# Patient Record
Sex: Female | Born: 1945 | Race: White | Hispanic: No | Smoking: Former smoker
Health system: Southern US, Community
[De-identification: ages and names within clinical notes are randomized; demographics above are authoritative.]

## PROBLEM LIST (undated history)

## (undated) DIAGNOSIS — I2510 Atherosclerotic heart disease of native coronary artery without angina pectoris: Secondary | ICD-10-CM

## (undated) DIAGNOSIS — I9789 Other postprocedural complications and disorders of the circulatory system, not elsewhere classified: Secondary | ICD-10-CM

## (undated) DIAGNOSIS — J449 Chronic obstructive pulmonary disease, unspecified: Secondary | ICD-10-CM

## (undated) DIAGNOSIS — E785 Hyperlipidemia, unspecified: Secondary | ICD-10-CM

## (undated) DIAGNOSIS — I4891 Unspecified atrial fibrillation: Secondary | ICD-10-CM

## (undated) DIAGNOSIS — M069 Rheumatoid arthritis, unspecified: Secondary | ICD-10-CM

## (undated) DIAGNOSIS — C4490 Unspecified malignant neoplasm of skin, unspecified: Secondary | ICD-10-CM

## (undated) DIAGNOSIS — K219 Gastro-esophageal reflux disease without esophagitis: Secondary | ICD-10-CM

## (undated) DIAGNOSIS — C3491 Malignant neoplasm of unspecified part of right bronchus or lung: Secondary | ICD-10-CM

## (undated) DIAGNOSIS — H269 Unspecified cataract: Secondary | ICD-10-CM

## (undated) DIAGNOSIS — C50919 Malignant neoplasm of unspecified site of unspecified female breast: Secondary | ICD-10-CM

## (undated) HISTORY — DX: Unspecified malignant neoplasm of skin, unspecified: C44.90

## (undated) HISTORY — PX: TONSILLECTOMY: SHX5217

## (undated) HISTORY — PX: CHOLECYSTECTOMY: SHX55

## (undated) HISTORY — DX: Hyperlipidemia, unspecified: E78.5

## (undated) HISTORY — DX: Unspecified cataract: H26.9

## (undated) HISTORY — DX: Malignant neoplasm of unspecified part of right bronchus or lung: C34.91

## (undated) HISTORY — DX: Chronic obstructive pulmonary disease, unspecified: J44.9

## (undated) HISTORY — DX: Malignant neoplasm of unspecified site of unspecified female breast: C50.919

## (undated) HISTORY — DX: Gastro-esophageal reflux disease without esophagitis: K21.9

## (undated) HISTORY — DX: Rheumatoid arthritis, unspecified: M06.9

## (undated) HISTORY — PX: CATARACT EXTRACTION W/ INTRAOCULAR LENS  IMPLANT, BILATERAL: SHX1307

## (undated) HISTORY — DX: Other postprocedural complications and disorders of the circulatory system, not elsewhere classified: I97.89

## (undated) HISTORY — DX: Unspecified atrial fibrillation: I48.91

## (undated) HISTORY — PX: TRANSTHORACIC ECHOCARDIOGRAM: SHX275

---

## 1997-02-28 DIAGNOSIS — C50919 Malignant neoplasm of unspecified site of unspecified female breast: Secondary | ICD-10-CM

## 1997-02-28 HISTORY — DX: Malignant neoplasm of unspecified site of unspecified female breast: C50.919

## 1997-02-28 HISTORY — PX: BREAST LUMPECTOMY: SHX2

## 1997-06-24 ENCOUNTER — Inpatient Hospital Stay (HOSPITAL_COMMUNITY): Admission: EM | Admit: 1997-06-24 | Discharge: 1997-06-30 | Payer: Self-pay | Admitting: Emergency Medicine

## 1997-07-02 ENCOUNTER — Ambulatory Visit (HOSPITAL_COMMUNITY): Admission: RE | Admit: 1997-07-02 | Discharge: 1997-07-02 | Payer: Self-pay | Admitting: General Surgery

## 1997-07-04 ENCOUNTER — Inpatient Hospital Stay (HOSPITAL_COMMUNITY): Admission: AD | Admit: 1997-07-04 | Discharge: 1997-07-05 | Payer: Self-pay

## 1997-07-10 ENCOUNTER — Ambulatory Visit (HOSPITAL_COMMUNITY): Admission: RE | Admit: 1997-07-10 | Discharge: 1997-07-10 | Payer: Self-pay | Admitting: General Surgery

## 1997-07-14 ENCOUNTER — Other Ambulatory Visit: Admission: RE | Admit: 1997-07-14 | Discharge: 1997-07-14 | Payer: Self-pay | Admitting: General Surgery

## 1997-08-13 ENCOUNTER — Ambulatory Visit (HOSPITAL_COMMUNITY): Admission: RE | Admit: 1997-08-13 | Discharge: 1997-08-13 | Payer: Self-pay | Admitting: Gastroenterology

## 1997-10-06 ENCOUNTER — Other Ambulatory Visit: Admission: RE | Admit: 1997-10-06 | Discharge: 1997-10-06 | Payer: Self-pay | Admitting: Radiology

## 1997-10-14 ENCOUNTER — Ambulatory Visit (HOSPITAL_COMMUNITY): Admission: RE | Admit: 1997-10-14 | Discharge: 1997-10-15 | Payer: Self-pay | Admitting: General Surgery

## 1997-10-14 ENCOUNTER — Encounter: Payer: Self-pay | Admitting: General Surgery

## 1997-10-20 ENCOUNTER — Encounter: Admission: RE | Admit: 1997-10-20 | Discharge: 1998-01-18 | Payer: Self-pay | Admitting: Radiation Oncology

## 1999-02-02 ENCOUNTER — Ambulatory Visit (HOSPITAL_COMMUNITY): Admission: RE | Admit: 1999-02-02 | Discharge: 1999-02-02 | Payer: Self-pay | Admitting: Gastroenterology

## 1999-02-17 ENCOUNTER — Other Ambulatory Visit: Admission: RE | Admit: 1999-02-17 | Discharge: 1999-02-17 | Payer: Self-pay | Admitting: Gynecology

## 1999-02-18 ENCOUNTER — Other Ambulatory Visit: Admission: RE | Admit: 1999-02-18 | Discharge: 1999-02-18 | Payer: Self-pay | Admitting: Gynecology

## 1999-05-04 ENCOUNTER — Other Ambulatory Visit: Admission: RE | Admit: 1999-05-04 | Discharge: 1999-05-04 | Payer: Self-pay | Admitting: Gynecology

## 2000-03-28 ENCOUNTER — Other Ambulatory Visit: Admission: RE | Admit: 2000-03-28 | Discharge: 2000-03-28 | Payer: Self-pay | Admitting: Gynecology

## 2000-04-10 ENCOUNTER — Encounter: Admission: RE | Admit: 2000-04-10 | Discharge: 2000-04-10 | Payer: Self-pay | Admitting: Oncology

## 2000-04-10 ENCOUNTER — Encounter: Payer: Self-pay | Admitting: Oncology

## 2000-04-13 ENCOUNTER — Encounter: Admission: RE | Admit: 2000-04-13 | Discharge: 2000-04-13 | Payer: Self-pay | Admitting: Oncology

## 2000-04-13 ENCOUNTER — Encounter: Payer: Self-pay | Admitting: Oncology

## 2000-04-17 ENCOUNTER — Ambulatory Visit (HOSPITAL_COMMUNITY): Admission: RE | Admit: 2000-04-17 | Discharge: 2000-04-17 | Payer: Self-pay | Admitting: Oncology

## 2000-04-17 ENCOUNTER — Encounter: Payer: Self-pay | Admitting: Oncology

## 2000-04-26 ENCOUNTER — Encounter: Admission: RE | Admit: 2000-04-26 | Discharge: 2000-06-09 | Payer: Self-pay | Admitting: Neurosurgery

## 2001-05-25 ENCOUNTER — Emergency Department (HOSPITAL_COMMUNITY): Admission: EM | Admit: 2001-05-25 | Discharge: 2001-05-25 | Payer: Self-pay | Admitting: Emergency Medicine

## 2001-06-20 ENCOUNTER — Other Ambulatory Visit: Admission: RE | Admit: 2001-06-20 | Discharge: 2001-06-20 | Payer: Self-pay | Admitting: Gynecology

## 2001-06-25 ENCOUNTER — Other Ambulatory Visit: Admission: RE | Admit: 2001-06-25 | Discharge: 2001-06-25 | Payer: Self-pay | Admitting: Radiology

## 2002-08-24 ENCOUNTER — Emergency Department (HOSPITAL_COMMUNITY): Admission: AD | Admit: 2002-08-24 | Discharge: 2002-08-24 | Payer: Self-pay | Admitting: Emergency Medicine

## 2002-08-24 ENCOUNTER — Encounter: Payer: Self-pay | Admitting: Emergency Medicine

## 2004-02-19 ENCOUNTER — Emergency Department (HOSPITAL_COMMUNITY): Admission: EM | Admit: 2004-02-19 | Discharge: 2004-02-19 | Payer: Self-pay | Admitting: Emergency Medicine

## 2005-02-08 ENCOUNTER — Other Ambulatory Visit: Admission: RE | Admit: 2005-02-08 | Discharge: 2005-02-08 | Payer: Self-pay | Admitting: Gynecology

## 2006-02-05 ENCOUNTER — Emergency Department: Payer: Self-pay | Admitting: Emergency Medicine

## 2006-03-17 ENCOUNTER — Ambulatory Visit (HOSPITAL_COMMUNITY): Admission: AD | Admit: 2006-03-17 | Discharge: 2006-03-17 | Payer: Self-pay | Admitting: Gynecology

## 2006-03-17 ENCOUNTER — Encounter (INDEPENDENT_AMBULATORY_CARE_PROVIDER_SITE_OTHER): Payer: Self-pay | Admitting: Specialist

## 2006-04-27 ENCOUNTER — Other Ambulatory Visit: Admission: RE | Admit: 2006-04-27 | Discharge: 2006-04-27 | Payer: Self-pay | Admitting: Gynecology

## 2008-02-08 ENCOUNTER — Ambulatory Visit: Payer: Self-pay | Admitting: Family Medicine

## 2008-02-08 DIAGNOSIS — C50919 Malignant neoplasm of unspecified site of unspecified female breast: Secondary | ICD-10-CM | POA: Insufficient documentation

## 2008-02-08 DIAGNOSIS — M069 Rheumatoid arthritis, unspecified: Secondary | ICD-10-CM | POA: Insufficient documentation

## 2008-02-08 DIAGNOSIS — R5381 Other malaise: Secondary | ICD-10-CM | POA: Insufficient documentation

## 2008-02-08 DIAGNOSIS — E785 Hyperlipidemia, unspecified: Secondary | ICD-10-CM | POA: Insufficient documentation

## 2008-02-08 DIAGNOSIS — F17200 Nicotine dependence, unspecified, uncomplicated: Secondary | ICD-10-CM | POA: Insufficient documentation

## 2008-02-08 DIAGNOSIS — R5383 Other fatigue: Secondary | ICD-10-CM

## 2008-02-08 DIAGNOSIS — R0789 Other chest pain: Secondary | ICD-10-CM | POA: Insufficient documentation

## 2008-02-08 LAB — HM SIGMOIDOSCOPY

## 2008-02-13 DIAGNOSIS — E559 Vitamin D deficiency, unspecified: Secondary | ICD-10-CM | POA: Insufficient documentation

## 2008-03-07 ENCOUNTER — Encounter: Payer: Self-pay | Admitting: Family Medicine

## 2008-03-07 ENCOUNTER — Ambulatory Visit: Payer: Self-pay | Admitting: Family Medicine

## 2008-03-07 ENCOUNTER — Other Ambulatory Visit: Admission: RE | Admit: 2008-03-07 | Discharge: 2008-03-07 | Payer: Self-pay | Admitting: Family Medicine

## 2008-03-07 LAB — HM PAP SMEAR

## 2008-03-10 LAB — CONVERTED CEMR LAB: Vit D, 1,25-Dihydroxy: 28 — ABNORMAL LOW (ref 30–89)

## 2008-03-11 ENCOUNTER — Encounter (INDEPENDENT_AMBULATORY_CARE_PROVIDER_SITE_OTHER): Payer: Self-pay | Admitting: *Deleted

## 2008-05-14 ENCOUNTER — Encounter (INDEPENDENT_AMBULATORY_CARE_PROVIDER_SITE_OTHER): Payer: Self-pay | Admitting: Internal Medicine

## 2008-05-14 ENCOUNTER — Ambulatory Visit: Payer: Self-pay | Admitting: Family Medicine

## 2008-05-14 DIAGNOSIS — R062 Wheezing: Secondary | ICD-10-CM | POA: Insufficient documentation

## 2008-05-16 ENCOUNTER — Ambulatory Visit: Payer: Self-pay | Admitting: Family Medicine

## 2008-05-22 ENCOUNTER — Ambulatory Visit: Payer: Self-pay | Admitting: Family Medicine

## 2008-12-17 ENCOUNTER — Encounter: Payer: Self-pay | Admitting: Family Medicine

## 2008-12-17 ENCOUNTER — Ambulatory Visit: Payer: Self-pay | Admitting: Family Medicine

## 2008-12-17 DIAGNOSIS — R631 Polydipsia: Secondary | ICD-10-CM | POA: Insufficient documentation

## 2008-12-19 LAB — CONVERTED CEMR LAB
BUN: 11 mg/dL (ref 6–23)
CO2: 30 meq/L (ref 19–32)
Calcium: 8.7 mg/dL (ref 8.4–10.5)
Chloride: 105 meq/L (ref 96–112)
Creatinine, Ser: 0.6 mg/dL (ref 0.4–1.2)

## 2010-03-02 ENCOUNTER — Ambulatory Visit
Admission: RE | Admit: 2010-03-02 | Discharge: 2010-03-02 | Payer: Self-pay | Source: Home / Self Care | Attending: Family Medicine | Admitting: Family Medicine

## 2010-03-02 DIAGNOSIS — J019 Acute sinusitis, unspecified: Secondary | ICD-10-CM | POA: Insufficient documentation

## 2010-03-28 LAB — CONVERTED CEMR LAB
ALT: 19 units/L (ref 0–35)
AST: 23 units/L (ref 0–37)
Albumin: 3.9 g/dL (ref 3.5–5.2)
Alkaline Phosphatase: 59 units/L (ref 39–117)
BUN: 12 mg/dL (ref 6–23)
Basophils Absolute: 0.1 10*3/uL (ref 0.0–0.1)
Basophils Relative: 1 % (ref 0.0–3.0)
Bilirubin, Direct: 0.1 mg/dL (ref 0.0–0.3)
CO2: 29 meq/L (ref 19–32)
Calcium: 9.2 mg/dL (ref 8.4–10.5)
Chloride: 101 meq/L (ref 96–112)
Cholesterol: 211 mg/dL (ref 0–200)
Creatinine, Ser: 0.7 mg/dL (ref 0.4–1.2)
Direct LDL: 119.4 mg/dL
Eosinophils Absolute: 0.2 10*3/uL (ref 0.0–0.7)
Eosinophils Relative: 2.1 % (ref 0.0–5.0)
Folate: 10.3 ng/mL
GFR calc Af Amer: 109 mL/min
GFR calc non Af Amer: 90 mL/min
Glucose, Bld: 95 mg/dL (ref 70–99)
HCT: 42.5 % (ref 36.0–46.0)
HDL: 61.5 mg/dL (ref >39.0)
Hemoglobin: 15.1 g/dL — ABNORMAL HIGH (ref 12.0–15.0)
Lymphocytes Relative: 29 % (ref 12.0–46.0)
MCHC: 35.5 g/dL (ref 30.0–36.0)
MCV: 96.5 fL (ref 78.0–100.0)
Monocytes Absolute: 0.7 10*3/uL (ref 0.1–1.0)
Monocytes Relative: 7.7 % (ref 3.0–12.0)
Neutro Abs: 5.1 10*3/uL (ref 1.4–7.7)
Neutrophils Relative %: 60.2 % (ref 43.0–77.0)
Platelets: 284 10*3/uL (ref 150–400)
Potassium: 4.7 meq/L (ref 3.5–5.1)
RBC: 4.4 M/uL (ref 3.87–5.11)
RDW: 12.2 % (ref 11.5–14.6)
Sodium: 137 meq/L (ref 135–145)
TSH: 1.62 microintl units/mL (ref 0.35–5.50)
Total Bilirubin: 0.9 mg/dL (ref 0.3–1.2)
Total CHOL/HDL Ratio: 3.4
Total Protein: 6.6 g/dL (ref 6.0–8.3)
Triglycerides: 129 mg/dL (ref 0–149)
VLDL: 26 mg/dL (ref 0–40)
Vit D, 1,25-Dihydroxy: 15 — ABNORMAL LOW (ref 30–89)
Vitamin B-12: 498 pg/mL (ref 211–911)
WBC: 8.6 10*3/uL (ref 4.5–10.5)

## 2010-04-01 NOTE — Assessment & Plan Note (Signed)
Summary: CHEST CONGESTION.COUGH/RBH   Vital Signs:  Patient profile:   65 year old female Height:      66 inches Weight:      196.50 pounds BMI:     31.83 Temp:     97.7 degrees F oral Pulse rate:   80 / minute Pulse rhythm:   regular BP sitting:   130 / 68  (left arm) Cuff size:   large  Vitals Entered By: Delilah Shan CMA Emmalena Canny Dull) (March 02, 2010 3:04 PM) CC: Chest congestion, Back Pain   History of Present Illness: ST and knots noted in neck.  Cough.  Ear pain and ringing.  Everything going on for about 4 weeks.  "I did every OTC med known to man, but it isn't getting better."  Nighttime is worse for the patient.  Rhinorrhea.  Off advair.  Was not wheezing before recent illness.  Now with some mild shortness of breath that came on with the illness.  Occ sputum.  No fevers.  Prev used SABA, but hasn't had this recently.     Allergies: 1)  ! Penicillin 2)  ! Chantix  Past History:  Past Medical History: Last updated: 02/08/2008 Rheumatoid arthritis, well controlled on no medication. Hyperlipidemia  Social History: Last updated: 03/02/2010 Occupation:retail management Divorced 2 children: healthy Current Smoker 1/2 pack a day for 30 years Alcohol use-yes, wine 1 glass severa times a week Drug use-no Regular exercise-yes, walks 2 times a week Diet: fruits and veggies, avoids fast food  Social History: Occupation:retail management Divorced 2 children: healthy Current Smoker 1/2 pack a day for 30 years Alcohol use-yes, wine 1 glass severa times a week Drug use-no Regular exercise-yes, walks 2 times a week Diet: fruits and veggies, avoids fast food  Review of Systems       See HPI.  Otherwise negative.    Physical Exam  General:  GEN: nad, alert and oriented HEENT: mucous membranes moist, TM w/o erythema, nasal epithelium injected, OP with cobblestoning, frontal and max sinus tender to palpation bilaterally NECK: supple with shotty LA CV: rrr. PULM: ctab,  no inc wob, no wheeze ABD: soft, +bs EXT: no edema    Impression & Recommendations:  Problem # 1:  COPD (ICD-496) Start back on advair and SABA as needed.  D/w patient re: long term control vs. short term relief and she understood.  Can follow up with PMD.  d/w patient about tobacco.  The following medications were removed from the medication list:    Advair Diskus 100-50 Mcg/dose Misc (Fluticasone-salmeterol) .Marland Kitchen... 1 puff 2 times daily Her updated medication list for this problem includes:    Advair Diskus 250-50 Mcg/dose Aepb (Fluticasone-salmeterol) .Marland Kitchen... 1 puff two times a day and rinse after use    Ventolin Hfa 108 (90 Base) Mcg/act Aers (Albuterol sulfate) .Marland Kitchen... 2 puffs every 4 hours as needed for cough/wheeze  Problem # 2:  SINUSITIS - ACUTE-NOS (ICD-461.9) Start zmax and follow up as needed.  Supporitve tx o/w. Nontoxic.  She agrees . Her updated medication list for this problem includes:    Zithromax 250 Mg Tabs (Azithromycin) .Marland Kitchen... 2 by mouth today and then 1 by mouth once daily for 4 days.  Orders: Prescription Created Electronically [phone])  Complete Medication List: 1)  Multivitamins Tabs (Multiple vitamin) .... Take 1 tablet by mouth once a day 2)  Fish Oil Oil (Fish oil) .... Once daily 3)  Advair Diskus 250-50 Mcg/dose Aepb (Fluticasone-salmeterol) .Marland Kitchen.. 1 puff two times a day and  rinse after use 4)  Ventolin Hfa 108 (90 Base) Mcg/act Aers (Albuterol sulfate) .... 2 puffs every 4 hours as needed for cough/wheeze 5)  Zithromax 250 Mg Tabs (Azithromycin) .... 2 by mouth today and then 1 by mouth once daily for 4 days.  Patient Instructions: 1)  Get plenty of rest, drink lots of clear liquids, and use Tylenol or Ibuprofen for fever and comfort. Start the antibiotics today and use the inhalers as directed.  I would think about getting a mammogram.  You can talk with Dr. Ermalene Searing about this.   2)  Let us know if you aren't getting better.  Take care.   Prescriptions: ZITHROMAX 250 MG TABS (AZITHROMYCIN) 2 by mouth today and then 1 by mouth once daily for 4 days.  #6 x 0   Entered and Authorized by:   Crawford Givens MD   Signed by:   Crawford Givens MD on 03/02/2010   Method used:   Electronically to        [id number] (retail)       [address]       [place], [place]  [address]       Ph: [phone] or [phone]       Fax: [phone]   RxID:   [id number] VENTOLIN HFA 108 (90 BASE) MCG/ACT AERS (ALBUTEROL SULFATE) 2 puffs every 4 hours as needed for cough/wheeze  #1 x 5   Entered and Authorized by:   Crawford Givens MD   Signed by:   Crawford Givens MD on 03/02/2010   Method used:   [facility] to        [facility] (retail)       [address]       [place], [place]  [address]       Ph: [phone] or [phone]       Fax: [phone]   RxID:   [id number] ADVAIR DISKUS 250-50 MCG/DOSE AEPB (FLUTICASONE-SALMETEROL) 1 puff two times a day and rinse after use  #1 x 5   Entered and Authorized by:   Crawford Givens MD   Signed by:   Crawford Givens MD on 03/02/2010   Method used:   [facility]        [facility] (retail)       [address]       [place], [place]  [address]       Ph: [phone] or [phone]       Fax: [phone]   RxID:   [id number]    Orders Added: 1)  Prescription Created Electronically [G8553] 2)  Est. Patient Level III [99371]    Current Allergies (reviewed today): ! PENICILLIN ! CHANTIX

## 2010-07-16 NOTE — H&P (Signed)
Hannah Green, HELBLING NO.:  [id number]   MEDICAL RECORD NO.:  [id number]          PATIENT TYPE:  AMB   LOCATION:  [facility]                          [facility]ILITY:  [facility]   PHYSICIAN:  Juan H. Lily Peer, M.D.DATE OF BIRTH:  1945/10/15   DATE OF ADMISSION:  03/17/2006  DATE OF DISCHARGE:                              HISTORY & PHYSICAL   CHIEF COMPLAINT:  Post menopausal vaginal bleeding.   HISTORY:  The patient is a 65 year old, gravida 2, para 2, who presented  to the office today complaining of vaginal bleeding which started  Monday.  The patient had not been sexually active and had intercourse on  Monday and started to bleed and has been passing large blood clots since  that time.  On examination in the office, it appears she has a posterior  vaginal fornix laceration extending from the 3 o'clock to the 9 o'clock  position and it was evident that it was bleeding.  Bimanual examination:  Uterus upper limits of normal.  No palpable masses or tenderness.  The  patient does have a history of right breast lumpectomy, stage I invasive  ductal carcinoma, where she had radiation and had been on tamoxifen back  in 1999.  She is currently on no medication, except calcium with vitamin  D, and she has not eaten since early this morning.   PHYSICAL EXAMINATION:  VITAL SIGNS:  The patient weighs 186 pounds,  blood pressure 120/78.  HEENT:  Unremarkable.  NECK:  Supple.  Trachea midline.  No carotid bruits.  No thyromegaly.  LUNGS:  Clear to auscultation without rhonchi or wheezes.  HEART:  Regular rate and rhythm.  No murmurs or gallops.  BREAST:  Not done.  ABDOMEN:  Soft, nontender with rebound or guarding.  PELVIC:  Bartholin, urethral, and Skene within normal limits.  Vagina  and cervix, posterior vaginal wall laceration extending from the 3 to  the 9 o'clock position.  Bimanual examination unremarkable.   ALLERGIES:  THE PATIENT IS ALLERGIC TO PENICILLIN.   MEDICATIONS:   Calcium and vitamin D.   The patient is a smoker, one pack per week.  She does have a history of  arthritis and has had a history of right breast lumpectomy, stage I  invasive ductal carcinoma (radiation/tamoxifen) in 1999 and she also had  a cholecystectomy in 1999, as well.   ASSESSMENT:  A 65 year old, gravida 2, para 2, post traumatic  intercourse early part of this week who has continued bleeding.  Patient  with apparent posterior vaginal wall/fornix laceration from 3 to 9  o'clock position.   She will be taken to the operating room where under intravenous sedation  and local the laceration will be repaired.  Risks, benefits, and pros  and cons were discussed with the patient.  All questions were answered  and will follow accordingly.      Juan H. Lily Peer, M.D.  Electronically Signed     JHF/MEDQ  D:  03/17/2006  T:  03/17/2006  Job:  [id number]   cc:   [facility]  Fax: [phone]

## 2010-07-16 NOTE — Procedures (Signed)
[place]  Patient:    Hannah Green                MRN: [id number] Proc. Date: 02/02/99 Adm. Date:  [id number] Attending:  Charna Elizabeth CC:         Gaetano Hawthorne. Lily Peer, M.D.                           Procedure Report  DATE OF BIRTH:  1945/10/07  REFERRING PHYSICIAN:  Gaetano Hawthorne. Lily Peer, M.D.  PROCEDURE PERFORMED:  Colonoscopy.  ENDOSCOPIST:  Anselmo Rod, M.D.  INSTRUMENT USED:  Olympus video colonoscope.  INDICATIONS:  Rectal bleeding and personal history for breast cancer in a 65 year old white female.  Rule out polyps, AVMs, masses, hemorrhoids, etc.  PREPROCEDURE PREPARATION:  Informed consent was procured from the patient.  The  patient was fasted for 8 hours prior to the procedure and prepped with a bottle of magnesium citrate and a gallon of NuLytely the night prior to the procedure.  PREPROCEDURE PHYSICAL:  Patient has stable vital signs.  NECK:  Supple.  CHEST:  Clear to auscultation. S1, S2 regular.  ABDOMEN:  Soft with normal abdominal bowel sounds. No hepatosplenomegaly, no masses palpable.  DESCRIPTION OF PROCEDURE:  The patient was placed in left lateral decubitus position and sedated with 60 mg of Demerol and 7 mg of Versed intravenously. Once the patient was adequately sedated and maintained on low-flow oxygen and continuous cardiac monitoring, the Olympus video colonoscope was advanced from the rectum o the cecum with slight difficulty secondary to some residual stool in the colon.  Left-sided diverticula were seen.  The transverse colon, right colon, cecum and  terminal ileum appeared healthy and normal with normal vascular structure. There were moderate-sized internal hemorrhoids seen on retroflexion in the rectum. The patient tolerated the procedure well without complication.  IMPRESSION: 1. Moderate-sized nonbleeding internal hemorrhoids. 2. Left-sided diverticular disease. 3.  Normal-appearing transverse colon, right colon, cecum and terminal ileum.  RECOMMENDATIONS: 1. A high-fiber diet has been recommended for the patient. 2. Stool softener should be used as needed. 3. Repeat colorectal cancer screening is recommended in the next five years unless    the patient develops any problems in the interim. DD:  02/02/99 TD:  02/03/99 Job: [id number]

## 2010-07-16 NOTE — Op Note (Signed)
NAMEKLAIR, Hannah Green NO.:  [id number]   MEDICAL RECORD NO.:  [id number]          PATIENT TYPE:  AMB   LOCATION:  [facility]                          [facility]ILITY:  [facility]   PHYSICIAN:  Juan H. Lily Peer, M.D.DATE OF BIRTH:  [birth year]   DATE OF PROCEDURE:  03/17/2006  DATE OF DISCHARGE:                               OPERATIVE REPORT   SURGEON:  Juan H. Lily Peer, MD.   INDICATION FOR OPERATION:  A 65 year old, gravida 2, para 2, with  postmenopausal bleeding.  On examination in the office, it did appear  that the patient had a posterior vaginal fornix laceration from the 3 to  the 9 o'clock position adjacent to the cervix posttraumatic after  intercourse.   PREOPERATIVE DIAGNOSES:  1. Postmenopausal bleeding.  2. Cervicovaginal laceration.   POSTOPERATIVE DIAGNOSES:  1. Postmenopausal bleeding.  2. Cervicovaginal laceration.   ANESTHESIA:  MAC and intravenous sedation and local 1% Lidocaine.   PROCEDURE PERFORMED:  1. Repair of cervicovaginal laceration.  2. Endometrial biopsy.   DESCRIPTION OF OPERATION:  After the patient was adequately counseled,  she was taken to the operating room where she was given intravenous  sedation, and she was placed in the high-lithotomy position.  The  vaginal area was cleansed with Betadine solution and then irrigated with  normal saline solution.  The cervicovaginal laceration extending from  the 3 to the 9 o'clock position was evident.  After the area was  completely debrided with irrigation solution, it appeared that the  cervicovaginal laceration was deep, but there was no penetration into  the peritoneal cavity and on rectal examination appeared to be intact.  After this, the cervicovaginal laceration was repaired with a running,  locking stitch of #0 Vicryl suture.  The area was once again copiously  irrigated with normal saline solution, and Estrace cream was applied to  the area.  Of note, she had had an in-and-out  catheterization of her  bladder before the procedure for approximately 50 ml, and the patient  received 800 ml of IV fluids and received a gram of Cefoxitin IV.  The  patient was transferred to the recovery room with stable vital signs.  She did receive 10 ml of local Lidocaine.      Juan H. Lily Peer, M.D.  Electronically Signed    JHF/MEDQ  D:  03/17/2006  T:  03/17/2006  Job:  [id number]

## 2010-11-15 ENCOUNTER — Ambulatory Visit (INDEPENDENT_AMBULATORY_CARE_PROVIDER_SITE_OTHER): Payer: MEDICARE | Admitting: Internal Medicine

## 2010-11-15 ENCOUNTER — Encounter: Payer: Self-pay | Admitting: Internal Medicine

## 2010-11-15 DIAGNOSIS — J019 Acute sinusitis, unspecified: Secondary | ICD-10-CM

## 2010-11-15 DIAGNOSIS — J449 Chronic obstructive pulmonary disease, unspecified: Secondary | ICD-10-CM

## 2010-11-15 MED ORDER — ALBUTEROL SULFATE HFA 108 (90 BASE) MCG/ACT IN AERS: 2.0000 | INHALATION_SPRAY | Freq: Four times a day (QID) | RESPIRATORY_TRACT | Status: DC | PRN

## 2010-11-15 MED ORDER — CEFUROXIME AXETIL 250 MG PO TABS: 250.0000 mg | ORAL_TABLET | Freq: Two times a day (BID) | ORAL | Status: AC

## 2010-11-15 NOTE — Progress Notes (Signed)
  Subjective:    Patient ID: Hannah Green, female    DOB: May 25, 1945, 65 y.o.   MRN: [id number]  HPI Has been sick for 3 weeks Started with rhinorrhea and cough--thought it was a cold or allergies at first Now worsening breathing No fever Some sweats mostly at night  Cough is productive of green chunky mucus Some right ear pain--both are popping Has sinus congestion  OTC mucinex, ibuprofen, sudafed not really helping  No current outpatient prescriptions on file prior to visit.    Allergies  Allergen Reactions  . Penicillins     REACTION: As a child  . Varenicline Tartrate     REACTION: intolerant    Past Medical History  Diagnosis Date  . Hyperlipidemia   . Rheumatoid arthritis     Past Surgical History  Procedure Date  . Cholecystectomy   . Tonsillectomy   . Breast lumpectomy 1999    left, needed radiation    Family History  Problem Relation Age of Onset  . Cancer Mother     breast  . Cancer Father     lung cancer  . Cancer Sister     throat cancer  . Diabetes Paternal Uncle   . Diabetes Maternal Grandmother     History   Social History  . Marital Status: Divorced    Spouse Name: N/A    Number of Children: 2  . Years of Education: N/A   Occupational History  . MANAGER     retail   Social History Main Topics  . Smoking status: Former Smoker    Quit date: 03/31/2010  . Smokeless tobacco: Never Used  . Alcohol Use: Yes  . Drug Use: No  . Sexually Active: Not on file   Other Topics Concern  . Not on file   Social History Narrative  . No narrative on file   Review of Systems No rash No vomiting or diarrhea (but stools are looser lately) Appetite is okay     Objective:   Physical Exam  Constitutional: She appears well-developed and well-nourished. No distress.  HENT:       Mild maxillary tenderness but no frontal Moderate nasal inflammation Slight pharyngeal injection 1 red macule on tongue  Neck: Normal range of motion. Neck  supple.  Pulmonary/Chest: Effort normal and breath sounds normal. No respiratory distress. She has no rales.       Very slight exp wheeze but not really tight  Lymphadenopathy:    She has no cervical adenopathy.          Assessment & Plan:

## 2010-11-15 NOTE — Assessment & Plan Note (Signed)
Very slight wheezing but doesn't seem to have a true exacerbation Will just continue the advair Will Rx for albuterol rescue

## 2010-11-15 NOTE — Assessment & Plan Note (Signed)
Has been sick for 3 weeks Doesn't seem to have lower resp infection Will treat with cefuroxime (only had vague rash with pcn as a child)

## 2010-11-22 LAB — HM COLONOSCOPY: HM Colonoscopy: NORMAL

## 2011-01-06 ENCOUNTER — Encounter: Payer: Self-pay | Admitting: Family Medicine

## 2011-03-23 ENCOUNTER — Encounter: Payer: MEDICARE | Admitting: Internal Medicine

## 2011-06-02 ENCOUNTER — Ambulatory Visit (INDEPENDENT_AMBULATORY_CARE_PROVIDER_SITE_OTHER): Payer: MEDICARE | Admitting: Internal Medicine

## 2011-06-02 ENCOUNTER — Encounter: Payer: Self-pay | Admitting: Internal Medicine

## 2011-06-02 ENCOUNTER — Other Ambulatory Visit (HOSPITAL_COMMUNITY)
Admission: RE | Admit: 2011-06-02 | Discharge: 2011-06-02 | Disposition: A | Discharge status: Home / Self Care | Payer: MEDICARE | Source: Ambulatory Visit | Attending: Internal Medicine | Admitting: Internal Medicine

## 2011-06-02 VITALS — BP 118/68 | HR 81 | Temp 98.2°F | Ht 65.0 in | Wt 189.0 lb

## 2011-06-02 DIAGNOSIS — E785 Hyperlipidemia, unspecified: Secondary | ICD-10-CM

## 2011-06-02 DIAGNOSIS — Z0000 Encounter for general adult medical examination without abnormal findings: Secondary | ICD-10-CM

## 2011-06-02 DIAGNOSIS — K219 Gastro-esophageal reflux disease without esophagitis: Secondary | ICD-10-CM

## 2011-06-02 DIAGNOSIS — J4489 Other specified chronic obstructive pulmonary disease: Secondary | ICD-10-CM

## 2011-06-02 DIAGNOSIS — H9190 Unspecified hearing loss, unspecified ear: Secondary | ICD-10-CM

## 2011-06-02 DIAGNOSIS — J449 Chronic obstructive pulmonary disease, unspecified: Secondary | ICD-10-CM

## 2011-06-02 DIAGNOSIS — Z0100 Encounter for examination of eyes and vision without abnormal findings: Secondary | ICD-10-CM

## 2011-06-02 DIAGNOSIS — R0789 Other chest pain: Secondary | ICD-10-CM

## 2011-06-02 DIAGNOSIS — Z124 Encounter for screening for malignant neoplasm of cervix: Secondary | ICD-10-CM | POA: Insufficient documentation

## 2011-06-02 DIAGNOSIS — Z23 Encounter for immunization: Secondary | ICD-10-CM

## 2011-06-02 MED ORDER — FLUTICASONE-SALMETEROL 250-50 MCG/DOSE IN AEPB: 1.0000 | INHALATION_SPRAY | Freq: Two times a day (BID) | RESPIRATORY_TRACT | Status: DC

## 2011-06-02 NOTE — Patient Instructions (Signed)
Please execute a [facility] health care power of attorney form

## 2011-06-02 NOTE — Assessment & Plan Note (Signed)
Pap done---last one if normal Mammo due later this year Discussed fitness

## 2011-06-02 NOTE — Assessment & Plan Note (Signed)
Doesn't really sound cardiac Some palpitations  EKG not worrisome Will just check labs

## 2011-06-02 NOTE — Assessment & Plan Note (Signed)
Regular symptoms Recommended regular use of ranitidine

## 2011-06-02 NOTE — Assessment & Plan Note (Addendum)
Does okay with current meds Discussed cigarette cessation again

## 2011-06-02 NOTE — Progress Notes (Signed)
Subjective:    Patient ID: Hannah Green, female    DOB: Dec 23, 1945, 66 y.o.   MRN: [id number]  HPI Here for physical Last mammo 6/12 Last Pap 2009  Constant ringing in ears. Popping and crackling in right ear.  Decreased hearing in right---stable  Struggles with weight No set dietary plan---but avoids fried foods and junk foods Exercises at [employer] 3 times per week. Now has silver sneakers  Back to smoking Discussed patches and lozenges  Current Outpatient Prescriptions on File Prior to Visit  Medication Sig Dispense Refill  . albuterol (PROVENTIL HFA;VENTOLIN HFA) 108 (90 BASE) MCG/ACT inhaler Inhale 2 puffs into the lungs every 6 (six) hours as needed for wheezing.  1 Inhaler  1  . Fluticasone-Salmeterol (ADVAIR) 250-50 MCG/DOSE AEPB Inhale 1 puff into the lungs every 12 (twelve) hours.          Allergies  Allergen Reactions  . Varenicline Tartrate     REACTION: intolerant  . Penicillins     REACTION: As a child Vague rash only    Past Medical History  Diagnosis Date  . Hyperlipidemia   . Rheumatoid arthritis     Past Surgical History  Procedure Date  . Cholecystectomy   . Tonsillectomy   . Breast lumpectomy 1999    left, needed radiation    Family History  Problem Relation Age of Onset  . Cancer Mother     breast  . Cancer Father     lung cancer  . Cancer Sister     throat cancer  . Diabetes Paternal Uncle   . Diabetes Maternal Grandmother     History   Social History  . Marital Status: Divorced    Spouse Name: N/A    Number of Children: 2  . Years of Education: N/A   Occupational History  . Retired Forensic psychologist   Social History Main Topics  . Smoking status: Current Everyday Smoker    Last Attempt to Quit: 03/31/2010  . Smokeless tobacco: Never Used   Comment: has stopped but has started again in "crisis"  . Alcohol Use: Yes  . Drug Use: No  . Sexually Active: Not on file   Other Topics Concern  . Not on file    Social History Narrative   Lives aloneNo living [place] friend Meriel Flavors to make health care decisions.Would accept resuscitation but no prolonged ventilationWould not want tube feeds if cognitively unaware   Review of Systems  Constitutional: Negative for fatigue and unexpected weight change.       Wears seat belt  HENT: Positive for hearing loss, congestion, rhinorrhea and tinnitus. Negative for dental problem.        Mild seasonal allergy symptoms---no meds Regular with dentist  Eyes: Negative for visual disturbance.       No diplopia or unilateral vision loss Regular exams   Respiratory: Positive for wheezing. Negative for cough, chest tightness and shortness of breath.        Uses albuterol intermittently---more in allergy season  Cardiovascular: Positive for chest pain and palpitations. Negative for leg swelling.       Occ sharp pain in chest --when sitting No exertional symptoms Notes heart going fast sometimes---better with deep breaths  Gastrointestinal: Positive for constipation. Negative for nausea, vomiting, abdominal pain and blood in stool.       Fairly constant heartburn---uses tums regularly  Genitourinary: Negative for dysuria, urgency, difficulty urinating and dyspareunia.  Musculoskeletal: Positive for arthralgias. Negative for  back pain and joint swelling.       Mostly right CMC joint--no meds  Skin: Negative for rash.       Recent exam and carcinoma removal on face by Dr Gwen Pounds  Neurological: Negative for dizziness, syncope, weakness, light-headedness, numbness and headaches.  Hematological: Negative for adenopathy. Does not bruise/bleed easily.  Psychiatric/Behavioral: Negative for sleep disturbance and dysphoric mood. The patient is not nervous/anxious.        Objective:   Physical Exam  Constitutional: She is oriented to person, place, and time. She appears well-developed and well-nourished. No distress.  HENT:  Head: Normocephalic and  atraumatic.  Right Ear: External ear normal.  Left Ear: External ear normal.  Mouth/Throat: Oropharynx is clear and moist. No oropharyngeal exudate.  Eyes: Conjunctivae and EOM are normal. Pupils are equal, round, and reactive to light.  Neck: Normal range of motion. Neck supple. No thyromegaly present.  Cardiovascular: Normal rate, regular rhythm, normal heart sounds and intact distal pulses.  Exam reveals no gallop.   No murmur heard. Pulmonary/Chest: Effort normal and breath sounds normal. No respiratory distress. She has no wheezes. She has no rales.  Abdominal: Soft. There is no tenderness. There is no rebound and no guarding.  Genitourinary:       Mild cystic changes in breasts--more on left  Normal introitus Cervix embedded in vaginal wall--appears nornmal Pap done Bimanual benign  Musculoskeletal: She exhibits no edema and no tenderness.  Lymphadenopathy:    She has no cervical adenopathy.    She has no axillary adenopathy.  Neurological: She is alert and oriented to person, place, and time.  Skin: No rash noted. No erythema.  Psychiatric: She has a normal mood and affect. Her behavior is normal. Judgment and thought content normal.          Assessment & Plan:

## 2011-06-02 NOTE — Progress Notes (Signed)
Addended by: Sueanne Margarita on: 06/02/2011 04:05 PM   Modules accepted: Orders

## 2011-06-03 LAB — BASIC METABOLIC PANEL
CO2: 28 mEq/L (ref 19–32)
Calcium: 9.5 mg/dL (ref 8.4–10.5)
Chloride: 103 mEq/L (ref 96–112)
Potassium: 5 mEq/L (ref 3.5–5.1)
Sodium: 140 mEq/L (ref 135–145)

## 2011-06-03 LAB — CBC WITH DIFFERENTIAL/PLATELET
Basophils Relative: 0.3 % (ref 0.0–3.0)
Eosinophils Absolute: 0.1 10*3/uL (ref 0.0–0.7)
HCT: 42.7 % (ref 36.0–46.0)
Hemoglobin: 14.2 g/dL (ref 12.0–15.0)
Lymphocytes Relative: 33.6 % (ref 12.0–46.0)
Lymphs Abs: 3 10*3/uL (ref 0.7–4.0)
MCHC: 33.4 g/dL (ref 30.0–36.0)
Neutro Abs: 5.2 10*3/uL (ref 1.4–7.7)
RBC: 4.43 Mil/uL (ref 3.87–5.11)

## 2011-06-03 LAB — HEPATIC FUNCTION PANEL
ALT: 17 U/L (ref 0–35)
Alkaline Phosphatase: 57 U/L (ref 39–117)
Bilirubin, Direct: 0.1 mg/dL (ref 0.0–0.3)
Total Protein: 7.1 g/dL (ref 6.0–8.3)

## 2011-06-03 LAB — LIPID PANEL
Total CHOL/HDL Ratio: 4
Triglycerides: 105 mg/dL (ref 0.0–149.0)

## 2011-06-06 ENCOUNTER — Encounter: Payer: Self-pay | Admitting: *Deleted

## 2011-06-07 ENCOUNTER — Encounter: Payer: Self-pay | Admitting: *Deleted

## 2011-06-13 ENCOUNTER — Encounter: Payer: Self-pay | Admitting: *Deleted

## 2011-10-10 ENCOUNTER — Telehealth: Payer: Self-pay | Admitting: Family Medicine

## 2011-10-10 NOTE — Telephone Encounter (Signed)
Caller: Brindy/Patient; Patient Name: Hannah Green; PCP: Tillman Abide; Best Callback Phone Number: [phone] Is going on cruise to [place] 10-1 and is wanting to get script for motion sickness patch. Also did yard work 8-6 and got into poison ivy. Is on both arms "a little above elbow". Afebrile. Has used anti itch ointment but has not helped. Is not improving. Is red around area. Afebrile. Per Poison Ivy protocol, appointment scheduled for 8-13 at 0815 with Dr. Ermalene Searing.

## 2011-10-11 ENCOUNTER — Encounter: Payer: Self-pay | Admitting: Family Medicine

## 2011-10-11 ENCOUNTER — Ambulatory Visit (INDEPENDENT_AMBULATORY_CARE_PROVIDER_SITE_OTHER): Payer: MEDICARE | Admitting: Family Medicine

## 2011-10-11 VITALS — BP 120/72 | HR 96 | Temp 98.7°F | Ht 65.0 in | Wt 180.8 lb

## 2011-10-11 DIAGNOSIS — L255 Unspecified contact dermatitis due to plants, except food: Secondary | ICD-10-CM

## 2011-10-11 MED ORDER — METHYLPREDNISOLONE ACETATE 40 MG/ML IJ SUSP
80.0000 mg | Freq: Once | INTRAMUSCULAR | Status: AC
  Administered 2011-10-11: 80 mg via INTRAMUSCULAR

## 2011-10-11 NOTE — Patient Instructions (Addendum)
Can use topical OTC till rash resolved. Call if improvement not as great as expected.for oral course of prednisone.  Go to ER if difficulty swallowing or breathing.

## 2011-10-11 NOTE — Assessment & Plan Note (Signed)
Treat with steroid injection. Can use topical OTC till rash resolved. Call if improvement not as great as expected.for oral course of prednisone.

## 2011-10-11 NOTE — Progress Notes (Signed)
  Subjective:    Patient ID: Hannah Green, female    DOB: Jun 07, 1945, 66 y.o.   MRN: [id number]  Rash This is a new problem. The current episode started in the past 7 days. The problem has been gradually worsening since onset. The affected locations include the left arm, right arm and torso. The rash is characterized by blistering and itchiness. She was exposed to plant contact (occured after yard work). Pertinent negatives include no congestion, cough, facial edema, fatigue or shortness of breath. (Has ulcer in inside of lip, bottom lip feels swollen.) Past treatments include anti-itch cream. The treatment provided mild relief. There is no history of allergies, asthma, eczema or varicella.      Review of Systems  Constitutional: Negative for fatigue.  HENT: Negative for congestion.   Respiratory: Negative for cough and shortness of breath.   Skin: Positive for rash.       Objective:   Physical Exam  Constitutional: She appears well-developed and well-nourished.  Neck: Normal range of motion. Neck supple.  Cardiovascular: Normal rate and regular rhythm.   Pulmonary/Chest: Effort normal and breath sounds normal.  Abdominal: Soft. Bowel sounds are normal.  Skin:       Ulcer on bottom lip, consistent with apthous ulcer.   Erythermatous rash on B forearms and torso... Linear blistering, excoriation pattern.          Assessment & Plan:

## 2011-10-13 ENCOUNTER — Encounter: Payer: Self-pay | Admitting: Family Medicine

## 2011-10-13 ENCOUNTER — Ambulatory Visit (INDEPENDENT_AMBULATORY_CARE_PROVIDER_SITE_OTHER): Payer: MEDICARE | Admitting: Family Medicine

## 2011-10-13 ENCOUNTER — Ambulatory Visit: Payer: MEDICARE | Admitting: Family Medicine

## 2011-10-13 VITALS — BP 144/70 | HR 92 | Temp 98.4°F | Wt 180.0 lb

## 2011-10-13 DIAGNOSIS — L255 Unspecified contact dermatitis due to plants, except food: Secondary | ICD-10-CM

## 2011-10-13 MED ORDER — PREDNISONE 10 MG PO KIT: PACK | ORAL | Status: DC

## 2011-10-13 MED ORDER — SCOPOLAMINE 1 MG/3DAYS TD PT72: 1.0000 | MEDICATED_PATCH | TRANSDERMAL | Status: DC

## 2011-10-13 NOTE — Progress Notes (Signed)
Prev seen for plant dermatitis and given IM steroid.  Still itching in spite of injection and topical OTC tx.  Likely plan exposure after work in the yard.  R >L arm rash, itching and red.  Also with trunk lesions. No fevers, vomiting.  No tongue swelling, no dysphagia, not sob.    Needs antiemetic for cruise coming up.   Meds, vitals, and allergies reviewed.   ROS: See HPI.  Otherwise, noncontributory.  nad No lip edema rrr ctab Diffuse blanching red rash with irregular border and irregular (occ linear) distribution of vesicles on R>L arm and trunk

## 2011-10-13 NOTE — Patient Instructions (Addendum)
Take the steroid pack with food as directed.  This should gradually improve.  Use the patches on the cruise.  They are potentially sedating.

## 2011-10-13 NOTE — Assessment & Plan Note (Signed)
Doesn't appear infected.  Start oral prednisone with steroid cautions given.  F/u prn.  Should resolve.  Also rx sent for scop patch for the cruise.  Routine cautions given.

## 2012-04-05 ENCOUNTER — Encounter: Payer: Self-pay | Admitting: Gynecology

## 2012-04-26 ENCOUNTER — Encounter: Payer: Self-pay | Admitting: Gynecology

## 2012-06-01 ENCOUNTER — Encounter: Payer: MEDICARE | Admitting: Internal Medicine

## 2012-06-01 ENCOUNTER — Ambulatory Visit (INDEPENDENT_AMBULATORY_CARE_PROVIDER_SITE_OTHER): Payer: Medicare Other | Admitting: Family Medicine

## 2012-06-01 ENCOUNTER — Encounter: Payer: Self-pay | Admitting: Family Medicine

## 2012-06-01 VITALS — BP 120/82 | HR 93 | Temp 98.5°F | Ht 65.0 in | Wt 198.0 lb

## 2012-06-01 DIAGNOSIS — E785 Hyperlipidemia, unspecified: Secondary | ICD-10-CM

## 2012-06-01 DIAGNOSIS — C44319 Basal cell carcinoma of skin of other parts of face: Secondary | ICD-10-CM

## 2012-06-01 DIAGNOSIS — E559 Vitamin D deficiency, unspecified: Secondary | ICD-10-CM

## 2012-06-01 DIAGNOSIS — J4489 Other specified chronic obstructive pulmonary disease: Secondary | ICD-10-CM

## 2012-06-01 DIAGNOSIS — Z0000 Encounter for general adult medical examination without abnormal findings: Secondary | ICD-10-CM

## 2012-06-01 DIAGNOSIS — R5381 Other malaise: Secondary | ICD-10-CM

## 2012-06-01 DIAGNOSIS — Z780 Asymptomatic menopausal state: Secondary | ICD-10-CM

## 2012-06-01 DIAGNOSIS — J449 Chronic obstructive pulmonary disease, unspecified: Secondary | ICD-10-CM

## 2012-06-01 DIAGNOSIS — L659 Nonscarring hair loss, unspecified: Secondary | ICD-10-CM

## 2012-06-01 DIAGNOSIS — J029 Acute pharyngitis, unspecified: Secondary | ICD-10-CM

## 2012-06-01 DIAGNOSIS — C44311 Basal cell carcinoma of skin of nose: Secondary | ICD-10-CM | POA: Insufficient documentation

## 2012-06-01 LAB — CBC WITH DIFFERENTIAL/PLATELET
Basophils Absolute: 0 10*3/uL (ref 0.0–0.1)
Basophils Relative: 0 % (ref 0–1)
Eosinophils Relative: 1 % (ref 0–5)
HCT: 37.7 % (ref 36.0–46.0)
Hemoglobin: 12.5 g/dL (ref 12.0–15.0)
MCH: 30.6 pg (ref 26.0–34.0)
MCHC: 33.2 g/dL (ref 30.0–36.0)
MCV: 92.2 fL (ref 78.0–100.0)
Monocytes Absolute: 0.7 10*3/uL (ref 0.1–1.0)
Monocytes Relative: 6 % (ref 3–12)
Neutro Abs: 9 10*3/uL — ABNORMAL HIGH (ref 1.7–7.7)
RDW: 13.2 % (ref 11.5–15.5)

## 2012-06-01 LAB — LIPID PANEL
Cholesterol: 187 mg/dL (ref 0–200)
HDL: 65 mg/dL (ref >39)
Triglycerides: 101 mg/dL (ref <150)

## 2012-06-01 LAB — COMPREHENSIVE METABOLIC PANEL
AST: 18 U/L (ref 0–37)
Alkaline Phosphatase: 64 U/L (ref 39–117)
Glucose, Bld: 88 mg/dL (ref 70–99)
Potassium: 3.8 mEq/L (ref 3.5–5.3)
Sodium: 139 mEq/L (ref 135–145)
Total Bilirubin: 0.5 mg/dL (ref 0.3–1.2)
Total Protein: 6.3 g/dL (ref 6.0–8.3)

## 2012-06-01 NOTE — Progress Notes (Signed)
Subjective:    Patient ID: Hannah Green, female    DOB: December 25, 1945, 67 y.o.   MRN: [id number]  HPI  I have personally reviewed the Medicare Annual Wellness questionnaire and have noted 1. The patient's medical and social history 2. Their use of alcohol, tobacco or illicit drugs 3. Their current medications and supplements 4. The patient's functional ability including ADL's, fall risks, home safety risks and hearing or visual             impairment. 5. Diet and physical activities 6. Evidence for depression or mood disorders The patients weight, height, BMI and visual acuity have been recorded in the chart I have made referrals, counseling and provided education to the patient based review of the above and I have provided the pt with a written personalized care plan for preventive services.  She has noted weight gain of 20 lbs in last month and hair loss.. She requests thyroid evaluation. Cold intolerance, dry skin, Face blotchy in morning, swelling mild in ankles B. Decreased appetitie in last 3-4 weeks. Constipation or diarrhea. Wt Readings from Last 3 Encounters:  06/01/12 198 lb (89.812 kg)  10/13/11 180 lb (81.647 kg)  10/11/11 180 lb 12 oz (81.988 kg)    In last 2 days, sore throat, body ache, B ear pain, runny nose.  NO SOB. Not using anything for her symptoms.   Elevated Cholesterol: Due for re-eval. Lab Results  Component Value Date   CHOL 221* 06/03/2011   HDL 57.70 06/03/2011   LDLDIRECT 136.2 06/03/2011   TRIG 105.0 06/03/2011   CHOLHDL 4 06/03/2011  Using medications without problems: On no med. Diet compliance: Fruits and veggies Exercise: Treadmill 15-30 min a day. Other complaints:  COPD:  Has not had spirometry . Stable breathing per pt. Has quit in last year!  Review of Systems  Constitutional: Negative for fever and fatigue.  HENT: Negative for ear pain.   Eyes: Negative for pain.  Respiratory: Negative for chest tightness and shortness of breath.    Cardiovascular: Negative for chest pain, palpitations and leg swelling.  Gastrointestinal: Negative for abdominal pain.  Genitourinary: Negative for dysuria.       Objective:   Physical Exam  Constitutional: Vital signs are normal. She appears well-developed and well-nourished. She is cooperative.  Non-toxic appearance. She does not appear ill. No distress.  HENT:  Head: Normocephalic.  Right Ear: Hearing, tympanic membrane, external ear and ear canal normal.  Left Ear: Hearing, tympanic membrane, external ear and ear canal normal.  Nose: Nose normal.  Eyes: Conjunctivae, EOM and lids are normal. Pupils are equal, round, and reactive to light. No foreign bodies found.  Neck: Trachea normal and normal range of motion. Neck supple. Carotid bruit is not present. No mass and no thyromegaly present.  Cardiovascular: Normal rate, regular rhythm, S1 normal, S2 normal, normal heart sounds and intact distal pulses.  Exam reveals no gallop.   No murmur heard. Pulmonary/Chest: Effort normal and breath sounds normal. No respiratory distress. She has no wheezes. She has no rhonchi. She has no rales.  Abdominal: Soft. Normal appearance and bowel sounds are normal. She exhibits no distension, no fluid wave, no abdominal bruit and no mass. There is no hepatosplenomegaly. There is no tenderness. There is no rebound, no guarding and no CVA tenderness. No hernia.  Genitourinary: There is breast tenderness. No breast swelling, discharge or bleeding. Pelvic exam was performed with patient prone.  Mild ttp on right breast.  Lymphadenopathy:  She has no cervical adenopathy.    She has no axillary adenopathy.  Neurological: She is alert. She has normal strength. No cranial nerve deficit or sensory deficit.  Skin: Skin is warm, dry and intact. No rash noted.  Psychiatric: Her speech is normal and behavior is normal. Judgment normal. Her mood appears not anxious. Cognition and memory are normal. She does not  exhibit a depressed mood.          Assessment & Plan:  The patient's preventative maintenance and recommended screening tests for an annual wellness exam were reviewed in full today. Brought up to date unless services declined.  Counselled on the importance of diet, exercise, and its role in overall health and mortality. The patient's FH and SH was reviewed, including their home life, tobacco status, and drug and alcohol status.   Vaccines: uptodate with Td and PNA.. Due for shingles.. Refused not covered by insurance. Mammo: 03/2012 3D.Marland Kitchen abnormal right but on diagnostic compression nl, repeat in 1 year. Hx of breast CA. Last pap age 48, no further indicated, DVE , low risk, no family history of ovarian or uterine cancer.. She does have some low abdominal symptoms, no vag bleeding. She requests to not do DVE this year. DEXA: Last many years ago was normal. Due for re-eval. Colon: Last 02/2010 nml, Dr. Loreta Ave. Repeat in 10 years.  Quit smoking in last year!

## 2012-06-01 NOTE — Assessment & Plan Note (Signed)
Due for re-eval. 

## 2012-06-01 NOTE — Assessment & Plan Note (Signed)
Improved with smoking cessation. 

## 2012-06-01 NOTE — Assessment & Plan Note (Signed)
Eval TSH. 

## 2012-06-01 NOTE — Assessment & Plan Note (Signed)
And other symptoms... eval with labs.

## 2012-06-01 NOTE — Addendum Note (Signed)
Addended byKerby Nora E on: 06/01/2012 03:02 PM   Modules accepted: Orders

## 2012-06-01 NOTE — Patient Instructions (Addendum)
Stop at front desk on your way out to schedule bone density. Stop at lab on way out. We will call you with the lab results. Tylenol for sore throat.. Try claritin for possible allergy component of symptoms.

## 2012-06-01 NOTE — Assessment & Plan Note (Signed)
Likely viral vs allergies. Treat symptomatically and trial of antihistamine.

## 2012-06-01 NOTE — Addendum Note (Signed)
Addended by: Consuello Masse on: 06/01/2012 03:01 PM   Modules accepted: Orders

## 2012-06-02 LAB — VITAMIN D 25 HYDROXY (VIT D DEFICIENCY, FRACTURES): Vit D, 25-Hydroxy: 27 ng/mL — ABNORMAL LOW (ref 30–89)

## 2012-06-07 ENCOUNTER — Other Ambulatory Visit: Payer: Self-pay | Admitting: *Deleted

## 2012-06-07 ENCOUNTER — Telehealth: Payer: Self-pay | Admitting: Family Medicine

## 2012-06-07 MED ORDER — VITAMIN D (ERGOCALCIFEROL) 1.25 MG (50000 UNIT) PO CAPS: 50000.0000 [IU] | ORAL_CAPSULE | ORAL | Status: DC

## 2012-06-07 MED ORDER — AZITHROMYCIN 250 MG PO TABS: ORAL_TABLET | ORAL | Status: DC

## 2012-06-07 NOTE — Telephone Encounter (Signed)
Message copied by Excell Seltzer on Thu Jun 07, 2012  4:42 PM ------      Message from: Hannah Green      Created: Thu Jun 07, 2012  3:17 PM       Patient advised and she is still having drainage and coughing  And ear pain. ------

## 2012-06-07 NOTE — Telephone Encounter (Signed)
Will treat as bacterial bronchitis. Let pt know i called in antibiotics to complete

## 2012-07-18 ENCOUNTER — Encounter: Payer: Self-pay | Admitting: Gynecology

## 2012-08-28 ENCOUNTER — Other Ambulatory Visit: Payer: Self-pay | Admitting: Family Medicine

## 2012-09-04 DIAGNOSIS — D239 Other benign neoplasm of skin, unspecified: Secondary | ICD-10-CM

## 2012-09-04 HISTORY — DX: Other benign neoplasm of skin, unspecified: D23.9

## 2012-10-18 ENCOUNTER — Ambulatory Visit: Payer: Self-pay | Admitting: Otolaryngology

## 2012-12-07 ENCOUNTER — Other Ambulatory Visit: Payer: Self-pay | Admitting: Family Medicine

## 2012-12-07 NOTE — Telephone Encounter (Signed)
Last visit 06/01/2012.  Ok to refill?

## 2013-04-22 ENCOUNTER — Other Ambulatory Visit: Payer: Self-pay | Admitting: Family Medicine

## 2013-04-22 NOTE — Telephone Encounter (Signed)
Last office visit 06/01/2012.  Last Vit D low at 27 on 06/01/2012.  Ok to refill?

## 2013-05-30 ENCOUNTER — Ambulatory Visit (INDEPENDENT_AMBULATORY_CARE_PROVIDER_SITE_OTHER): Payer: Medicare HMO | Admitting: Family Medicine

## 2013-05-30 ENCOUNTER — Encounter: Payer: Self-pay | Admitting: Family Medicine

## 2013-05-30 VITALS — BP 122/70 | HR 88 | Temp 98.3°F | Ht 65.0 in | Wt 216.8 lb

## 2013-05-30 DIAGNOSIS — H6692 Otitis media, unspecified, left ear: Secondary | ICD-10-CM

## 2013-05-30 DIAGNOSIS — H6690 Otitis media, unspecified, unspecified ear: Secondary | ICD-10-CM

## 2013-05-30 DIAGNOSIS — J441 Chronic obstructive pulmonary disease with (acute) exacerbation: Secondary | ICD-10-CM

## 2013-05-30 MED ORDER — CEFDINIR 300 MG PO CAPS: 600.0000 mg | ORAL_CAPSULE | Freq: Every day | ORAL | Status: DC

## 2013-05-30 MED ORDER — ALBUTEROL SULFATE HFA 108 (90 BASE) MCG/ACT IN AERS: 2.0000 | INHALATION_SPRAY | Freq: Four times a day (QID) | RESPIRATORY_TRACT | Status: DC | PRN

## 2013-05-30 MED ORDER — FLUTICASONE-SALMETEROL 250-50 MCG/DOSE IN AEPB: 1.0000 | INHALATION_SPRAY | Freq: Two times a day (BID) | RESPIRATORY_TRACT | Status: DC

## 2013-05-30 NOTE — Progress Notes (Signed)
Date:  05/30/2013   Name:  Hannah Green   DOB:  October 04, 1945   MRN:  [id number] Gender: female Age: 68 y.o.  Primary Physician:  Eliezer Lofts, MD   Chief Complaint: Otalgia, Neck Pain, Shortness of Breath and Sinus Pressure   Subjective:   History of Present Illness:  Hannah Green is a 68 y.o. very pleasant female patient who presents with the following:  Pleasant patient with rheumatoid arthritis, status post adenocarcinoma of the breast and COPD who presents with left ear pain it has been worsening over the last few days. Yesterday she started to get some drainage from the left ear. She is having quite a bit of pain on the posterior aspect of the year as well as just inferior to it.  She has been having some nasal congestion, as well as some sore throat. She also does have COPD and in the last 3 or 4 days she has been having more shortness of breath than normal, and she needs refills on both her Advair and her Proair.  Cannot swallow, and L > R ear is hurting a lot. Clear stuff ran out of the left one and it throbs.   Past Medical History, Surgical History, Social History, Family History, Problem List, Medications, and Allergies have been reviewed and updated if relevant.  Review of Systems: ROS: GEN: Acute illness details above GI: Tolerating PO intake GU: maintaining adequate hydration and urination Pulm: above Interactive and getting along well at home.  Otherwise, ROS is as per the HPI.   Objective:   Physical Examination: BP 122/70  Pulse 88  Temp(Src) 98.3 F (36.8 C) (Oral)  Ht 5\' 5"  (1.651 m)  Wt 216 lb 12 oz (98.317 kg)  BMI 36.07 kg/m2  SpO2 98%   GEN: A and O x 3. WDWN. NAD.    ENT: Nose clear, ext NML.  No LAD.  No JVD.  Right tympanic membrane is normal with some serous fluid. Left tympanic membrane is bulging and obscured, so that no landmarks are visualized. There is also some wetness in the canal, but no defect is visualized. Oropharynx clear.    PULM: Normal WOB, no distress. No crackles, wheezes, rhonchi. CV: RRR, no M/G/R, No rubs, No JVD.   EXT: warm and well-perfused, No c/c/e. PSYCH: Pleasant and conversant.    Laboratory and Imaging Data:  Assessment & Plan:   Left otitis media  COPD exacerbation  TM rupture, treat with Omnicef. Refill Advair and albuterol.  If not improving and if hearing not fully cleared up after antibiotics, then additional evaluation is warranted.  Patient's Medications  New Prescriptions   CEFDINIR (OMNICEF) 300 MG CAPSULE    Take 2 capsules (600 mg total) by mouth daily.  Previous Medications   BIOTIN 1 MG CAPS    Take by mouth.   VITAMIN B-12 (CYANOCOBALAMIN) 1000 MCG TABLET    Take 1,000 mcg by mouth daily.   VITAMIN D, ERGOCALCIFEROL, (DRISDOL) 50000 UNITS CAPS CAPSULE    TAKE 1 CAPSULE (50,000 UNITS TOTAL) BY MOUTH EVERY 7 (SEVEN) DAYS.  Modified Medications   Modified Medication Previous Medication   ALBUTEROL (PROVENTIL HFA) 108 (90 BASE) MCG/ACT INHALER albuterol (PROVENTIL HFA) 108 (90 BASE) MCG/ACT inhaler      Inhale 2 puffs into the lungs every 6 (six) hours as needed for wheezing or shortness of breath.    Inhale 2 puffs into the lungs every 6 (six) hours as needed for wheezing or shortness of breath.  FLUTICASONE-SALMETEROL (ADVAIR) 250-50 MCG/DOSE AEPB Fluticasone-Salmeterol (ADVAIR) 250-50 MCG/DOSE AEPB      Inhale 1 puff into the lungs every 12 (twelve) hours.    Inhale 1 puff into the lungs every 12 (twelve) hours.  Discontinued Medications   AZITHROMYCIN (ZITHROMAX) 250 MG TABLET    2 tab po x 1 day then 1 tab po daily   CHOLECALCIFEROL (VITAMIN D) 1000 UNITS TABLET    Take 1,000 Units by mouth daily.   FISH OIL-OMEGA-3 FATTY ACIDS 1000 MG CAPSULE    Take 2 g by mouth daily.   There are no Patient Instructions on file for this visit.  Signed,  Maud Deed. Abbe Bula, MD, [place] [facility] [place]  [address] Phone: [phone] Fax: [phone]

## 2013-05-30 NOTE — Progress Notes (Signed)
Pre visit review using our clinic review tool, if applicable. No additional management support is needed unless otherwise documented below in the visit note. 

## 2013-06-19 ENCOUNTER — Ambulatory Visit (INDEPENDENT_AMBULATORY_CARE_PROVIDER_SITE_OTHER): Payer: Medicare HMO

## 2013-06-19 ENCOUNTER — Encounter: Payer: Self-pay | Admitting: Podiatry

## 2013-06-19 ENCOUNTER — Ambulatory Visit (INDEPENDENT_AMBULATORY_CARE_PROVIDER_SITE_OTHER): Payer: Medicare HMO | Admitting: Podiatry

## 2013-06-19 VITALS — Resp 16 | Ht 67.0 in | Wt 205.0 lb

## 2013-06-19 DIAGNOSIS — M79609 Pain in unspecified limb: Secondary | ICD-10-CM

## 2013-06-19 DIAGNOSIS — M79673 Pain in unspecified foot: Secondary | ICD-10-CM

## 2013-06-19 DIAGNOSIS — S93409A Sprain of unspecified ligament of unspecified ankle, initial encounter: Secondary | ICD-10-CM

## 2013-06-19 NOTE — Progress Notes (Signed)
   Subjective:    Patient ID: Hannah Green, female    DOB: [birth year], 68 y.o.   MRN: [id number]  HPI Comments: i fell yesterday afternoon and hurt my left foot and ankle. It hurts to walk. i have elevated it, used an icy pk and used heat last night.   Foot Pain      Review of Systems  Constitutional: Negative.   HENT: Negative.   Eyes: Negative.   Respiratory: Positive for shortness of breath.   Allergic/Immunologic: Negative.   All other systems reviewed and are negative.      Objective:   Physical Exam: I have reviewed her past medical history medications allergies surgeries and social history. Pulses are palpable. Neurologic sensorium is intact. Deep tendon reflexes are intact. Orthopedic evaluation demonstrates pain on palpation to the lateral ankle around the ATFL. She also has pain on the dorsal aspect of the left foot with an excoriation. Radiographic evaluation does not demonstrate any type of osseous abnormalities at this point that she does have erythema and ecchymosis over the fifth metatarsal and cuboid area.        Assessment & Plan:  Assessment: Sprain ankle left. Neuralgias neurapraxia secondary to trauma dorsal aspect left foot with excoriation.  Plan: At this point we discussed rest ice compression and elevation. I also put her in a cam boot.

## 2013-07-03 ENCOUNTER — Ambulatory Visit (INDEPENDENT_AMBULATORY_CARE_PROVIDER_SITE_OTHER): Payer: Medicare HMO | Admitting: Podiatry

## 2013-07-03 ENCOUNTER — Ambulatory Visit (INDEPENDENT_AMBULATORY_CARE_PROVIDER_SITE_OTHER): Payer: Medicare HMO

## 2013-07-03 VITALS — BP 109/59 | HR 79 | Resp 16

## 2013-07-03 DIAGNOSIS — M79673 Pain in unspecified foot: Secondary | ICD-10-CM

## 2013-07-03 DIAGNOSIS — S93409A Sprain of unspecified ligament of unspecified ankle, initial encounter: Secondary | ICD-10-CM

## 2013-07-03 DIAGNOSIS — M79609 Pain in unspecified limb: Secondary | ICD-10-CM

## 2013-07-03 MED ORDER — MELOXICAM 15 MG PO TABS: 15.0000 mg | ORAL_TABLET | Freq: Every day | ORAL | Status: DC

## 2013-07-03 NOTE — Progress Notes (Signed)
She presents today one month status post ankle sprain left. She states it is getting better but is not well. He may even be more painful over the years she points to the dorsal lateral aspect of the left foot.  Objective: She has swelling about the left foot. The pain on palpation of the sinus tarsi the dorsal lateral aspect of the foot and the lesser metatarsals. Pulses remain palpable left foot and no calf pain.  Assessment: Ankle sprain with edema left foot.  Plan: Put her in a compression anklet and a tri-lock brace and I will followup with her in one month to 6 weeks.

## 2013-08-14 ENCOUNTER — Ambulatory Visit (INDEPENDENT_AMBULATORY_CARE_PROVIDER_SITE_OTHER): Payer: Medicare HMO

## 2013-08-14 ENCOUNTER — Ambulatory Visit (INDEPENDENT_AMBULATORY_CARE_PROVIDER_SITE_OTHER): Payer: Medicare HMO | Admitting: Podiatry

## 2013-08-14 VITALS — BP 130/67 | HR 79 | Resp 16

## 2013-08-14 DIAGNOSIS — S93409A Sprain of unspecified ligament of unspecified ankle, initial encounter: Secondary | ICD-10-CM

## 2013-08-14 NOTE — Progress Notes (Signed)
She presents today for followup of her sprain ankle left she states that the dorsal aspect of her left foot is becoming more sensitive and more swollen. She states I do so healing right.  Objective: Pulses are stable she is alert and oriented x3. She has mild to moderate edema with pain on palpation anterior talofibular ligament as well as mild edema overlying the dorsal lateral aspect of the left foot with pain on palpation to the lesser metatarsals. I see no ecchymosis. Radiographic evaluation does not demonstrate any changes and no fractures are noted.  Assessment: Sprain ankle left.  Plan: I suggested that she try to use her tri-lock brace with her compression anklet keep the foot elevated as much as she can and continue to wear tennis shoes and not flip flops and sandals. I expressed to her that more than likely this will resolve in the next few months however it does not an MRI will be indicated.

## 2013-11-11 ENCOUNTER — Ambulatory Visit: Payer: Medicare HMO | Admitting: Podiatry

## 2013-12-06 ENCOUNTER — Encounter: Payer: Self-pay | Admitting: Family Medicine

## 2013-12-06 ENCOUNTER — Ambulatory Visit (INDEPENDENT_AMBULATORY_CARE_PROVIDER_SITE_OTHER): Payer: Medicare HMO | Admitting: Family Medicine

## 2013-12-06 VITALS — BP 120/60 | HR 69 | Temp 98.4°F | Ht 65.0 in | Wt 219.8 lb

## 2013-12-06 DIAGNOSIS — N6310 Unspecified lump in the right breast, unspecified quadrant: Secondary | ICD-10-CM | POA: Insufficient documentation

## 2013-12-06 DIAGNOSIS — N63 Unspecified lump in breast: Secondary | ICD-10-CM

## 2013-12-06 DIAGNOSIS — D2339 Other benign neoplasm of skin of other parts of face: Secondary | ICD-10-CM

## 2013-12-06 DIAGNOSIS — D1720 Benign lipomatous neoplasm of skin and subcutaneous tissue of unspecified limb: Secondary | ICD-10-CM

## 2013-12-06 NOTE — Progress Notes (Signed)
Pre visit review using our clinic review tool, if applicable. No additional management support is needed unless otherwise documented below in the visit note. 

## 2013-12-06 NOTE — Addendum Note (Signed)
Addended by: Eliezer Lofts E on: 12/06/2013 03:14 PM   Modules accepted: Orders

## 2013-12-06 NOTE — Assessment & Plan Note (Signed)
No clear connection to knee joint.

## 2013-12-06 NOTE — Assessment & Plan Note (Signed)
INcreasing in size, pt with history of breast cancer on right . Send for repeat diagnostic mammo

## 2013-12-06 NOTE — Patient Instructions (Signed)
Stop at front desk for referral.

## 2013-12-06 NOTE — Assessment & Plan Note (Signed)
Vs bone spur. Nontender, no infeciton

## 2013-12-06 NOTE — Progress Notes (Signed)
   Subjective:    Patient ID: Hannah Green, female    DOB: 14-Oct-1945, 68 y.o.   MRN: [id number]  HPI  68 year old female with history of breast cancer ( Dx 1999, right breast treated with lumpectomy with lymph node dissectionand radiation) present with several issues.  1.Breast mass: Last mammogram  On file 03/2012, pt states she had routine yearly 05/2013 saw lump on right at that time on mammogram , planned to follow unless changing.  She has noted  Lump in right breast 6 months to 1 year ago.  She feels that mass is now bigger in the last month. No skin rash, no nipple discharge. Very tender to palpation.  2.knot on forehead: noted 6 months ago. Mildly tender  3. Soft swelling on knee: Has noted swelling increasing in right knee, soft, non tender, She does have come chronic knee pain.     Review of Systems     Objective:   Physical Exam  Constitutional: Vital signs are normal. She appears well-developed and well-nourished. She is cooperative.  Non-toxic appearance. She does not appear ill. No distress.  HENT:  Head: Normocephalic.  Right Ear: Hearing, tympanic membrane, external ear and ear canal normal. Tympanic membrane is not erythematous, not retracted and not bulging.  Left Ear: Hearing, tympanic membrane, external ear and ear canal normal. Tympanic membrane is not erythematous, not retracted and not bulging.  Nose: No mucosal edema or rhinorrhea. Right sinus exhibits no maxillary sinus tenderness and no frontal sinus tenderness. Left sinus exhibits no maxillary sinus tenderness and no frontal sinus tenderness.  Mouth/Throat: Uvula is midline, oropharynx is clear and moist and mucous membranes are normal.  Eyes: Conjunctivae, EOM and lids are normal. Pupils are equal, round, and reactive to light. Lids are everted and swept, no foreign bodies found.  Neck: Trachea normal and normal range of motion. Neck supple. Carotid bruit is not present. No mass and no thyromegaly present.   Cardiovascular: Normal rate, regular rhythm, S1 normal, S2 normal, normal heart sounds, intact distal pulses and normal pulses.  Exam reveals no gallop and no friction rub.   No murmur heard. Pulmonary/Chest: Effort normal and breath sounds normal. Not tachypneic. No respiratory distress. She has no decreased breath sounds. She has no wheezes. She has no rhonchi. She has no rales.  Abdominal: Soft. Normal appearance and bowel sounds are normal. There is no tenderness.  Genitourinary: There is breast tenderness. No breast swelling, discharge or bleeding.  4 cm mass at 1 oclock in right breast.  no axillary lymphadenopathy, but tender in right axillae  Musculoskeletal:       Right knee: She exhibits normal range of motion and no effusion. No tenderness found. No medial joint line and no lateral joint line tenderness noted.  Neurological: She is alert.  Skin: Skin is warm, dry and intact. No rash noted.  Soft tissue swelling on left lateral knee   firm nodule, ? [place] on scalp  Psychiatric: Her speech is normal and behavior is normal. Judgment and thought content normal. Her mood appears not anxious. Cognition and memory are normal. She does not exhibit a depressed mood.          Assessment & Plan:

## 2013-12-10 ENCOUNTER — Encounter: Payer: Self-pay | Admitting: Family Medicine

## 2014-02-07 ENCOUNTER — Encounter: Payer: Self-pay | Admitting: Family Medicine

## 2014-02-07 ENCOUNTER — Ambulatory Visit (INDEPENDENT_AMBULATORY_CARE_PROVIDER_SITE_OTHER): Payer: Medicare HMO | Admitting: Family Medicine

## 2014-02-07 VITALS — BP 102/68 | HR 100 | Temp 98.2°F | Ht 65.0 in | Wt 213.0 lb

## 2014-02-07 DIAGNOSIS — J441 Chronic obstructive pulmonary disease with (acute) exacerbation: Secondary | ICD-10-CM

## 2014-02-07 MED ORDER — PREDNISONE 20 MG PO TABS: ORAL_TABLET | ORAL | Status: DC

## 2014-02-07 MED ORDER — AZITHROMYCIN 250 MG PO TABS: ORAL_TABLET | ORAL | Status: DC

## 2014-02-07 NOTE — Patient Instructions (Signed)
Complete antibiotics and prednisone taper.  Use albuterol as needed for coughing fits and wheezing/SOB. Restart advair twice daily. Go to ER  Severe shortness of breath, call her if not improving as expected.

## 2014-02-07 NOTE — Progress Notes (Signed)
Pre visit review using our clinic review tool, if applicable. No additional management support is needed unless otherwise documented below in the visit note. 

## 2014-02-07 NOTE — Progress Notes (Signed)
   Subjective:    Patient ID: Hannah Green, female    DOB: 05-Feb-1946, 68 y.o.   MRN: [id number]  Cough This is a new problem. The current episode started 1 to 4 weeks ago ( 2 weeks). The problem has been gradually worsening. The problem occurs constantly. The cough is productive of purulent sputum. Associated symptoms include ear pain, rhinorrhea, a sore throat and shortness of breath. Pertinent negatives include no headaches. Associated symptoms comments: Chest congestion. The symptoms are aggravated by lying down (coughing through the night). Treatments tried: She uses albuterol as needed, needing every 1-2 hours, she has not been taking advair she was on in the past since she was doing better since quitting smoking a few years ago. The treatment provided mild (temporary) relief. Her past medical history is significant for COPD. There is no history of asthma, environmental allergies or pneumonia.  Shortness of Breath Associated symptoms include ear pain, rhinorrhea and a sore throat. Pertinent negatives include no headaches. Her past medical history is significant for COPD. There is no history of asthma or pneumonia.  Otalgia  There is pain in both ears. The current episode started in the past 7 days (4-5 days). There has been no fever. Associated symptoms include coughing, rhinorrhea and a sore throat. Pertinent negatives include no ear discharge, headaches or hearing loss. Treatments tried: emergen-C.      Review of Systems  HENT: Positive for ear pain, rhinorrhea and sore throat. Negative for ear discharge and hearing loss.   Respiratory: Positive for cough and shortness of breath.   Allergic/Immunologic: Negative for environmental allergies.  Neurological: Negative for headaches.       Objective:   Physical Exam  Constitutional: Vital signs are normal. She appears well-developed and well-nourished. She is cooperative.  Non-toxic appearance. She does not appear ill. No distress.    HENT:  Head: Normocephalic.  Right Ear: Hearing, tympanic membrane, external ear and ear canal normal. Tympanic membrane is not erythematous, not retracted and not bulging.  Left Ear: Hearing, tympanic membrane, external ear and ear canal normal. Tympanic membrane is not erythematous, not retracted and not bulging.  Nose: Mucosal edema and rhinorrhea present. Right sinus exhibits no maxillary sinus tenderness and no frontal sinus tenderness. Left sinus exhibits no maxillary sinus tenderness and no frontal sinus tenderness.  Mouth/Throat: Uvula is midline, oropharynx is clear and moist and mucous membranes are normal.  Eyes: Conjunctivae, EOM and lids are normal. Pupils are equal, round, and reactive to light. Lids are everted and swept, no foreign bodies found.  Neck: Trachea normal and normal range of motion. Neck supple. Carotid bruit is not present. No thyroid mass and no thyromegaly present.  Cardiovascular: Normal rate, regular rhythm, S1 normal, S2 normal, normal heart sounds, intact distal pulses and normal pulses.  Exam reveals no gallop and no friction rub.   No murmur heard. Pulmonary/Chest: No tachypnea. No respiratory distress. She has no decreased breath sounds. She has wheezes. She has no rhonchi. She has no rales.  Diffuse wheeze, constant cough  Neurological: She is alert.  Skin: Skin is warm, dry and intact. No rash noted.  Psychiatric: Her speech is normal and behavior is normal. Judgment normal. Her mood appears not anxious. Cognition and memory are normal. She does not exhibit a depressed mood.          Assessment & Plan:

## 2014-04-16 ENCOUNTER — Other Ambulatory Visit: Payer: Self-pay | Admitting: Family Medicine

## 2014-11-11 ENCOUNTER — Ambulatory Visit (INDEPENDENT_AMBULATORY_CARE_PROVIDER_SITE_OTHER): Payer: Medicare HMO | Admitting: Family Medicine

## 2014-11-11 ENCOUNTER — Encounter: Payer: Self-pay | Admitting: Family Medicine

## 2014-11-11 VITALS — BP 120/70 | HR 96 | Temp 98.4°F | Ht 65.0 in | Wt 208.8 lb

## 2014-11-11 DIAGNOSIS — K5900 Constipation, unspecified: Secondary | ICD-10-CM

## 2014-11-11 DIAGNOSIS — R0789 Other chest pain: Secondary | ICD-10-CM | POA: Insufficient documentation

## 2014-11-11 DIAGNOSIS — K219 Gastro-esophageal reflux disease without esophagitis: Secondary | ICD-10-CM

## 2014-11-11 DIAGNOSIS — R5383 Other fatigue: Secondary | ICD-10-CM | POA: Diagnosis not present

## 2014-11-11 DIAGNOSIS — E785 Hyperlipidemia, unspecified: Secondary | ICD-10-CM

## 2014-11-11 DIAGNOSIS — K5909 Other constipation: Secondary | ICD-10-CM | POA: Insufficient documentation

## 2014-11-11 DIAGNOSIS — M5442 Lumbago with sciatica, left side: Secondary | ICD-10-CM | POA: Diagnosis not present

## 2014-11-11 LAB — LIPID PANEL
CHOL/HDL RATIO: 3
Cholesterol: 187 mg/dL (ref 0–200)
HDL: 70.9 mg/dL (ref >39.00)
LDL Cholesterol: 99 mg/dL (ref 0–99)
NonHDL: 116.49
TRIGLYCERIDES: 87 mg/dL (ref 0.0–149.0)
VLDL: 17.4 mg/dL (ref 0.0–40.0)

## 2014-11-11 LAB — CBC WITH DIFFERENTIAL/PLATELET
BASOS ABS: 0 10*3/uL (ref 0.0–0.1)
Basophils Relative: 0.6 % (ref 0.0–3.0)
Eosinophils Absolute: 0.2 10*3/uL (ref 0.0–0.7)
Eosinophils Relative: 2 % (ref 0.0–5.0)
HEMATOCRIT: 42 % (ref 36.0–46.0)
Hemoglobin: 14.2 g/dL (ref 12.0–15.0)
Lymphocytes Relative: 32 % (ref 12.0–46.0)
Lymphs Abs: 2.8 10*3/uL (ref 0.7–4.0)
MCHC: 33.8 g/dL (ref 30.0–36.0)
MCV: 91.9 fl (ref 78.0–100.0)
MONO ABS: 0.8 10*3/uL (ref 0.1–1.0)
Monocytes Relative: 9.5 % (ref 3.0–12.0)
NEUTROS PCT: 55.9 % (ref 43.0–77.0)
Neutro Abs: 4.9 10*3/uL (ref 1.4–7.7)
PLATELETS: 350 10*3/uL (ref 150.0–400.0)
RBC: 4.57 Mil/uL (ref 3.87–5.11)
RDW: 13.2 % (ref 11.5–15.5)
WBC: 8.8 10*3/uL (ref 4.0–10.5)

## 2014-11-11 LAB — COMPREHENSIVE METABOLIC PANEL
ALK PHOS: 81 U/L (ref 39–117)
ALT: 18 U/L (ref 0–35)
AST: 15 U/L (ref 0–37)
Albumin: 3.7 g/dL (ref 3.5–5.2)
BILIRUBIN TOTAL: 0.6 mg/dL (ref 0.2–1.2)
BUN: 16 mg/dL (ref 6–23)
CALCIUM: 9 mg/dL (ref 8.4–10.5)
CO2: 30 meq/L (ref 19–32)
Chloride: 102 mEq/L (ref 96–112)
Creatinine, Ser: 0.61 mg/dL (ref 0.40–1.20)
GFR: 103.28 mL/min (ref >60.00)
Glucose, Bld: 110 mg/dL — ABNORMAL HIGH (ref 70–99)
POTASSIUM: 4.4 meq/L (ref 3.5–5.1)
Sodium: 138 mEq/L (ref 135–145)
Total Protein: 7.1 g/dL (ref 6.0–8.3)

## 2014-11-11 LAB — T4, FREE: Free T4: 0.84 ng/dL (ref 0.60–1.60)

## 2014-11-11 LAB — T3, FREE: T3 FREE: 3.1 pg/mL (ref 2.3–4.2)

## 2014-11-11 LAB — TSH: TSH: 3.94 u[IU]/mL (ref 0.35–4.50)

## 2014-11-11 MED ORDER — MELOXICAM 7.5 MG PO TABS: 7.5000 mg | ORAL_TABLET | Freq: Every day | ORAL | Status: DC

## 2014-11-11 NOTE — Addendum Note (Signed)
Addended by: Carter Kitten on: 11/11/2014 08:14 AM   Modules accepted: Orders

## 2014-11-11 NOTE — Assessment & Plan Note (Signed)
EKG:  Likely due to GERD.

## 2014-11-11 NOTE — Assessment & Plan Note (Signed)
Eval with labs.

## 2014-11-11 NOTE — Assessment & Plan Note (Signed)
Increase fiber, water continue exercise as able.

## 2014-11-11 NOTE — Patient Instructions (Addendum)
Drink water, continue exercise increase fiber in diet. Start miralax daily for constipation. Start prilosec 2 tabs x 20 mg daily for reflux. Start meloxicam as needed for low back pain. Start heat, low back massage and low back strethes No lifting > 10 lbs. Stop at lab on way out. Follow up in 2 weeks 30 min OV.

## 2014-11-11 NOTE — Assessment & Plan Note (Signed)
Due for re-eval. 

## 2014-11-11 NOTE — Assessment & Plan Note (Signed)
Treat with meloxciam prn, low dose.. To avoid more irritation of stomach.   Massage, heat, stretching info given.

## 2014-11-11 NOTE — Progress Notes (Signed)
Subjective:    Patient ID: Hannah Green, female    DOB: [birth year], 69 y.o.   MRN: [id number]  HPI  69 year old female with history of breast cancer , RA presents with new onset  Multiple symptoms in last few weeks. Gradually worsening.  She has pain in  left lower back, buttock, radiates mainly down left leg. Occ pain shoots across to right and down right leg.NO SOB.  tried ibuprofen for pain.. helped some.. Usually does not take anything. HAs grandson 30 lbs that she has been lifintg. She has been very fatigued, occ dizzy. Hair falling out.  She has chronic constipation, ongoing for long time.  Worse in last few weeks. Firm swelling in lower abdomen. Sometimes when she sits to go to the bathroom she feels that her insides are falling out. She feels vaginal and rectal pressure. Straining to get BM out. Small pellets, has BM every other day. Has not been using anything for the constipation.  Occ blood in stool from straining.  She has been having sharp pains in left chest in last few months.  Occuring at rest and with exertion.  Last a few seconds.   She has constant heartburn. Using OTC antacid.Marland Kitchen Helps some. Occ nauseated. No emesis.   She exercises 3 times a week, drinks water, eats fiber a lot.  No fever.  2012 Dr. Collene Mares, polyps, tics and hemorrhoids.    Review of Systems  Constitutional: Positive for fatigue. Negative for fever.  HENT: Negative for ear pain.   Eyes: Negative for pain.  Respiratory: Negative for shortness of breath.   Cardiovascular: Negative for palpitations and leg swelling.  Gastrointestinal: Negative for diarrhea.  Genitourinary: Negative for dysuria.       Objective:   Physical Exam  Constitutional: Vital signs are normal. She appears well-developed and well-nourished. She is cooperative.  Non-toxic appearance. She does not appear ill. No distress.  HENT:  Head: Normocephalic.  Right Ear: Hearing, tympanic membrane, external ear and ear  canal normal. Tympanic membrane is not erythematous, not retracted and not bulging.  Left Ear: Hearing, tympanic membrane, external ear and ear canal normal. Tympanic membrane is not erythematous, not retracted and not bulging.  Nose: No mucosal edema or rhinorrhea. Right sinus exhibits no maxillary sinus tenderness and no frontal sinus tenderness. Left sinus exhibits no maxillary sinus tenderness and no frontal sinus tenderness.  Mouth/Throat: Uvula is midline, oropharynx is clear and moist and mucous membranes are normal.  Eyes: Conjunctivae, EOM and lids are normal. Pupils are equal, round, and reactive to light. Lids are everted and swept, no foreign bodies found.  Neck: Trachea normal and normal range of motion. Neck supple. Carotid bruit is not present. No thyroid mass and no thyromegaly present.  Cardiovascular: Normal rate, regular rhythm, S1 normal, S2 normal, normal heart sounds, intact distal pulses and normal pulses.  Exam reveals no gallop and no friction rub.   No murmur heard. Pulmonary/Chest: Effort normal and breath sounds normal. No tachypnea. No respiratory distress. She has no decreased breath sounds. She has no wheezes. She has no rhonchi. She has no rales.  Abdominal: Soft. Normal appearance and bowel sounds are normal. There is no hepatosplenomegaly. There is generalized tenderness. There is no rigidity, no rebound, no guarding and no CVA tenderness.  Musculoskeletal:       Lumbar back: She exhibits tenderness. She exhibits no bony tenderness.  Left lowe back pain and left buttock pain, neg Faber's, positive SLR.   Neurological:  She is alert. She has normal strength. No sensory deficit. She displays a negative Romberg sign. Coordination and gait normal.  Skin: Skin is warm, dry and intact. No rash noted.  Psychiatric: Her speech is normal and behavior is normal. Judgment and thought content normal. Her mood appears not anxious. Cognition and memory are normal. She does not  exhibit a depressed mood.          Assessment & Plan:

## 2014-11-11 NOTE — Progress Notes (Signed)
Pre visit review using our clinic review tool, if applicable. No additional management support is needed unless otherwise documented below in the visit note. 

## 2014-11-21 ENCOUNTER — Ambulatory Visit: Payer: Medicare HMO | Admitting: Family Medicine

## 2014-12-12 ENCOUNTER — Telehealth: Payer: Self-pay | Admitting: Family Medicine

## 2014-12-12 NOTE — Telephone Encounter (Signed)
Received letter from [facility] stating patient was to have a 6 month follow up appointment but never scheduled.  Hannah Green advised of letter and she states she will call [facility] to schedule her follow up appointment.  She also states she never heard back from her lab results.  Lab results given.  Copy of Labs and Stopping prediabetes in its tracks handout mailed to Hannah Green.

## 2014-12-12 NOTE — Telephone Encounter (Signed)
Patient returned Donna's call.

## 2014-12-18 DIAGNOSIS — H10413 Chronic giant papillary conjunctivitis, bilateral: Secondary | ICD-10-CM | POA: Diagnosis not present

## 2014-12-18 DIAGNOSIS — H04123 Dry eye syndrome of bilateral lacrimal glands: Secondary | ICD-10-CM | POA: Diagnosis not present

## 2014-12-18 DIAGNOSIS — H16223 Keratoconjunctivitis sicca, not specified as Sjogren's, bilateral: Secondary | ICD-10-CM | POA: Diagnosis not present

## 2014-12-19 DIAGNOSIS — Z23 Encounter for immunization: Secondary | ICD-10-CM | POA: Diagnosis not present

## 2015-01-28 DIAGNOSIS — H16223 Keratoconjunctivitis sicca, not specified as Sjogren's, bilateral: Secondary | ICD-10-CM | POA: Diagnosis not present

## 2015-01-28 DIAGNOSIS — H2513 Age-related nuclear cataract, bilateral: Secondary | ICD-10-CM | POA: Diagnosis not present

## 2015-01-28 DIAGNOSIS — H04123 Dry eye syndrome of bilateral lacrimal glands: Secondary | ICD-10-CM | POA: Diagnosis not present

## 2015-02-09 DIAGNOSIS — Z853 Personal history of malignant neoplasm of breast: Secondary | ICD-10-CM | POA: Diagnosis not present

## 2015-02-16 ENCOUNTER — Encounter: Payer: Self-pay | Admitting: Family Medicine

## 2015-03-01 DIAGNOSIS — H269 Unspecified cataract: Secondary | ICD-10-CM

## 2015-03-01 HISTORY — DX: Unspecified cataract: H26.9

## 2015-03-18 DIAGNOSIS — L821 Other seborrheic keratosis: Secondary | ICD-10-CM | POA: Diagnosis not present

## 2015-03-18 DIAGNOSIS — L719 Rosacea, unspecified: Secondary | ICD-10-CM | POA: Diagnosis not present

## 2015-03-18 DIAGNOSIS — Z85828 Personal history of other malignant neoplasm of skin: Secondary | ICD-10-CM | POA: Diagnosis not present

## 2015-03-18 DIAGNOSIS — D485 Neoplasm of uncertain behavior of skin: Secondary | ICD-10-CM | POA: Diagnosis not present

## 2015-03-18 DIAGNOSIS — D225 Melanocytic nevi of trunk: Secondary | ICD-10-CM | POA: Diagnosis not present

## 2015-03-18 DIAGNOSIS — Z23 Encounter for immunization: Secondary | ICD-10-CM | POA: Diagnosis not present

## 2015-03-18 DIAGNOSIS — D1801 Hemangioma of skin and subcutaneous tissue: Secondary | ICD-10-CM | POA: Diagnosis not present

## 2015-03-23 DIAGNOSIS — H2511 Age-related nuclear cataract, right eye: Secondary | ICD-10-CM | POA: Diagnosis not present

## 2015-03-27 ENCOUNTER — Encounter: Payer: Self-pay | Admitting: Dermatology

## 2015-05-19 DIAGNOSIS — H2511 Age-related nuclear cataract, right eye: Secondary | ICD-10-CM | POA: Diagnosis not present

## 2015-05-20 DIAGNOSIS — H25012 Cortical age-related cataract, left eye: Secondary | ICD-10-CM | POA: Diagnosis not present

## 2015-05-20 DIAGNOSIS — H2512 Age-related nuclear cataract, left eye: Secondary | ICD-10-CM | POA: Diagnosis not present

## 2015-05-20 DIAGNOSIS — H25042 Posterior subcapsular polar age-related cataract, left eye: Secondary | ICD-10-CM | POA: Diagnosis not present

## 2015-05-26 DIAGNOSIS — Z961 Presence of intraocular lens: Secondary | ICD-10-CM | POA: Diagnosis not present

## 2015-05-26 DIAGNOSIS — H2511 Age-related nuclear cataract, right eye: Secondary | ICD-10-CM | POA: Diagnosis not present

## 2015-06-02 DIAGNOSIS — H2512 Age-related nuclear cataract, left eye: Secondary | ICD-10-CM | POA: Diagnosis not present

## 2015-06-02 DIAGNOSIS — Z961 Presence of intraocular lens: Secondary | ICD-10-CM | POA: Diagnosis not present

## 2015-06-27 DIAGNOSIS — R05 Cough: Secondary | ICD-10-CM | POA: Diagnosis not present

## 2015-06-27 DIAGNOSIS — H6122 Impacted cerumen, left ear: Secondary | ICD-10-CM | POA: Diagnosis not present

## 2015-06-27 DIAGNOSIS — J019 Acute sinusitis, unspecified: Secondary | ICD-10-CM | POA: Diagnosis not present

## 2015-06-27 DIAGNOSIS — J3089 Other allergic rhinitis: Secondary | ICD-10-CM | POA: Diagnosis not present

## 2015-08-25 ENCOUNTER — Telehealth: Payer: Self-pay

## 2015-08-25 NOTE — Telephone Encounter (Signed)
Patient is on the list for [employer] 2017 and may be a good candidate for an AWV in 2017. Please let me know if/when appt is scheduled.   While reviewing the chart - history lists that the left breast had a lumpectomy, radiation.  Notes from [facility] state that there are postoperative lesions in the right breast.

## 2015-08-25 NOTE — Telephone Encounter (Signed)
Agree. Schedule pt for AMW.

## 2015-08-26 NOTE — Telephone Encounter (Signed)
Pt scheduled for AWV on 8/15 and CPE on 10/5, [place]

## 2015-08-27 NOTE — Telephone Encounter (Signed)
Thank you for scheduling

## 2015-09-17 DIAGNOSIS — R030 Elevated blood-pressure reading, without diagnosis of hypertension: Secondary | ICD-10-CM | POA: Diagnosis not present

## 2015-09-17 DIAGNOSIS — J029 Acute pharyngitis, unspecified: Secondary | ICD-10-CM | POA: Diagnosis not present

## 2015-10-13 ENCOUNTER — Ambulatory Visit: Payer: Medicare HMO

## 2015-11-03 ENCOUNTER — Ambulatory Visit: Payer: Medicare HMO

## 2015-11-24 ENCOUNTER — Ambulatory Visit (INDEPENDENT_AMBULATORY_CARE_PROVIDER_SITE_OTHER): Payer: Medicare HMO

## 2015-11-24 ENCOUNTER — Telehealth: Payer: Self-pay | Admitting: Family Medicine

## 2015-11-24 ENCOUNTER — Other Ambulatory Visit: Payer: Medicare HMO

## 2015-11-24 VITALS — BP 130/80 | HR 77 | Temp 97.8°F | Ht 65.5 in | Wt 208.8 lb

## 2015-11-24 DIAGNOSIS — Z853 Personal history of malignant neoplasm of breast: Secondary | ICD-10-CM | POA: Diagnosis not present

## 2015-11-24 DIAGNOSIS — E559 Vitamin D deficiency, unspecified: Secondary | ICD-10-CM

## 2015-11-24 DIAGNOSIS — Z0000 Encounter for general adult medical examination without abnormal findings: Secondary | ICD-10-CM

## 2015-11-24 DIAGNOSIS — Z1159 Encounter for screening for other viral diseases: Secondary | ICD-10-CM | POA: Diagnosis not present

## 2015-11-24 DIAGNOSIS — E785 Hyperlipidemia, unspecified: Secondary | ICD-10-CM

## 2015-11-24 DIAGNOSIS — R7309 Other abnormal glucose: Secondary | ICD-10-CM

## 2015-11-24 LAB — COMPREHENSIVE METABOLIC PANEL
ALK PHOS: 65 U/L (ref 39–117)
ALT: 16 U/L (ref 0–35)
AST: 18 U/L (ref 0–37)
Albumin: 3.7 g/dL (ref 3.5–5.2)
BILIRUBIN TOTAL: 0.5 mg/dL (ref 0.2–1.2)
BUN: 17 mg/dL (ref 6–23)
CO2: 32 mEq/L (ref 19–32)
CREATININE: 0.64 mg/dL (ref 0.40–1.20)
Calcium: 8.8 mg/dL (ref 8.4–10.5)
Chloride: 103 mEq/L (ref 96–112)
GFR: 97.42 mL/min (ref >60.00)
GLUCOSE: 108 mg/dL — AB (ref 70–99)
Potassium: 4.9 mEq/L (ref 3.5–5.1)
Sodium: 138 mEq/L (ref 135–145)
TOTAL PROTEIN: 7.1 g/dL (ref 6.0–8.3)

## 2015-11-24 LAB — CBC WITH DIFFERENTIAL/PLATELET
BASOS ABS: 0 10*3/uL (ref 0.0–0.1)
Basophils Relative: 0.5 % (ref 0.0–3.0)
EOS ABS: 0.2 10*3/uL (ref 0.0–0.7)
Eosinophils Relative: 2.4 % (ref 0.0–5.0)
HCT: 43 % (ref 36.0–46.0)
Hemoglobin: 14.6 g/dL (ref 12.0–15.0)
LYMPHS ABS: 2.7 10*3/uL (ref 0.7–4.0)
Lymphocytes Relative: 30.3 % (ref 12.0–46.0)
MCHC: 34 g/dL (ref 30.0–36.0)
MCV: 92.1 fl (ref 78.0–100.0)
MONOS PCT: 6.9 % (ref 3.0–12.0)
Monocytes Absolute: 0.6 10*3/uL (ref 0.1–1.0)
NEUTROS ABS: 5.3 10*3/uL (ref 1.4–7.7)
NEUTROS PCT: 59.9 % (ref 43.0–77.0)
PLATELETS: 327 10*3/uL (ref 150.0–400.0)
RBC: 4.66 Mil/uL (ref 3.87–5.11)
RDW: 13.2 % (ref 11.5–15.5)
WBC: 8.9 10*3/uL (ref 4.0–10.5)

## 2015-11-24 LAB — T3, FREE: T3 FREE: 2.7 pg/mL (ref 2.3–4.2)

## 2015-11-24 LAB — LIPID PANEL
Cholesterol: 216 mg/dL — ABNORMAL HIGH (ref 0–200)
HDL: 67.8 mg/dL (ref >39.00)
LDL Cholesterol: 121 mg/dL — ABNORMAL HIGH (ref 0–99)
NONHDL: 148.24
Total CHOL/HDL Ratio: 3
Triglycerides: 134 mg/dL (ref 0.0–149.0)
VLDL: 26.8 mg/dL (ref 0.0–40.0)

## 2015-11-24 LAB — HEMOGLOBIN A1C: HEMOGLOBIN A1C: 5.7 % (ref 4.6–6.5)

## 2015-11-24 LAB — VITAMIN D 25 HYDROXY (VIT D DEFICIENCY, FRACTURES): VITD: 15.44 ng/mL — AB (ref 30.00–100.00)

## 2015-11-24 LAB — TSH: TSH: 2.86 u[IU]/mL (ref 0.35–4.50)

## 2015-11-24 LAB — T4, FREE: FREE T4: 0.63 ng/dL (ref 0.60–1.60)

## 2015-11-24 NOTE — Progress Notes (Signed)
PCP notes:   Health maintenance:  Flu vaccine - pt desires to get vaccine at [facility] PCV13 - pt desires to get vaccine at [facility] Shingles - postponed/insurance Hep C screening - completed  Abnormal screenings:   Hearing - failed  Patient concerns:   None  Nurse concerns:  Addressed bone density, colonoscopy, and mammogram with pt. Pt encouraged to discuss these preventative health screenings in more detail at [facility] appt.   Next PCP appt:   12/03/2015 @ 1000

## 2015-11-24 NOTE — Progress Notes (Signed)
Subjective:   Hannah Green is a 70 y.o. female who presents for Medicare Annual (Subsequent) preventive examination.  Review of Systems:  N/A Cardiac Risk Factors include: obesity (BMI >30kg/m2);dyslipidemia;advanced age (>54mn, >>56women)     Objective:     Vitals: BP 130/80 (BP Location: Left Arm, Patient Position: Sitting, Cuff Size: Normal)   Pulse 77   Temp 97.8 F (36.6 C) (Oral)   Ht 5' 5.5" (1.664 m) Comment: no shoes  Wt 208 lb 12 oz (94.7 kg)   SpO2 97%   BMI 34.21 kg/m   Body mass index is 34.21 kg/m.   Tobacco History  Smoking Status  . Former Smoker  . Quit date: 03/31/2010  Smokeless Tobacco  . Never Used    Comment: has stopped but has started again in "crisis"     Counseling given: No   Past Medical History:  Diagnosis Date  . GERD (gastroesophageal reflux disease)   . Hyperlipidemia   . Rheumatoid arthritis(714.0)    Past Surgical History:  Procedure Laterality Date  . BREAST LUMPECTOMY  1999   left, needed radiation  . CATARACT EXTRACTION W/ INTRAOCULAR LENS  IMPLANT, BILATERAL Bilateral 05/19/15, 06/02/15  . CHOLECYSTECTOMY    . TONSILLECTOMY     Family History  Problem Relation Age of Onset  . Cancer Mother     breast  . Cancer Father     lung cancer  . Cancer Sister     throat cancer  . Diabetes Paternal Uncle   . Diabetes Maternal Grandmother    History  Sexual Activity  . Sexual activity: No    Outpatient Encounter Prescriptions as of 11/24/2015  Medication Sig  . albuterol (PROVENTIL HFA) 108 (90 BASE) MCG/ACT inhaler Inhale 2 puffs into the lungs every 6 (six) hours as needed for wheezing or shortness of breath. (Patient not taking: Reported on 11/24/2015)  . Fluticasone-Salmeterol (ADVAIR) 250-50 MCG/DOSE AEPB Inhale 1 puff into the lungs every 12 (twelve) hours. (Patient not taking: Reported on 11/24/2015)  . [DISCONTINUED] meloxicam (MOBIC) 7.5 MG tablet Take 1-2 tablets (7.5-15 mg total) by mouth daily. (Patient not  taking: Reported on 11/24/2015)   No facility-administered encounter medications on file as of 11/24/2015.     Activities of Daily Living In your present state of health, do you have any difficulty performing the following activities: 11/24/2015  Hearing? Y  Vision? N  Difficulty concentrating or making decisions? N  Walking or climbing stairs? N  Dressing or bathing? N  Doing errands, shopping? N  Preparing Food and eating ? N  Using the Toilet? N  In the past six months, have you accidently leaked urine? N  Do you have problems with loss of bowel control? N  Managing your Medications? N  Managing your Finances? N  Housekeeping or managing your Housekeeping? 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  Exercise Activities and Dietary recommendations Current Exercise Habits:  Home exercise routine, Type of exercise: strength training/weights;treadmill, Time (Minutes): 60, Frequency (Times/Week): 2, Weekly Exercise (Minutes/Week): 120, Intensity: Mild, Exercise limited by: None identified  Goals    . Increase physical activity          Starting 11/24/2015, I will continue to exercise for at least 60 min twice weekly.       Fall Risk Fall Risk  11/24/2015  Falls in the past year? No   Depression Screen PHQ 2/9 Scores 11/24/2015  PHQ - 2 Score 0     Cognitive Testing MMSE - Mini Mental State Exam 11/24/2015  Orientation to time 5  Orientation to Place 5  Registration 3  Attention/ Calculation 0  Recall 3  Language-  name 2 objects 0  Language- repeat 1  Language- follow 3 step command 3  Language- read & follow direction 0  Write a sentence 0  Copy design 0  Total score 20   PLEASE NOTE: A Mini-Cog screen was completed. Maximum score is 20. A value of 0 denotes this part of Folstein MMSE was not completed or the patient failed this part of the Mini-Cog screening.   Mini-Cog Screening Orientation to Time - Max 5 pts Orientation to Place - Max 5 pts Registration - Max 3 pts Recall - Max 3 pts Language Repeat - Max 1 pts Language Follow 3 Step Command - Max 3 pts  Immunization History  Administered Date(s) Administered  . Pneumococcal Polysaccharide-23 06/02/2011  . Td 06/02/2011   Screening Tests Health Maintenance  Topic Date Due  . INFLUENZA VACCINE  12/03/2015 (Originally 09/29/2015)  . PNA vac Low Risk Adult (2 of 2 - PCV13) 12/03/2015 (Originally 06/01/2012)  . ZOSTAVAX  11/23/2016 (Originally 07/30/2005)  . MAMMOGRAM  02/08/2017  . COLONOSCOPY  11/21/2020  . TETANUS/TDAP  06/01/2021  . DEXA SCAN  Completed  . Hepatitis C Screening  Completed      Plan:     I have personally reviewed and addressed the Medicare Annual Wellness questionnaire and have noted the following in the patient's chart:  A. Medical and social history B. Use of alcohol, tobacco or illicit drugs  C. Current medications and supplements D. Functional ability and status E.  Nutritional status F.  Physical activity G. Advance directives H. List of other physicians I.  Hospitalizations, surgeries, and ER visits in previous 12 months J.  [place] to include hearing, vision, cognitive, depression L. Referrals and appointments - none  In addition, I have reviewed and discussed with patient certain preventive protocols, quality metrics, and best practice recommendations. A written personalized care plan for preventive services as well as general preventive health recommendations were provided to  patient.  See attached scanned questionnaire for additional information.   Signed,   Lindell Noe, MHA, BS, LPN Health Advisor

## 2015-11-24 NOTE — Progress Notes (Signed)
Pre visit review using our clinic review tool, if applicable. No additional management support is needed unless otherwise documented below in the visit note. 

## 2015-11-24 NOTE — Progress Notes (Signed)
I reviewed health advisor's note, was available for consultation, and agree with documentation and plan.  Kendalynn Wideman, MD  [facility]  

## 2015-11-24 NOTE — Telephone Encounter (Signed)
-----   Message from Ellamae Sia sent at 11/19/2015  3:11 PM EDT ----- Regarding: Lab orders for Tuesday, 9.26.17 Patient is scheduled for CPX labs, please order future labs, Thanks , Karna Christmas

## 2015-11-24 NOTE — Patient Instructions (Signed)
Hannah Green , Thank you for taking time to come for your Medicare Wellness Visit. I appreciate your ongoing commitment to your health goals. Please review the following plan we discussed and let me know if I can assist you in the future.   These are the goals we discussed: Goals    . Increase physical activity          Starting 11/24/2015, I will continue to exercise for at least 60 min twice weekly.        This is a list of the screening recommended for you and due dates:  Health Maintenance  Topic Date Due  . Flu Shot  12/03/2015*  . Pneumonia vaccines (2 of 2 - PCV13) 12/03/2015*  . Shingles Vaccine  11/23/2016*  . Mammogram  02/08/2017  . Colon Cancer Screening  11/21/2020  . Tetanus Vaccine  06/01/2021  . DEXA scan (bone density measurement)  Completed  .  Hepatitis C: One time screening is recommended by Center for Disease Control  (CDC) for  adults born from [birth year] through 1965.   Completed  *Topic was postponed. The date shown is not the original due date.   Preventive Care for Adults  A healthy lifestyle and preventive care can promote health and wellness. Preventive health guidelines for adults include the following key practices.  . A routine yearly physical is a good way to check with your health care provider about your health and preventive screening. It is a chance to share any concerns and updates on your health and to receive a thorough exam.  . Visit your dentist for a routine exam and preventive care every 6 months. Brush your teeth twice a day and floss once a day. Good oral hygiene prevents tooth decay and gum disease.  . The frequency of eye exams is based on your age, health, family medical history, use  of contact lenses, and other factors. Follow your health care provider's ecommendations for frequency of eye exams.  . Eat a healthy diet. Foods like vegetables, fruits, whole grains, low-fat dairy products, and lean protein foods contain the nutrients you  need without too many calories. Decrease your intake of foods high in solid fats, added sugars, and salt. Eat the right amount of calories for you. Get information about a proper diet from your health care provider, if necessary.  . Regular physical exercise is one of the most important things you can do for your health. Most adults should get at least 150 minutes of moderate-intensity exercise (any activity that increases your heart rate and causes you to sweat) each week. In addition, most adults need muscle-strengthening exercises on 2 or more days a week.  Silver Sneakers may be a benefit available to you. To determine eligibility, you may visit the website: www.silversneakers.com or contact program at 416-448-1475 Mon-Fri between 8AM-8PM.   . Maintain a healthy weight. The body mass index (BMI) is a screening tool to identify possible weight problems. It provides an estimate of body fat based on height and weight. Your health care provider can find your BMI and can help you achieve or maintain a healthy weight.   For adults 20 years and older: ? A BMI below 18.5 is considered underweight. ? A BMI of 18.5 to 24.9 is normal. ? A BMI of 25 to 29.9 is considered overweight. ? A BMI of 30 and above is considered obese.   . Maintain normal blood lipids and cholesterol levels by exercising and minimizing your intake of saturated  fat. Eat a balanced diet with plenty of fruit and vegetables. Blood tests for lipids and cholesterol should begin at age 43 and be repeated every 5 years. If your lipid or cholesterol levels are high, you are over 50, or you are at high risk for heart disease, you may need your cholesterol levels checked more frequently. Ongoing high lipid and cholesterol levels should be treated with medicines if diet and exercise are not working.  . If you smoke, find out from your health care provider how to quit. If you do not use tobacco, please do not start.  . If you choose to drink  alcohol, please do not consume more than 2 drinks per day. One drink is considered to be 12 ounces (355 mL) of beer, 5 ounces (148 mL) of wine, or 1.5 ounces (44 mL) of liquor.  . If you are 29-49 years old, ask your health care provider if you should take aspirin to prevent strokes.  . Use sunscreen. Apply sunscreen liberally and repeatedly throughout the day. You should seek shade when your shadow is shorter than you. Protect yourself by wearing long sleeves, pants, a wide-brimmed hat, and sunglasses year round, whenever you are outdoors.  . Once a month, do a whole body skin exam, using a mirror to look at the skin on your back. Tell your health care provider of new moles, moles that have irregular borders, moles that are larger than a pencil eraser, or moles that have changed in shape or color.

## 2015-11-25 LAB — HEPATITIS C ANTIBODY: HCV Ab: NEGATIVE

## 2015-12-03 ENCOUNTER — Encounter: Payer: Self-pay | Admitting: *Deleted

## 2015-12-03 ENCOUNTER — Encounter: Payer: Self-pay | Admitting: Family Medicine

## 2015-12-03 ENCOUNTER — Ambulatory Visit (INDEPENDENT_AMBULATORY_CARE_PROVIDER_SITE_OTHER): Payer: Medicare HMO | Admitting: Family Medicine

## 2015-12-03 VITALS — BP 120/70 | HR 68 | Temp 98.5°F | Ht 65.5 in | Wt 210.5 lb

## 2015-12-03 DIAGNOSIS — R5383 Other fatigue: Secondary | ICD-10-CM

## 2015-12-03 DIAGNOSIS — E559 Vitamin D deficiency, unspecified: Secondary | ICD-10-CM

## 2015-12-03 DIAGNOSIS — R059 Cough, unspecified: Secondary | ICD-10-CM

## 2015-12-03 DIAGNOSIS — E2839 Other primary ovarian failure: Secondary | ICD-10-CM | POA: Diagnosis not present

## 2015-12-03 DIAGNOSIS — E7800 Pure hypercholesterolemia, unspecified: Secondary | ICD-10-CM

## 2015-12-03 DIAGNOSIS — R05 Cough: Secondary | ICD-10-CM

## 2015-12-03 LAB — VITAMIN B12: VITAMIN B 12: 421 pg/mL (ref 211–911)

## 2015-12-03 MED ORDER — AZITHROMYCIN 250 MG PO TABS: ORAL_TABLET | ORAL | 0 refills | Status: DC

## 2015-12-03 MED ORDER — VITAMIN D (ERGOCALCIFEROL) 1.25 MG (50000 UNIT) PO CAPS: 50000.0000 [IU] | ORAL_CAPSULE | ORAL | 0 refills | Status: DC

## 2015-12-03 NOTE — Assessment & Plan Note (Addendum)
Due for spirometry.. Unable to assess today given machine broken. Pt will return for eval.

## 2015-12-03 NOTE — Assessment & Plan Note (Signed)
CBC and thyroid testing nml.  Vit D low: replete  Eval B12.  May be due to poor sleep.  Increase exercise and healthy sleep hygiene.  Return for further eval if not improving in next few months.

## 2015-12-03 NOTE — Patient Instructions (Addendum)
Work on low carb, low chol diet, increase exercise and work on weight loss.  Start weekly vit D prescription x 12 weeks. When completed start OTC vit D 400 IU twice daily.  Stop at lab on way out for B12.  Schedule mammogram on your own in 01/2016.  Stop at front to set up bone density.  Call GI for colonoscopy repeat.  Complete antibitoics.

## 2015-12-03 NOTE — Assessment & Plan Note (Signed)
Work on healthy eating and weight loss.

## 2015-12-03 NOTE — Assessment & Plan Note (Signed)
Replete.. May be cause / contributing to fatigue

## 2015-12-03 NOTE — Assessment & Plan Note (Signed)
Given sputum change in  Pt with COPD... Will treat with Z-pack. No current indication for prednisone.

## 2015-12-03 NOTE — Progress Notes (Signed)
Earlier  she saw Hannah Musa, LPN for medicare wellness. Note  reviewed in detail when completed 11/24/15.  Flu vaccine - pt desires to get vaccine at [facility] PCV13 - pt desires to get vaccine at [facility] Shingles - postponed/insurance Hep C screening - completed Abnormal screenings:  Hearing - failed Patient concerns:  None Nurse concerns: Addressed bone density, colonoscopy, and mammogram with pt. Pt encouraged to discuss these preventative health screenings in more detail at [facility] appt.    Today:  She feels pretty good overall. Has the below concerns.  Elevated Cholesterol:  At goal < 130 on no med. Lab Results  Component Value Date   CHOL 216 (H) 11/24/2015   HDL 67.80 11/24/2015   LDLCALC 121 (H) 11/24/2015   LDLDIRECT 136.2 06/03/2011   TRIG 134.0 11/24/2015   CHOLHDL 3 11/24/2015  Using medications without problems: Muscle aches:  Diet compliance: moderate Exercise: 2 times a week Other complaints:  Prediabetes: CBG 108   COPD: now nonsmoking in last 3 years. Last spirometry: never  In last few weeks she has been coughing more, productive green.  No fever. Feels SOB and occ wheeze.    She is tired all the time and is not sleeping well in night I last few months.  She does have some peripheral swelling. She feels colder than others. Very dry skin. No known bleeding. Sister with hypothyroid.   Social History /Family History/Past Medical History reviewed and updated if needed.  Review of Systems  Constitutional: Negative for fever and fatigue.  HENT: Negative for ear pain.   Eyes: Negative for pain.  Respiratory: Negative for chest tightness and shortness of breath.   Cardiovascular: Negative for chest pain, palpitations and leg swelling.  Gastrointestinal: Negative for abdominal pain.  Genitourinary: Negative for dysuria.       Objective:   Physical Exam  Constitutional: Vital signs are normal. She appears well-developed and well-nourished. She is  cooperative.  Non-toxic appearance. She does not appear ill. No distress.  HENT:  Head: Normocephalic.  Right Ear: Hearing, tympanic membrane, external ear and ear canal normal.  Left Ear: Hearing, tympanic membrane, external ear and ear canal normal.  Nose: Nose normal.  Eyes: Conjunctivae, EOM and lids are normal. Pupils are equal, round, and reactive to light. No foreign bodies found.  Neck: Trachea normal and normal range of motion. Neck supple. Carotid bruit is not present. No mass and no thyromegaly present.  Cardiovascular: Normal rate, regular rhythm, S1 normal, S2 normal, normal heart sounds and intact distal pulses.  Exam reveals no gallop.   No murmur heard. Pulmonary/Chest: Effort normal and breath sounds normal. No respiratory distress. She has no wheezes. She has no rhonchi. She has no rales.  Abdominal: Soft. Normal appearance and bowel sounds are normal. She exhibits no distension, no fluid wave, no abdominal bruit and no mass. There is no hepatosplenomegaly. There is no tenderness. There is no rebound, no guarding and no CVA tenderness. No hernia.  Lymphadenopathy:    She has no cervical adenopathy.    She has no axillary adenopathy.  Neurological: She is alert. She has normal strength. No cranial nerve deficit or sensory deficit.  Skin: Skin is warm, dry and intact. No rash noted.  Psychiatric: Her speech is normal and behavior is normal. Judgment normal. Her mood appears not anxious. Cognition and memory are normal. She does not exhibit a depressed mood.          Assessment & Plan:  The patient's preventative maintenance  and recommended screening tests for an annual wellness exam were reviewed in full today. Brought up to date unless services declined.  Counselled on the importance of diet, exercise, and its role in overall health and mortality. The patient's FH and SH was reviewed, including their home life, tobacco status, and drug and alcohol status.    Vaccines:  PCV13 and flu to be given today. Due for shingles.. Refused not covered by insurance. Mammo:  nml 2016, due now Hx of breast CA. Last pap age 76, no further indicated,  No DVE , low risk, no family history of ovarian or uterine cancer. DEXA: Last many years ago was normal. Due for re-eval. Colon: Last 02/2010 nml, Dr. Collene Mares. Repeat in 5 years, due now.. Hep C: neg

## 2015-12-03 NOTE — Progress Notes (Signed)
Pre visit review using our clinic review tool, if applicable. No additional management support is needed unless otherwise documented below in the visit note. 

## 2015-12-22 ENCOUNTER — Encounter: Payer: Self-pay | Admitting: Family Medicine

## 2015-12-22 ENCOUNTER — Ambulatory Visit (INDEPENDENT_AMBULATORY_CARE_PROVIDER_SITE_OTHER): Payer: Medicare HMO | Admitting: Family Medicine

## 2015-12-22 VITALS — BP 128/70 | HR 66 | Temp 97.7°F | Ht 65.5 in | Wt 211.0 lb

## 2015-12-22 DIAGNOSIS — Z23 Encounter for immunization: Secondary | ICD-10-CM | POA: Diagnosis not present

## 2015-12-22 DIAGNOSIS — R5383 Other fatigue: Secondary | ICD-10-CM

## 2015-12-22 DIAGNOSIS — L719 Rosacea, unspecified: Secondary | ICD-10-CM | POA: Diagnosis not present

## 2015-12-22 DIAGNOSIS — R05 Cough: Secondary | ICD-10-CM

## 2015-12-22 DIAGNOSIS — R0602 Shortness of breath: Secondary | ICD-10-CM

## 2015-12-22 DIAGNOSIS — R059 Cough, unspecified: Secondary | ICD-10-CM

## 2015-12-22 MED ORDER — METRONIDAZOLE 1 % EX GEL: Freq: Every day | CUTANEOUS | 0 refills | Status: DC

## 2015-12-22 MED ORDER — VITAMIN D3 1.25 MG (50000 UT) PO CAPS: ORAL_CAPSULE | ORAL | 0 refills | Status: DC

## 2015-12-22 NOTE — Assessment & Plan Note (Signed)
Spirometry shows no COPD.  Nml cbc, TSH.  Will eval with ECHO of heart.  No chest pain or sign of ongoing ischemia.

## 2015-12-22 NOTE — Assessment & Plan Note (Signed)
Treat with topical metro gel.. Info given.

## 2015-12-22 NOTE — Progress Notes (Signed)
   Subjective:    Patient ID: Hannah Green, female    DOB: [birth year], 70 y.o.   MRN: [id number]  HPI  70 year old female presents for follow up ? COPD.  Recent bronchitis treated with  Z pack.  Today she reports that she feels much better.  Still with some cough, mild shortness of breath with exertion over the weekend.   No wheeze.  No fever.  Quit 3 years ago. Smoked 50 years. She does feel SOB at baseline when she is not sick even.  Has albuterol inhaler to use as needed.. But forgets to have around when walking.    Still feeling tired overall, B12 w in nml range.   Due for vaccines today.   Review of Systems  Constitutional: Negative for fatigue and fever.  HENT: Negative for ear pain.   Eyes: Negative for pain.  Respiratory: Negative for chest tightness and shortness of breath.   Cardiovascular: Negative for chest pain, palpitations and leg swelling.  Gastrointestinal: Negative for abdominal pain.  Genitourinary: Negative for dysuria.       Objective:   Physical Exam  Constitutional: Vital signs are normal. She appears well-developed and well-nourished. She is cooperative.  Non-toxic appearance. She does not appear ill. No distress.  HENT:  Head: Normocephalic.  Right Ear: Hearing, tympanic membrane, external ear and ear canal normal. Tympanic membrane is not erythematous, not retracted and not bulging.  Left Ear: Hearing, tympanic membrane, external ear and ear canal normal. Tympanic membrane is not erythematous, not retracted and not bulging.  Nose: No mucosal edema or rhinorrhea. Right sinus exhibits no maxillary sinus tenderness and no frontal sinus tenderness. Left sinus exhibits no maxillary sinus tenderness and no frontal sinus tenderness.  Mouth/Throat: Uvula is midline, oropharynx is clear and moist and mucous membranes are normal.  Eyes: Conjunctivae, EOM and lids are normal. Pupils are equal, round, and reactive to light. Lids are everted and swept, no  foreign bodies found.  Neck: Trachea normal and normal range of motion. Neck supple. Carotid bruit is not present. No thyroid mass and no thyromegaly present.  Cardiovascular: Normal rate, regular rhythm, S1 normal, S2 normal, normal heart sounds, intact distal pulses and normal pulses.  Exam reveals no gallop and no friction rub.   No murmur heard. Pulmonary/Chest: Effort normal and breath sounds normal. No tachypnea. No respiratory distress. She has no decreased breath sounds. She has no wheezes. She has no rhonchi. She has no rales.  Abdominal: Soft. Normal appearance and bowel sounds are normal. There is no tenderness.  Neurological: She is alert.  Skin: Skin is warm, dry and intact. No rash noted.  Vascularity across nose and erythematous nodules on chin and sides of nose.    Psychiatric: Her speech is normal and behavior is normal. Judgment and thought content normal. Her mood appears not anxious. Cognition and memory are normal. She does not exhibit a depressed mood.          Assessment & Plan:

## 2015-12-22 NOTE — Addendum Note (Signed)
Addended by: Emelia Salisbury C on: 12/22/2015 10:34 AM   Modules accepted: Orders

## 2015-12-22 NOTE — Progress Notes (Signed)
Pre visit review using our clinic review tool, if applicable. No additional management support is needed unless otherwise documented below in the visit note. 

## 2015-12-22 NOTE — Assessment & Plan Note (Signed)
Neg lab eval. Sleeping well, no depression.  Will eval with echo given DOE as well.

## 2015-12-22 NOTE — Assessment & Plan Note (Signed)
Acute bronchitis resolved after antibiotics.

## 2015-12-22 NOTE — Patient Instructions (Addendum)
If interested in skin evaluation: try Dr. Allyson Sabal. Try topical antibiotic gel for skin changes. Work on  regular exercise  To improve conditioning.   Stop at front desk to get ECHO to evaluated heart function.  Rosacea Rosacea is a long-term (chronic) condition that affects the skin of the face, including the cheeks, nose, brow, and chin. This condition can also affect the eyes. Rosacea causes blood vessels near the surface of the skin to enlarge, which results in redness. CAUSES The cause of this condition is not known. Certain triggers can make rosacea worse, including:  Hot baths.  Exercise.  Sunlight.  Very hot or cold temperatures.  Hot or spicy foods and drinks.  Drinking alcohol.  Stress.  Taking blood pressure medicine.  Long-term use of topical steroids on the face. RISK FACTORS This condition is more likely to develop in:  People who are older than 70 years of age.  Women.  People who have light-colored skin (light complexion).  People who have a family history of rosacea. SYMPTOMS  Symptoms of this condition include:  Redness of the face.  Red bumps or pimples on the face.  A red, enlarged nose.  Blushing easily.  Red lines on the skin.  Irritated or burning feeling in the eyes.  Swollen eyelids. DIAGNOSIS This condition is diagnosed with a medical history and physical exam. TREATMENT There is no cure for this condition, but treatment can help to control your symptoms. Your health care provider may recommend that you see a skin specialist (dermatologist). Treatment may include:  Antibiotic medicines that are applied to the skin or taken as a pill.  Laser treatment to improve the appearance of the skin.  Surgery. This is rare. Your health care provider will also recommend the best way to take care of your skin. Even after your skin improves, you will likely need to continue treatment to prevent your rosacea from coming back. HOME CARE  INSTRUCTIONS Skin Care Take care of your skin as told by your health care provider. You may be told to do these things:  Wash your skin gently two or more times each day.  Use mild soap.  Use a sunscreen or sunblock with SPF 30 or greater.  Use gentle cosmetics that are meant for sensitive skin.  Shave with an electric shaver instead of a blade. Lifestyle  Try to keep track of what foods trigger this condition. Avoid any triggers. These may include:  Spicy foods.  Seafood.  Cheese.  Hot liquids.  Nuts.  Chocolate.  Iodized salt.  Do not drink alcohol.  Avoid extremely cold or hot temperatures.  Try to reduce your stress. If you need help, talk with your health care provider.  When you exercise, do these things to stay cool:  Limit your sun exposure.  Use a fan.  Do shorter and more frequent intervals of exercise. General Instructions   Keep all follow-up visits as told by your health care provider. This is important.  Take over-the-counter and prescription medicines only as told by your health care provider.  If your eyelids are affected, apply warm compresses to them. Do this as told by your health care provider.  If you were prescribed an antibiotic medicine, apply or take it as told by your health care provider. Do not stop using the antibiotic even if your condition improves. SEEK MEDICAL CARE IF:  Your symptoms get worse.  Your symptoms do not improve after two months of treatment.  You have new symptoms.  You have any changes in vision or you have problems with your eyes, such as redness or itching.  You feel depressed.  You lose your appetite.  You have trouble concentrating.   This information is not intended to replace advice given to you by your health care provider. Make sure you discuss any questions you have with your health care provider.   Document Released: 03/24/2004 Document Revised: 11/05/2014 Document Reviewed:  04/23/2014 Elsevier Interactive Patient Education [employer].

## 2016-01-04 DIAGNOSIS — M8589 Other specified disorders of bone density and structure, multiple sites: Secondary | ICD-10-CM | POA: Diagnosis not present

## 2016-01-12 ENCOUNTER — Encounter: Payer: Self-pay | Admitting: Family Medicine

## 2016-01-12 ENCOUNTER — Ambulatory Visit (HOSPITAL_COMMUNITY): Payer: Medicare HMO | Attending: Cardiovascular Disease

## 2016-01-12 ENCOUNTER — Other Ambulatory Visit: Payer: Self-pay

## 2016-01-12 DIAGNOSIS — I517 Cardiomegaly: Secondary | ICD-10-CM | POA: Diagnosis not present

## 2016-01-12 DIAGNOSIS — M85859 Other specified disorders of bone density and structure, unspecified thigh: Secondary | ICD-10-CM | POA: Insufficient documentation

## 2016-01-12 DIAGNOSIS — R0602 Shortness of breath: Secondary | ICD-10-CM | POA: Diagnosis not present

## 2016-01-12 DIAGNOSIS — Z87891 Personal history of nicotine dependence: Secondary | ICD-10-CM | POA: Diagnosis not present

## 2016-01-12 DIAGNOSIS — R0600 Dyspnea, unspecified: Secondary | ICD-10-CM | POA: Diagnosis present

## 2016-01-14 ENCOUNTER — Telehealth: Payer: Self-pay | Admitting: Family Medicine

## 2016-01-14 DIAGNOSIS — R0600 Dyspnea, unspecified: Secondary | ICD-10-CM

## 2016-01-14 DIAGNOSIS — I5032 Chronic diastolic (congestive) heart failure: Secondary | ICD-10-CM

## 2016-01-14 DIAGNOSIS — R0609 Other forms of dyspnea: Principal | ICD-10-CM

## 2016-01-14 NOTE — Telephone Encounter (Signed)
Pt agreeable to cardiology referral.

## 2016-01-29 HISTORY — PX: NM MYOVIEW LTD: HXRAD82

## 2016-02-01 ENCOUNTER — Ambulatory Visit (INDEPENDENT_AMBULATORY_CARE_PROVIDER_SITE_OTHER): Payer: Medicare HMO | Admitting: Cardiology

## 2016-02-01 ENCOUNTER — Encounter: Payer: Self-pay | Admitting: Cardiology

## 2016-02-01 VITALS — BP 146/70 | HR 72 | Ht 67.0 in | Wt 216.6 lb

## 2016-02-01 DIAGNOSIS — R0609 Other forms of dyspnea: Secondary | ICD-10-CM | POA: Diagnosis not present

## 2016-02-01 DIAGNOSIS — E784 Other hyperlipidemia: Secondary | ICD-10-CM

## 2016-02-01 DIAGNOSIS — I519 Heart disease, unspecified: Secondary | ICD-10-CM

## 2016-02-01 DIAGNOSIS — E7849 Other hyperlipidemia: Secondary | ICD-10-CM

## 2016-02-01 DIAGNOSIS — I5189 Other ill-defined heart diseases: Secondary | ICD-10-CM | POA: Insufficient documentation

## 2016-02-01 DIAGNOSIS — R079 Chest pain, unspecified: Secondary | ICD-10-CM | POA: Diagnosis not present

## 2016-02-01 DIAGNOSIS — R0600 Dyspnea, unspecified: Secondary | ICD-10-CM

## 2016-02-01 NOTE — Assessment & Plan Note (Signed)
Usually preceded by dyspnea, she does have some of this occasional chest discomfort which makes feel a bit more leery of this potentially being anginal in nature. Somewhat on the fence, but I think would prefer to proceed with noninvasive evaluation with treadmill Myoview. She is not on any cardiac medications. With the concern that this may be anginal, we may need to be a little more aggressive with lipid control as well as consider potential antianginal medication such as beta blocker.  Plan: Treadmill Myoview

## 2016-02-01 NOTE — Assessment & Plan Note (Signed)
She is artery had an evaluation for COPD. CBC was also normal. Echocardiogram did show grade 2 diastolic dysfunction which could explain exertional dyspnea, however without significant LVH noted, we need to exclude an ischemic etiology for diastolic dysfunction. Could be micro-versus macrovascular disease.  Plan: Treadmill Myoview with low-hold for proceeding to cardiac catheterization if abnormal results noted

## 2016-02-01 NOTE — Progress Notes (Signed)
PCP: Eliezer Lofts, MD  Clinic Note: Chief Complaint  Patient presents with  . New Patient (Initial Visit)    referred by primary for abnormal echo  . Chest Pain    lifting, gym activities  . Shortness of Breath    lifting gym activities     HPI: Hannah Green is a 70 y.o. female with a PMH below who presents today for Cardiology evaluation for SOB/CP.Marland Kitchen  Hannah Green was Referred by her PCP for chronic dyspnea and fatigue that has been getting worse over last few months. She ordered an echocardiogram which showed grade 2 diastolic dysfunction.  Recent Hospitalizations: None  Studies Reviewed:   Echo 01/12/2016: Mild LVH, EF 60-65%, GR 2 DD  Interval History: Hannah Green presents today with complaints of continuously worsening dyspnea with exertion. She's been trying to go to the gym and do her routine exercises and she is not able to do it. She really doesn't have any resting dyspnea but assuming she starts exert herself she notes dyspnea and occasional chest discomfort if she goes even further. At least couple times she is noted that her heart rate will go up exceedingly fast with exertion mask usually when she feels the discomfort in her chest. She says her blood pressures usually much lower than it is today, indicating that for the last 2 visits with her PCP has been in the 120s over 55s. She does not really have any other risk factors besides being a former smoker. In addition to feeling short of breath she really just gets fatigued very easily when she is doing her workouts. She very much once to continue to do her workouts so she can get back in shape and lose weight, but is very scared to do so now.   Other than her exertional dyspnea and chest discomfort with rapid heart rates during exertion she denies any resting symptoms of PND orthopnea or edema, dyspnea. No other rapid irregular heartbeats or palpitations, lightheadedness, dizziness , syncope/near syncope, or TIA /  amaurosis fugax.  No claudication.  ROS: A comprehensive was performed. Review of Systems  Constitutional: Negative for chills, fever and malaise/fatigue.  HENT: Negative.   Respiratory: Positive for shortness of breath. Negative for cough and wheezing.        Per history of present illness  Cardiovascular: Positive for chest pain and leg swelling (Minimal).       Per history of present illness  Gastrointestinal: Negative.  Negative for blood in stool and melena.  Genitourinary: Negative.  Negative for hematuria.  Musculoskeletal: Negative.   Skin: Negative.   Neurological: Negative.   Endo/Heme/Allergies: Negative.   Psychiatric/Behavioral: Negative.   All other systems reviewed and are negative.   Past Medical History:  Diagnosis Date  . GERD (gastroesophageal reflux disease)   . Hyperlipidemia   . Rheumatoid arthritis(714.0)     Past Surgical History:  Procedure Laterality Date  . BREAST LUMPECTOMY  1999   left, needed radiation  . CATARACT EXTRACTION W/ INTRAOCULAR LENS  IMPLANT, BILATERAL Bilateral 05/19/15, 06/02/15  . CHOLECYSTECTOMY    . TONSILLECTOMY    . TRANSTHORACIC ECHOCARDIOGRAM  01/12/2016   Mild LVH. EF 60-65%. GR 2-DD   Current Meds  Medication Sig  . albuterol (PROVENTIL HFA) 108 (90 BASE) MCG/ACT inhaler Inhale 2 puffs into the lungs every 6 (six) hours as needed for wheezing or shortness of breath.  . Cholecalciferol (VITAMIN D3) 50000 units CAPS 1 cap weekly x 12 weeks  . Fluticasone-Salmeterol (ADVAIR)  250-50 MCG/DOSE AEPB Inhale 1 puff into the lungs every 12 (twelve) hours.  . metroNIDAZOLE (METROGEL) 1 % gel Apply topically daily.    Allergies  Allergen Reactions  . Varenicline Tartrate     REACTION: intolerant  . Penicillins     REACTION: As a child Vague rash only    Social History   Social History  . Marital status: Divorced    Spouse name: N/A  . Number of children: 2  . Years of education: N/A   Occupational History  .  Retired Writer   Social History Main Topics  . Smoking status: Former Smoker    Quit date: 03/31/2010  . Smokeless tobacco: Never Used     Comment: has stopped but has started again in "crisis"  . Alcohol use Yes  . Drug use: No  . Sexual activity: No   Other Topics Concern  . None   Social History Narrative   Lives alone   No living will   Requests friend Porfirio Mylar to make health care decisions.   Would accept resuscitation but no prolonged ventilation   Would not want tube feeds if cognitively unaware   Family History  Problem Relation Age of Onset  . Cancer Mother     breast  . Cancer Father     lung cancer  . Cancer Sister     throat cancer  . Diabetes Paternal Uncle   . Diabetes Maternal Grandmother     Wt Readings from Last 3 Encounters:  02/01/16 98.2 kg (216 lb 9.6 oz)  12/22/15 95.7 kg (211 lb)  12/03/15 95.5 kg (210 lb 8 oz)    PHYSICAL EXAM BP (!) 146/70 (BP Location: Left Arm, Cuff Size: Normal)   Pulse 72   Ht '5\' 7"'$  (1.702 m)   Wt 98.2 kg (216 lb 9.6 oz)   LMP  (LMP Unknown)   BMI 33.92 kg/m  General appearance: alert, cooperative, appears stated age, no distress and Mildly obese; well-nourished and well-groomed. Pleasant mood and affect. HEENT: [place]/AT, EOMI, MMM, anicteric sclera Neck: no adenopathy, no carotid bruit and no JVD Lungs: clear to auscultation bilaterally, normal percussion bilaterally and non-labored Heart: regular rate and rhythm, S1 and S2 normal, no murmur, click, rub or gallop; nondisplaced PMI Abdomen: soft, non-tender; bowel sounds normal; no masses,  no organomegaly; No HJR Extremities: extremities normal, atraumatic, no cyanosis, or edema Pulses: 2+ and symmetric;  Skin: mobility and turgor normal, no evidence of bleeding or bruising and no lesions noted  Neurologic: Mental status: Alert, oriented, thought content appropriate Cranial nerves: normal (II-XII grossly intact)    Adult ECG Report  Rate:  72 ;  Rhythm: normal sinus rhythm and Septal infarct - age undetermined.  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 She has no PND or orthopnea symptoms. His only exertional. Diastolic dysfunction could be ischemic but that would not be seen on resting echocardiogram  is much as with exertion. Diastolic dysfunction could be ischemic in nature, therefore we will evaluate for ischemia with a Myoview stress test. If that is normal suggesting no macrovascular disease, would also consider aggressive treatment of potential microvascular disease with lipid management, more aggressive blood pressure control etc.  Would probably benefit from beta blocker, statin and aspirin.      Relevant Orders   EKG 12-Lead   Myocardial Perfusion Imaging   Hyperlipidemia (Chronic)    Plan for now is to do less modification, however with concerning symptoms for possible angina, we may need to be a little more aggressive depending what the stress test results show. For now we'll simply continue dietary modification and exercise at the recommendation. Unfortunately, she has not been able to do the exercise because of exertional dyspnea and chest pain. We need e to exclude ischemia.      DOE (dyspnea on exertion) - Primary    She is artery had an evaluation for COPD. CBC was also normal. Echocardiogram did show grade 2 diastolic dysfunction which could explain exertional  dyspnea, however without significant LVH noted, we need to exclude an ischemic etiology for diastolic dysfunction. Could be micro-versus macrovascular disease.  Plan: Treadmill Myoview with low-hold for proceeding to cardiac catheterization if abnormal results noted      Relevant Orders   EKG 12-Lead   Myocardial Perfusion Imaging   Chest pain with moderate risk for cardiac etiology    Usually preceded by dyspnea, she does have some of this occasional chest discomfort which makes feel a bit more leery of this potentially being anginal in nature. Somewhat on the fence, but I think would prefer to proceed with noninvasive evaluation with treadmill Myoview. She is not on any cardiac medications. With the concern that this may be anginal, we may need to be a little more aggressive with lipid control as well as consider potential antianginal medication such as beta blocker.  Plan: Treadmill Myoview      Relevant Orders   EKG 12-Lead   Myocardial Perfusion Imaging      Current medicines are reviewed at length with the patient today. (+/- concerns) none The following changes have been made: none  Patient Instructions  Schedule at [facility] has requested that you have en exercise stress myoview. For further information please visit [email or web address]. Please follow instruction sheet, as given.   NO OTHER CHANGES   Your physician recommends that you schedule a follow-up appointment in [place].   Studies Ordered:   Orders Placed This Encounter  Procedures  . Myocardial Perfusion Imaging  . EKG 12-Lead      Glenetta Hew, M.D., M.S. Interventional Cardiologist   Pager # [phone] Phone # [phone] [address] [place], [place] [address]

## 2016-02-01 NOTE — Assessment & Plan Note (Signed)
Interestingly should have diastolic heart failure without significant LVH. She has no PND or orthopnea symptoms. His only exertional. Diastolic dysfunction could be ischemic but that would not be seen on resting echocardiogram is much as with exertion. Diastolic dysfunction could be ischemic in nature, therefore we will evaluate for ischemia with a Myoview stress test. If that is normal suggesting no macrovascular disease, would also consider aggressive treatment of potential microvascular disease with lipid management, more aggressive blood pressure control etc.  Would probably benefit from beta blocker, statin and aspirin.

## 2016-02-01 NOTE — Patient Instructions (Addendum)
Schedule at [facility] has requested that you have en exercise stress myoview. For further information please visit [email or web address]. Please follow instruction sheet, as given.   NO OTHER CHANGES   Your physician recommends that you schedule a follow-up appointment in [place].

## 2016-02-01 NOTE — Assessment & Plan Note (Signed)
Plan for now is to do less modification, however with concerning symptoms for possible angina, we may need to be a little more aggressive depending what the stress test results show. For now we'll simply continue dietary modification and exercise at the recommendation. Unfortunately, she has not been able to do the exercise because of exertional dyspnea and chest pain. We need e to exclude ischemia.

## 2016-02-02 ENCOUNTER — Telehealth (HOSPITAL_COMMUNITY): Payer: Self-pay

## 2016-02-02 NOTE — Telephone Encounter (Signed)
Encounter complete. 

## 2016-02-04 ENCOUNTER — Ambulatory Visit (HOSPITAL_COMMUNITY)
Admission: RE | Admit: 2016-02-04 | Discharge: 2016-02-04 | Disposition: A | Discharge status: Home / Self Care | Payer: Medicare HMO | Source: Ambulatory Visit | Attending: Cardiology | Admitting: Cardiology

## 2016-02-04 DIAGNOSIS — I5189 Other ill-defined heart diseases: Secondary | ICD-10-CM

## 2016-02-04 DIAGNOSIS — E669 Obesity, unspecified: Secondary | ICD-10-CM | POA: Insufficient documentation

## 2016-02-04 DIAGNOSIS — R5383 Other fatigue: Secondary | ICD-10-CM | POA: Diagnosis not present

## 2016-02-04 DIAGNOSIS — Z87891 Personal history of nicotine dependence: Secondary | ICD-10-CM | POA: Diagnosis not present

## 2016-02-04 DIAGNOSIS — Z6833 Body mass index (BMI) 33.0-33.9, adult: Secondary | ICD-10-CM | POA: Insufficient documentation

## 2016-02-04 DIAGNOSIS — R9439 Abnormal result of other cardiovascular function study: Secondary | ICD-10-CM | POA: Insufficient documentation

## 2016-02-04 DIAGNOSIS — R079 Chest pain, unspecified: Secondary | ICD-10-CM

## 2016-02-04 DIAGNOSIS — R0600 Dyspnea, unspecified: Secondary | ICD-10-CM

## 2016-02-04 DIAGNOSIS — I519 Heart disease, unspecified: Secondary | ICD-10-CM

## 2016-02-04 DIAGNOSIS — R0609 Other forms of dyspnea: Secondary | ICD-10-CM | POA: Diagnosis not present

## 2016-02-04 LAB — MYOCARDIAL PERFUSION IMAGING
CHL CUP NUCLEAR SRS: 0
CHL CUP NUCLEAR SSS: 3
CHL CUP RESTING HR STRESS: 67 {beats}/min
CHL CUP STRESS STAGE 1 GRADE: 0 %
CHL CUP STRESS STAGE 1 HR: 73 {beats}/min
CHL CUP STRESS STAGE 2 GRADE: 0 %
CHL CUP STRESS STAGE 2 SPEED: 1 mph
CHL CUP STRESS STAGE 3 GRADE: 0 %
CHL CUP STRESS STAGE 4 DBP: 70 mmHg
CHL CUP STRESS STAGE 4 GRADE: 10 %
CHL CUP STRESS STAGE 5 SBP: 118 mmHg
CHL CUP STRESS STAGE 5 SPEED: 2.5 mph
CHL CUP STRESS STAGE 6 GRADE: 12 %
CHL CUP STRESS STAGE 6 SPEED: 1.9 mph
CHL CUP STRESS STAGE 7 DBP: 72 mmHg
CHL CUP STRESS STAGE 7 GRADE: 0 %
CHL CUP STRESS STAGE 7 HR: 117 {beats}/min
CHL CUP STRESS STAGE 7 SBP: 163 mmHg
CHL CUP STRESS STAGE 8 DBP: 72 mmHg
CHL CUP STRESS STAGE 8 SBP: 163 mmHg
CHL CUP STRESS STAGE 9 HR: 84 {beats}/min
CHL RATE OF PERCEIVED EXERTION: 17
CSEPED: 5 min
CSEPEDS: 19 s
CSEPEW: 6.5 METS
CSEPHR: 91 %
CSEPPHR: 136 {beats}/min
CSEPPMHR: 90 %
LV dias vol: 88 mL (ref 46–106)
LV sys vol: 32 mL
MPHR: 150 {beats}/min
SDS: 3
Stage 1 DBP: 82 mmHg
Stage 1 SBP: 162 mmHg
Stage 1 Speed: 0 mph
Stage 2 HR: 73 {beats}/min
Stage 3 HR: 73 {beats}/min
Stage 3 Speed: 1 mph
Stage 4 HR: 120 {beats}/min
Stage 4 SBP: 211 mmHg
Stage 4 Speed: 1.7 mph
Stage 5 DBP: 75 mmHg
Stage 5 Grade: 12 %
Stage 5 HR: 137 {beats}/min
Stage 6 HR: 136 {beats}/min
Stage 7 Speed: 0 mph
Stage 8 Grade: 0 %
Stage 8 HR: 90 {beats}/min
Stage 8 Speed: 0 mph
Stage 9 DBP: 69 mmHg
Stage 9 Grade: 0 %
Stage 9 SBP: 160 mmHg
Stage 9 Speed: 0 mph
TID: 1.16

## 2016-02-04 MED ORDER — TECHNETIUM TC 99M TETROFOSMIN IV KIT
31.2000 | PACK | Freq: Once | INTRAVENOUS | Status: AC | PRN
  Administered 2016-02-04: 31.2 via INTRAVENOUS
  Filled 2016-02-04: qty 32

## 2016-02-04 MED ORDER — TECHNETIUM TC 99M TETROFOSMIN IV KIT
10.2000 | PACK | Freq: Once | INTRAVENOUS | Status: AC | PRN
  Administered 2016-02-04: 10.2 via INTRAVENOUS
  Filled 2016-02-04: qty 11

## 2016-02-19 DIAGNOSIS — H10012 Acute follicular conjunctivitis, left eye: Secondary | ICD-10-CM | POA: Diagnosis not present

## 2016-03-22 ENCOUNTER — Ambulatory Visit (INDEPENDENT_AMBULATORY_CARE_PROVIDER_SITE_OTHER): Payer: Medicare HMO | Admitting: Cardiology

## 2016-03-22 ENCOUNTER — Encounter: Payer: Self-pay | Admitting: Cardiology

## 2016-03-22 DIAGNOSIS — R0600 Dyspnea, unspecified: Secondary | ICD-10-CM

## 2016-03-22 DIAGNOSIS — R0789 Other chest pain: Secondary | ICD-10-CM | POA: Diagnosis not present

## 2016-03-22 DIAGNOSIS — R0609 Other forms of dyspnea: Secondary | ICD-10-CM

## 2016-03-22 DIAGNOSIS — I519 Heart disease, unspecified: Secondary | ICD-10-CM | POA: Diagnosis not present

## 2016-03-22 DIAGNOSIS — I5189 Other ill-defined heart diseases: Secondary | ICD-10-CM

## 2016-03-22 MED ORDER — CO Q-10 100 MG PO CAPS: 300.0000 mg | ORAL_CAPSULE | Freq: Every day | ORAL | 3 refills | Status: DC

## 2016-03-22 NOTE — Assessment & Plan Note (Signed)
She was still trying to lifestyle modification. In light of her borderline Myoview would like to try some assistance to lifestyle modification in the form coenzyme Q 10. Would need to closely monitor and potentially consider medical therapy.Marland Kitchen

## 2016-03-22 NOTE — Patient Instructions (Signed)
MEDICATION  ADDITION   CoQ10  START WITH 100 MG ONE CAPSULE FOR 2 WEEKS THEN INCREASE BY 100  MG EVERY 2 WEEKS UNTIL YOU REACH 300 MG DAILY.    NO OTHER CHANGES   SYMPTOMS CONTINUE WILL POSSIBLE SCHEDULE CARDIAC CATHETERIZATION  Your physician recommends that you schedule a follow-up appointment in April 2018 [place].     If you need a refill on your cardiac medications before your next appointment, please call your pharmacy.

## 2016-03-22 NOTE — Assessment & Plan Note (Signed)
Mostly normal echocardiogram with some diastolic dysfunction and no ischemia or infarction noted on stress test. But she did have is initial hypertensive response to exercise followed by hypotension which could be consistent with coronary disease - either macro or microvascular.  At this point I think risk factor modification is the best option. She would like to avoid invasive evaluation unless symptoms do not get better with her exercise regimen. She would like to wait for now reassess in several months to see how she's doing, if her progression is not getting any better and still having the exertional fatigue and dyspnea, we would consider invasive evaluation with cardiac catheterization versus CTA.  For cardiac risk factor modification, her blood pressure is relatively well-controlled, she is obese and is working on trying to lose weight, her lipids are borderline controlled and she would prefer not going on medications at this point. I recommended that she start off with coenzyme Q 10. Beginning and reassess her lipids after several months to see where they stand. Would likely try to target an LDL closer to 100.   I don't think we need to start an antihypertensive agent at this time, and therefore I'm reluctant to consider cardiac catheterization unless he were to start something. --Would likely not use beta blocker however because of her fatigue and exertional dyspnea.

## 2016-03-22 NOTE — Assessment & Plan Note (Signed)
Her chest discomfort is relatively atypical in that it is sharp, and not necessarily associated with exertion. No ischemia noted on stress test, therefore less inclined to think this is macrovascular related. However if her symptoms of exertional dyspnea and chest discomfort continue despite her efforts to lose weight and exercise, we may need to consider a more detailed anatomic evaluation with either coronary angiography from invasive or noninvasive approach.

## 2016-03-22 NOTE — Progress Notes (Signed)
PCP: Eliezer Lofts, MD   Clinic Note: Chief Complaint  Patient presents with  . Follow-up  . Chest Pain    pt states wvery now and then   . Shortness of Breath    no SOB      HPI: Hannah Green is a 71 y.o. female with a PMH below who presents today for Cardiology evaluation for SOB/CP.Marland Kitchen  Hannah Green was Referred by her PCP for chronic dyspnea and fatigue that has been getting worse over last few months. She ordered an echocardiogram which showed grade 2 diastolic dysfunction. Last saw her on 02/01/2016 for worsening exertional dyspnea and occasional chest discomfort-- Myoview ordered  Recent Hospitalizations: None  Studies Reviewed:   Myoview 02/08/2016: LOW RISK. Hypertensive response to exercise followed by significant drop (stage I pressure 211/70 drop to 118/75. Stage II). Apical artifact but otherwise no ischemia or infarction. EF 64%.  Interval History: Hannah Green presents today Actually feeling a little bit better than she was last saw her. She has started and excise program 5 days a week which she is now considering to be consistent with a current "job ". She does note some exertional dyspnea, but thinks it is getting better. She allergies being quite out of shape. She still has intermittent episodes of her sharp left-sided chest discomfort, but is not really associated with any exertion. Mostly she still mostly exertional dyspnea, but it has improved.  She no longer really notes any rapid irregular heartbeats or palpitation. She does note more exercise fatigue and dyspnea. Usually if she stops to rest and regroup she can go back to exercising.  She denies any resting symptoms of PND orthopnea or edema, dyspnea. No other rapid irregular heartbeats or palpitations, lightheadedness, dizziness , syncope/near syncope, or TIA / amaurosis fugax.  No claudication.  ROS: A comprehensive was performed. Review of Systems  Constitutional: Negative for chills, fever and  malaise/fatigue.  HENT: Negative.   Respiratory: Positive for shortness of breath. Negative for cough and wheezing.        Per history of present illness  Cardiovascular: Positive for chest pain and leg swelling (Minimal).       Per history of present illness  Gastrointestinal: Negative.  Negative for blood in stool and melena.  Genitourinary: Negative.  Negative for hematuria.  Musculoskeletal: Negative.   Skin: Negative.   Neurological: Negative.   Endo/Heme/Allergies: Negative.   Psychiatric/Behavioral: Negative.   All other systems reviewed and are negative.   Past Medical History:  Diagnosis Date  . GERD (gastroesophageal reflux disease)   . Hyperlipidemia   . Rheumatoid arthritis(714.0)     Past Surgical History:  Procedure Laterality Date  . BREAST LUMPECTOMY  1999   left, needed radiation  . CATARACT EXTRACTION W/ INTRAOCULAR LENS  IMPLANT, BILATERAL Bilateral 05/19/15, 06/02/15  . CHOLECYSTECTOMY    . TONSILLECTOMY    . TRANSTHORACIC ECHOCARDIOGRAM  01/12/2016   Mild LVH. EF 60-65%. GR 2-DD   Current Meds  Medication Sig  . albuterol (PROVENTIL HFA) 108 (90 BASE) MCG/ACT inhaler Inhale 2 puffs into the lungs every 6 (six) hours as needed for wheezing or shortness of breath.  . Fluticasone-Salmeterol (ADVAIR) 250-50 MCG/DOSE AEPB Inhale 1 puff into the lungs every 12 (twelve) hours.  . metroNIDAZOLE (METROGEL) 1 % gel Apply topically daily.    Allergies  Allergen Reactions  . Varenicline Tartrate     REACTION: intolerant  . Penicillins     REACTION: As a child Vague rash only  Social History   Social History  . Marital status: Divorced    Spouse name: N/A  . Number of children: 2  . Years of education: N/A   Occupational History  . Retired Writer   Social History Main Topics  . Smoking status: Former Smoker    Quit date: 03/31/2010  . Smokeless tobacco: Never Used     Comment: has stopped but has started again in "crisis"  .  Alcohol use Yes  . Drug use: No  . Sexual activity: No   Other Topics Concern  . None   Social History Narrative   Lives alone   No living will   Requests friend Hannah Green to make health care decisions.   Would accept resuscitation but no prolonged ventilation   Would not want tube feeds if cognitively unaware   Family History  Problem Relation Age of Onset  . Cancer Mother     breast  . Cancer Father     lung cancer  . Cancer Sister     throat cancer  . Diabetes Paternal Uncle   . Diabetes Maternal Grandmother     Wt Readings from Last 3 Encounters:  03/22/16 97.7 kg (215 lb 6.4 oz)  02/04/16 98 kg (216 lb)  02/01/16 98.2 kg (216 lb 9.6 oz)    PHYSICAL EXAM BP 130/70   Pulse 71   Ht '5\' 7"'$  (1.702 m)   Wt 97.7 kg (215 lb 6.4 oz)   LMP  (LMP Unknown)   SpO2 97%   BMI 33.74 kg/m  General appearance: alert, cooperative, appears stated age, no distress and Mildly obese; well-nourished and well-groomed. Pleasant mood and affect. HEENT: [place]/AT, EOMI, MMM, anicteric sclera Neck: no adenopathy, no carotid bruit and no JVD Lungs: clear to auscultation bilaterally, normal percussion bilaterally and non-labored Heart: regular rate and rhythm, S1 and S2 normal, no murmur, click, rub or gallop; nondisplaced PMI Abdomen: soft, non-tender; bowel sounds normal; no masses,  no organomegaly; No HJR Extremities: extremities normal, atraumatic, no cyanosis, or edema Pulses: 2+ and symmetric;  Skin: mobility and turgor normal, no evidence of bleeding or bruising and no lesions noted  Neurologic: Mental status: Alert, oriented, thought content appropriate Cranial nerves: normal (II-XII grossly intact)    Adult ECG Report n/a  Other studies Reviewed: Additional studies/ records that were reviewed today include:  Recent Labs:   Lab Results  Component Value Date   CHOL 216 (H) 11/24/2015   HDL 67.80 11/24/2015   LDLCALC 121 (H) 11/24/2015   LDLDIRECT 136.2 06/03/2011   TRIG  134.0 11/24/2015   CHOLHDL 3 11/24/2015    ASSESSMENT / PLAN: Problem List Items Addressed This Visit    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 I would not consider this to be diastolic heart failure. The could be associated with exertional dyspnea especially if her blood pressure increases like it did during stress test. At this point with no obvious ischemia noted on stress test, I would be more inclined to think that she may have some microvascular disease component.: Recommendation would be lipid management and blood pressure control. Consider true medical therapy if symptoms persist in follow-up.      DOE (dyspnea on exertion)    Mostly normal echocardiogram with some diastolic dysfunction and no ischemia or infarction noted on stress test. But she did have is initial hypertensive response to exercise followed by hypotension which could be consistent  with coronary disease - either macro or microvascular.  At this point I think risk factor modification is the best option. She would like to avoid invasive evaluation unless symptoms do not get better with her exercise regimen. She would like to wait for now reassess in several months to see how she's doing, if her progression is not getting any better and still having the exertional fatigue and dyspnea, we would consider invasive evaluation with cardiac catheterization versus CTA.  For cardiac risk factor modification, her blood pressure is relatively well-controlled, she is obese and is working on trying to lose weight, her lipids are borderline controlled and she would prefer not going on medications at this point. I recommended that she start off with coenzyme Q 10. Beginning and reassess her lipids after several months to see where they stand. Would likely try to target an LDL closer to 100.   I don't think we need to start an antihypertensive agent at this time, and therefore  I'm reluctant to consider cardiac catheterization unless he were to start something. --Would likely not use beta blocker however because of her fatigue and exertional dyspnea.         CHEST PAIN, ATYPICAL    Her chest discomfort is relatively atypical in that it is sharp, and not necessarily associated with exertion. No ischemia noted on stress test, therefore less inclined to think this is macrovascular related. However if her symptoms of exertional dyspnea and chest discomfort continue despite her efforts to lose weight and exercise, we may need to consider a more detailed anatomic evaluation with either coronary angiography from invasive or noninvasive approach.         Current medicines are reviewed at length with the patient today. (+/- concerns) none The following changes have been made: none  Patient Instructions  MEDICATION  ADDITION   CoQ10  START WITH 100 MG ONE CAPSULE FOR 2 WEEKS THEN INCREASE BY 100  MG EVERY 2 WEEKS UNTIL YOU REACH 300 MG DAILY.    NO OTHER CHANGES   SYMPTOMS CONTINUE WILL POSSIBLE SCHEDULE CARDIAC CATHETERIZATION  Your physician recommends that you schedule a follow-up appointment in April 2018 [place].     If you need a refill on your cardiac medications before your next appointment, please call your pharmacy.   Studies Ordered:   No orders of the defined types were placed in this encounter.     Glenetta Hew, M.D., M.S. Interventional Cardiologist   Pager # [phone] Phone # [phone] [address] [place], [place] [address]

## 2016-03-22 NOTE — Assessment & Plan Note (Signed)
Diastolic dysfunction noted on echocardiogram. I would not consider this to be diastolic heart failure. The could be associated with exertional dyspnea especially if her blood pressure increases like it did during stress test. At this point with no obvious ischemia noted on stress test, I would be more inclined to think that she may have some microvascular disease component.: Recommendation would be lipid management and blood pressure control. Consider true medical therapy if symptoms persist in follow-up.

## 2016-04-04 DIAGNOSIS — E669 Obesity, unspecified: Secondary | ICD-10-CM | POA: Diagnosis not present

## 2016-04-04 DIAGNOSIS — Z6834 Body mass index (BMI) 34.0-34.9, adult: Secondary | ICD-10-CM | POA: Diagnosis not present

## 2016-04-04 DIAGNOSIS — Z87891 Personal history of nicotine dependence: Secondary | ICD-10-CM | POA: Diagnosis not present

## 2016-04-04 DIAGNOSIS — Z0000 Encounter for general adult medical examination without abnormal findings: Secondary | ICD-10-CM | POA: Diagnosis not present

## 2016-04-04 DIAGNOSIS — Z7951 Long term (current) use of inhaled steroids: Secondary | ICD-10-CM | POA: Diagnosis not present

## 2016-04-04 DIAGNOSIS — Z79899 Other long term (current) drug therapy: Secondary | ICD-10-CM | POA: Diagnosis not present

## 2016-04-04 DIAGNOSIS — J4520 Mild intermittent asthma, uncomplicated: Secondary | ICD-10-CM | POA: Diagnosis not present

## 2016-04-04 DIAGNOSIS — J449 Chronic obstructive pulmonary disease, unspecified: Secondary | ICD-10-CM | POA: Diagnosis not present

## 2016-06-10 ENCOUNTER — Ambulatory Visit: Payer: Medicare HMO | Admitting: Cardiology

## 2016-06-17 ENCOUNTER — Encounter: Payer: Self-pay | Admitting: Cardiology

## 2016-06-17 ENCOUNTER — Ambulatory Visit (INDEPENDENT_AMBULATORY_CARE_PROVIDER_SITE_OTHER): Payer: Medicare HMO | Admitting: Cardiology

## 2016-06-17 VITALS — BP 130/70 | HR 69 | Ht 67.0 in | Wt 219.0 lb

## 2016-06-17 DIAGNOSIS — R0609 Other forms of dyspnea: Secondary | ICD-10-CM

## 2016-06-17 DIAGNOSIS — E785 Hyperlipidemia, unspecified: Secondary | ICD-10-CM | POA: Diagnosis not present

## 2016-06-17 DIAGNOSIS — I519 Heart disease, unspecified: Secondary | ICD-10-CM | POA: Diagnosis not present

## 2016-06-17 DIAGNOSIS — R0600 Dyspnea, unspecified: Secondary | ICD-10-CM

## 2016-06-17 DIAGNOSIS — I5189 Other ill-defined heart diseases: Secondary | ICD-10-CM

## 2016-06-17 NOTE — Patient Instructions (Addendum)
Your physician recommends that you continue on your current medications as directed. Please refer to the Current Medication list given to you today.   Your physician wants you to follow-up in: 6 months with Dr. Melody Haver will receive a reminder letter in the mail two months in advance. If you don't receive a letter, please call our office to schedule the follow-up appointment.
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
## 2016-06-19 ENCOUNTER — Encounter: Payer: Self-pay | Admitting: Cardiology

## 2016-06-19 NOTE — Assessment & Plan Note (Signed)
Would continue to monitor with diet and exercise. If labs are not rechecked prior to our 6 month follow-up, we can check at that time. May need to consider statin or other therapy

## 2016-06-19 NOTE — Assessment & Plan Note (Signed)
No heart failure symptoms besides some exertional dyspnea. I think this could be related to diastolic dysfunction. She is not currently on antihypertensive regimen. If her blood pressures were to be elevated in follow-up visits, we could consider adding afterload reduction with an ARB or carvedilol.

## 2016-06-19 NOTE — Assessment & Plan Note (Signed)
Probably noncardiac in nature unless there is some diastolic dysfunction involvement. I think is mostly deconditioning. She's had a negative Myoview excluding macrovascular ischemia and a relatively normal echo.  Continue recommend weight loss with diet and exercise. She did do better and has felt better now having started CoQ10 which I plan to titrate up to 300 mg a day.  Continue diet and exercise. Reassess in 6 months. His symptoms are still persisting, we may need to then consider either coronary CTA or invasive evaluation catheterization. I hope that she will improve.

## 2016-08-01 DIAGNOSIS — Z9841 Cataract extraction status, right eye: Secondary | ICD-10-CM | POA: Diagnosis not present

## 2016-08-01 DIAGNOSIS — Z9842 Cataract extraction status, left eye: Secondary | ICD-10-CM | POA: Diagnosis not present

## 2016-08-11 DIAGNOSIS — H903 Sensorineural hearing loss, bilateral: Secondary | ICD-10-CM | POA: Diagnosis not present

## 2016-08-18 ENCOUNTER — Telehealth: Payer: Self-pay

## 2016-08-18 NOTE — Telephone Encounter (Signed)
Patient is on the list for [employer] 2018 and may be a good candidate for an AWV. Please let me know if/when appt is scheduled.   Due around September

## 2016-08-19 NOTE — Telephone Encounter (Signed)
Scheduled 11/24/16

## 2016-11-24 ENCOUNTER — Ambulatory Visit (INDEPENDENT_AMBULATORY_CARE_PROVIDER_SITE_OTHER): Payer: Medicare HMO

## 2016-11-24 ENCOUNTER — Other Ambulatory Visit: Payer: Self-pay

## 2016-11-24 ENCOUNTER — Telehealth: Payer: Self-pay | Admitting: Family Medicine

## 2016-11-24 VITALS — BP 122/60 | HR 75 | Temp 98.4°F | Ht 65.75 in | Wt 222.5 lb

## 2016-11-24 DIAGNOSIS — Z23 Encounter for immunization: Secondary | ICD-10-CM | POA: Diagnosis not present

## 2016-11-24 DIAGNOSIS — E559 Vitamin D deficiency, unspecified: Secondary | ICD-10-CM | POA: Diagnosis not present

## 2016-11-24 DIAGNOSIS — Z0000 Encounter for general adult medical examination without abnormal findings: Secondary | ICD-10-CM

## 2016-11-24 DIAGNOSIS — E785 Hyperlipidemia, unspecified: Secondary | ICD-10-CM

## 2016-11-24 DIAGNOSIS — M85859 Other specified disorders of bone density and structure, unspecified thigh: Secondary | ICD-10-CM

## 2016-11-24 LAB — LIPID PANEL
CHOLESTEROL: 213 mg/dL — AB (ref 0–200)
HDL: 63.4 mg/dL (ref >39.00)
LDL Cholesterol: 125 mg/dL — ABNORMAL HIGH (ref 0–99)
NonHDL: 149.51
Total CHOL/HDL Ratio: 3
Triglycerides: 123 mg/dL (ref 0.0–149.0)
VLDL: 24.6 mg/dL (ref 0.0–40.0)

## 2016-11-24 LAB — HEMOGLOBIN A1C: HEMOGLOBIN A1C: 5.7 % (ref 4.6–6.5)

## 2016-11-24 LAB — COMPREHENSIVE METABOLIC PANEL
ALK PHOS: 59 U/L (ref 39–117)
ALT: 15 U/L (ref 0–35)
AST: 16 U/L (ref 0–37)
Albumin: 3.9 g/dL (ref 3.5–5.2)
BILIRUBIN TOTAL: 0.4 mg/dL (ref 0.2–1.2)
BUN: 15 mg/dL (ref 6–23)
CO2: 33 meq/L — AB (ref 19–32)
CREATININE: 0.71 mg/dL (ref 0.40–1.20)
Calcium: 9.5 mg/dL (ref 8.4–10.5)
Chloride: 104 mEq/L (ref 96–112)
GFR: 86.17 mL/min (ref >60.00)
GLUCOSE: 100 mg/dL — AB (ref 70–99)
Potassium: 4.8 mEq/L (ref 3.5–5.1)
SODIUM: 141 meq/L (ref 135–145)
TOTAL PROTEIN: 6.7 g/dL (ref 6.0–8.3)

## 2016-11-24 LAB — VITAMIN D 25 HYDROXY (VIT D DEFICIENCY, FRACTURES): VITD: 27.14 ng/mL — ABNORMAL LOW (ref 30.00–100.00)

## 2016-11-24 MED ORDER — ALBUTEROL SULFATE HFA 108 (90 BASE) MCG/ACT IN AERS: 2.0000 | INHALATION_SPRAY | Freq: Four times a day (QID) | RESPIRATORY_TRACT | 3 refills | Status: DC | PRN

## 2016-11-24 MED ORDER — FLUTICASONE-SALMETEROL 250-50 MCG/DOSE IN AEPB: 1.0000 | INHALATION_SPRAY | Freq: Two times a day (BID) | RESPIRATORY_TRACT | 11 refills | Status: DC

## 2016-11-24 NOTE — Telephone Encounter (Signed)
Ms. Hannah Green notified as instructed by telephone.  She wants to think about her options. Advised she can wait and discuss this  further at her upcoming office visit with Dr. Diona Browner or if she wants something done sooner, she can call me back, once she has had time to think about it.

## 2016-11-24 NOTE — Telephone Encounter (Signed)
No higher dose  for topical facial metronidazole gel... But we can change to 0.75% and she can use that BID or try oral antibiotic or referral to derm.

## 2016-11-24 NOTE — Progress Notes (Signed)
Subjective:   Hannah Green is a 71 y.o. female who presents for Medicare Annual (Subsequent) preventive examination.  Review of Systems:  N/A Cardiac Risk Factors include: advanced age (>74men, >8 women);obesity (BMI >30kg/m2);dyslipidemia     Objective:     Vitals: BP 122/60 (BP Location: Left Arm, Patient Position: Sitting, Cuff Size: Large)   Pulse 75   Temp 98.4 F (36.9 C) (Oral)   Ht 5' 5.75" (1.67 m) Comment: no shoes  Wt 222 lb 8 oz (100.9 kg)   LMP  (LMP Unknown)   SpO2 95%   BMI 36.19 kg/m   Body mass index is 36.19 kg/m.   Tobacco History  Smoking Status  . Former Smoker  . Quit date: 03/31/2010  Smokeless Tobacco  . Never Used    Comment: has stopped but has started again in "crisis"     Counseling given: No   Past Medical History:  Diagnosis Date  . GERD (gastroesophageal reflux disease)   . Hyperlipidemia   . Rheumatoid arthritis(714.0)    Past Surgical History:  Procedure Laterality Date  . BREAST LUMPECTOMY  1999   left, needed radiation  . CATARACT EXTRACTION W/ INTRAOCULAR LENS  IMPLANT, BILATERAL Bilateral 05/19/15, 06/02/15  . CHOLECYSTECTOMY    . NM MYOVIEW LTD  01/2016   LOW RISK. Hypertensive response to exercise followed by significant drop (stage I pressure 211/70 drop to 118/75. Stage II). Apical artifact but otherwise no ischemia or infarction. EF 64%.  . TONSILLECTOMY    . TRANSTHORACIC ECHOCARDIOGRAM  01/12/2016   Mild LVH. EF 60-65%. GR 2-DD   Family History  Problem Relation Age of Onset  . Cancer Mother        breast  . Cancer Father        lung cancer  . Cancer Sister        throat cancer  . Diabetes Paternal Uncle   . Diabetes Maternal Grandmother    History  Sexual Activity  . Sexual activity: No    Outpatient Encounter Prescriptions as of 11/24/2016  Medication Sig  . albuterol (PROVENTIL HFA) 108 (90 BASE) MCG/ACT inhaler Inhale 2 puffs into the lungs every 6 (six) hours as needed for wheezing or shortness  of breath.  . Coenzyme Q10 (CO Q-10) 100 MG CAPS Take 300 mg by mouth daily.  . Fluticasone-Salmeterol (ADVAIR) 250-50 MCG/DOSE AEPB Inhale 1 puff into the lungs every 12 (twelve) hours.  . metroNIDAZOLE (METROGEL) 1 % gel Apply topically daily.   No facility-administered encounter medications on file as of 11/24/2016.     Activities of Daily Living In your present state of health, do you have any difficulty performing the following activities: 11/24/2016  Hearing? Y  Vision? N  Difficulty concentrating or making decisions? N  Walking or climbing stairs? Y  Dressing or bathing? N  Doing errands, shopping? N  Preparing Food and eating ? N  Using the Toilet? N  In the past six months, have you accidently leaked urine? N  Do you have problems with loss of bowel control? N  Managing your Medications? N  Managing your Finances? N  Housekeeping or managing your Housekeeping? N  Some recent data might be hidden    Patient Care Team: Jinny Sanders, MD as PCP - General Thelma Comp, OD as Consulting Physician (Optometry) Vevelyn Royals, MD as Consulting Physician (Ophthalmology) Carloyn Manner, MD as Referring Physician (Otolaryngology)    Assessment:    Hearing Screening Comments: Formal hearing exam in July  2018 @ [facility]/Dr. Pryor Ochoa. Patient has hearing loss that would benefit from hearing aids. Patient is unable to afford them.  Vision Screening Comments: Last vision exam in March 2018 with Dr. Maryruth Hancock B.  Exercise Activities and Dietary recommendations Current Exercise Habits: The patient does not participate in regular exercise at present, Exercise limited by: None identified  Goals    . Increase physical activity          In Winter 2018, I will resume exercising for at least 60 min twice weekly.       Fall Risk Fall Risk  11/24/2016 11/24/2015  Falls in the past year? No No   Depression Screen PHQ 2/9 Scores 11/24/2016 11/24/2015  PHQ - 2 Score 1 0  PHQ- 9  Score 6 -     Cognitive Function MMSE - Mini Mental State Exam 11/24/2016 11/24/2015  Orientation to time 5 5  Orientation to Place 5 5  Registration 3 3  Attention/ Calculation 0 0  Recall 3 3  Language- name 2 objects 0 0  Language- repeat 1 1  Language- follow 3 step command 3 3  Language- read & follow direction 0 0  Write a sentence 0 0  Copy design 0 0  Total score 20 20     PLEASE NOTE: A Mini-Cog screen was completed. Maximum score is 20. A value of 0 denotes this part of Folstein MMSE was not completed or the patient failed this part of the Mini-Cog screening.   Mini-Cog Screening Orientation to Time - Max 5 pts Orientation to Place - Max 5 pts Registration - Max 3 pts Recall - Max 3 pts Language Repeat - Max 1 pts Language Follow 3 Step Command - Max 3 pts     Immunization History  Administered Date(s) Administered  . Influenza,inj,Quad PF,6+ Mos 12/22/2015, 11/24/2016  . Pneumococcal Conjugate-13 12/22/2015  . Pneumococcal Polysaccharide-23 06/02/2011  . Td 06/02/2011   Screening Tests Health Maintenance  Topic Date Due  . MAMMOGRAM  02/08/2017  . COLONOSCOPY  11/21/2020  . TETANUS/TDAP  06/01/2021  . INFLUENZA VACCINE  Completed  . DEXA SCAN  Completed  . Hepatitis C Screening  Completed  . PNA vac Low Risk Adult  Completed      Plan:     I have personally reviewed and addressed the Medicare Annual Wellness questionnaire and have noted the following in the patient's chart:  A. Medical and social history B. Use of alcohol, tobacco or illicit drugs  C. Current medications and supplements D. Functional ability and status E.  Nutritional status F.  Physical activity G. Advance directives H. List of other physicians I.  Hospitalizations, surgeries, and ER visits in previous 12 months J.  [place] to include hearing, vision, cognitive, depression L. Referrals and appointments - none  In addition, I have reviewed and discussed with  patient certain preventive protocols, quality metrics, and best practice recommendations. A written personalized care plan for preventive services as well as general preventive health recommendations were provided to patient.  See attached scanned questionnaire for additional information.   Signed,   Lindell Noe, MHA, BS, LPN Health Coach

## 2016-11-24 NOTE — Progress Notes (Signed)
Pre visit review using our clinic review tool, if applicable. No additional management support is needed unless otherwise documented below in the visit note. 

## 2016-11-24 NOTE — Progress Notes (Signed)
PCP notes:   Health maintenance:  Flu vaccine - administered  Abnormal screenings:   Depression score: 6  Patient concerns:   Medication refills - refill request sent to PCP  Moles - provided pt with contact info to [facility]. Patient stated that the dermatologists she has previously contacted are not accepting new patients.  Hearing concerns - per Dr. Pryor Ochoa, patient needs hearing aids but she cannot afford them  Nurse concerns:  None  Next PCP appt:   12/06/16 @ 1100

## 2016-11-24 NOTE — Progress Notes (Signed)
I reviewed health advisor's note, was available for consultation, and agree with documentation and plan.   Signed,  Atreus Hasz T. Christan Ciccarelli, MD  

## 2016-11-24 NOTE — Patient Instructions (Signed)
Hannah Green , Thank you for taking time to come for your Medicare Wellness Visit. I appreciate your ongoing commitment to your health goals. Please review the following plan we discussed and let me know if I can assist you in the future.   These are the goals we discussed: Goals    . Increase physical activity          In Winter 2018, I will resume exercising for at least 60 min twice weekly.        This is a list of the screening recommended for you and due dates:  Health Maintenance  Topic Date Due  . Mammogram  02/08/2017  . Colon Cancer Screening  11/21/2020  . Tetanus Vaccine  06/01/2021  . Flu Shot  Completed  . DEXA scan (bone density measurement)  Completed  .  Hepatitis C: One time screening is recommended by Center for Disease Control  (CDC) for  adults born from [birth year] through 1965.   Completed  . Pneumonia vaccines  Completed   Preventive Care for Adults  A healthy lifestyle and preventive care can promote health and wellness. Preventive health guidelines for adults include the following key practices.  . A routine yearly physical is a good way to check with your health care provider about your health and preventive screening. It is a chance to share any concerns and updates on your health and to receive a thorough exam.  . Visit your dentist for a routine exam and preventive care every 6 months. Brush your teeth twice a day and floss once a day. Good oral hygiene prevents tooth decay and gum disease.  . The frequency of eye exams is based on your age, health, family medical history, use  of contact lenses, and other factors. Follow your health care provider's ecommendations for frequency of eye exams.  . Eat a healthy diet. Foods like vegetables, fruits, whole grains, low-fat dairy products, and lean protein foods contain the nutrients you need without too many calories. Decrease your intake of foods high in solid fats, added sugars, and salt. Eat the right amount of  calories for you. Get information about a proper diet from your health care provider, if necessary.  . Regular physical exercise is one of the most important things you can do for your health. Most adults should get at least 150 minutes of moderate-intensity exercise (any activity that increases your heart rate and causes you to sweat) each week. In addition, most adults need muscle-strengthening exercises on 2 or more days a week.  Silver Sneakers may be a benefit available to you. To determine eligibility, you may visit the website: www.silversneakers.com or contact program at 250-357-6026 Mon-Fri between 8AM-8PM.   . Maintain a healthy weight. The body mass index (BMI) is a screening tool to identify possible weight problems. It provides an estimate of body fat based on height and weight. Your health care provider can find your BMI and can help you achieve or maintain a healthy weight.   For adults 20 years and older: ? A BMI below 18.5 is considered underweight. ? A BMI of 18.5 to 24.9 is normal. ? A BMI of 25 to 29.9 is considered overweight. ? A BMI of 30 and above is considered obese.   . Maintain normal blood lipids and cholesterol levels by exercising and minimizing your intake of saturated fat. Eat a balanced diet with plenty of fruit and vegetables. Blood tests for lipids and cholesterol should begin at age 8 and  be repeated every 5 years. If your lipid or cholesterol levels are high, you are over 50, or you are at high risk for heart disease, you may need your cholesterol levels checked more frequently. Ongoing high lipid and cholesterol levels should be treated with medicines if diet and exercise are not working.  . If you smoke, find out from your health care provider how to quit. If you do not use tobacco, please do not start.  . If you choose to drink alcohol, please do not consume more than 2 drinks per day. One drink is considered to be 12 ounces (355 mL) of beer, 5 ounces  (148 mL) of wine, or 1.5 ounces (44 mL) of liquor.  . If you are 66-54 years old, ask your health care provider if you should take aspirin to prevent strokes.  . Use sunscreen. Apply sunscreen liberally and repeatedly throughout the day. You should seek shade when your shadow is shorter than you. Protect yourself by wearing long sleeves, pants, a wide-brimmed hat, and sunglasses year round, whenever you are outdoors.  . Once a month, do a whole body skin exam, using a mirror to look at the skin on your back. Tell your health care provider of new moles, moles that have irregular borders, moles that are larger than a pencil eraser, or moles that have changed in shape or color.

## 2016-11-24 NOTE — Telephone Encounter (Signed)
-----   Message from Eustace Pen, LPN sent at 0/04/7046  9:47 PM EDT ----- Regarding: Lab 9/27 Lab orders needed.  [employer]

## 2016-11-24 NOTE — Telephone Encounter (Signed)
Patient was in office today and requested refills for Proventil and Advair inhalers. Patient requested a stronger strength of the metronidazole gel. Patient states current prescription is not effective.   [facility] [place], [place].

## 2016-12-06 ENCOUNTER — Encounter: Payer: Self-pay | Admitting: Family Medicine

## 2016-12-06 ENCOUNTER — Ambulatory Visit (INDEPENDENT_AMBULATORY_CARE_PROVIDER_SITE_OTHER): Payer: Medicare HMO | Admitting: Family Medicine

## 2016-12-06 VITALS — BP 110/58 | HR 74 | Temp 98.5°F | Ht 65.75 in | Wt 221.0 lb

## 2016-12-06 DIAGNOSIS — E785 Hyperlipidemia, unspecified: Secondary | ICD-10-CM | POA: Diagnosis not present

## 2016-12-06 DIAGNOSIS — Z0000 Encounter for general adult medical examination without abnormal findings: Secondary | ICD-10-CM

## 2016-12-06 DIAGNOSIS — E559 Vitamin D deficiency, unspecified: Secondary | ICD-10-CM | POA: Diagnosis not present

## 2016-12-06 DIAGNOSIS — J449 Chronic obstructive pulmonary disease, unspecified: Secondary | ICD-10-CM | POA: Diagnosis not present

## 2016-12-06 DIAGNOSIS — Z87891 Personal history of nicotine dependence: Secondary | ICD-10-CM | POA: Diagnosis not present

## 2016-12-06 NOTE — Assessment & Plan Note (Signed)
Replete with OTC med.

## 2016-12-06 NOTE — Patient Instructions (Addendum)
VIT D3 400 IU twice daily.  Work on low cholesterol diet, start exercise, weight loss.  Return in 3 month for lab only visit to check cholesterol alone.  Call to schedule mammogram.  Call Dr. Collene Mares to set up colonoscopy.  Stop at front desk to set up lung cancer screening referral.

## 2016-12-06 NOTE — Assessment & Plan Note (Signed)
Statin indicated with AHA risk 14%  She will  work on [employer].  If not at goal in 3 months will consider re-eval.

## 2016-12-06 NOTE — Progress Notes (Signed)
Subjective:    Patient ID: Hannah Green, female    DOB: 04-Dec-1945, 71 y.o.   MRN: [id number]  HPI  The patient presents for  complete physical and review of chronic health problems.   The patient saw Candis Musa, LPN for medicare wellness. Note reviewed in detail and important notes copied below.  Health maintenance: Flu vaccine - administered Abnormal screenings:  Depression score: 6 Patient concerns:  Medication refills - refill request sent to PCP Moles - provided pt with contact info to [facility]. Patient stated that the dermatologists she has previously contacted are not accepting new patients. Hearing concerns - per Dr. Pryor Ochoa, patient needs hearing aids but she cannot afford them  12/06/16 Today   Vit D deficiency: no longer taking   Improved but remains.  Elevated Cholesterol:  LDL at goal on no med. 10 year AHA risk: 14% risk. Lab Results  Component Value Date   CHOL 213 (H) 11/24/2016   HDL 63.40 11/24/2016   LDLCALC 125 (H) 11/24/2016   LDLDIRECT 136.2 06/03/2011   TRIG 123.0 11/24/2016   CHOLHDL 3 11/24/2016  Using medications without problems: Muscle aches:  Diet compliance: healthy low fat diet, has sweet tooth. Exercise: none Other complaints:   prediabetes:  Lab Results  Component Value Date   HGBA1C 5.7 11/24/2016    COPD: now nonsmoker.: Stable on advair and albuterol prn. She has noted improvement on these but cannot afford.  Stable daily cough, mild DOE.    Social History /Family History/Past Medical History reviewed in detail and updated in EMR if needed. Blood pressure (!) 110/58, pulse 74, temperature 98.5 F (36.9 C), temperature source Oral, height 5' 5.75" (1.67 m), weight 221 lb (100.2 kg), SpO2 96 %. Body mass index is 35.94 kg/m.   Review of Systems  Constitutional: Negative for fatigue and fever.  HENT: Negative for ear pain.   Eyes: Negative for pain.  Respiratory: Negative for chest tightness and  shortness of breath.   Cardiovascular: Negative for chest pain, palpitations and leg swelling.  Gastrointestinal: Negative for abdominal pain.  Genitourinary: Negative for dysuria.       Objective:   Physical Exam  Constitutional: Vital signs are normal. She appears well-developed and well-nourished. She is cooperative.  Non-toxic appearance. She does not appear ill. No distress.  overweight  HENT:  Head: Normocephalic.  Right Ear: Hearing, tympanic membrane, external ear and ear canal normal.  Left Ear: Hearing, tympanic membrane, external ear and ear canal normal.  Nose: Nose normal.  Eyes: Pupils are equal, round, and reactive to light. Conjunctivae, EOM and lids are normal. Lids are everted and swept, no foreign bodies found.  Neck: Trachea normal and normal range of motion. Neck supple. Carotid bruit is not present. No thyroid mass and no thyromegaly present.  Cardiovascular: Normal rate, regular rhythm, S1 normal, S2 normal, normal heart sounds and intact distal pulses.  Exam reveals no gallop.   No murmur heard. Pulmonary/Chest: Effort normal and breath sounds normal. No respiratory distress. She has no wheezes. She has no rhonchi. She has no rales.  Abdominal: Soft. Normal appearance and bowel sounds are normal. She exhibits no distension, no fluid wave, no abdominal bruit and no mass. There is no hepatosplenomegaly. There is no tenderness. There is no rebound, no guarding and no CVA tenderness. No hernia.  Lymphadenopathy:    She has no cervical adenopathy.    She has no axillary adenopathy.  Neurological: She is alert. She has normal strength. No  cranial nerve deficit or sensory deficit.  Skin: Skin is warm, dry and intact. No rash noted.  Psychiatric: Her speech is normal and behavior is normal. Judgment normal. Her mood appears not anxious. Cognition and memory are normal. She does not exhibit a depressed mood.          Assessment & Plan:  The patient's preventative  maintenance and recommended screening tests for an annual wellness exam were reviewed in full today. Brought up to date unless services declined.  Counselled on the importance of diet, exercise, and its role in overall health and mortality. The patient's FH and SH was reviewed, including their home life, tobacco status, and drug and alcohol status.   Vaccines:  PCV 13, 23 and flu uptodate. Due for shingles.. Refused not covered by insurance. Mammo:  nml 2016, due now Hx of breast CA. Last pap age 31, no further indicated,  No DVE , low risk, no family history of ovarian or uterine cancer. DEXA: 12/2015 Colon: Last 02/2010 nml, Dr. Collene Mares. Repeat in 5 years, due now.. Hep C: neg Former smoker, >40 years: candidate for lung cancer screening.  spirometry 2017 COPD

## 2016-12-07 DIAGNOSIS — J449 Chronic obstructive pulmonary disease, unspecified: Secondary | ICD-10-CM | POA: Insufficient documentation

## 2016-12-07 NOTE — Assessment & Plan Note (Signed)
Stable control on advair and albuterol prn. Cost of advair is high.. She will look into cheaper options and will contact us to switch if needed.

## 2017-01-02 DIAGNOSIS — Z853 Personal history of malignant neoplasm of breast: Secondary | ICD-10-CM | POA: Diagnosis not present

## 2017-01-02 DIAGNOSIS — Z1231 Encounter for screening mammogram for malignant neoplasm of breast: Secondary | ICD-10-CM | POA: Diagnosis not present

## 2017-01-16 ENCOUNTER — Other Ambulatory Visit: Payer: Self-pay | Admitting: Acute Care

## 2017-01-16 DIAGNOSIS — Z122 Encounter for screening for malignant neoplasm of respiratory organs: Secondary | ICD-10-CM

## 2017-01-16 DIAGNOSIS — Z87891 Personal history of nicotine dependence: Secondary | ICD-10-CM

## 2017-02-01 ENCOUNTER — Ambulatory Visit (INDEPENDENT_AMBULATORY_CARE_PROVIDER_SITE_OTHER): Payer: Medicare HMO | Admitting: Acute Care

## 2017-02-01 ENCOUNTER — Ambulatory Visit (INDEPENDENT_AMBULATORY_CARE_PROVIDER_SITE_OTHER)
Admission: RE | Admit: 2017-02-01 | Discharge: 2017-02-01 | Disposition: A | Discharge status: Home / Self Care | Payer: Medicare HMO | Source: Ambulatory Visit | Attending: Acute Care | Admitting: Acute Care

## 2017-02-01 ENCOUNTER — Encounter: Payer: Self-pay | Admitting: Acute Care

## 2017-02-01 DIAGNOSIS — Z122 Encounter for screening for malignant neoplasm of respiratory organs: Secondary | ICD-10-CM

## 2017-02-01 DIAGNOSIS — Z87891 Personal history of nicotine dependence: Secondary | ICD-10-CM | POA: Diagnosis not present

## 2017-02-01 NOTE — Progress Notes (Signed)
Shared Decision Making Visit Lung Cancer Screening Program [phone])   Eligibility:  Age 71 y.o.  Pack Years Smoking History Calculation 75 pack year smoking history (# packs/per year x # years smoked)  Recent History of coughing up blood  no  Unexplained weight loss? no ( >Than 15 pounds within the last 6 months )  Prior History Lung / other cancer no (Diagnosis within the last 5 years already requiring surveillance chest CT Scans).  Smoking Status Former Smoker  Former Smokers: Years since quit: 5 years  Quit Date: 04/2011  Visit Components:  Discussion included one or more decision making aids. yes  Discussion included risk/benefits of screening. yes  Discussion included potential follow up diagnostic testing for abnormal scans. yes  Discussion included meaning and risk of over diagnosis. yes  Discussion included meaning and risk of False Positives. yes  Discussion included meaning of total radiation exposure. yes  Counseling Included:  Importance of adherence to annual lung cancer LDCT screening. yes  Impact of comorbidities on ability to participate in the program. yes  Ability and willingness to under diagnostic treatment. yes  Smoking Cessation Counseling:  Current Smokers:   Discussed importance of smoking cessation. NA, former smoker  Information about tobacco cessation classes and interventions provided to patient. NA, former smoker  Patient provided with "ticket" for LDCT Scan. yes  Symptomatic Patient. no  Counseling: NA  Diagnosis Code: Tobacco Use Z72.0  Asymptomatic Patient yes  Counseling (Intermediate counseling: > three minutes counseling) D6222  Former Smokers:   Discussed the importance of maintaining cigarette abstinence. yes  Diagnosis Code: Personal History of Nicotine Dependence. L79.892  Information about tobacco cessation classes and interventions provided to patient. Yes  Patient provided with "ticket" for LDCT Scan.  yes  Written Order for Lung Cancer Screening with LDCT placed in Epic. Yes (CT Chest Lung Cancer Screening Low Dose W/O CM) [id number] Z12.2-Screening of respiratory organs Z87.891-Personal history of nicotine dependence  I spent 25 minutes of face to face time with Hannah Green  discussing the risks and benefits of lung cancer screening. We viewed a power point together that explained in detail the above noted topics. We took the time to pause the power point at intervals to allow for questions to be asked and answered to ensure understanding. We discussed that she had taken the single most powerful action possible to decrease her risk of developing lung cancer when she quit smoking. I counseled her to remain smoke free, and to contact me if she ever had the desire to smoke again so that I can provide resources and tools to help support the effort to remain smoke free. We discussed the time and location of the scan, and that either  Doroteo Glassman RN or I will call with the results within  24-48 hours of receiving them. She has my card and contact information in the event she needs to speak with me, in addition to a copy of the power point we reviewed as a resource. She verbalized understanding of all of the above and had no further questions upon leaving the office.     I explained to the patient that there has been a high incidence of coronary artery disease noted on these exams. I explained that this is a non-gated exam therefore degree or severity cannot be determined. This patient is not currently on statin therapy. I have asked the patient to follow-up with their PCP regarding any incidental finding of coronary artery disease and management with diet  or medication as they feel is clinically indicated. The patient verbalized understanding of the above and had no further questions.     Magdalen Spatz, NP 02/01/2017

## 2017-02-02 ENCOUNTER — Other Ambulatory Visit: Payer: Self-pay | Admitting: Acute Care

## 2017-02-02 DIAGNOSIS — Z122 Encounter for screening for malignant neoplasm of respiratory organs: Secondary | ICD-10-CM

## 2017-02-02 DIAGNOSIS — Z87891 Personal history of nicotine dependence: Secondary | ICD-10-CM

## 2017-03-09 ENCOUNTER — Telehealth: Payer: Self-pay | Admitting: Family Medicine

## 2017-03-09 ENCOUNTER — Other Ambulatory Visit: Payer: Medicare HMO

## 2017-03-09 DIAGNOSIS — E7800 Pure hypercholesterolemia, unspecified: Secondary | ICD-10-CM

## 2017-03-09 NOTE — Telephone Encounter (Signed)
-----   Message from Ellamae Sia sent at 03/01/2017 10:50 AM EST ----- Regarding: Lab orders for Thursday, 1.10.19 Lab orders for a 3 month follow up appt.

## 2017-03-15 ENCOUNTER — Other Ambulatory Visit (INDEPENDENT_AMBULATORY_CARE_PROVIDER_SITE_OTHER): Payer: Medicare HMO

## 2017-03-15 DIAGNOSIS — E7800 Pure hypercholesterolemia, unspecified: Secondary | ICD-10-CM

## 2017-03-15 LAB — LIPID PANEL
CHOL/HDL RATIO: 3
CHOLESTEROL: 211 mg/dL — AB (ref 0–200)
HDL: 64.4 mg/dL (ref >39.00)
LDL CALC: 118 mg/dL — AB (ref 0–99)
NONHDL: 146.77
Triglycerides: 145 mg/dL (ref 0.0–149.0)
VLDL: 29 mg/dL (ref 0.0–40.0)

## 2017-03-17 ENCOUNTER — Telehealth: Payer: Self-pay | Admitting: Family Medicine

## 2017-03-17 MED ORDER — ATORVASTATIN CALCIUM 10 MG PO TABS: 10.0000 mg | ORAL_TABLET | Freq: Every day | ORAL | 3 refills | Status: DC

## 2017-03-17 NOTE — Telephone Encounter (Signed)
-----   Message from Carter Kitten, [place] sent at 03/16/2017  3:49 PM EST ----- Ms. Walpole notified as instructed by telephone.  She is agreeable to starting a low dose statin.  [facility] [place]

## 2017-04-27 ENCOUNTER — Encounter: Payer: Self-pay | Admitting: Family Medicine

## 2017-04-27 ENCOUNTER — Other Ambulatory Visit: Payer: Self-pay

## 2017-04-27 ENCOUNTER — Ambulatory Visit (INDEPENDENT_AMBULATORY_CARE_PROVIDER_SITE_OTHER): Payer: Medicare HMO | Admitting: Family Medicine

## 2017-04-27 DIAGNOSIS — J441 Chronic obstructive pulmonary disease with (acute) exacerbation: Secondary | ICD-10-CM | POA: Insufficient documentation

## 2017-04-27 MED ORDER — AZITHROMYCIN 250 MG PO TABS: ORAL_TABLET | ORAL | 0 refills | Status: DC

## 2017-04-27 NOTE — Patient Instructions (Signed)
Mucinex DM twice daily.  Rest, fluids.  Start back Advair twice daily.  Use albuterol as needed for wheeze or chest tightness every 4-6 hours.  Complete a course of azithromycin.  Go to ER for severe shortness of breath and call us if not improving with breathing in next 24-48 hours.

## 2017-04-27 NOTE — Assessment & Plan Note (Signed)
Mild.. No indication for prednisone but given sputum change will treat with antibiotics.

## 2017-04-27 NOTE — Progress Notes (Signed)
   Subjective:    Patient ID: Hannah Green, female    DOB: Mar 12, 1945, 72 y.o.   MRN: [id number]  Cough  This is a new problem. The current episode started in the past 7 days (2-3 days). The cough is productive of sputum. Associated symptoms include chills, ear congestion, ear pain, nasal congestion, postnasal drip, a sore throat, shortness of breath and wheezing. Pertinent negatives include no fever or myalgias. Associated symptoms comments:  Ear pain right > left  chest tightness. The symptoms are aggravated by lying down. Risk factors for lung disease include smoking/tobacco exposure ( nonsmoker). Treatments tried:  coricidin, tylenol, has not had to increase albuterol use,, occ using advair. The treatment provided mild relief. Her past medical history is significant for COPD. There is no history of asthma or environmental allergies.     Blood pressure 120/60, pulse 81, temperature 98.6 F (37 C), temperature source Oral, height 5' 5.75" (1.67 m), weight 225 lb 12 oz (102.4 kg).  Review of Systems  Constitutional: Positive for chills. Negative for fever.  HENT: Positive for ear pain, postnasal drip and sore throat.   Respiratory: Positive for cough, shortness of breath and wheezing.   Musculoskeletal: Negative for myalgias.  Allergic/Immunologic: Negative for environmental allergies.       Objective:   Physical Exam  Constitutional: Vital signs are normal. She appears well-developed and well-nourished. She is cooperative.  Non-toxic appearance. She does not appear ill. No distress.  HENT:  Head: Normocephalic.  Right Ear: Hearing, external ear and ear canal normal. Tympanic membrane is not erythematous, not retracted and not bulging. A middle ear effusion is present.  Left Ear: Hearing, external ear and ear canal normal. Tympanic membrane is not erythematous, not retracted and not bulging. A middle ear effusion is present.  Nose: Mucosal edema and rhinorrhea present. Right sinus  exhibits maxillary sinus tenderness. Right sinus exhibits no frontal sinus tenderness. Left sinus exhibits maxillary sinus tenderness. Left sinus exhibits no frontal sinus tenderness.  Mouth/Throat: Uvula is midline and mucous membranes are normal. Posterior oropharyngeal erythema present. No oropharyngeal exudate or posterior oropharyngeal edema.  Eyes: Conjunctivae, EOM and lids are normal. Pupils are equal, round, and reactive to light. Lids are everted and swept, no foreign bodies found.  Neck: Trachea normal and normal range of motion. Neck supple. Carotid bruit is not present. No thyroid mass and no thyromegaly present.  Cardiovascular: Normal rate, regular rhythm, S1 normal, S2 normal, normal heart sounds, intact distal pulses and normal pulses. Exam reveals no gallop and no friction rub.  No murmur heard. Pulmonary/Chest: Effort normal. No tachypnea. No respiratory distress. She has no decreased breath sounds. She has no wheezes. She has no rhonchi. She has no rales.  Neurological: She is alert.  Skin: Skin is warm, dry and intact. No rash noted.  Psychiatric: Her speech is normal and behavior is normal. Judgment normal. Her mood appears not anxious. Cognition and memory are normal. She does not exhibit a depressed mood.          Assessment & Plan:

## 2017-07-27 DIAGNOSIS — H6981 Other specified disorders of Eustachian tube, right ear: Secondary | ICD-10-CM | POA: Diagnosis not present

## 2017-07-27 DIAGNOSIS — J029 Acute pharyngitis, unspecified: Secondary | ICD-10-CM | POA: Diagnosis not present

## 2017-08-09 DIAGNOSIS — Z9841 Cataract extraction status, right eye: Secondary | ICD-10-CM | POA: Diagnosis not present

## 2017-08-09 DIAGNOSIS — H1852 Epithelial (juvenile) corneal dystrophy: Secondary | ICD-10-CM | POA: Diagnosis not present

## 2017-08-09 DIAGNOSIS — H5212 Myopia, left eye: Secondary | ICD-10-CM | POA: Diagnosis not present

## 2017-08-09 DIAGNOSIS — Z9842 Cataract extraction status, left eye: Secondary | ICD-10-CM | POA: Diagnosis not present

## 2017-12-02 ENCOUNTER — Telehealth: Payer: Self-pay | Admitting: Family Medicine

## 2017-12-02 DIAGNOSIS — E559 Vitamin D deficiency, unspecified: Secondary | ICD-10-CM

## 2017-12-02 DIAGNOSIS — E785 Hyperlipidemia, unspecified: Secondary | ICD-10-CM

## 2017-12-02 DIAGNOSIS — M85859 Other specified disorders of bone density and structure, unspecified thigh: Secondary | ICD-10-CM

## 2017-12-02 NOTE — Telephone Encounter (Signed)
-----   Message from Eustace Pen, LPN sent at 69/05/8544  3:04 PM EDT ----- Regarding: Labs 10/8 Lab orders needed. Thank you.  Insurance:  [employer]

## 2017-12-05 ENCOUNTER — Ambulatory Visit (INDEPENDENT_AMBULATORY_CARE_PROVIDER_SITE_OTHER): Payer: Medicare HMO

## 2017-12-05 ENCOUNTER — Ambulatory Visit: Payer: Medicare HMO

## 2017-12-05 VITALS — BP 126/70 | HR 71 | Temp 97.5°F | Ht 65.5 in | Wt 222.5 lb

## 2017-12-05 DIAGNOSIS — Z23 Encounter for immunization: Secondary | ICD-10-CM | POA: Diagnosis not present

## 2017-12-05 DIAGNOSIS — Z0000 Encounter for general adult medical examination without abnormal findings: Secondary | ICD-10-CM

## 2017-12-05 DIAGNOSIS — E559 Vitamin D deficiency, unspecified: Secondary | ICD-10-CM

## 2017-12-05 DIAGNOSIS — E785 Hyperlipidemia, unspecified: Secondary | ICD-10-CM | POA: Diagnosis not present

## 2017-12-05 LAB — LIPID PANEL
CHOL/HDL RATIO: 3
Cholesterol: 178 mg/dL (ref 0–200)
HDL: 65.3 mg/dL (ref >39.00)
LDL Cholesterol: 85 mg/dL (ref 0–99)
NONHDL: 112.63
Triglycerides: 136 mg/dL (ref 0.0–149.0)
VLDL: 27.2 mg/dL (ref 0.0–40.0)

## 2017-12-05 LAB — COMPREHENSIVE METABOLIC PANEL
ALK PHOS: 70 U/L (ref 39–117)
ALT: 18 U/L (ref 0–35)
AST: 18 U/L (ref 0–37)
Albumin: 3.9 g/dL (ref 3.5–5.2)
BUN: 17 mg/dL (ref 6–23)
CHLORIDE: 103 meq/L (ref 96–112)
CO2: 31 meq/L (ref 19–32)
Calcium: 9.3 mg/dL (ref 8.4–10.5)
Creatinine, Ser: 0.71 mg/dL (ref 0.40–1.20)
GFR: 85.92 mL/min (ref >60.00)
GLUCOSE: 121 mg/dL — AB (ref 70–99)
POTASSIUM: 4.6 meq/L (ref 3.5–5.1)
SODIUM: 140 meq/L (ref 135–145)
TOTAL PROTEIN: 7.2 g/dL (ref 6.0–8.3)
Total Bilirubin: 0.6 mg/dL (ref 0.2–1.2)

## 2017-12-05 LAB — TSH: TSH: 3.44 u[IU]/mL (ref 0.35–4.50)

## 2017-12-05 LAB — VITAMIN D 25 HYDROXY (VIT D DEFICIENCY, FRACTURES): VITD: 18.44 ng/mL — AB (ref 30.00–100.00)

## 2017-12-05 NOTE — Patient Instructions (Signed)
Hannah Green , Thank you for taking time to come for your Medicare Wellness Visit. I appreciate your ongoing commitment to your health goals. Please review the following plan we discussed and let me know if I can assist you in the future.   These are the goals we discussed: Goals    . Increase physical activity     Starting 12/05/2017, I will continue to walk for 30 minutes 3 days per week.        This is a list of the screening recommended for you and due dates:  Health Maintenance  Topic Date Due  . Mammogram  01/02/2018  . Colon Cancer Screening  11/21/2020  . Tetanus Vaccine  06/01/2021  . Flu Shot  Completed  . DEXA scan (bone density measurement)  Completed  .  Hepatitis C: One time screening is recommended by Center for Disease Control  (CDC) for  adults born from [birth year] through 1965.   Completed  . Pneumonia vaccines  Completed   Preventive Care for Adults  A healthy lifestyle and preventive care can promote health and wellness. Preventive health guidelines for adults include the following key practices.  . A routine yearly physical is a good way to check with your health care provider about your health and preventive screening. It is a chance to share any concerns and updates on your health and to receive a thorough exam.  . Visit your dentist for a routine exam and preventive care every 6 months. Brush your teeth twice a day and floss once a day. Good oral hygiene prevents tooth decay and gum disease.  . The frequency of eye exams is based on your age, health, family medical history, use  of contact lenses, and other factors. Follow your health care provider's recommendations for frequency of eye exams.  . Eat a healthy diet. Foods like vegetables, fruits, whole grains, low-fat dairy products, and lean protein foods contain the nutrients you need without too many calories. Decrease your intake of foods high in solid fats, added sugars, and salt. Eat the right amount of  calories for you. Get information about a proper diet from your health care provider, if necessary.  . Regular physical exercise is one of the most important things you can do for your health. Most adults should get at least 150 minutes of moderate-intensity exercise (any activity that increases your heart rate and causes you to sweat) each week. In addition, most adults need muscle-strengthening exercises on 2 or more days a week.  Silver Sneakers may be a benefit available to you. To determine eligibility, you may visit the website: www.silversneakers.com or contact program at (769) [phone] Mon-Fri between 8AM-8PM.   . Maintain a healthy weight. The body mass index (BMI) is a screening tool to identify possible weight problems. It provides an estimate of body fat based on height and weight. Your health care provider can find your BMI and can help you achieve or maintain a healthy weight.   For adults 20 years and older: ? A BMI below 18.5 is considered underweight. ? A BMI of 18.5 to 24.9 is normal. ? A BMI of 25 to 29.9 is considered overweight. ? A BMI of 30 and above is considered obese.   . Maintain normal blood lipids and cholesterol levels by exercising and minimizing your intake of saturated fat. Eat a balanced diet with plenty of fruit and vegetables. Blood tests for lipids and cholesterol should begin at age 59 and be repeated every 5 years.  If your lipid or cholesterol levels are high, you are over 50, or you are at high risk for heart disease, you may need your cholesterol levels checked more frequently. Ongoing high lipid and cholesterol levels should be treated with medicines if diet and exercise are not working.  . If you smoke, find out from your health care provider how to quit. If you do not use tobacco, please do not start.  . If you choose to drink alcohol, please do not consume more than 2 drinks per day. One drink is considered to be 12 ounces (355 mL) of beer, 5 ounces  (148 mL) of wine, or 1.5 ounces (44 mL) of liquor.  . If you are 85-37 years old, ask your health care provider if you should take aspirin to prevent strokes.  . Use sunscreen. Apply sunscreen liberally and repeatedly throughout the day. You should seek shade when your shadow is shorter than you. Protect yourself by wearing long sleeves, pants, a wide-brimmed hat, and sunglasses year round, whenever you are outdoors.  . Once a month, do a whole body skin exam, using a mirror to look at the skin on your back. Tell your health care provider of new moles, moles that have irregular borders, moles that are larger than a pencil eraser, or moles that have changed in shape or color.

## 2017-12-05 NOTE — Progress Notes (Signed)
I reviewed health advisor's note, was available for consultation, and agree with documentation and plan.  

## 2017-12-05 NOTE — Progress Notes (Signed)
PCP notes:   Health maintenance:  Flu vaccine - administered Mammogram - addressed  Abnormal screenings:   Hearing - failed  Hearing Screening   125Hz  250Hz  500Hz  1000Hz  2000Hz  3000Hz  4000Hz  6000Hz  8000Hz   Right ear:   0 0 40  0    Left ear:   0 0 40  0     Depression score: 7 Depression screen [facility] 2/9 12/05/2017 11/24/2016 11/24/2015  Decreased Interest 0 1 0  Down, Depressed, Hopeless 1 0 0  PHQ - 2 Score 1 1 0  Altered sleeping 2 0 -  Tired, decreased energy 1 2 -  Change in appetite 2 2 -  Feeling bad or failure about yourself  1 1 -  Trouble concentrating 0 0 -  Moving slowly or fidgety/restless 0 0 -  Suicidal thoughts 0 0 -  PHQ-9 Score 7 6 -  Difficult doing work/chores Not difficult at all Not difficult at all -   Patient concerns:   Patient is concerned about hair loss. States hair is coming in large quantities.   Nurse concerns:  None  Next PCP appt:   12/12/17 @ 1000

## 2017-12-05 NOTE — Progress Notes (Signed)
Subjective:   Hannah Green is a 72 y.o. female who presents for Medicare Annual (Subsequent) preventive examination.  Review of Systems:  N/A Cardiac Risk Factors include: advanced age (>78men, >46 women);dyslipidemia;obesity (BMI >30kg/m2)     Objective:     Vitals: BP 126/70 (BP Location: Left Arm, Patient Position: Sitting, Cuff Size: Large)   Pulse 71   Temp (!) 97.5 F (36.4 C) (Oral)   Ht 5' 5.5" (1.664 m) Comment: no shoes  Wt 222 lb 8 oz (100.9 kg)   LMP  (LMP Unknown)   SpO2 98%   BMI 36.46 kg/m   Body mass index is 36.46 kg/m.  Advanced Directives 12/05/2017 11/24/2016 11/24/2015  Does Patient Have a Medical Advance Directive? Yes Yes Yes  Type of Paramedic of [place];Living will [place];Living will [place];Living will  Does patient want to make changes to medical advance directive? - - No - Patient declined  Copy of [place] in Chart? No - copy requested No - copy requested -    Tobacco Social History   Tobacco Use  Smoking Status Former Smoker  . Packs/day: 1.50  . Years: 50.00  . Pack years: 75.00  . Types: Cigarettes  . Last attempt to quit: 04/01/2011  . Years since quitting: 6.6  Smokeless Tobacco Never Used  Tobacco Comment   has stopped but has started again in "crisis"     Counseling given: No Comment: has stopped but has started again in "crisis"   Clinical Intake:  Pre-visit preparation completed: Yes  Pain : 0-10 Pain Score: 2  Pain Type: Chronic pain Pain Location: Generalized Pain Descriptors / Indicators: Other (Comment)(arthritic) Pain Onset: More than a month ago Pain Frequency: Constant     Nutritional Status: BMI > 30  Obese Nutritional Risks: None Diabetes: No  How often do you need to have someone help you when you read instructions, pamphlets, or other written materials from your doctor or pharmacy?: 1 - Never What is the last  grade level you completed in school?: 12th grade  Interpreter Needed?: No  Comments: pt is divorced and lives alone Information entered by :: LPinson, LPN  Past Medical History:  Diagnosis Date  . Cataract 2017   resolved with surgery  . GERD (gastroesophageal reflux disease)   . Hyperlipidemia   . Rheumatoid arthritis(714.0)    Past Surgical History:  Procedure Laterality Date  . BREAST LUMPECTOMY  1999   left, needed radiation  . CATARACT EXTRACTION W/ INTRAOCULAR LENS  IMPLANT, BILATERAL Bilateral 05/19/15, 06/02/15  . CHOLECYSTECTOMY    . NM MYOVIEW LTD  01/2016   LOW RISK. Hypertensive response to exercise followed by significant drop (stage I pressure 211/70 drop to 118/75. Stage II). Apical artifact but otherwise no ischemia or infarction. EF 64%.  . TONSILLECTOMY    . TRANSTHORACIC ECHOCARDIOGRAM  01/12/2016   Mild LVH. EF 60-65%. GR 2-DD   Family History  Problem Relation Age of Onset  . Cancer Mother        breast  . Cancer Father        lung cancer  . Cancer Sister        throat cancer  . Diabetes Paternal Uncle   . Diabetes Maternal Grandmother    Social History   Socioeconomic History  . Marital status: Divorced    Spouse name: Not on file  . Number of children: 2  . Years of education: Not on  file  . Highest education level: Not on file  Occupational History  . Occupation: Retired Freight forwarder    Comment: Patent examiner Needs  . Financial resource strain: Not on file  . Food insecurity:    Worry: Not on file    Inability: Not on file  . Transportation needs:    Medical: Not on file    Non-medical: Not on file  Tobacco Use  . Smoking status: Former Smoker    Packs/day: 1.50    Years: 50.00    Pack years: 75.00    Types: Cigarettes    Last attempt to quit: 04/01/2011    Years since quitting: 6.6  . Smokeless tobacco: Never Used  . Tobacco comment: has stopped but has started again in "crisis"  Substance and Sexual Activity  . Alcohol use:  Yes    Alcohol/week: 2.0 standard drinks    Types: 2 Glasses of wine per week  . Drug use: No  . Sexual activity: Never  Lifestyle  . Physical activity:    Days per week: Not on file    Minutes per session: Not on file  . Stress: Not on file  Relationships  . Social connections:    Talks on phone: Not on file    Gets together: Not on file    Attends religious service: Not on file    Active member of club or organization: Not on file    Attends meetings of clubs or organizations: Not on file    Relationship status: Not on file  Other Topics Concern  . Not on file  Social History Narrative   Lives alone   No living will   Requests friend Porfirio Mylar to make health care decisions.   Would accept resuscitation but no prolonged ventilation   Would not want tube feeds if cognitively unaware    Outpatient Encounter Medications as of 12/05/2017  Medication Sig  . albuterol (PROVENTIL HFA) 108 (90 Base) MCG/ACT inhaler Inhale 2 puffs into the lungs every 6 (six) hours as needed for wheezing or shortness of breath.  Marland Kitchen atorvastatin (LIPITOR) 10 MG tablet Take 1 tablet (10 mg total) by mouth daily.  . Coenzyme Q10 (CO Q-10) 100 MG CAPS Take 300 mg by mouth daily. (Patient taking differently: Take 400 mg by mouth daily. )  . Fluticasone-Salmeterol (ADVAIR) 250-50 MCG/DOSE AEPB Inhale 1 puff into the lungs every 12 (twelve) hours.  . metroNIDAZOLE (METROGEL) 1 % gel Apply topically daily. (Patient not taking: Reported on 12/05/2017)  . [DISCONTINUED] azithromycin (ZITHROMAX) 250 MG tablet 2 tab po x 1 day then 1 tab po daily   No facility-administered encounter medications on file as of 12/05/2017.     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 N  Difficulty concentrating or making decisions? N  Walking or climbing stairs? Y  Comment Shortness  of breath and heart palpitations  Dressing or bathing? N  Doing errands, shopping? N  Preparing Food and eating ? N  Using the Toilet? N  In the past six months, have you accidently leaked urine? N  Do you have problems with loss of bowel control? N  Managing your Medications? N  Managing your Finances? N  Housekeeping or managing your Housekeeping? N  Some recent data might be hidden    Patient Care Team: Jinny Sanders, MD as PCP -  General Thelma Comp, [place] as Consulting Physician (Optometry) Vevelyn Royals, MD as Consulting Physician (Ophthalmology) Carloyn Manner, MD as Referring Physician (Otolaryngology)    Assessment:   This is a routine wellness examination for [place].   Hearing Screening   125Hz  250Hz  500Hz  1000Hz  2000Hz  3000Hz  4000Hz  6000Hz  8000Hz   Right ear:   0 0 40  0    Left ear:   0 0 40  0    Vision Screening Comments: Vision exam in 2018 with Dr. Thelma Comp    Exercise Activities and Dietary recommendations Current Exercise Habits: Home exercise routine, Type of exercise: walking, Time (Minutes): 60, Frequency (Times/Week): 3, Weekly Exercise (Minutes/Week): 180, Intensity: Mild, Exercise limited by: None identified  Goals    . Increase physical activity     Starting 12/05/2017, I will continue to walk for 30 minutes 3 days per week.        Fall Risk Fall Risk  12/05/2017 11/24/2016 11/24/2015  Falls in the past year? No No No   Depression Screen PHQ 2/9 Scores 12/05/2017 11/24/2016 11/24/2015  PHQ - 2 Score 1 1 0  PHQ- 9 Score 7 6 -     Cognitive Function MMSE - Mini Mental State Exam 12/05/2017 11/24/2016 11/24/2015  Orientation to time 5 5 5   Orientation to Place 5 5 5   Registration 3 3 3   Attention/ Calculation 0 0 0  Recall 3 3 3   Language- name 2 objects 0 0 0  Language- repeat 1 1 1   Language- follow 3 step command 3 3 3   Language- read & follow direction 0 0 0  Write a sentence 0 0 0  Copy design 0 0 0  Total score 20 20 20      PLEASE NOTE: A Mini-Cog screen was completed. Maximum score is 20. A value of 0 denotes this part of [place] MMSE was not completed or the patient failed this part of the Mini-Cog screening.   Mini-Cog Screening Orientation to Time - Max 5 pts Orientation to Place - Max 5 pts Registration - Max 3 pts Recall - Max 3 pts Language Repeat - Max 1 pts Language Follow 3 Step Command - Max 3 pts      Immunization History  Administered Date(s) Administered  . Influenza, High Dose Seasonal PF 12/05/2017  . Influenza,inj,Quad PF,6+ Mos 12/22/2015, 11/24/2016  . Pneumococcal Conjugate-13 12/22/2015  . Pneumococcal Polysaccharide-23 06/02/2011  . Td 06/02/2011      Screening Tests Health Maintenance  Topic Date Due  . MAMMOGRAM  01/02/2018  . COLONOSCOPY  11/21/2020  . TETANUS/TDAP  06/01/2021  . INFLUENZA VACCINE  Completed  . DEXA SCAN  Completed  . Hepatitis C Screening  Completed  . PNA vac Low Risk Adult  Completed      Plan:     I have personally reviewed, addressed, and noted the following in the patient's chart:  A. Medical and social history B. Use of alcohol, tobacco or illicit drugs  C. Current medications and supplements D. Functional ability and status E.  Nutritional status F.  Physical activity G. Advance directives H. List of other physicians I.  Hospitalizations, surgeries, and ER visits in previous 12 months J.  [place] to include hearing, vision, cognitive, depression L. Referrals and appointments - none  In addition, I have reviewed and discussed with patient certain preventive protocols, quality metrics, and best practice recommendations. A written personalized care plan for preventive services as well as general preventive health recommendations were provided to  patient.  See attached scanned questionnaire for additional information.   Signed,   Lindell Noe, MHA, BS, LPN Health Coach

## 2017-12-12 ENCOUNTER — Other Ambulatory Visit: Payer: Self-pay | Admitting: Family Medicine

## 2017-12-12 ENCOUNTER — Ambulatory Visit (INDEPENDENT_AMBULATORY_CARE_PROVIDER_SITE_OTHER): Payer: Medicare HMO | Admitting: Family Medicine

## 2017-12-12 ENCOUNTER — Encounter: Payer: Self-pay | Admitting: Family Medicine

## 2017-12-12 VITALS — BP 140/76 | HR 73 | Temp 98.5°F | Ht 65.5 in | Wt 228.5 lb

## 2017-12-12 DIAGNOSIS — L659 Nonscarring hair loss, unspecified: Secondary | ICD-10-CM

## 2017-12-12 DIAGNOSIS — J449 Chronic obstructive pulmonary disease, unspecified: Secondary | ICD-10-CM | POA: Diagnosis not present

## 2017-12-12 DIAGNOSIS — Z0000 Encounter for general adult medical examination without abnormal findings: Secondary | ICD-10-CM

## 2017-12-12 DIAGNOSIS — R7303 Prediabetes: Secondary | ICD-10-CM | POA: Diagnosis not present

## 2017-12-12 DIAGNOSIS — E785 Hyperlipidemia, unspecified: Secondary | ICD-10-CM | POA: Diagnosis not present

## 2017-12-12 DIAGNOSIS — E559 Vitamin D deficiency, unspecified: Secondary | ICD-10-CM

## 2017-12-12 LAB — POCT GLYCOSYLATED HEMOGLOBIN (HGB A1C): HEMOGLOBIN A1C: 5.6 % (ref 4.0–5.6)

## 2017-12-12 MED ORDER — VITAMIN D3 1.25 MG (50000 UT) PO CAPS: 1.0000 | ORAL_CAPSULE | ORAL | 0 refills | Status: DC

## 2017-12-12 MED ORDER — FLUTICASONE-SALMETEROL 250-50 MCG/DOSE IN AEPB: 1.0000 | INHALATION_SPRAY | Freq: Two times a day (BID) | RESPIRATORY_TRACT | 11 refills | Status: DC

## 2017-12-12 MED ORDER — METRONIDAZOLE 1 % EX GEL: Freq: Every day | CUTANEOUS | 1 refills | Status: DC

## 2017-12-12 NOTE — Progress Notes (Signed)
Subjective:    Patient ID: Hannah Green, female    DOB: [birth year], 72 y.o.   MRN: [id number]  HPI  The patient presents for complete physical and review of chronic health problems. He/She also has the following acute concerns today: hair loss  Health maintenance:  Flu vaccine - administered Mammogram - addressed  Abnormal screenings:   Hearing - failed             Hearing Screening   125Hz  250Hz  500Hz  1000Hz  2000Hz  3000Hz  4000Hz  6000Hz  8000Hz   Right ear:   0 0 40  0    Left ear:   0 0 40  0     Has seen audiologist in past but cannot afford hearing aid.  Depression score: 7 Depression screen [facility] 2/9 12/05/2017 11/24/2016 11/24/2015  Decreased Interest 0 1 0  Down, Depressed, Hopeless 1 0 0  PHQ - 2 Score 1 1 0  Altered sleeping 2 0 -  Tired, decreased energy 1 2 -  Change in appetite 2 2 -  Feeling bad or failure about yourself  1 1 -  Trouble concentrating 0 0 -  Moving slowly or fidgety/restless 0 0 -  Suicidal thoughts 0 0 -  PHQ-9 Score 7 6 -  Difficult doing work/chores Not difficult at all Not difficult at all -   Patient concerns:   Patient is concerned about hair loss. States hair is coming in large quantities.   12/12/17    She has noted weight gain, decreased energy,  Hair loss in last year.. Thinning. Wt Readings from Last 3 Encounters:  12/12/17 228 lb 8 oz (103.6 kg)  12/05/17 222 lb 8 oz (100.9 kg)  04/27/17 225 lb 12 oz (102.4 kg)      Elevated Cholesterol:  Much improved from last year on  lipitor 10 mg daily! Lab Results  Component Value Date   CHOL 178 12/05/2017   HDL 65.30 12/05/2017   LDLCALC 85 12/05/2017   LDLDIRECT 136.2 06/03/2011   TRIG 136.0 12/05/2017   CHOLHDL 3 12/05/2017  Using medications without problems: Muscle aches:  Diet compliance: no fried foods, veggies, protein, has trouble with carbs. Exercise: walking 3 time a week Other complaints:  prediabetes  Lab Results  Component Value Date   HGBA1C 5.7 11/24/2016     COPD:  She has noted gradual worsening in breathing. Using albuterol. Taking  advair daily. She feels worse in last   Sneeze, watery eyes. Sneeze... In last few months.   Social History /Family History/Past Medical History reviewed in detail and updated in EMR if needed. Blood pressure 140/76, pulse 73, temperature 98.5 F (36.9 C), temperature source Oral, height 5' 5.5" (1.664 m), weight 228 lb 8 oz (103.6 kg).  Review of Systems  Constitutional: Positive for fatigue. Negative for fever.  HENT: Negative for congestion.   Eyes: Negative for pain.  Respiratory: Negative for cough and shortness of breath.   Cardiovascular: Negative for chest pain, palpitations and leg swelling.  Gastrointestinal: Negative for abdominal pain.  Genitourinary: Negative for dysuria and vaginal bleeding.  Musculoskeletal: Negative for back pain.  Neurological: Negative for syncope, light-headedness and headaches.  Psychiatric/Behavioral: Negative for dysphoric mood.       Objective:   Physical Exam  Constitutional: Vital signs are normal. She appears well-developed and well-nourished. She is cooperative.  Non-toxic appearance. She does not appear ill. No distress.  HENT:  Head: Normocephalic.  Right Ear: Hearing, tympanic membrane, external ear and ear canal normal.  Left Ear: Hearing, tympanic membrane, external ear and ear canal normal.  Nose: Nose normal.  Eyes: Pupils are equal, round, and reactive to light. Conjunctivae, EOM and lids are normal. Lids are everted and swept, no foreign bodies found.  Neck: Trachea normal and normal range of motion. Neck supple. Carotid bruit is not present. No thyroid mass and no thyromegaly present.  Cardiovascular: Normal rate, regular rhythm, S1 normal, S2 normal, normal heart sounds and intact distal pulses. Exam reveals no gallop.  No murmur heard. Pulmonary/Chest: Effort normal and breath sounds normal. No respiratory distress. She  has no wheezes. She has no rhonchi. She has no rales.  Abdominal: Soft. Normal appearance and bowel sounds are normal. She exhibits no distension, no fluid wave, no abdominal bruit and no mass. There is no hepatosplenomegaly. There is no tenderness. There is no rebound, no guarding and no CVA tenderness. No hernia.  Lymphadenopathy:    She has no cervical adenopathy.    She has no axillary adenopathy.  Neurological: She is alert. She has normal strength. No cranial nerve deficit or sensory deficit.  Skin: Skin is warm, dry and intact. No rash noted.  Psychiatric: Her speech is normal and behavior is normal. Judgment normal. Her mood appears not anxious. Cognition and memory are normal. She does not exhibit a depressed mood.          Assessment & Plan:  The patient's preventative maintenance and recommended screening tests for an annual wellness exam were reviewed in full today. Brought up to date unless services declined.  Counselled on the importance of diet, exercise, and its role in overall health and mortality. The patient's FH and SH was reviewed, including their home life, tobacco status, and drug and alcohol status.   Vaccines: PCV 13, 23 and flu uptodate. Due for shingles.. Refused not covered by insurance. Mammo: nml 2018, due nowHx of breast CA. Last pap age 65, no further indicated, No DVE , low risk, no family history of ovarian or uterine cancer. DEXA: 12/2015, plan repeat in 2022 Colon: Last 02/2010 nml, Dr. Collene Mares. Repeat in 5years,  Plans. Hep C: neg Former smoker, >40 years: candidate for lung cancer screening.  spirometry 2017 COPD

## 2017-12-12 NOTE — Patient Instructions (Addendum)
Start zyrtec at bedtime as needed for allergies.  Call if breathing is not continuing to improve over the next few weeks.  Start vit D weekly supplement.  call to set up colonoscopy.

## 2017-12-13 NOTE — Telephone Encounter (Signed)
Is generic metonidazole gel $166 dollars or just Metrogel? If generic affordable change to that.

## 2017-12-13 NOTE — Telephone Encounter (Signed)
Spoke with pharmacist at [facility] today and was advised that it was generic metronidazole and it would cost patient $100 out of pocket.  I spoke with Hannah Green and advised her of this.  I also told her about Good[employer] where we can print a coupon for her to pay out of pocket $85.55 without filing with her insurance.  Patient is agreeable to the GoodRx coupon.  Coupon printed for patient to pick up at the front desk.

## 2018-01-04 DIAGNOSIS — R635 Abnormal weight gain: Secondary | ICD-10-CM | POA: Diagnosis not present

## 2018-01-04 DIAGNOSIS — K219 Gastro-esophageal reflux disease without esophagitis: Secondary | ICD-10-CM | POA: Diagnosis not present

## 2018-01-04 DIAGNOSIS — Z1211 Encounter for screening for malignant neoplasm of colon: Secondary | ICD-10-CM | POA: Diagnosis not present

## 2018-01-04 DIAGNOSIS — K5730 Diverticulosis of large intestine without perforation or abscess without bleeding: Secondary | ICD-10-CM | POA: Diagnosis not present

## 2018-01-08 DIAGNOSIS — R7303 Prediabetes: Secondary | ICD-10-CM | POA: Insufficient documentation

## 2018-01-08 NOTE — Assessment & Plan Note (Signed)
Improving on lipitor 10 mg daily.

## 2018-01-08 NOTE — Assessment & Plan Note (Signed)
Replete.. likely causing fatigue at least in part.

## 2018-01-08 NOTE — Assessment & Plan Note (Signed)
Reviewed labs .Marland Kitchen TSh nml. Vit D low.

## 2018-01-08 NOTE — Assessment & Plan Note (Signed)
Stable control. Encouraged exercise, weight loss, healthy eating habits.  

## 2018-01-08 NOTE — Assessment & Plan Note (Signed)
No clear exacerbation or sign of infection. Just gradual decline.  does have some allergy symptoms poorly control.. Treat with zyrtec and call if not improving.  Consider increase or change in Advair.

## 2018-02-08 ENCOUNTER — Inpatient Hospital Stay: Admission: RE | Admit: 2018-02-08 | Payer: Medicare HMO | Source: Ambulatory Visit

## 2018-02-14 ENCOUNTER — Ambulatory Visit (INDEPENDENT_AMBULATORY_CARE_PROVIDER_SITE_OTHER): Payer: Medicare HMO | Admitting: Family Medicine

## 2018-02-14 ENCOUNTER — Encounter: Payer: Self-pay | Admitting: Family Medicine

## 2018-02-14 VITALS — BP 120/60 | HR 77 | Temp 98.8°F | Ht 65.5 in | Wt 223.5 lb

## 2018-02-14 DIAGNOSIS — J189 Pneumonia, unspecified organism: Secondary | ICD-10-CM | POA: Diagnosis not present

## 2018-02-14 DIAGNOSIS — J069 Acute upper respiratory infection, unspecified: Secondary | ICD-10-CM | POA: Diagnosis not present

## 2018-02-14 DIAGNOSIS — J441 Chronic obstructive pulmonary disease with (acute) exacerbation: Secondary | ICD-10-CM

## 2018-02-14 MED ORDER — DOXYCYCLINE HYCLATE 100 MG PO TABS: 100.0000 mg | ORAL_TABLET | Freq: Two times a day (BID) | ORAL | 0 refills | Status: AC

## 2018-02-14 NOTE — Progress Notes (Signed)
Dr. Frederico Hamman T. Dustin Bumbaugh, MD, [facility] [place], [address] Phone: [phone] Fax: [phone]  02/14/2018  Patient: Hannah Green, MRN: [id number], DOB: October 26, 1945, 72 y.o.  Primary Physician:  Jinny Sanders, MD   Chief Complaint  Patient presents with  . Cough    x 1.5 week with green phelgm  . Ear Feel Clogged  . Headache  . Fever    early on with illness  . Nasal Congestion   Subjective:   Hannah Green is a 72 y.o. very pleasant female patient who presents with the following:  Patient with COPD presents with acute illness.  She is a nice patient, and she has some COPD that is controlled with Advair at baseline.  She has been using her rescue inhaler.  She does not regularly take oral prednisone, it does not look like she is had any since 2015 in the chart.  She has been sick for about 10 days and she has some coughing and wheezing at nighttime with a productive cough of green-yellow sputum.  She also has some clogging in her ears, headache, and she did have some fever last week.  She reports to me that she thought that this was 103.  She also has had extensive nasal congestion.  Past Medical History, Surgical History, Social History, Family History, Problem List, Medications, and Allergies have been reviewed and updated if relevant.  Patient Active Problem List   Diagnosis Date Noted  . Prediabetes 01/08/2018  . COPD exacerbation ([place]) 04/27/2017  . COPD (chronic obstructive pulmonary disease) ([place]) 12/07/2016  . Chest pain with moderate risk for cardiac etiology 02/01/2016  . 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 Osteopenia of femoral neck 01/12/2016  . Rosacea 12/22/2015  . Cough 12/03/2015  . Constipation, chronic 11/11/2014  . Fatigue 11/11/2014  . Basal cell carcinoma of left side of nose 06/01/2012  . Alopecia 06/01/2012  . Routine general medical examination at a  health care facility 06/02/2011  . GERD (gastroesophageal reflux disease)   . Vitamin D deficiency 02/13/2008  . ADENOCARCINOMA, BREAST 02/08/2008  . Hyperlipidemia with target LDL less than 100 02/08/2008  . RHEUMATOID ARTHRITIS 02/08/2008    Past Medical History:  Diagnosis Date  . Cataract 2017   resolved with surgery  . GERD (gastroesophageal reflux disease)   . Hyperlipidemia   . Rheumatoid arthritis(714.0)     Past Surgical History:  Procedure Laterality Date  . BREAST LUMPECTOMY  1999   left, needed radiation  . CATARACT EXTRACTION W/ INTRAOCULAR LENS  IMPLANT, BILATERAL Bilateral 05/19/15, 06/02/15  . CHOLECYSTECTOMY    . NM MYOVIEW LTD  01/2016   LOW RISK. Hypertensive response to exercise followed by significant drop (stage I pressure 211/70 drop to 118/75. Stage II). Apical artifact but otherwise no ischemia or infarction. EF 64%.  . TONSILLECTOMY    . TRANSTHORACIC ECHOCARDIOGRAM  01/12/2016   Mild LVH. EF 60-65%. GR 2-DD    Social History   Socioeconomic History  . Marital status: Divorced    Spouse name: Not on file  . Number of children: 2  . Years of education: Not on file  . Highest education level: Not on file  Occupational History  . Occupation: Retired Freight forwarder    Comment: Patent examiner Needs  . Financial resource strain: Not on file  . Food insecurity:    Worry: Not on file    Inability: Not on  file  . Transportation needs:    Medical: Not on file    Non-medical: Not on file  Tobacco Use  . Smoking status: Former Smoker    Packs/day: 1.50    Years: 50.00    Pack years: 75.00    Types: Cigarettes    Last attempt to quit: 04/01/2011    Years since quitting: 6.8  . Smokeless tobacco: Never Used  . Tobacco comment: has stopped but has started again in "crisis"  Substance and Sexual Activity  . Alcohol use: Yes    Alcohol/week: 2.0 standard drinks    Types: 2 Glasses of wine per week  . Drug use: No  . Sexual activity: Never    Lifestyle  . Physical activity:    Days per week: Not on file    Minutes per session: Not on file  . Stress: Not on file  Relationships  . Social connections:    Talks on phone: Not on file    Gets together: Not on file    Attends religious service: Not on file    Active member of club or organization: Not on file    Attends meetings of clubs or organizations: Not on file    Relationship status: Not on file  . Intimate partner violence:    Fear of current or ex partner: Not on file    Emotionally abused: Not on file    Physically abused: Not on file    Forced sexual activity: Not on file  Other Topics Concern  . Not on file  Social History Narrative   Lives alone   No living will   Requests friend Porfirio Mylar to make health care decisions.   Would accept resuscitation but no prolonged ventilation   Would not want tube feeds if cognitively unaware    Family History  Problem Relation Age of Onset  . Cancer Mother        breast  . Cancer Father        lung cancer  . Cancer Sister        throat cancer  . Diabetes Paternal Uncle   . Diabetes Maternal Grandmother     Allergies  Allergen Reactions  . Varenicline Tartrate     REACTION: intolerant  . Penicillins     REACTION: As a child Vague rash only    Medication list reviewed and updated in full in [facility].  ROS: GEN: Acute illness details above GI: Tolerating PO intake GU: maintaining adequate hydration and urination Pulm: No SOB Interactive and getting along well at home.  Otherwise, ROS is as per the HPI.  Objective:   BP 120/60   Pulse 77   Temp 98.8 F (37.1 C) (Oral)   Ht 5' 5.5" (1.664 m)   Wt 223 lb 8 oz (101.4 kg)   LMP  (LMP Unknown)   SpO2 96%   BMI 36.63 kg/m    Gen: WDWN, NAD; A & O x3, cooperative. Pleasant.Globally Non-toxic HEENT: Normocephalic and atraumatic. Throat clear, w/o exudate, R TM clear, L TM - good landmarks, No fluid present. rhinnorhea. No frontal or  maxillary sinus T. MMM NECK: Anterior cervical  LAD is present CV: RRR, No M/G/R, cap refill <2 sec PULM: Breathing comfortably in no respiratory distress. no wheezing, + basilar b crackles, no rhonchi ABD: S,NT,ND,+BS. No HSM. No rebound. EXT: No c/c/e PSYCH: Friendly, good eye contact MSK: Nml gait    Laboratory and Imaging Data:  Assessment and Plan:  Community acquired pneumonia, unspecified laterality  URI, acute  COPD exacerbation ([place])  Bilateral lung crackles, bases, will treat with doxycycline.  Mild COPD exacerbation.  Beta agonist for now, and if worsens, asked her to call and we can send in some prednisone.  Otherwise continue with supportive care.  Follow-up: No follow-ups on file.  Meds ordered this encounter  Medications  . doxycycline (VIBRA-TABS) 100 MG tablet    Sig: Take 1 tablet (100 mg total) by mouth 2 (two) times daily for 10 days.    Dispense:  20 tablet    Refill:  0   No orders of the defined types were placed in this encounter.   Signed,  Maud Deed. Mayara Paulson, MD   Outpatient Encounter Medications as of 02/14/2018  Medication Sig  . atorvastatin (LIPITOR) 10 MG tablet Take 1 tablet (10 mg total) by mouth daily.  . Cholecalciferol (VITAMIN D3) 50000 units CAPS Take 1 capsule by mouth once a week.  . Coenzyme Q10 (CO Q-10) 100 MG CAPS Take 300 mg by mouth daily. (Patient taking differently: Take 400 mg by mouth daily. )  . Fluticasone-Salmeterol (ADVAIR) 250-50 MCG/DOSE AEPB Inhale 1 puff into the lungs every 12 (twelve) hours.  . metroNIDAZOLE (METROGEL) 1 % gel Apply topically daily.  . VENTOLIN HFA 108 (90 Base) MCG/ACT inhaler TAKE 2 PUFFS BY MOUTH EVERY 6 HOURS AS NEEDED FOR WHEEZE OR SHORTNESS OF BREATH  . doxycycline (VIBRA-TABS) 100 MG tablet Take 1 tablet (100 mg total) by mouth 2 (two) times daily for 10 days.   No facility-administered encounter medications on file as of 02/14/2018.

## 2018-02-28 ENCOUNTER — Other Ambulatory Visit: Payer: Self-pay | Admitting: Family Medicine

## 2018-02-28 DIAGNOSIS — C3491 Malignant neoplasm of unspecified part of right bronchus or lung: Secondary | ICD-10-CM

## 2018-02-28 HISTORY — DX: Malignant neoplasm of unspecified part of right bronchus or lung: C34.91

## 2018-03-01 ENCOUNTER — Ambulatory Visit (INDEPENDENT_AMBULATORY_CARE_PROVIDER_SITE_OTHER)
Admission: RE | Admit: 2018-03-01 | Discharge: 2018-03-01 | Disposition: A | Discharge status: Home / Self Care | Payer: Medicare HMO | Source: Ambulatory Visit | Attending: Acute Care | Admitting: Acute Care

## 2018-03-01 DIAGNOSIS — Z87891 Personal history of nicotine dependence: Secondary | ICD-10-CM

## 2018-03-01 DIAGNOSIS — Z122 Encounter for screening for malignant neoplasm of respiratory organs: Secondary | ICD-10-CM

## 2018-03-01 NOTE — Telephone Encounter (Signed)
Last office visit 02/14/18 with Dr. Lorelei Pont for PNA.  Last refilled 12/12/2017 for #12 with no refills.  Last Vit D level low at 18.44 ng/ml on 12/05/2017.  Next Appt: 12/25/2018 for CPE.  Ok to refill?

## 2018-03-05 ENCOUNTER — Ambulatory Visit: Payer: Medicare HMO | Admitting: Acute Care

## 2018-03-05 ENCOUNTER — Encounter: Payer: Self-pay | Admitting: Acute Care

## 2018-03-05 VITALS — BP 126/76 | HR 71

## 2018-03-05 DIAGNOSIS — R911 Solitary pulmonary nodule: Secondary | ICD-10-CM | POA: Diagnosis not present

## 2018-03-05 DIAGNOSIS — Z716 Tobacco abuse counseling: Secondary | ICD-10-CM

## 2018-03-05 DIAGNOSIS — J449 Chronic obstructive pulmonary disease, unspecified: Secondary | ICD-10-CM | POA: Diagnosis not present

## 2018-03-05 NOTE — Patient Instructions (Addendum)
It is nice to meet you today. We will schedule a PET scan and Pulmonary Function Tests. We will call you with the results of the PET scan. Continue using your advair 1 puff in the morning and 1 puff in the evening( Maintenance inhaler) Rinse mouth after use Use your Albuterol inhaler as rescue for breakthrough shortness of breath or wheezing.  Follow up after PET as needed.  Follow up with cardiology as we discussed. Please contact office for sooner follow up if symptoms do not improve or worsen or seek emergency care

## 2018-03-05 NOTE — Assessment & Plan Note (Deleted)
I have spent 5 minutes counseling patient on smoking cessation this visit. Patient verbalizes understanding of their  choice to continue smoking and the negative health consequences including worsening of COPD, risk of lung cancer , stroke and heart disease.Marland Kitchen

## 2018-03-05 NOTE — Progress Notes (Signed)
History of Present Illness Hannah Green is a 73 y.o. female former smoker with a 75 pack year smoking history ( Quit in 2013), and COPD  seen through the Lung Screening Program.Her scan revealed a Lung RADS 4 B which indicates suspicious findings for which additional diagnostic testing and or tissue sampling is recommended.She is here for follow up with her son.   03/05/2018  Pt. Presents with her son for follow up. She had her low dose CT screening scan done on 02/28/2018. The screening scan results are noted below. Lung RADS 4 B indicates suspicious findings for which additional diagnostic testing and or tissue sampling is recommended. There is a nodule in the anterior right upper lobe that has increased in  Size from 5.6 mm to 9.0 mm within the last 13 months.We discussed that with her smoking history we would need to  Do some diagnostic testing . We discussed the possibility that this may be cancer. We discussed that a PET scan and PFT's would be the next step.We discussed that she needed to quit smoking. She has not had any weight loss. She has not had a chronic cough. She has not experienced any hemoptysis. She uses her Advair, but she does not use it every day. Additionally she uses her Albuterol inhaler as needed.She has exertional dyspnea. She denies fever , chest pain, orthopnea or hemoptysis.  Test Results:  LDCT 02/28/2018 Multiple small pulmonary nodules are noted in the lungs bilaterally. Although several of these are stable compared to the prior study, one has clearly enlarged. Specifically, in the anterior aspect of the right upper lobe immediately above the minor fissure (axial image 133 of series 3), with a volume derived mean diameter 9.0 mm. No acute consolidative airspace disease. No pleural effusions. Diffuse bronchial wall thickening with mild centrilobular and mild-to-moderate paraseptal emphysema.  Lung-RADS 4BS, suspicious. Additional imaging evaluation  or consultation with Pulmonology or Thoracic Surgery recommended. The "S" modifier above refers to potentially clinically significant non lung cancer related findings. Specifically, Aortic atherosclerosis, in addition to 2 vessel coronary artery disease. Assessment for potential risk factor modification, dietary therapy or pharmacologic therapy may be warranted, if clinically indicated. Mild diffuse bronchial wall thickening with mild centrilobular and mild-to-moderate paraseptal emphysema; imaging findings suggestive of underlying COPD.  12/2015 Echo: EF 60-65% Wall thickness was   increased in a pattern of mild LVH. Systolic function was normal.   The estimated ejection fraction was in the range of 60% to 65%.   Wall motion was normal; there were no regional wall motion   abnormalities. Features are consistent with a pseudonormal left   ventricular filling pattern, with concomitant abnormal relaxation   and increased filling pressure (grade 2 diastolic dysfunction).  11/2015 Spirometry Mild restriction F/F ratio>> 74% FVC>>2.3 or 72% predicted FEV1>> 1.7 or 70% predicted  CBC Latest Ref Rng & Units 11/24/2015 11/11/2014 06/01/2012  WBC 4.0 - 10.5 K/uL 8.9 8.8 12.1(H)  Hemoglobin 12.0 - 15.0 g/dL 14.6 14.2 12.5  Hematocrit 36.0 - 46.0 % 43.0 42.0 37.7  Platelets 150.0 - 400.0 K/uL 327.0 350.0 342    BMP Latest Ref Rng & Units 12/05/2017 11/24/2016 11/24/2015  Glucose 70 - 99 mg/dL 121(H) 100(H) 108(H)  BUN 6 - 23 mg/dL 17 15 17   Creatinine 0.40 - 1.20 mg/dL 0.71 0.71 0.64  Sodium 135 - 145 mEq/L 140 141 138  Potassium 3.5 - 5.1 mEq/L 4.6 4.8 4.9  Chloride 96 - 112 mEq/L 103 104 103  CO2 19 -  32 mEq/L 31 33(H) 32  Calcium 8.4 - 10.5 mg/dL 9.3 9.5 8.8    BNP No results found for: BNP  ProBNP No results found for: PROBNP  PFT No results found for: FEV1PRE, FEV1POST, FVCPRE, FVCPOST, TLC, DLCOUNC, PREFEV1FVCRT, PSTFEV1FVCRT  Ct Chest Lung Ca Screen Low Dose W/o  Cm  Result Date: 03/01/2018 CLINICAL DATA:  73 year old female former smoker (quit 2015) with 75 pack-year history of smoking. Lung cancer screening examination. EXAM: CT CHEST WITHOUT CONTRAST LOW-DOSE FOR LUNG CANCER SCREENING TECHNIQUE: Multidetector CT imaging of the chest was performed following the standard protocol without IV contrast. COMPARISON:  Low-dose lung cancer screening chest CT 02/01/2017. FINDINGS: Cardiovascular: Heart size is normal. There is no significant pericardial fluid, thickening or pericardial calcification. There is aortic atherosclerosis, as well as atherosclerosis of the great vessels of the mediastinum and the coronary arteries, including calcified atherosclerotic plaque in the left anterior descending and right coronary arteries. Mediastinum/Nodes: No pathologically enlarged mediastinal or hilar lymph nodes. Moderate-sized hiatal hernia. No axillary lymphadenopathy. Surgical clips in the right axillary region from prior lymph node dissection. Lungs/Pleura: Multiple small pulmonary nodules are noted in the lungs bilaterally. Although several of these are stable compared to the prior study, one has clearly enlarged. Specifically, in the anterior aspect of the right upper lobe immediately above the minor fissure (axial image 133 of series 3), with a volume derived mean diameter 9.0 mm. No acute consolidative airspace disease. No pleural effusions. Diffuse bronchial wall thickening with mild centrilobular and mild-to-moderate paraseptal emphysema. Upper Abdomen: Aortic atherosclerosis. Musculoskeletal: There are no aggressive appearing lytic or blastic lesions noted in the visualized portions of the skeleton. IMPRESSION: 1. Lung-RADS 4BS, suspicious. Additional imaging evaluation or consultation with Pulmonology or Thoracic Surgery recommended. 2. The "S" modifier above refers to potentially clinically significant non lung cancer related findings. Specifically, Aortic atherosclerosis,  in addition to 2 vessel coronary artery disease. Assessment for potential risk factor modification, dietary therapy or pharmacologic therapy may be warranted, if clinically indicated. 3. Mild diffuse bronchial wall thickening with mild centrilobular and mild-to-moderate paraseptal emphysema; imaging findings suggestive of underlying COPD. Aortic Atherosclerosis (ICD10-I70.0) and Emphysema (ICD10-J43.9). Electronically Signed   By: Vinnie Langton M.D.   On: 03/01/2018 11:30     Past medical hx Past Medical History:  Diagnosis Date  . Cataract 2017   resolved with surgery  . GERD (gastroesophageal reflux disease)   . Hyperlipidemia   . Rheumatoid arthritis(714.0)      Social History   Tobacco Use  . Smoking status: Former Smoker    Packs/day: 1.50    Years: 50.00    Pack years: 75.00    Types: Cigarettes    Last attempt to quit: 04/01/2011    Years since quitting: 6.9  . Smokeless tobacco: Never Used  . Tobacco comment: has stopped but has started again in "crisis"  Substance Use Topics  . Alcohol use: Yes    Alcohol/week: 2.0 standard drinks    Types: 2 Glasses of wine per week  . Drug use: No    Ms.Dolle reports that she quit smoking about 6 years ago. Her smoking use included cigarettes. She has a 75.00 pack-year smoking history. She has never used smokeless tobacco. She reports current alcohol use of about 2.0 standard drinks of alcohol per week. She reports that she does not use drugs.  Tobacco Cessation: Former smoker quit 2013 with a 75 pack year smoking history  Past surgical hx, Family hx, Social hx all reviewed.  Current Outpatient Medications on File Prior to Visit  Medication Sig  . atorvastatin (LIPITOR) 10 MG tablet Take 1 tablet (10 mg total) by mouth daily.  . Coenzyme Q10 (CO Q-10) 100 MG CAPS Take 300 mg by mouth daily. (Patient taking differently: Take 400 mg by mouth daily. )  . Fluticasone-Salmeterol (ADVAIR) 250-50 MCG/DOSE AEPB Inhale 1 puff into  the lungs every 12 (twelve) hours.  . metroNIDAZOLE (METROGEL) 1 % gel Apply topically daily.  . VENTOLIN HFA 108 (90 Base) MCG/ACT inhaler TAKE 2 PUFFS BY MOUTH EVERY 6 HOURS AS NEEDED FOR WHEEZE OR SHORTNESS OF BREATH  . Cholecalciferol (VITAMIN D3) 1.25 MG (50000 UT) CAPS TAKE 1 CAPSULE BY MOUTH ONE TIME PER WEEK (Patient not taking: Reported on 03/05/2018)   No current facility-administered medications on file prior to visit.      Allergies  Allergen Reactions  . Varenicline Tartrate     REACTION: intolerant  . Penicillins     REACTION: As a child Vague rash only    Review Of Systems:  Constitutional:   No  weight loss, night sweats,  Fevers, chills, fatigue, or  lassitude.  HEENT:   No headaches,  Difficulty swallowing,  Tooth/dental problems, or  Sore throat,                No sneezing, itching, ear ache, nasal congestion, post nasal drip,   CV:  No chest pain,  Orthopnea, PND, swelling in lower extremities, anasarca, dizziness, palpitations, syncope.   GI  No heartburn, indigestion, abdominal pain, nausea, vomiting, diarrhea, change in bowel habits, loss of appetite, bloody stools.   Resp: + shortness of breath with exertion less at rest.  No excess mucus, no productive cough,  No non-productive cough,  No coughing up of blood.  No change in color of mucus.  No wheezing.  No chest wall deformity  Skin: no rash or lesions.  GU: no dysuria, change in color of urine, no urgency or frequency.  No flank pain, no hematuria   MS:  No joint pain or swelling.  No decreased range of motion.  No back pain.  Psych:  No change in mood or affect. No depression or anxiety.  No memory loss.   Vital Signs BP 126/76 (BP Location: Left Arm, Cuff Size: Large)   Pulse 71   LMP  (LMP Unknown)   SpO2 95%    Physical Exam:  General- No distress,  A&Ox3, pleasant ENT: No sinus tenderness, TM clear, pale nasal mucosa, no oral exudate,no post nasal drip, no LAN Cardiac: S1, S2, regular  rate and rhythm, no murmur Chest: No wheeze/ rales/ dullness; no accessory muscle use, no nasal flaring, no sternal retractions Abd.: Soft Non-tender, ND, BS + Ext: No clubbing cyanosis, edema Neuro:  normal strength, MAE x 4, A&O x 3 Skin: No rashes, warm and dry Psych: normal mood and behavior   Assessment/Plan  Pulmonary nodule Pulmonary Nodule per anterior aspect of the right upper lobe enlarged from 5.6 mm to 9.0 mm in the last 13 months per Serial CT screenings. Plan We will schedule a PET scan and Pulmonary Function Tests. We will call you with the results of the PET scan. Continue using your advair 1 puff in the morning and 1 puff in the evening( Maintenance inhaler) Rinse mouth after use Use your Albuterol inhaler as rescue for breakthrough shortness of breath or wheezing.  Follow up after PET as needed.  Follow up with cardiology as we discussed. Please contact  office for sooner follow up if symptoms do not improve or worsen or seek emergency care      Magdalen Spatz, NP 03/05/2018  5:32 PM

## 2018-03-05 NOTE — Assessment & Plan Note (Signed)
Pulmonary Nodule per anterior aspect of the right upper lobe enlarged from 5.6 mm to 9.0 mm in the last 13 months per Serial CT screenings. Plan We will schedule a PET scan and Pulmonary Function Tests. We will call you with the results of the PET scan. Continue using your advair 1 puff in the morning and 1 puff in the evening( Maintenance inhaler) Rinse mouth after use Use your Albuterol inhaler as rescue for breakthrough shortness of breath or wheezing.  Follow up after PET as needed.  Follow up with cardiology as we discussed. Please contact office for sooner follow up if symptoms do not improve or worsen or seek emergency care

## 2018-03-06 ENCOUNTER — Ambulatory Visit (INDEPENDENT_AMBULATORY_CARE_PROVIDER_SITE_OTHER): Payer: Medicare HMO | Admitting: Internal Medicine

## 2018-03-06 DIAGNOSIS — J449 Chronic obstructive pulmonary disease, unspecified: Secondary | ICD-10-CM | POA: Diagnosis not present

## 2018-03-06 LAB — PULMONARY FUNCTION TEST
DL/VA % pred: 125 %
DL/VA: 5.06 ml/min/mmHg/L
DLCO UNC % PRED: 143 %
DLCO unc: 21.31 ml/min/mmHg
FEF 25-75 POST: 0.92 L/s
FEF 25-75 PRE: 0.6 L/s
FEF2575-%CHANGE-POST: 51 %
FEF2575-%PRED-POST: 64 %
FEF2575-%PRED-PRE: 42 %
FEV1-%Change-Post: 14 %
FEV1-%Pred-Post: 58 %
FEV1-%Pred-Pre: 50 %
FEV1-Post: 1.43 L
FEV1-Pre: 1.25 L
FEV1FVC-%CHANGE-POST: 2 %
FEV1FVC-%PRED-PRE: 78 %
FEV6-%CHANGE-POST: 11 %
FEV6-%Pred-Post: 74 %
FEV6-%Pred-Pre: 67 %
FEV6-Post: 2.31 L
FEV6-Pre: 2.08 L
FEV6FVC-%Change-Post: 0 %
FEV6FVC-%Pred-Post: 102 %
FEV6FVC-%Pred-Pre: 102 %
FVC-%CHANGE-POST: 12 %
FVC-%PRED-POST: 111 %
FVC-%Pred-Pre: 65 %
FVC-Post: 2.36 L
FVC-Pre: 2.11 L
POST FEV1/FVC RATIO: 60 %
PRE FEV1/FVC RATIO: 59 %
Post FEV6/FVC ratio: 98 %
Pre FEV6/FVC Ratio: 98 %
RV % pred: 144 %
RV: 3.39 L
TLC % pred: 105 %
TLC: 5.75 L

## 2018-03-06 NOTE — Progress Notes (Signed)
Patient completed full PFT.

## 2018-03-09 ENCOUNTER — Ambulatory Visit (HOSPITAL_COMMUNITY)
Admission: RE | Admit: 2018-03-09 | Discharge: 2018-03-09 | Disposition: A | Discharge status: Home / Self Care | Payer: Medicare HMO | Source: Ambulatory Visit | Attending: Acute Care | Admitting: Acute Care

## 2018-03-09 ENCOUNTER — Ambulatory Visit: Payer: Medicare HMO

## 2018-03-09 DIAGNOSIS — R911 Solitary pulmonary nodule: Secondary | ICD-10-CM | POA: Insufficient documentation

## 2018-03-09 LAB — GLUCOSE, CAPILLARY: Glucose-Capillary: 104 mg/dL — ABNORMAL HIGH (ref 70–99)

## 2018-03-09 MED ORDER — FLUDEOXYGLUCOSE F - 18 (FDG) INJECTION
11.1800 | Freq: Once | INTRAVENOUS | Status: AC | PRN
  Administered 2018-03-09: 11.18 via INTRAVENOUS

## 2018-03-12 ENCOUNTER — Telehealth: Payer: Self-pay | Admitting: Acute Care

## 2018-03-12 NOTE — Telephone Encounter (Signed)
Pt is requesting PET scan results from 03/09/18.  SG please advise. Thanks

## 2018-03-13 ENCOUNTER — Ambulatory Visit: Payer: Medicare HMO | Admitting: Cardiology

## 2018-03-13 NOTE — Telephone Encounter (Signed)
These results have been called to the patient. She is scheduled to see Dr. 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

## 2018-03-15 ENCOUNTER — Encounter: Payer: Self-pay | Admitting: Pulmonary Disease

## 2018-03-15 ENCOUNTER — Ambulatory Visit: Payer: Medicare HMO | Admitting: Pulmonary Disease

## 2018-03-15 VITALS — BP 146/68 | HR 78 | Ht 66.5 in

## 2018-03-15 DIAGNOSIS — Z87891 Personal history of nicotine dependence: Secondary | ICD-10-CM

## 2018-03-15 DIAGNOSIS — R942 Abnormal results of pulmonary function studies: Secondary | ICD-10-CM

## 2018-03-15 DIAGNOSIS — R911 Solitary pulmonary nodule: Secondary | ICD-10-CM

## 2018-03-15 NOTE — Patient Instructions (Addendum)
Thank you for visiting Dr. Valeta Harms at [facility]. Today we recommend the following: Orders Placed This Encounter  Procedures  . Ambulatory referral to Cardiothoracic Surgery    Return in about 6 weeks (around 04/26/2018).

## 2018-03-15 NOTE — Progress Notes (Signed)
.  ava

## 2018-03-15 NOTE — Progress Notes (Signed)
Synopsis: Referred in January 2020 for right upper lobe PET avid lung nodule by Jinny Sanders, MD  Subjective:   PATIENT ID: Hannah Green: female DOB: Apr 23, 1945, MRN: [id number]  Chief Complaint  Patient presents with  . Consult    Consult for abnormal Chest CT. States she has been SOB for the last 6 months.    This is a 73 year old female with a past medical history of breast cancer diagnosed in 1999 status post resection and radiation.  Patient has a longstanding history of smoking, 50 pack years.  2 years ago the patient was enrolled in a lung cancer screening program.  Her first screening revealed a small right upper lobe nodule.  She ultimately had one-year follow-up screening that was completed in January 2020.  This revealed enlargement of the upper lobe nodule that is subpleural in nature.  She underwent a PET scan which revealed an SUV of 7.  Patient was referred to me to discuss biopsy or obtaining tissue sampling due to concern for malignancy.  Patient today has no complaints.  She recently had pulmonary function test completed which revealed an FEV1 postbronchodilator of 1.43 L which is 90% predicted.  Her DLCO was 143%.  She currently uses inhalers as needed for shortness of breath and wheezing.  Of note there was two-vessel calcified coronary disease noted on her LD CT.  She does have gastroesophageal reflux and colon polyps.  She is scheduled for an elective EGD colonoscopy for next week.   Past Medical History:  Diagnosis Date  . Cataract 2017   resolved with surgery  . GERD (gastroesophageal reflux disease)   . Hyperlipidemia   . Rheumatoid arthritis(714.0)      Family History  Problem Relation Age of Onset  . Cancer Mother        breast  . Cancer Father        lung cancer  . Cancer Sister        throat cancer  . Diabetes Paternal Uncle   . Diabetes Maternal Grandmother      Past Surgical History:  Procedure Laterality Date  . BREAST LUMPECTOMY   1999   left, needed radiation  . CATARACT EXTRACTION W/ INTRAOCULAR LENS  IMPLANT, BILATERAL Bilateral 05/19/15, 06/02/15  . CHOLECYSTECTOMY    . NM MYOVIEW LTD  01/2016   LOW RISK. Hypertensive response to exercise followed by significant drop (stage I pressure 211/70 drop to 118/75. Stage II). Apical artifact but otherwise no ischemia or infarction. EF 64%.  . TONSILLECTOMY    . TRANSTHORACIC ECHOCARDIOGRAM  01/12/2016   Mild LVH. EF 60-65%. GR 2-DD    Social History   Socioeconomic History  . Marital status: Divorced    Spouse name: Not on file  . Number of children: 2  . Years of education: Not on file  . Highest education level: Not on file  Occupational History  . Occupation: Retired Freight forwarder    Comment: Patent examiner Needs  . Financial resource strain: Not on file  . Food insecurity:    Worry: Not on file    Inability: Not on file  . Transportation needs:    Medical: Not on file    Non-medical: Not on file  Tobacco Use  . Smoking status: Former Smoker    Packs/day: 1.50    Years: 50.00    Pack years: 75.00    Types: Cigarettes    Last attempt to quit: 04/01/2011    Years since quitting:  6.9  . Smokeless tobacco: Never Used  . Tobacco comment: has stopped but has started again in "crisis"  Substance and Sexual Activity  . Alcohol use: Yes    Alcohol/week: 2.0 standard drinks    Types: 2 Glasses of wine per week  . Drug use: No  . Sexual activity: Never  Lifestyle  . Physical activity:    Days per week: Not on file    Minutes per session: Not on file  . Stress: Not on file  Relationships  . Social connections:    Talks on phone: Not on file    Gets together: Not on file    Attends religious service: Not on file    Active member of club or organization: Not on file    Attends meetings of clubs or organizations: Not on file    Relationship status: Not on file  . Intimate partner violence:    Fear of current or ex partner: Not on file    Emotionally  abused: Not on file    Physically abused: Not on file    Forced sexual activity: Not on file  Other Topics Concern  . Not on file  Social History Narrative   Lives alone   No living will   Requests friend Porfirio Mylar to make health care decisions.   Would accept resuscitation but no prolonged ventilation   Would not want tube feeds if cognitively unaware     Allergies  Allergen Reactions  . Varenicline Tartrate     REACTION: intolerant  . Penicillins     REACTION: As a child Vague rash only     Outpatient Medications Prior to Visit  Medication Sig Dispense Refill  . atorvastatin (LIPITOR) 10 MG tablet Take 1 tablet (10 mg total) by mouth daily. 90 tablet 3  . Cholecalciferol (VITAMIN D3) 1.25 MG (50000 UT) CAPS TAKE 1 CAPSULE BY MOUTH ONE TIME PER WEEK 12 capsule 0  . Coenzyme Q10 (CO Q-10) 100 MG CAPS Take 300 mg by mouth daily. (Patient taking differently: Take 400 mg by mouth daily. ) 90 each 3  . Fluticasone-Salmeterol (ADVAIR) 250-50 MCG/DOSE AEPB Inhale 1 puff into the lungs every 12 (twelve) hours. 60 each 11  . metroNIDAZOLE (METROGEL) 1 % gel Apply topically daily. 30 g 1  . VENTOLIN HFA 108 (90 Base) MCG/ACT inhaler TAKE 2 PUFFS BY MOUTH EVERY 6 HOURS AS NEEDED FOR WHEEZE OR SHORTNESS OF BREATH 18 Inhaler 3   No facility-administered medications prior to visit.     Review of Systems  Constitutional: Negative for chills, fever, malaise/fatigue and weight loss.  HENT: Negative for hearing loss, sore throat and tinnitus.   Eyes: Negative for blurred vision and double vision.  Respiratory: Negative for cough, hemoptysis, sputum production, shortness of breath, wheezing and stridor.   Cardiovascular: Negative for chest pain, palpitations, orthopnea, leg swelling and PND.  Gastrointestinal: Negative for abdominal pain, constipation, diarrhea, heartburn, nausea and vomiting.  Genitourinary: Negative for dysuria, hematuria and urgency.  Musculoskeletal: Negative for joint  pain and myalgias.  Skin: Negative for itching and rash.  Neurological: Negative for dizziness, tingling, weakness and headaches.  Endo/Heme/Allergies: Negative for environmental allergies. Does not bruise/bleed easily.  Psychiatric/Behavioral: Negative for depression. The patient is not nervous/anxious and does not have insomnia.   All other systems reviewed and are negative.    Objective:  Physical Exam Vitals signs reviewed.  Constitutional:      General: She is not in acute distress.    Appearance:  She is well-developed.  HENT:     Head: Normocephalic and atraumatic.  Eyes:     General: No scleral icterus.    Conjunctiva/sclera: Conjunctivae normal.     Pupils: Pupils are equal, round, and reactive to light.  Neck:     Musculoskeletal: Neck supple.     Vascular: No JVD.     Trachea: No tracheal deviation.  Cardiovascular:     Rate and Rhythm: Normal rate and regular rhythm.     Heart sounds: Normal heart sounds. No murmur.  Pulmonary:     Effort: Pulmonary effort is normal. No tachypnea, accessory muscle usage or respiratory distress.     Breath sounds: Normal breath sounds. No stridor. No wheezing, rhonchi or rales.  Abdominal:     General: Bowel sounds are normal. There is no distension.     Palpations: Abdomen is soft.     Tenderness: There is no abdominal tenderness.  Musculoskeletal:        General: No tenderness.  Lymphadenopathy:     Cervical: No cervical adenopathy.  Skin:    General: Skin is warm and dry.     Capillary Refill: Capillary refill takes less than 2 seconds.     Findings: No rash.  Neurological:     Mental Status: She is alert and oriented to person, place, and time.  Psychiatric:        Behavior: Behavior normal.      Vitals:   03/15/18 1350  BP: (!) 146/68  Pulse: 78  SpO2: 97%  Height: 5' 6.5" (1.689 m)   97% on RA BMI Readings from Last 3 Encounters:  03/15/18 35.53 kg/m  02/14/18 36.63 kg/m  12/12/17 37.45 kg/m   Wt  Readings from Last 3 Encounters:  02/14/18 223 lb 8 oz (101.4 kg)  12/12/17 228 lb 8 oz (103.6 kg)  12/05/17 222 lb 8 oz (100.9 kg)     CBC    Component Value Date/Time   WBC 8.9 11/24/2015 1128   RBC 4.66 11/24/2015 1128   HGB 14.6 11/24/2015 1128   HCT 43.0 11/24/2015 1128   PLT 327.0 11/24/2015 1128   MCV 92.1 11/24/2015 1128   MCH 30.6 06/01/2012 1452   MCHC 34.0 11/24/2015 1128   RDW 13.2 11/24/2015 1128   LYMPHSABS 2.7 11/24/2015 1128   MONOABS 0.6 11/24/2015 1128   EOSABS 0.2 11/24/2015 1128   BASOSABS 0.0 11/24/2015 1128    Chest Imaging: Lung cancer screening images 02/01/2017, 03/01/2018: Right upper lobe lung nodule increased in size from 2018- thousand 20  03/09/2018 nuclear medicine pet imaging: PET avid right upper lobe nodule, SUV 7.  Pulmonary Functions Testing Results: PFT Results Latest Ref Rng & Units 03/06/2018  FVC-Pre L 2.11  FVC-Predicted Pre % 99  FVC-Post L 2.36  FVC-Predicted Post % 111  Pre FEV1/FVC % % 59  Post FEV1/FCV % % 60  FEV1-Pre L 1.25  FEV1-Predicted Pre % 79  FEV1-Post L 1.43  DLCO UNC% % 143  DLCO COR %Predicted % 136  TLC L 5.75  TLC % Predicted % 143  RV % Predicted % 180    FeNO: None   Pathology: None   Echocardiogram:   Study Conclusions  - Left ventricle: The cavity size was normal. Wall thickness was   increased in a pattern of mild LVH. Systolic function was normal.   The estimated ejection fraction was in the range of 60% to 65%.   Wall motion was normal; there were no regional wall motion  abnormalities. Features are consistent with a pseudonormal left   ventricular filling pattern, with concomitant abnormal relaxation   and increased filling pressure (grade 2 diastolic dysfunction).  Heart Catheterization: None     Assessment & Plan:   Lung nodule - Plan: Ambulatory referral to Cardiothoracic Surgery  Abnormal PET of right lung  Nodule of upper lobe of right lung  Former  smoker  Discussion: This is a 73 year old female with mild COPD FEV1 postbronchodilator 90% predicted.  With a PET avid right upper lobe lung nodule has enlarged in size.  Currently 9 mm subpleural in location.  Today we discussed the risks, benefits and alternatives of proceeding with percutaneous biopsy versus referral to cardiothoracic surgery to consider resection.  After we discussed these benefits the patient has elected for referral to cardiothoracic surgery.  I have placed a referral to Dr. Merilynn Finland to consider the patient for surgery.  She does have a prior history of breast cancer however this was in 1999 and of the upper lobe lung nodule has been slowly growing in size since initial low-dose cancer screening CT in 2018.  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  Patient to continue as needed albuterol for shortness of breath and wheezing.  She can return to clinic in 6 weeks.  Greater than 50% of this patient's 60-minute office visit was spent face-to-face counseling on the above recommendations and treatment plan regarding risks, benefits and alternatives of percutaneous needle biopsy, navigational bronchoscopy versus cardiothoracic surgery referral for resection.   Current Outpatient Medications:  .  atorvastatin (LIPITOR) 10 MG tablet, Take 1 tablet (10 mg total) by mouth daily., Disp: 90 tablet, Rfl: 3 .  Cholecalciferol (VITAMIN D3) 1.25 MG (50000 UT) CAPS, TAKE 1 CAPSULE BY MOUTH ONE TIME PER WEEK, Disp: 12 capsule, Rfl: 0 .  Coenzyme Q10 (CO Q-10) 100 MG CAPS, Take 300 mg by mouth daily. (Patient taking differently: Take 400 mg by mouth daily. ), Disp: 90 each, Rfl: 3 .  Fluticasone-Salmeterol (ADVAIR) 250-50 MCG/DOSE AEPB, Inhale 1 puff into the lungs every 12 (twelve) hours., Disp: 60 each, Rfl: 11 .  metroNIDAZOLE (METROGEL) 1 % gel, Apply topically daily., Disp: 30 g, Rfl: 1 .  VENTOLIN HFA 108 (90 Base) MCG/ACT inhaler, TAKE 2 PUFFS BY MOUTH  EVERY 6 HOURS AS NEEDED FOR WHEEZE OR SHORTNESS OF BREATH, Disp: 18 Inhaler, Rfl: 3   Garner Nash, DO [facility] 03/15/2018 2:38 PM

## 2018-03-29 ENCOUNTER — Encounter: Payer: Self-pay | Admitting: Thoracic Surgery (Cardiothoracic Vascular Surgery)

## 2018-03-29 ENCOUNTER — Institutional Professional Consult (permissible substitution): Payer: Medicare HMO | Admitting: Thoracic Surgery (Cardiothoracic Vascular Surgery)

## 2018-03-29 VITALS — BP 147/80 | HR 77 | Resp 20 | Ht 66.5 in | Wt 220.0 lb

## 2018-03-29 DIAGNOSIS — R911 Solitary pulmonary nodule: Secondary | ICD-10-CM

## 2018-03-29 NOTE — Patient Instructions (Signed)
Video-Assisted Thoracic Surgery Video-assisted thoracic surgery (VATS) is a procedure that lets your doctor look inside your chest and do small procedures. Your thoracic area is between your neck and belly. VATS may be done to:  Study or find chest problems.  Take out a sample of tissue for a test (biopsy).  Put medicines in your chest.  Take out fluid, pus, or blood.  Take out lumps (tumors).  Take out part of a lung (lobectomy).  Treat some backbone (spine) problems. VATS is done by putting a thin tube (thoracoscope) in your chest. The tube has a light and camera on the end. The tube sends pictures to a TV screen. This lets your doctor see inside your chest. What happens before the procedure? Medicines  Ask your doctor about: ? Changing or stopping your normal medicines. This is important if you take diabetes medicines or blood thinners. ? Taking medicines such as aspirin and ibuprofen. These medicines can thin your blood. Do not take these medicines before your procedure if your doctor tells you not to.  You may be given antibiotic medicine. Staying hydrated Follow instructions from your doctor about hydration. Instructions may include:  Up to 2 hours before the procedure - you may continue to drink clear liquids. These include water, clear fruit juice, black coffee, and plain tea. Eating and drinking restrictions Follow instructions from your doctor about eating and drinking. Instructions may include:  8 hours before the procedure - stop eating heavy meals or foods. This includes meat, fried foods, or fatty foods.  6 hours before the procedure - stop eating light meals or foods. This includes toast or cereal.  6 hours before the procedure - stop drinking milk or drinks that contain milk.  2 hours before the procedure - stop drinking clear liquids. General instructions  Ask how your surgical site will be marked or identified.  You may be asked to shower with a  germ-killing soap.  You may have a blood or pee (urine) sample taken.  You may have tests, such as: ? Chest X-ray. ? Electrocardiogram (ECG). ? Ultrasound. ? CT scan.  Do not use any products that have nicotine or tobacco. These include cigarettes and e-cigarettes. If you need help quitting, ask your doctor.  Plan to have someone take you home.  If you will be going home right after the procedure, plan to have someone with you for 24 hours. What happens during the procedure?  To lower your risk of infection: ? Your health care team will wash or sanitize their hands. ? Your skin will be washed with soap.  An IV tube will be placed into one of your veins.  You may be given: ? A medicine to make you fall asleep (general anesthetic). ? A medicine to help you relax (sedative). ? A medicine that is injected into an area of your body to numb everything below the injection site (regional anesthetic).  A thin tube (catheter) will be placed into your bladder and in your tube that carries pee (urine) out of your body. The catheter will drain your pee.  1-4 small cuts will be made in your chest.  A tube with a camera on it will be placed into a cut. This helps your surgeon see inside your chest.  One of your lungs will have air taken out of it (will be deflated). This helps your surgeon see the area.  Any procedures that you need will be done.  Air will be put back into  your lung (it will be inflated).  A chest tube may be placed through a cut. This drains fluid.  Other cuts will be closed with stitches (sutures) or staples.  A bandage (dressing) may be placed over your cut(s). The procedure may vary. What happens after the procedure?  You will be monitored until your medicines have worn off.  You may have a chest tube. It will be watched for signs of fluid or air buildup.  You may get fluids and medicines through an IV tube.  You may have to wear stockings to help prevent  blood clots and reduce swelling (compression stockings).  Do not drive for 24 hours if you were given a medicine to help you relax. Summary  Video-assisted thoracic surgery (VATS) is a procedure that lets your doctor look inside your chest and do small procedures.  VATS is done to study or find problems in your chest.  After the procedure, you may have a tube draining fluid from your chest. This information is not intended to replace advice given to you by your health care provider. Make sure you discuss any questions you have with your health care provider. Document Released: 06/11/2012 Document Revised: 02/01/2016 Document Reviewed: 02/01/2016 Elsevier Interactive Patient Education  2019 [employer].

## 2018-03-29 NOTE — H&P (View-Only) (Signed)
PCP is Jinny Sanders, MD Referring Provider is Icard, Octavio Graves, DO  Chief Complaint  Patient presents with  . 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  About a year ago she was started in the lung cancer screening program.  Her scan in December 2018 showed a 5 mm right upper lobe subpleural nodule.  She recently had a follow-up scan which showed an increase to approximately 9 to 26mm in size.  A PET/CT was done which showed marked hypermetabolic activity with an SUV of 7.  There was no evidence of regional or distant metastases.  She quit smoking in 2014.  She complains of shortness of breath and tightness in her chest with exertion she says this has been getting worse recently.  She notices it even with walking to her mailbox and back.  She has not had any weight loss and in fact has gained 6 pounds in the past 3 months.  Zubrod Score: At the time of surgery this patient's most appropriate activity status/level should be described as: []     0    Normal activity, no symptoms [x]     1    Restricted in physical strenuous activity but ambulatory, able to do out light work []     2    Ambulatory and capable of self care, unable to do work activities, up and about >50 % of waking hours                              []     3    Only limited self care, in bed greater than 50% of waking hours []     4    Completely disabled, no self care, confined to bed or chair []     5    Moribund  Past Medical History:  Diagnosis Date  . Breast cancer ([place]) 1999  . Cataract 2017   resolved with surgery  . GERD (gastroesophageal reflux disease)   .  Hyperlipidemia   . Rheumatoid arthritis(714.0)     Past Surgical History:  Procedure Laterality Date  . BREAST LUMPECTOMY  1999   left, needed radiation  . CATARACT EXTRACTION W/ INTRAOCULAR LENS  IMPLANT, BILATERAL Bilateral 05/19/15, 06/02/15  . CHOLECYSTECTOMY    . NM MYOVIEW LTD  01/2016   LOW RISK. Hypertensive response to exercise followed by significant drop (stage I pressure 211/70 drop to 118/75. Stage II). Apical artifact but otherwise no ischemia or infarction. EF 64%.  . TONSILLECTOMY    . TRANSTHORACIC ECHOCARDIOGRAM  01/12/2016   Mild LVH. EF 60-65%. GR 2-DD    Family History  Problem Relation Age of Onset  . Cancer Mother        breast  . Cancer Father        lung cancer  . Cancer Sister        throat cancer  . Diabetes Paternal Uncle   . Diabetes Maternal Grandmother     Social History Social History   Tobacco Use  . Smoking status: Former Smoker    Packs/day: 1.50    Years: 50.00  Pack years: 75.00    Types: Cigarettes    Last attempt to quit: 04/01/2011    Years since quitting: 6.9  . Smokeless tobacco: Never Used  . Tobacco comment: has stopped but has started again in "crisis"  Substance Use Topics  . Alcohol use: Yes    Alcohol/week: 2.0 standard drinks    Types: 2 Glasses of wine per week  . Drug use: No    Current Outpatient Medications  Medication Sig Dispense Refill  . atorvastatin (LIPITOR) 10 MG tablet Take 1 tablet (10 mg total) by mouth daily. 90 tablet 3  . Cholecalciferol (VITAMIN D3) 1.25 MG (50000 UT) CAPS TAKE 1 CAPSULE BY MOUTH ONE TIME PER WEEK 12 capsule 0  . Coenzyme Q10 (CO Q-10) 100 MG CAPS Take 300 mg by mouth daily. (Patient taking differently: Take 400 mg by mouth daily. ) 90 each 3  . Fluticasone-Salmeterol (ADVAIR) 250-50 MCG/DOSE AEPB Inhale 1 puff into the lungs every 12 (twelve) hours. 60 each 11  . metroNIDAZOLE (METROGEL) 1 % gel Apply topically daily. 30 g 1  . VENTOLIN HFA 108 (90 Base) MCG/ACT inhaler TAKE 2  PUFFS BY MOUTH EVERY 6 HOURS AS NEEDED FOR WHEEZE OR SHORTNESS OF BREATH 18 Inhaler 3   No current facility-administered medications for this visit.     Allergies  Allergen Reactions  . Varenicline Tartrate     REACTION: intolerant  . Penicillins     REACTION: As a child Vague rash only    Review of Systems  Constitutional: Positive for fatigue and unexpected weight change (Gained 6 pounds in 3 months).  HENT: Positive for hearing loss and trouble swallowing. Negative for voice change.   Eyes: Negative for visual disturbance.  Respiratory: Positive for shortness of breath and wheezing.   Cardiovascular: Positive for chest pain, palpitations and leg swelling.  Gastrointestinal: Positive for abdominal pain (Reflux), constipation and diarrhea.  Genitourinary: Negative for difficulty urinating and dysuria.  Musculoskeletal: Positive for arthralgias.       Leg cramps, pain in feet when lying flat  Neurological: Negative for seizures, syncope, weakness and headaches.  Hematological: Negative for adenopathy. Does not bruise/bleed easily.    BP (!) 147/80   Pulse 77   Resp 20   Ht 5' 6.5" (1.689 m)   Wt 220 lb (99.8 kg)   LMP  (LMP Unknown)   SpO2 96% Comment: RA  BMI 34.98 kg/m  Physical Exam Vitals signs reviewed.  Constitutional:      General: She is not in acute distress.    Appearance: She is obese.  HENT:     Head: Normocephalic and atraumatic.  Eyes:     General: No scleral icterus.    Extraocular Movements: Extraocular movements intact.     Pupils: Pupils are equal, round, and reactive to light.  Neck:     Musculoskeletal: Neck supple.     Vascular: No carotid bruit.  Cardiovascular:     Rate and Rhythm: Normal rate and regular rhythm.     Heart sounds: No murmur. No gallop.   Pulmonary:     Effort: Pulmonary effort is normal. No respiratory distress.     Breath sounds: No wheezing or rales.  Abdominal:     General: There is no distension.     Palpations:  Abdomen is soft.     Tenderness: There is no abdominal tenderness.  Musculoskeletal:     Right lower leg: No edema.     Left lower leg: No edema.  Lymphadenopathy:     Cervical: No cervical adenopathy.  Skin:    General: Skin is warm and dry.  Neurological:     General: No focal deficit present.     Mental Status: She is alert.     Cranial Nerves: No cranial nerve deficit.     Motor: No weakness.     Coordination: Coordination normal.    Diagnostic Tests: CT CHEST WITHOUT CONTRAST LOW-DOSE FOR LUNG CANCER SCREENING  TECHNIQUE: Multidetector CT imaging of the chest was performed following the standard protocol without IV contrast.  COMPARISON:  Low-dose lung cancer screening chest CT 02/01/2017.  FINDINGS: Cardiovascular: Heart size is normal. There is no significant pericardial fluid, thickening or pericardial calcification. There is aortic atherosclerosis, as well as atherosclerosis of the great vessels of the mediastinum and the coronary arteries, including calcified atherosclerotic plaque in the left anterior descending and right coronary arteries.  Mediastinum/Nodes: No pathologically enlarged mediastinal or hilar lymph nodes. Moderate-sized hiatal hernia. No axillary lymphadenopathy. Surgical clips in the right axillary region from prior lymph node dissection.  Lungs/Pleura: Multiple small pulmonary nodules are noted in the lungs bilaterally. Although several of these are stable compared to the prior study, one has clearly enlarged. Specifically, in the anterior aspect of the right upper lobe immediately above the minor fissure (axial image 133 of series 3), with a volume derived mean diameter 9.0 mm. No acute consolidative airspace disease. No pleural effusions. Diffuse bronchial wall thickening with mild centrilobular and mild-to-moderate paraseptal emphysema.  Upper Abdomen: Aortic atherosclerosis.  Musculoskeletal: There are no aggressive appearing  lytic or blastic lesions noted in the visualized portions of the skeleton.  IMPRESSION: 1. Lung-RADS 4BS, suspicious. Additional imaging evaluation or consultation with Pulmonology or Thoracic Surgery recommended. 2. The "S" modifier above refers to potentially clinically significant non lung cancer related findings. Specifically, Aortic atherosclerosis, in addition to 2 vessel coronary artery disease. Assessment for potential risk factor modification, dietary therapy or pharmacologic therapy may be warranted, if clinically indicated. 3. Mild diffuse bronchial wall thickening with mild centrilobular and mild-to-moderate paraseptal emphysema; imaging findings suggestive of underlying COPD.  Aortic Atherosclerosis (ICD10-I70.0) and Emphysema (ICD10-J43.9).   Electronically Signed   By: Vinnie Langton M.D.   On: 03/01/2018 11:30 NUCLEAR MEDICINE PET SKULL BASE TO THIGH  TECHNIQUE: 11.2 mCi F-18 FDG was injected intravenously. Full-ring PET imaging was performed from the skull base to thigh after the radiotracer. CT data was obtained and used for attenuation correction and anatomic localization.  Fasting blood glucose: 104 mg/dl  COMPARISON:  Lung cancer screening CT 03/01/2018  FINDINGS: Mediastinal blood pool activity: SUV max 2.9  NECK: No hypermetabolic lymph nodes in the neck.  Incidental CT findings: none  CHEST: The anterior right upper lobe nodule seen to be enlarging on recent lung cancer screening CT is markedly hypermetabolic with SUV max = 7. Given this level of hypermetabolism relative to the small size of the nodule, features are highly suspicious for malignancy. No evidence for hypermetabolic lymphadenopathy in the mediastinum or either hilum above background blood pool uptake. No mediastinal or hilar lymphadenopathy by CT size criteria.  No other hypermetabolic pulmonary nodule evident.  Incidental CT findings: Changes of emphysema noted in  the lungs bilaterally. The multiple small pulmonary nodule seen on recent screening CT are less well demonstrated on today's non breath hold exam.  ABDOMEN/PELVIS: No abnormal hypermetabolic activity within the liver, pancreas, adrenal glands, or spleen. No hypermetabolic lymph nodes in the abdomen or pelvis.  Incidental CT findings:  Small to moderate hiatal hernia. Gallbladder surgically absent. There is abdominal aortic atherosclerosis without aneurysm. Diffuse diverticular changes are noted in the colon without diverticulitis.  SKELETON: No focal hypermetabolic activity to suggest skeletal metastasis.  Incidental CT findings: none  IMPRESSION: 9 mm anterior right upper lobe pulmonary nodule identified on recent lung cancer screening CT is markedly hypermetabolic, consistent with neoplasm. No evidence for hypermetabolic lymphadenopathy in the hilar regions or mediastinum.  No other suspicious hypermetabolic disease identified in the neck, chest, abdomen, or pelvis.   Electronically Signed   By: Misty Stanley M.D.   On: 03/09/2018 19:01 I personally reviewed the CT and PET/CT images and concur with the findings noted above  Pulmonary function testing FVC 2.11 (99%) FEV1 1.25 (79%) FEV1 1.43 (90%) postbronchodilator TLC 5.75 (143%) RV 3.39 (180%) DLCO 21.31 (143%) Reviewing the pulmonary function results.  She was listed as 57 inches tall when in fact she is approximately 67 inches tall.  That throws off the percentages, but she still has adequate function to tolerate lobectomy based on the absolute numbers.  Impression: Hannah Green is a 73 year old woman with a history of tobacco abuse, COPD, breast cancer, gastroesophageal reflux, vitamin D deficiency, rheumatoid arthritis, and diastolic cardiac dysfunction.  She started in the lung cancer screening program about a year ago.  She was found to have a 5 mm right upper lobe nodule at that time.  On follow-up a  year later the nodule had essentially doubled in size.  It is markedly hypermetabolic on PET/CT with an SUV of 7.  This most likely is a new primary bronchogenic carcinoma and has to be treated as such unless it can be proven otherwise.  Infectious and inflammatory nodules are also in the differential diagnosis, but are far less likely given her age, smoking history, appearance of the lesion, and the degree of activity on PET CT.  I think the best option for her would be to proceed directly to surgery.  This is not amenable to bronchoscopic biopsy.  CT-guided biopsy could be attempted but a negative result would not eliminate the need for definitive diagnosis with surgical resection.  I had a long discussion with Hannah Green and her son.  I reviewed the images with them I recommended that we proceed with right VATS for wedge resection and possible right upper lobectomy.  I described the general nature of the procedure to them including the need for general anesthesia, the incisions to be used, the intraoperative decision making based on frozen section, the use of a drainage tube postoperatively, the expected hospital stay, and the overall recovery.  I informed him of the indications, risk, benefits, and alternatives.  They understand the risks include, but are not limited to death, MI, DVT, PE, bleeding, possible need for transfusion, infection, prolonged air leak, cardiac arrhythmias, as well as the possibility of other unforeseeable complications.  I am concerned about her degree of shortness of breath and chest tightness that she experiences with relatively minor exertion.  It seems out of proportion to her pulmonary function testing.  She does have some evidence of coronary calcification on her CT and PET/CT images.  I think she needs cardiology clearance prior to surgery.  Fortunately Eric Form had already arranged for her an appointment with Dr. Ellyn Hack for next week.  Once she is cleared from a  cardiac standpoint she will call and schedule the procedure.  Plan: She has an appointment with Dr. Ellyn Hack next week for cardiology clearance.  Right  VATS for wedge resection, possible upper lobectomy.  Patient will call to schedule after she sees Dr. Ellyn Hack.  Melrose Nakayama, MD [facility] [phone]

## 2018-03-29 NOTE — Progress Notes (Signed)
PCP is Jinny Sanders, MD Referring Provider is Icard, Octavio Graves, DO  Chief Complaint  Patient presents with  . 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  About a year ago she was started in the lung cancer screening program.  Her scan in December 2018 showed a 5 mm right upper lobe subpleural nodule.  She recently had a follow-up scan which showed an increase to approximately 9 to 69mm in size.  A PET/CT was done which showed marked hypermetabolic activity with an SUV of 7.  There was no evidence of regional or distant metastases.  She quit smoking in 2014.  She complains of shortness of breath and tightness in her chest with exertion she says this has been getting worse recently.  She notices it even with walking to her mailbox and back.  She has not had any weight loss and in fact has gained 6 pounds in the past 3 months.  Zubrod Score: At the time of surgery this patient's most appropriate activity status/level should be described as: []     0    Normal activity, no symptoms [x]     1    Restricted in physical strenuous activity but ambulatory, able to do out light work []     2    Ambulatory and capable of self care, unable to do work activities, up and about >50 % of waking hours                              []     3    Only limited self care, in bed greater than 50% of waking hours []     4    Completely disabled, no self care, confined to bed or chair []     5    Moribund  Past Medical History:  Diagnosis Date  . Breast cancer ([place]) 1999  . Cataract 2017   resolved with surgery  . GERD (gastroesophageal reflux disease)   .  Hyperlipidemia   . Rheumatoid arthritis(714.0)     Past Surgical History:  Procedure Laterality Date  . BREAST LUMPECTOMY  1999   left, needed radiation  . CATARACT EXTRACTION W/ INTRAOCULAR LENS  IMPLANT, BILATERAL Bilateral 05/19/15, 06/02/15  . CHOLECYSTECTOMY    . NM MYOVIEW LTD  01/2016   LOW RISK. Hypertensive response to exercise followed by significant drop (stage I pressure 211/70 drop to 118/75. Stage II). Apical artifact but otherwise no ischemia or infarction. EF 64%.  . TONSILLECTOMY    . TRANSTHORACIC ECHOCARDIOGRAM  01/12/2016   Mild LVH. EF 60-65%. GR 2-DD    Family History  Problem Relation Age of Onset  . Cancer Mother        breast  . Cancer Father        lung cancer  . Cancer Sister        throat cancer  . Diabetes Paternal Uncle   . Diabetes Maternal Grandmother     Social History Social History   Tobacco Use  . Smoking status: Former Smoker    Packs/day: 1.50    Years: 50.00  Pack years: 75.00    Types: Cigarettes    Last attempt to quit: 04/01/2011    Years since quitting: 6.9  . Smokeless tobacco: Never Used  . Tobacco comment: has stopped but has started again in "crisis"  Substance Use Topics  . Alcohol use: Yes    Alcohol/week: 2.0 standard drinks    Types: 2 Glasses of wine per week  . Drug use: No    Current Outpatient Medications  Medication Sig Dispense Refill  . atorvastatin (LIPITOR) 10 MG tablet Take 1 tablet (10 mg total) by mouth daily. 90 tablet 3  . Cholecalciferol (VITAMIN D3) 1.25 MG (50000 UT) CAPS TAKE 1 CAPSULE BY MOUTH ONE TIME PER WEEK 12 capsule 0  . Coenzyme Q10 (CO Q-10) 100 MG CAPS Take 300 mg by mouth daily. (Patient taking differently: Take 400 mg by mouth daily. ) 90 each 3  . Fluticasone-Salmeterol (ADVAIR) 250-50 MCG/DOSE AEPB Inhale 1 puff into the lungs every 12 (twelve) hours. 60 each 11  . metroNIDAZOLE (METROGEL) 1 % gel Apply topically daily. 30 g 1  . VENTOLIN HFA 108 (90 Base) MCG/ACT inhaler TAKE 2  PUFFS BY MOUTH EVERY 6 HOURS AS NEEDED FOR WHEEZE OR SHORTNESS OF BREATH 18 Inhaler 3   No current facility-administered medications for this visit.     Allergies  Allergen Reactions  . Varenicline Tartrate     REACTION: intolerant  . Penicillins     REACTION: As a child Vague rash only    Review of Systems  Constitutional: Positive for fatigue and unexpected weight change (Gained 6 pounds in 3 months).  HENT: Positive for hearing loss and trouble swallowing. Negative for voice change.   Eyes: Negative for visual disturbance.  Respiratory: Positive for shortness of breath and wheezing.   Cardiovascular: Positive for chest pain, palpitations and leg swelling.  Gastrointestinal: Positive for abdominal pain (Reflux), constipation and diarrhea.  Genitourinary: Negative for difficulty urinating and dysuria.  Musculoskeletal: Positive for arthralgias.       Leg cramps, pain in feet when lying flat  Neurological: Negative for seizures, syncope, weakness and headaches.  Hematological: Negative for adenopathy. Does not bruise/bleed easily.    BP (!) 147/80   Pulse 77   Resp 20   Ht 5' 6.5" (1.689 m)   Wt 220 lb (99.8 kg)   LMP  (LMP Unknown)   SpO2 96% Comment: RA  BMI 34.98 kg/m  Physical Exam Vitals signs reviewed.  Constitutional:      General: She is not in acute distress.    Appearance: She is obese.  HENT:     Head: Normocephalic and atraumatic.  Eyes:     General: No scleral icterus.    Extraocular Movements: Extraocular movements intact.     Pupils: Pupils are equal, round, and reactive to light.  Neck:     Musculoskeletal: Neck supple.     Vascular: No carotid bruit.  Cardiovascular:     Rate and Rhythm: Normal rate and regular rhythm.     Heart sounds: No murmur. No gallop.   Pulmonary:     Effort: Pulmonary effort is normal. No respiratory distress.     Breath sounds: No wheezing or rales.  Abdominal:     General: There is no distension.     Palpations:  Abdomen is soft.     Tenderness: There is no abdominal tenderness.  Musculoskeletal:     Right lower leg: No edema.     Left lower leg: No edema.  Lymphadenopathy:     Cervical: No cervical adenopathy.  Skin:    General: Skin is warm and dry.  Neurological:     General: No focal deficit present.     Mental Status: She is alert.     Cranial Nerves: No cranial nerve deficit.     Motor: No weakness.     Coordination: Coordination normal.    Diagnostic Tests: CT CHEST WITHOUT CONTRAST LOW-DOSE FOR LUNG CANCER SCREENING  TECHNIQUE: Multidetector CT imaging of the chest was performed following the standard protocol without IV contrast.  COMPARISON:  Low-dose lung cancer screening chest CT 02/01/2017.  FINDINGS: Cardiovascular: Heart size is normal. There is no significant pericardial fluid, thickening or pericardial calcification. There is aortic atherosclerosis, as well as atherosclerosis of the great vessels of the mediastinum and the coronary arteries, including calcified atherosclerotic plaque in the left anterior descending and right coronary arteries.  Mediastinum/Nodes: No pathologically enlarged mediastinal or hilar lymph nodes. Moderate-sized hiatal hernia. No axillary lymphadenopathy. Surgical clips in the right axillary region from prior lymph node dissection.  Lungs/Pleura: Multiple small pulmonary nodules are noted in the lungs bilaterally. Although several of these are stable compared to the prior study, one has clearly enlarged. Specifically, in the anterior aspect of the right upper lobe immediately above the minor fissure (axial image 133 of series 3), with a volume derived mean diameter 9.0 mm. No acute consolidative airspace disease. No pleural effusions. Diffuse bronchial wall thickening with mild centrilobular and mild-to-moderate paraseptal emphysema.  Upper Abdomen: Aortic atherosclerosis.  Musculoskeletal: There are no aggressive appearing  lytic or blastic lesions noted in the visualized portions of the skeleton.  IMPRESSION: 1. Lung-RADS 4BS, suspicious. Additional imaging evaluation or consultation with Pulmonology or Thoracic Surgery recommended. 2. The "S" modifier above refers to potentially clinically significant non lung cancer related findings. Specifically, Aortic atherosclerosis, in addition to 2 vessel coronary artery disease. Assessment for potential risk factor modification, dietary therapy or pharmacologic therapy may be warranted, if clinically indicated. 3. Mild diffuse bronchial wall thickening with mild centrilobular and mild-to-moderate paraseptal emphysema; imaging findings suggestive of underlying COPD.  Aortic Atherosclerosis (ICD10-I70.0) and Emphysema (ICD10-J43.9).   Electronically Signed   By: Vinnie Langton M.D.   On: 03/01/2018 11:30 NUCLEAR MEDICINE PET SKULL BASE TO THIGH  TECHNIQUE: 11.2 mCi F-18 FDG was injected intravenously. Full-ring PET imaging was performed from the skull base to thigh after the radiotracer. CT data was obtained and used for attenuation correction and anatomic localization.  Fasting blood glucose: 104 mg/dl  COMPARISON:  Lung cancer screening CT 03/01/2018  FINDINGS: Mediastinal blood pool activity: SUV max 2.9  NECK: No hypermetabolic lymph nodes in the neck.  Incidental CT findings: none  CHEST: The anterior right upper lobe nodule seen to be enlarging on recent lung cancer screening CT is markedly hypermetabolic with SUV max = 7. Given this level of hypermetabolism relative to the small size of the nodule, features are highly suspicious for malignancy. No evidence for hypermetabolic lymphadenopathy in the mediastinum or either hilum above background blood pool uptake. No mediastinal or hilar lymphadenopathy by CT size criteria.  No other hypermetabolic pulmonary nodule evident.  Incidental CT findings: Changes of emphysema noted in  the lungs bilaterally. The multiple small pulmonary nodule seen on recent screening CT are less well demonstrated on today's non breath hold exam.  ABDOMEN/PELVIS: No abnormal hypermetabolic activity within the liver, pancreas, adrenal glands, or spleen. No hypermetabolic lymph nodes in the abdomen or pelvis.  Incidental CT findings:  Small to moderate hiatal hernia. Gallbladder surgically absent. There is abdominal aortic atherosclerosis without aneurysm. Diffuse diverticular changes are noted in the colon without diverticulitis.  SKELETON: No focal hypermetabolic activity to suggest skeletal metastasis.  Incidental CT findings: none  IMPRESSION: 9 mm anterior right upper lobe pulmonary nodule identified on recent lung cancer screening CT is markedly hypermetabolic, consistent with neoplasm. No evidence for hypermetabolic lymphadenopathy in the hilar regions or mediastinum.  No other suspicious hypermetabolic disease identified in the neck, chest, abdomen, or pelvis.   Electronically Signed   By: Misty Stanley M.D.   On: 03/09/2018 19:01 I personally reviewed the CT and PET/CT images and concur with the findings noted above  Pulmonary function testing FVC 2.11 (99%) FEV1 1.25 (79%) FEV1 1.43 (90%) postbronchodilator TLC 5.75 (143%) RV 3.39 (180%) DLCO 21.31 (143%) Reviewing the pulmonary function results.  She was listed as 57 inches tall when in fact she is approximately 67 inches tall.  That throws off the percentages, but she still has adequate function to tolerate lobectomy based on the absolute numbers.  Impression: Hannah Green is a 73 year old woman with a history of tobacco abuse, COPD, breast cancer, gastroesophageal reflux, vitamin D deficiency, rheumatoid arthritis, and diastolic cardiac dysfunction.  She started in the lung cancer screening program about a year ago.  She was found to have a 5 mm right upper lobe nodule at that time.  On follow-up a  year later the nodule had essentially doubled in size.  It is markedly hypermetabolic on PET/CT with an SUV of 7.  This most likely is a new primary bronchogenic carcinoma and has to be treated as such unless it can be proven otherwise.  Infectious and inflammatory nodules are also in the differential diagnosis, but are far less likely given her age, smoking history, appearance of the lesion, and the degree of activity on PET CT.  I think the best option for her would be to proceed directly to surgery.  This is not amenable to bronchoscopic biopsy.  CT-guided biopsy could be attempted but a negative result would not eliminate the need for definitive diagnosis with surgical resection.  I had a long discussion with Mrs. Woolf and her son.  I reviewed the images with them I recommended that we proceed with right VATS for wedge resection and possible right upper lobectomy.  I described the general nature of the procedure to them including the need for general anesthesia, the incisions to be used, the intraoperative decision making based on frozen section, the use of a drainage tube postoperatively, the expected hospital stay, and the overall recovery.  I informed him of the indications, risk, benefits, and alternatives.  They understand the risks include, but are not limited to death, MI, DVT, PE, bleeding, possible need for transfusion, infection, prolonged air leak, cardiac arrhythmias, as well as the possibility of other unforeseeable complications.  I am concerned about her degree of shortness of breath and chest tightness that she experiences with relatively minor exertion.  It seems out of proportion to her pulmonary function testing.  She does have some evidence of coronary calcification on her CT and PET/CT images.  I think she needs cardiology clearance prior to surgery.  Fortunately Eric Form had already arranged for her an appointment with Dr. Ellyn Hack for next week.  Once she is cleared from a  cardiac standpoint she will call and schedule the procedure.  Plan: She has an appointment with Dr. Ellyn Hack next week for cardiology clearance.  Right  VATS for wedge resection, possible upper lobectomy.  Patient will call to schedule after she sees Dr. Ellyn Hack.  Melrose Nakayama, MD [facility] [phone]

## 2018-03-31 DIAGNOSIS — I4891 Unspecified atrial fibrillation: Secondary | ICD-10-CM

## 2018-03-31 DIAGNOSIS — I9789 Other postprocedural complications and disorders of the circulatory system, not elsewhere classified: Secondary | ICD-10-CM

## 2018-03-31 HISTORY — DX: Other postprocedural complications and disorders of the circulatory system, not elsewhere classified: I97.89

## 2018-03-31 HISTORY — DX: Unspecified atrial fibrillation: I48.91

## 2018-04-04 ENCOUNTER — Encounter: Payer: Self-pay | Admitting: Cardiology

## 2018-04-04 ENCOUNTER — Ambulatory Visit: Payer: Medicare HMO | Admitting: Cardiology

## 2018-04-04 VITALS — BP 132/72 | HR 86 | Ht 66.5 in

## 2018-04-04 DIAGNOSIS — I5189 Other ill-defined heart diseases: Secondary | ICD-10-CM

## 2018-04-04 DIAGNOSIS — Z01818 Encounter for other preprocedural examination: Secondary | ICD-10-CM

## 2018-04-04 DIAGNOSIS — I200 Unstable angina: Secondary | ICD-10-CM | POA: Diagnosis not present

## 2018-04-04 DIAGNOSIS — R079 Chest pain, unspecified: Secondary | ICD-10-CM | POA: Insufficient documentation

## 2018-04-04 DIAGNOSIS — E785 Hyperlipidemia, unspecified: Secondary | ICD-10-CM

## 2018-04-04 DIAGNOSIS — R0609 Other forms of dyspnea: Secondary | ICD-10-CM | POA: Insufficient documentation

## 2018-04-04 DIAGNOSIS — R0600 Dyspnea, unspecified: Secondary | ICD-10-CM | POA: Insufficient documentation

## 2018-04-04 MED ORDER — ASPIRIN EC 81 MG PO TBEC: 81.0000 mg | DELAYED_RELEASE_TABLET | Freq: Every day | ORAL | 3 refills | Status: DC

## 2018-04-04 NOTE — Progress Notes (Signed)
PCP: Jinny Sanders, MD  CT Surgeon: Dr. Roxan Hockey Pulmonary: Dr. Valeta Harms / Dr. Annamaria Boots & Anselm Lis, NP  Clinic Note: Chief Complaint  Patient presents with  . New Patient (Initial Visit)    has pending lung cancer surgery -- referred for Cardiology Evaluation  . Chest Pain    with exertion  . Shortness of Breath    with exertion -- getting worse    HPI:   Hannah Green is a 73 y.o. female who is being seen today for Preoperative Cardiac evaluation of DOE & chest tightness at the request of  &Dr. Roxan Hockey -- plan R VATS-Wedge Resection/possible upper lobectomy.  Hannah Green was last seen on 06/17/2016 - f/u evaluation for DOE.   Recent Hospitalizations: none  Studies Personally Reviewed - (if available, images/films reviewed: From Epic Chart or Care Everywhere)  Myoview 01/2016 & Echo 12/2015 see [facility]  Interval History: Hannah Green is referred at the request of Dr. Roxan Hockey as part of evaluation for upcoming lung surgery.  He was concerned about exertional dyspnea and some chest discomfort.  She comes in today telling me that she pretty much gets short of breath with any type of activity.  Simply walking from the waiting room to this clinic room made her profoundly dyspneic and she actually has been having chest pain now that is described as a tightness and squeezing with her dyspnea if she does not stop when she begins getting short of breath.  She says that the dyspnea has been going on for about the last 3 or 4 months, but is really gotten worse over the last month to the point where doing any minimal exertion will make her short of breath.  The chest pain only started a couple weeks ago (maybe a month ago, she has a hard time remembering but was noting it when she saw Dr. Roxan Hockey last --she had been chalking both the dyspnea and chest pain up to being COPD related.  During that evaluation she was noticing symptoms simply walking to her mailbox and back, it has gotten worse  since then)  She denies any active CHF symptoms such as PND, orthopnea or edema.   Remainder of cardiovascular review of symptoms: No palpitations, lightheadedness, dizziness, weakness or syncope/near syncope. No TIA/amaurosis fugax symptoms.  No claudication.  ROS: A comprehensive was performed. Review of Systems  HENT: Positive for hearing loss. Negative for congestion and nosebleeds.        She is noted some trouble swallowing.  Respiratory: Positive for shortness of breath and wheezing. Negative for cough (Not really noticing cough since her flu in November).   Cardiovascular: Positive for chest pain, palpitations and leg swelling.  Gastrointestinal: Positive for abdominal pain, constipation, diarrhea and heartburn. Negative for blood in stool and melena.  Genitourinary: Negative for hematuria.  Musculoskeletal: Positive for joint pain (Mild OA related arthralgias.) and myalgias (Some leg cramps when sleeping.).  Neurological: Negative for dizziness and focal weakness.  Psychiatric/Behavioral: Negative for depression.       Seems to be handling her cancer diagnosis well without any significant psychiatric issues.   I have reviewed and (if needed) personally updated the patient's problem list, medications, allergies, past medical and surgical history, social and family history.   Past Medical History:  Diagnosis Date  . Breast cancer ([place]) 1999  . Cataract 2017   resolved with surgery  . GERD (gastroesophageal reflux disease)   . Hyperlipidemia   . Rheumatoid arthritis(714.0)  Past Surgical History:  Procedure Laterality Date  . BREAST LUMPECTOMY  1999   left, needed radiation  . CATARACT EXTRACTION W/ INTRAOCULAR LENS  IMPLANT, BILATERAL Bilateral 05/19/15, 06/02/15  . CHOLECYSTECTOMY    . NM MYOVIEW LTD  01/2016   LOW RISK. Hypertensive response to exercise followed by significant drop (stage I pressure 211/70 drop to 118/75. Stage II). Apical artifact but otherwise no  ischemia or infarction. EF 64%.  . TONSILLECTOMY    . TRANSTHORACIC ECHOCARDIOGRAM  01/12/2016   Mild LVH. EF 60-65%. GR 2-DD    Current Meds  Medication Sig  . atorvastatin (LIPITOR) 10 MG tablet Take 1 tablet (10 mg total) by mouth daily.  . Cholecalciferol (VITAMIN D3) 1.25 MG (50000 UT) CAPS TAKE 1 CAPSULE BY MOUTH ONE TIME PER WEEK (Patient taking differently: Take 50,000 Units by mouth every Friday. )  . Fluticasone-Salmeterol (ADVAIR) 250-50 MCG/DOSE AEPB Inhale 1 puff into the lungs every 12 (twelve) hours.  . metroNIDAZOLE (METROGEL) 1 % gel Apply topically daily. (Patient taking differently: Apply 1 application topically daily as needed (rosacea). )  . VENTOLIN HFA 108 (90 Base) MCG/ACT inhaler TAKE 2 PUFFS BY MOUTH EVERY 6 HOURS AS NEEDED FOR WHEEZE OR SHORTNESS OF BREATH (Patient taking differently: Inhale 2 puffs into the lungs every 6 (six) hours as needed for shortness of breath. )  . [DISCONTINUED] Coenzyme Q10 (CO Q-10) 100 MG CAPS Take 300 mg by mouth daily. (Patient taking differently: Take 400 mg by mouth daily. )    Allergies  Allergen Reactions  . Adhesive [Tape] Rash    Paper tape causes rash  . Penicillins Rash    Did it involve swelling of the face/tongue/throat, SOB, or low BP? No Did it involve sudden or severe rash/hives, skin peeling, or any reaction on the inside of your mouth or nose? Yes Did you need to seek medical attention at a hospital or doctor's office? Yes When did it last happen?childhood allergy If all above answers are "NO", may proceed with cephalosporin use.     Social History   Tobacco Use  . Smoking status: Former Smoker    Packs/day: 1.50    Years: 50.00    Pack years: 75.00    Types: Cigarettes    Last attempt to quit: 04/01/2011    Years since quitting: 7.0  . Smokeless tobacco: Never Used  . Tobacco comment: has stopped but has started again in "crisis"  Substance Use Topics  . Alcohol use: Yes    Alcohol/week: 2.0  standard drinks    Types: 2 Glasses of wine per week  . Drug use: No   Social History   Social History Narrative   Lives alone   No living will   Requests friend Porfirio Mylar to make health care decisions.   Would accept resuscitation but no prolonged ventilation   Would not want tube feeds if cognitively unaware    family history includes Cancer in her father, mother, and sister; Diabetes in her maternal grandmother and paternal uncle.  Wt Readings from Last 3 Encounters:  03/29/18 220 lb (99.8 kg)  02/14/18 223 lb 8 oz (101.4 kg)  12/12/17 228 lb 8 oz (103.6 kg)  Declined being weighed  PHYSICAL EXAM BP 132/72   Pulse 86   Ht 5' 6.5" (1.689 m)   LMP  (LMP Unknown)   BMI 34.98 kg/m  Physical Exam  Constitutional: She is oriented to person, place, and time. She appears well-developed and well-nourished. No distress.  Healthy-appearing.  Well-groomed.  Neck: Normal range of motion. Neck supple. Hepatojugular reflux (From 8 up to 10 cmH2O) and JVD (Minimal roughly 8 cm water.) present. Carotid bruit is not present. No thyroid mass and no thyromegaly present.  Cardiovascular: Normal rate, regular rhythm, S1 normal, S2 normal and normal pulses.  No extrasystoles are present. PMI is not displaced (Difficult to palpate). Exam reveals distant heart sounds. Exam reveals no gallop and no friction rub.  No murmur heard. Pulmonary/Chest: Effort normal. No respiratory distress (Nonlabored). She has wheezes (Mild end expiratory). She has no rales. She exhibits no tenderness.  Abdominal: Soft. Bowel sounds are normal. She exhibits no distension. There is no abdominal tenderness. There is no rebound.  Obese.  No HSM  Musculoskeletal: Normal range of motion.        General: No deformity or edema.  Neurological: She is alert and oriented to person, place, and time. No cranial nerve deficit.  Skin: Skin is warm.  Psychiatric: She has a normal mood and affect. Her behavior is normal. Judgment  and thought content normal.  Vitals reviewed.   Adult ECG Report  Rate: 86 ;  Rhythm: normal sinus rhythm and Normal axis, intervals and durations;   Narrative Interpretation: Normal EKG   Other studies Reviewed: Additional studies/ records that were reviewed today include:  Recent Labs:   Lab Results  Component Value Date   CHOL 178 12/05/2017   HDL 65.30 12/05/2017   LDLCALC 85 12/05/2017   LDLDIRECT 136.2 06/03/2011   TRIG 136.0 12/05/2017   CHOLHDL 3 12/05/2017    ASSESSMENT / PLAN: Problem List Items Addressed This Visit    Diastolic dysfunction without heart failure (Chronic)    Previous echocardiogram showed some diastolic dysfunction.  Not to the extent that would explain her dyspnea. Plan: Check 2D echocardiogram and plan right left heart catheterization based on significant progression of symptoms.      Relevant Orders   EKG 12-Lead (Completed)   Basic metabolic panel (Completed)   CBC (Completed)   LEFT AND RIGHT HEART CATHETERIZATION WITH CORONARY/GRAFT ANGIOGRAM   ECHOCARDIOGRAM COMPLETE (Completed)   DOE (dyspnea on exertion)    Profound worsening exertional dyspnea which to me is concerning for possible progressive angina.  It could certainly be COPD related, but without wheezing and coughing, I do not see a correlation with COPD. Plan: Right left heart catheterization      Relevant Orders   EKG 12-Lead (Completed)   Basic metabolic panel (Completed)   CBC (Completed)   LEFT AND RIGHT HEART CATHETERIZATION WITH CORONARY/GRAFT ANGIOGRAM   ECHOCARDIOGRAM COMPLETE (Completed)   Hyperlipidemia with target LDL less than 100 (Chronic)    Currently on low-dose Lipitor.  Most recent LDL was 85.  Pending cardiac catheterization findings may want to be more aggressive.      Relevant Medications   aspirin EC 81 MG tablet   Progressive angina (HCC) - Primary    I am concerned that her symptoms of progressively worsening dyspnea and chest tightness could very  well be related to progressive angina. She does have history of smoking and has hyperlipidemia.  No family history of CAD, but symptoms are concerning for angina.  With the severity of her symptoms and the progressive nature, I think the most expeditious way to rule out occlusive CAD and potentially treat a lesion if identified would be to proceed with cardiac catheterization.  This would avoid delay for stress test or even a coronary CT angiogram.  In light of  the fact that she has an upcoming lung surgery, I would like to get this answer as quick as possible also she can proceed with her surgery.  If PCI is indicated, then would probably delay her surgery for 3 months to allow for dual antiplatelet therapy.  For now, I will hold off on starting any medications until we see her.  I would like to put her on a beta-blocker if there is CAD, but if there is not I do not want to exacerbate any COPD. She is on statin and we will treat her with aspirin precath.  PLAN: Because of the significant dyspnea as well as chest pain, with her lung cancer and COPD, recommend right and left heart catheterization.  Cath Lab Visit (complete for each Cath Lab visit)  Clinical Evaluation Leading to the Procedure:   ACS: No.  Non-ACS:    Anginal Classification: CCS III  Anti-ischemic medical therapy: No Therapy  Non-Invasive Test Results: No non-invasive testing performed  Prior CABG: No previous CABG   Performing MD:  Glenetta Hew , M.D., M.S.  Procedure: RIGHT AND LEFT HEART CATHETERIZATION WITH CORONARY ANGIOGRAPHY AND POSSIBLE PERCUTANEOUS CORONARY INTERVENTION  The procedure with Risks/Benefits/Alternatives and Indications was reviewed with the patient and her good friend.  All questions were answered.    Risks / Complications include, but not limited to: Death, MI, CVA/TIA, VF/VT (with defibrillation), Bradycardia (need for temporary pacer placement), contrast induced nephropathy, bleeding /  bruising / hematoma / pseudoaneurysm, vascular or coronary injury (with possible emergent CT or Vascular Surgery), adverse medication reactions, infection.  Additional risks involving the use of radiation with the possibility of radiation burns and cancer were explained in detail.  The patient voices understanding and agrees to proceed.         Relevant Medications   aspirin EC 81 MG tablet   Other Relevant Orders   CBC (Completed)   LEFT AND RIGHT HEART CATHETERIZATION WITH CORONARY/GRAFT ANGIOGRAM   ECHOCARDIOGRAM COMPLETE (Completed)    Other Visit Diagnoses    Pre-op testing       Relevant Orders   EKG 12-Lead (Completed)   Basic metabolic panel (Completed)   CBC (Completed)      I spent a total of 45-50 minutes with the patient and chart review. >  50% of the time was spent in direct patient consultation.  We talked extensively about her symptoms and potential ways of evaluating with the most expediency.  Very concerning symptoms with progression of severity.  We talked about cardiac catheterization versus stress test or coronary CT angiogram.  Once we decided on cardiac catheterization, I then explained the risk, benefits, alternatives and indications as noted above.  All questions were answered.  Current medicines are reviewed at length with the patient today.  (+/- concerns) none  The following changes have been made:  none  Patient Instructions  Medication Instructions:  NOT NEEDED If you need a refill on your cardiac medications before your next appointment, please call your pharmacy.   Lab work: SEE  CARDIAC CATH INSTRUCTIONS If you have labs (blood work) drawn today and your tests are completely normal, you will receive your results only by: Marland Kitchen MyChart Message (if you have MyChart) OR . A paper copy in the mail If you have any lab test that is abnormal or we need to change your treatment, we will call you to review the results.  Testing/Procedures: WILL SCHEDULE  RIGHT & LEFT HEART CATH AT [facility] Your physician has  requested that you have a cardiac catheterization.   AND SCHEDULE AT [place] has requested that you have an echocardiogram.   Follow-Up: At [facility], you and your health needs are our priority.  As part of our continuing mission to provide you with exceptional heart care, we have created designated Provider Care Teams.  These Care Teams include your primary Cardiologist (physician) and Advanced Practice Providers (APPs -  Physician Assistants and Nurse Practitioners) who all work together to provide you with the care you need, when you need it. . Your physician recommends that you schedule a follow-up appointment in 2 -3 WEEKS .   Any Other Special Instructions Will Be Listed Below (If Applicable).  Studies Ordered:   Orders Placed This Encounter  Procedures  . Basic metabolic panel  . CBC  . EKG 12-Lead  . ECHOCARDIOGRAM COMPLETE  . LEFT AND RIGHT HEART CATHETERIZATION WITH CORONARY/GRAFT Illene Silver, M.D., M.S. Interventional Cardiologist   Pager # [phone] Phone # [phone] [address] [place], [place] [address]   Thank you for choosing Heartcare at [facility]!!

## 2018-04-04 NOTE — Patient Instructions (Signed)
Medication Instructions:  NOT NEEDED If you need a refill on your cardiac medications before your next appointment, please call your pharmacy.   Lab work: SEE  CARDIAC CATH INSTRUCTIONS If you have labs (blood work) drawn today and your tests are completely normal, you will receive your results only by: Marland Kitchen MyChart Message (if you have MyChart) OR . A paper copy in the mail If you have any lab test that is abnormal or we need to change your treatment, we will call you to review the results.  Testing/Procedures: WILL SCHEDULE LEFT HEART CATH AT [facility] Your physician has requested that you have a cardiac catheterization. Cardiac catheterization is used to diagnose and/or treat various heart conditions. Doctors may recommend this procedure for a number of different reasons. The most common reason is to evaluate chest pain. Chest pain can be a symptom of coronary artery disease (CAD), and cardiac catheterization can show whether plaque is narrowing or blocking your heart's arteries. This procedure is also used to evaluate the valves, as well as measure the blood flow and oxygen levels in different parts of your heart. For further information please visit [email or web address]. Please follow instruction sheet, as given. AND SCHEDULE AT [place] has requested that you have an echocardiogram. Echocardiography is a painless test that uses sound waves to create images of your heart. It provides your doctor with information about the size and shape of your heart and how well your heart's chambers and valves are working. This procedure takes approximately one hour. There are no restrictions for this procedure.     Follow-Up: At [facility], you and your health needs are our priority.  As part of our continuing mission to provide you with exceptional heart care, we have created designated Provider Care Teams.  These Care Teams include your primary Cardiologist  (physician) and Advanced Practice Providers (APPs -  Physician Assistants and Nurse Practitioners) who all work together to provide you with the care you need, when you need it. . Your physician recommends that you schedule a follow-up appointment in 2 -3 WEEKS .   Any Other Special Instructions Will Be Listed Below (If Applicable).      [place] [address] [place] [address] Dept: [phone] Loc: [place]  04/04/2018  You are scheduled for a RIGHT AND LEFT Cardiac Catheterization on ,TUESDAY February 11 with Dr. Glenetta Hew.  1. Please arrive at the [facility] (Main Entrance A) at [facility]: [address] [place], [place] [address] at 10 AM (This time is two hours before your procedure to ensure your preparation). Free valet parking service is available.   Special note: Every effort is made to have your procedure done on time. Please understand that emergencies sometimes delay scheduled procedures.  2. Diet: Do not eat solid foods after midnight.  The patient may have clear liquids until 5am upon the day of the procedure.  3. Labs: You will need to have blood drawn on , TODAY  04/04/18 at [place]  Open: 8am - 5pm (Lunch 12:30 - 1:30)   Phone: [phone]. You do not need to be fasting.  4. Medication instructions in preparation for your procedure:  On the morning of your procedure, take your Aspirin 81 MG  and any morning medicines NOT listed above.  You may use sips of water.  5.  Plan for one night stay--bring personal belongings. 6. Bring a current list of your medications and current insurance cards. 7. You MUST have a responsible person to drive you home. 8. Someone MUST be with you the first 24 hours after you arrive home or your discharge will be delayed. 9. Please wear clothes that are easy to get on  and off and wear slip-on shoes.  Thank you for allowing [place] to care for you!   -- [facility]

## 2018-04-04 NOTE — H&P (View-Only) (Signed)
PCP: Jinny Sanders, MD  CT Surgeon: Dr. Roxan Hockey Pulmonary: Dr. Valeta Harms / Dr. Annamaria Boots & Anselm Lis, NP  Clinic Note: Chief Complaint  Patient presents with  . New Patient (Initial Visit)    has pending lung cancer surgery -- referred for Cardiology Evaluation  . Chest Pain    with exertion  . Shortness of Breath    with exertion -- getting worse    HPI:   Hannah Green is a 73 y.o. female who is being seen today for Preoperative Cardiac evaluation of DOE & chest tightness at the request of  &Dr. Roxan Hockey -- plan R VATS-Wedge Resection/possible upper lobectomy.  Hannah Green was last seen on 06/17/2016 - f/u evaluation for DOE.   Recent Hospitalizations: none  Studies Personally Reviewed - (if available, images/films reviewed: From Epic Chart or Care Everywhere)  Myoview 01/2016 & Echo 12/2015 see [place]  Interval History: Hannah Green is referred at the request of Dr. Roxan Hockey as part of evaluation for upcoming lung surgery.  He was concerned about exertional dyspnea and some chest discomfort.  She comes in today telling me that she pretty much gets short of breath with any type of activity.  Simply walking from the waiting room to this clinic room made her profoundly dyspneic and she actually has been having chest pain now that is described as a tightness and squeezing with her dyspnea if she does not stop when she begins getting short of breath.  She says that the dyspnea has been going on for about the last 3 or 4 months, but is really gotten worse over the last month to the point where doing any minimal exertion will make her short of breath.  The chest pain only started a couple weeks ago (maybe a month ago, she has a hard time remembering but was noting it when she saw Dr. Roxan Hockey last --she had been chalking both the dyspnea and chest pain up to being COPD related.  During that evaluation she was noticing symptoms simply walking to her mailbox and back, it has gotten worse  since then)  She denies any active CHF symptoms such as PND, orthopnea or edema.   Remainder of cardiovascular review of symptoms: No palpitations, lightheadedness, dizziness, weakness or syncope/near syncope. No TIA/amaurosis fugax symptoms.  No claudication.  ROS: A comprehensive was performed. Review of Systems  HENT: Positive for hearing loss. Negative for congestion and nosebleeds.        She is noted some trouble swallowing.  Respiratory: Positive for shortness of breath and wheezing. Negative for cough (Not really noticing cough since her flu in November).   Cardiovascular: Positive for chest pain, palpitations and leg swelling.  Gastrointestinal: Positive for abdominal pain, constipation, diarrhea and heartburn. Negative for blood in stool and melena.  Genitourinary: Negative for hematuria.  Musculoskeletal: Positive for joint pain (Mild OA related arthralgias.) and myalgias (Some leg cramps when sleeping.).  Neurological: Negative for dizziness and focal weakness.  Psychiatric/Behavioral: Negative for depression.       Seems to be handling her cancer diagnosis well without any significant psychiatric issues.   I have reviewed and (if needed) personally updated the patient's problem list, medications, allergies, past medical and surgical history, social and family history.   Past Medical History:  Diagnosis Date  . Breast cancer ([facility]) 1999  . Cataract 2017   resolved with surgery  . GERD (gastroesophageal reflux disease)   . Hyperlipidemia   . Rheumatoid arthritis(714.0)  Past Surgical History:  Procedure Laterality Date  . BREAST LUMPECTOMY  1999   left, needed radiation  . CATARACT EXTRACTION W/ INTRAOCULAR LENS  IMPLANT, BILATERAL Bilateral 05/19/15, 06/02/15  . CHOLECYSTECTOMY    . NM MYOVIEW LTD  01/2016   LOW RISK. Hypertensive response to exercise followed by significant drop (stage I pressure 211/70 drop to 118/75. Stage II). Apical artifact but otherwise no  ischemia or infarction. EF 64%.  . TONSILLECTOMY    . TRANSTHORACIC ECHOCARDIOGRAM  01/12/2016   Mild LVH. EF 60-65%. GR 2-DD    Current Meds  Medication Sig  . atorvastatin (LIPITOR) 10 MG tablet Take 1 tablet (10 mg total) by mouth daily.  . Cholecalciferol (VITAMIN D3) 1.25 MG (50000 UT) CAPS TAKE 1 CAPSULE BY MOUTH ONE TIME PER WEEK (Patient taking differently: Take 50,000 Units by mouth every Friday. )  . Fluticasone-Salmeterol (ADVAIR) 250-50 MCG/DOSE AEPB Inhale 1 puff into the lungs every 12 (twelve) hours.  . metroNIDAZOLE (METROGEL) 1 % gel Apply topically daily. (Patient taking differently: Apply 1 application topically daily as needed (rosacea). )  . VENTOLIN HFA 108 (90 Base) MCG/ACT inhaler TAKE 2 PUFFS BY MOUTH EVERY 6 HOURS AS NEEDED FOR WHEEZE OR SHORTNESS OF BREATH (Patient taking differently: Inhale 2 puffs into the lungs every 6 (six) hours as needed for shortness of breath. )  . [DISCONTINUED] Coenzyme Q10 (CO Q-10) 100 MG CAPS Take 300 mg by mouth daily. (Patient taking differently: Take 400 mg by mouth daily. )    Allergies  Allergen Reactions  . Adhesive [Tape] Rash    Paper tape causes rash  . Penicillins Rash    Did it involve swelling of the face/tongue/throat, SOB, or low BP? No Did it involve sudden or severe rash/hives, skin peeling, or any reaction on the inside of your mouth or nose? Yes Did you need to seek medical attention at a hospital or doctor's office? Yes When did it last happen?childhood allergy If all above answers are "NO", may proceed with cephalosporin use.     Social History   Tobacco Use  . Smoking status: Former Smoker    Packs/day: 1.50    Years: 50.00    Pack years: 75.00    Types: Cigarettes    Last attempt to quit: 04/01/2011    Years since quitting: 7.0  . Smokeless tobacco: Never Used  . Tobacco comment: has stopped but has started again in "crisis"  Substance Use Topics  . Alcohol use: Yes    Alcohol/week: 2.0  standard drinks    Types: 2 Glasses of wine per week  . Drug use: No   Social History   Social History Narrative   Lives alone   No living will   Requests friend Porfirio Mylar to make health care decisions.   Would accept resuscitation but no prolonged ventilation   Would not want tube feeds if cognitively unaware    family history includes Cancer in her father, mother, and sister; Diabetes in her maternal grandmother and paternal uncle.  Wt Readings from Last 3 Encounters:  03/29/18 220 lb (99.8 kg)  02/14/18 223 lb 8 oz (101.4 kg)  12/12/17 228 lb 8 oz (103.6 kg)  Declined being weighed  PHYSICAL EXAM BP 132/72   Pulse 86   Ht 5' 6.5" (1.689 m)   LMP  (LMP Unknown)   BMI 34.98 kg/m  Physical Exam  Constitutional: She is oriented to person, place, and time. She appears well-developed and well-nourished. No distress.  Healthy-appearing.  Well-groomed.  Neck: Normal range of motion. Neck supple. Hepatojugular reflux (From 8 up to 10 cmH2O) and JVD (Minimal roughly 8 cm water.) present. Carotid bruit is not present. No thyroid mass and no thyromegaly present.  Cardiovascular: Normal rate, regular rhythm, S1 normal, S2 normal and normal pulses.  No extrasystoles are present. PMI is not displaced (Difficult to palpate). Exam reveals distant heart sounds. Exam reveals no gallop and no friction rub.  No murmur heard. Pulmonary/Chest: Effort normal. No respiratory distress (Nonlabored). She has wheezes (Mild end expiratory). She has no rales. She exhibits no tenderness.  Abdominal: Soft. Bowel sounds are normal. She exhibits no distension. There is no abdominal tenderness. There is no rebound.  Obese.  No HSM  Musculoskeletal: Normal range of motion.        General: No deformity or edema.  Neurological: She is alert and oriented to person, place, and time. No cranial nerve deficit.  Skin: Skin is warm.  Psychiatric: She has a normal mood and affect. Her behavior is normal. Judgment  and thought content normal.  Vitals reviewed.   Adult ECG Report  Rate: 86 ;  Rhythm: normal sinus rhythm and Normal axis, intervals and durations;   Narrative Interpretation: Normal EKG   Other studies Reviewed: Additional studies/ records that were reviewed today include:  Recent Labs:   Lab Results  Component Value Date   CHOL 178 12/05/2017   HDL 65.30 12/05/2017   LDLCALC 85 12/05/2017   LDLDIRECT 136.2 06/03/2011   TRIG 136.0 12/05/2017   CHOLHDL 3 12/05/2017    ASSESSMENT / PLAN: Problem List Items Addressed This Visit    Diastolic dysfunction without heart failure (Chronic)    Previous echocardiogram showed some diastolic dysfunction.  Not to the extent that would explain her dyspnea. Plan: Check 2D echocardiogram and plan right left heart catheterization based on significant progression of symptoms.      Relevant Orders   EKG 12-Lead (Completed)   Basic metabolic panel (Completed)   CBC (Completed)   LEFT AND RIGHT HEART CATHETERIZATION WITH CORONARY/GRAFT ANGIOGRAM   ECHOCARDIOGRAM COMPLETE (Completed)   DOE (dyspnea on exertion)    Profound worsening exertional dyspnea which to me is concerning for possible progressive angina.  It could certainly be COPD related, but without wheezing and coughing, I do not see a correlation with COPD. Plan: Right left heart catheterization      Relevant Orders   EKG 12-Lead (Completed)   Basic metabolic panel (Completed)   CBC (Completed)   LEFT AND RIGHT HEART CATHETERIZATION WITH CORONARY/GRAFT ANGIOGRAM   ECHOCARDIOGRAM COMPLETE (Completed)   Hyperlipidemia with target LDL less than 100 (Chronic)    Currently on low-dose Lipitor.  Most recent LDL was 85.  Pending cardiac catheterization findings may want to be more aggressive.      Relevant Medications   aspirin EC 81 MG tablet   Progressive angina (HCC) - Primary    I am concerned that her symptoms of progressively worsening dyspnea and chest tightness could very  well be related to progressive angina. She does have history of smoking and has hyperlipidemia.  No family history of CAD, but symptoms are concerning for angina.  With the severity of her symptoms and the progressive nature, I think the most expeditious way to rule out occlusive CAD and potentially treat a lesion if identified would be to proceed with cardiac catheterization.  This would avoid delay for stress test or even a coronary CT angiogram.  In light of  the fact that she has an upcoming lung surgery, I would like to get this answer as quick as possible also she can proceed with her surgery.  If PCI is indicated, then would probably delay her surgery for 3 months to allow for dual antiplatelet therapy.  For now, I will hold off on starting any medications until we see her.  I would like to put her on a beta-blocker if there is CAD, but if there is not I do not want to exacerbate any COPD. She is on statin and we will treat her with aspirin precath.  PLAN: Because of the significant dyspnea as well as chest pain, with her lung cancer and COPD, recommend right and left heart catheterization.  Cath Lab Visit (complete for each Cath Lab visit)  Clinical Evaluation Leading to the Procedure:   ACS: No.  Non-ACS:    Anginal Classification: CCS III  Anti-ischemic medical therapy: No Therapy  Non-Invasive Test Results: No non-invasive testing performed  Prior CABG: No previous CABG   Performing MD:  Glenetta Hew , M.D., M.S.  Procedure: RIGHT AND LEFT HEART CATHETERIZATION WITH CORONARY ANGIOGRAPHY AND POSSIBLE PERCUTANEOUS CORONARY INTERVENTION  The procedure with Risks/Benefits/Alternatives and Indications was reviewed with the patient and her good friend.  All questions were answered.    Risks / Complications include, but not limited to: Death, MI, CVA/TIA, VF/VT (with defibrillation), Bradycardia (need for temporary pacer placement), contrast induced nephropathy, bleeding /  bruising / hematoma / pseudoaneurysm, vascular or coronary injury (with possible emergent CT or Vascular Surgery), adverse medication reactions, infection.  Additional risks involving the use of radiation with the possibility of radiation burns and cancer were explained in detail.  The patient voices understanding and agrees to proceed.         Relevant Medications   aspirin EC 81 MG tablet   Other Relevant Orders   CBC (Completed)   LEFT AND RIGHT HEART CATHETERIZATION WITH CORONARY/GRAFT ANGIOGRAM   ECHOCARDIOGRAM COMPLETE (Completed)    Other Visit Diagnoses    Pre-op testing       Relevant Orders   EKG 12-Lead (Completed)   Basic metabolic panel (Completed)   CBC (Completed)      I spent a total of 45-50 minutes with the patient and chart review. >  50% of the time was spent in direct patient consultation.  We talked extensively about her symptoms and potential ways of evaluating with the most expediency.  Very concerning symptoms with progression of severity.  We talked about cardiac catheterization versus stress test or coronary CT angiogram.  Once we decided on cardiac catheterization, I then explained the risk, benefits, alternatives and indications as noted above.  All questions were answered.  Current medicines are reviewed at length with the patient today.  (+/- concerns) none  The following changes have been made:  none  Patient Instructions  Medication Instructions:  NOT NEEDED If you need a refill on your cardiac medications before your next appointment, please call your pharmacy.   Lab work: SEE  CARDIAC CATH INSTRUCTIONS If you have labs (blood work) drawn today and your tests are completely normal, you will receive your results only by: Marland Kitchen MyChart Message (if you have MyChart) OR . A paper copy in the mail If you have any lab test that is abnormal or we need to change your treatment, we will call you to review the results.  Testing/Procedures: WILL SCHEDULE  RIGHT & LEFT HEART CATH AT [facility] Your physician has  requested that you have a cardiac catheterization.   AND SCHEDULE AT [place] has requested that you have an echocardiogram.   Follow-Up: At [facility], you and your health needs are our priority.  As part of our continuing mission to provide you with exceptional heart care, we have created designated Provider Care Teams.  These Care Teams include your primary Cardiologist (physician) and Advanced Practice Providers (APPs -  Physician Assistants and Nurse Practitioners) who all work together to provide you with the care you need, when you need it. . Your physician recommends that you schedule a follow-up appointment in 2 -3 WEEKS .   Any Other Special Instructions Will Be Listed Below (If Applicable).  Studies Ordered:   Orders Placed This Encounter  Procedures  . Basic metabolic panel  . CBC  . EKG 12-Lead  . ECHOCARDIOGRAM COMPLETE  . LEFT AND RIGHT HEART CATHETERIZATION WITH CORONARY/GRAFT Illene Silver, M.D., M.S. Interventional Cardiologist   Pager # [phone] Phone # [phone] [address] [place], [place] [address]   Thank you for choosing Heartcare at [facility]!!

## 2018-04-05 ENCOUNTER — Other Ambulatory Visit: Payer: Self-pay | Admitting: *Deleted

## 2018-04-05 ENCOUNTER — Ambulatory Visit (HOSPITAL_COMMUNITY): Payer: Medicare HMO | Attending: Cardiovascular Disease

## 2018-04-05 ENCOUNTER — Telehealth: Payer: Self-pay | Admitting: Acute Care

## 2018-04-05 ENCOUNTER — Encounter: Payer: Self-pay | Admitting: Cardiology

## 2018-04-05 ENCOUNTER — Telehealth: Payer: Self-pay | Admitting: *Deleted

## 2018-04-05 DIAGNOSIS — I5189 Other ill-defined heart diseases: Secondary | ICD-10-CM | POA: Diagnosis not present

## 2018-04-05 DIAGNOSIS — R0600 Dyspnea, unspecified: Secondary | ICD-10-CM

## 2018-04-05 DIAGNOSIS — Z01818 Encounter for other preprocedural examination: Secondary | ICD-10-CM

## 2018-04-05 DIAGNOSIS — R0609 Other forms of dyspnea: Secondary | ICD-10-CM | POA: Diagnosis present

## 2018-04-05 DIAGNOSIS — I200 Unstable angina: Secondary | ICD-10-CM

## 2018-04-05 LAB — BASIC METABOLIC PANEL
BUN/Creatinine Ratio: 19 (ref 12–28)
BUN: 13 mg/dL (ref 8–27)
CO2: 25 mmol/L (ref 20–29)
Calcium: 9.3 mg/dL (ref 8.7–10.3)
Chloride: 99 mmol/L (ref 96–106)
Creatinine, Ser: 0.69 mg/dL (ref 0.57–1.00)
GFR calc Af Amer: 101 mL/min/{1.73_m2} (ref >59)
GFR calc non Af Amer: 87 mL/min/{1.73_m2} (ref >59)
Glucose: 101 mg/dL — ABNORMAL HIGH (ref 65–99)
Potassium: 4.7 mmol/L (ref 3.5–5.2)
Sodium: 140 mmol/L (ref 134–144)

## 2018-04-05 LAB — CBC
HEMATOCRIT: 45.5 % (ref 34.0–46.6)
Hemoglobin: 15.2 g/dL (ref 11.1–15.9)
MCH: 30.5 pg (ref 26.6–33.0)
MCHC: 33.4 g/dL (ref 31.5–35.7)
MCV: 91 fL (ref 79–97)
Platelets: 406 10*3/uL (ref 150–450)
RBC: 4.99 x10E6/uL (ref 3.77–5.28)
RDW: 12.8 % (ref 11.7–15.4)
WBC: 14.4 10*3/uL — ABNORMAL HIGH (ref 3.4–10.8)

## 2018-04-05 NOTE — Telephone Encounter (Signed)
-----   Message from Leonie Man, MD sent at 04/05/2018 10:51 AM EST ----- Labs are fine for cath. Normal kidney function - glucose is a bit high.  CBC is mostly normal with the exception of high WBC Glenetta Hew, MD

## 2018-04-05 NOTE — Telephone Encounter (Signed)
Spoke with pt, she is having surgery on her lung and would like SG to write a letter for her stating this. She will not be able to get a refund on her ticket if she doesn't. I advised her that she should ask her surgeon to write the letter but feels SG will get it done for her and still wanted me to request the letter. She doesn't have the surgery scheduled yet because she has to do some testing before they will schedule. SG would you be willing to write letter? Please advise.

## 2018-04-05 NOTE — Telephone Encounter (Signed)
LEFT DETAILED LAB RESULT MESSAGE ON  SECURE VOICE MAIL PER  DPI. ABS ARE GOOD FOR CATH ANY QUESTION MAY CALL

## 2018-04-05 NOTE — Assessment & Plan Note (Signed)
Currently on low-dose Lipitor.  Most recent LDL was 85.  Pending cardiac catheterization findings may want to be more aggressive.

## 2018-04-05 NOTE — Assessment & Plan Note (Signed)
Profound worsening exertional dyspnea which to me is concerning for possible progressive angina.  It could certainly be COPD related, but without wheezing and coughing, I do not see a correlation with COPD. Plan: Right left heart catheterization

## 2018-04-05 NOTE — Assessment & Plan Note (Addendum)
I am concerned that her symptoms of progressively worsening dyspnea and chest tightness could very well be related to progressive angina. She does have history of smoking and has hyperlipidemia.  No family history of CAD, but symptoms are concerning for angina.  With the severity of her symptoms and the progressive nature, I think the most expeditious way to rule out occlusive CAD and potentially treat a lesion if identified would be to proceed with cardiac catheterization.  This would avoid delay for stress test or even a coronary CT angiogram.  In light of the fact that she has an upcoming lung surgery, I would like to get this answer as quick as possible also she can proceed with her surgery.  If PCI is indicated, then would probably delay her surgery for 3 months to allow for dual antiplatelet therapy.  For now, I will hold off on starting any medications until we see her.  I would like to put her on a beta-blocker if there is CAD, but if there is not I do not want to exacerbate any COPD. She is on statin and we will treat her with aspirin precath.  PLAN: Because of the significant dyspnea as well as chest pain, with her lung cancer and COPD, recommend right and left heart catheterization.  Cath Lab Visit (complete for each Cath Lab visit)  Clinical Evaluation Leading to the Procedure:   ACS: No.  Non-ACS:    Anginal Classification: CCS III  Anti-ischemic medical therapy: No Therapy  Non-Invasive Test Results: No non-invasive testing performed  Prior CABG: No previous CABG   Performing MD:  Glenetta Hew , M.D., M.S.  Procedure: RIGHT AND LEFT HEART CATHETERIZATION WITH CORONARY ANGIOGRAPHY AND POSSIBLE PERCUTANEOUS CORONARY INTERVENTION  The procedure with Risks/Benefits/Alternatives and Indications was reviewed with the patient and her good friend.  All questions were answered.    Risks / Complications include, but not limited to: Death, MI, CVA/TIA, VF/VT (with  defibrillation), Bradycardia (need for temporary pacer placement), contrast induced nephropathy, bleeding / bruising / hematoma / pseudoaneurysm, vascular or coronary injury (with possible emergent CT or Vascular Surgery), adverse medication reactions, infection.  Additional risks involving the use of radiation with the possibility of radiation burns and cancer were explained in detail.  The patient voices understanding and agrees to proceed.

## 2018-04-05 NOTE — Assessment & Plan Note (Signed)
Previous echocardiogram showed some diastolic dysfunction.  Not to the extent that would explain her dyspnea. Plan: Check 2D echocardiogram and plan right left heart catheterization based on significant progression of symptoms.

## 2018-04-06 NOTE — Telephone Encounter (Signed)
Please write a letter, specifying that the patient is having surgery and the date. I can sign it Monday, and she can pick it up then. There may be a template in Epic for airline letters. Thanks

## 2018-04-09 ENCOUNTER — Telehealth: Payer: Self-pay | Admitting: *Deleted

## 2018-04-09 NOTE — Telephone Encounter (Addendum)
Pt contacted pre-catheterization scheduled at [facility] for: Tuesday April 10, 2018 12 noon Verified arrival time and place: [place] Entrance A at: 10 AM  No solid food after midnight prior to cath, clear liquids until 5 AM day of procedure. Contrast allergy: no  AM meds can be  taken pre-cath with sip of water including: ASA 81 mg  Confirm patient has responsible person to drive home post procedure and observe 24 hours after arriving home: yes  I reviewed instructions with patient, she verbalized understanding, thanked me for call.

## 2018-04-10 ENCOUNTER — Ambulatory Visit (HOSPITAL_COMMUNITY)
Admission: RE | Admit: 2018-04-10 | Discharge: 2018-04-10 | Disposition: A | Discharge status: Home / Self Care | Payer: Medicare HMO | Attending: Cardiology | Admitting: Cardiology

## 2018-04-10 ENCOUNTER — Other Ambulatory Visit: Payer: Self-pay

## 2018-04-10 ENCOUNTER — Encounter (HOSPITAL_COMMUNITY)
Admission: RE | Disposition: A | Discharge status: Home / Self Care | Payer: Self-pay | Source: Home / Self Care | Attending: Cardiology

## 2018-04-10 DIAGNOSIS — E785 Hyperlipidemia, unspecified: Secondary | ICD-10-CM | POA: Insufficient documentation

## 2018-04-10 DIAGNOSIS — Z888 Allergy status to other drugs, medicaments and biological substances status: Secondary | ICD-10-CM | POA: Insufficient documentation

## 2018-04-10 DIAGNOSIS — K219 Gastro-esophageal reflux disease without esophagitis: Secondary | ICD-10-CM | POA: Diagnosis not present

## 2018-04-10 DIAGNOSIS — I5189 Other ill-defined heart diseases: Secondary | ICD-10-CM

## 2018-04-10 DIAGNOSIS — Z87891 Personal history of nicotine dependence: Secondary | ICD-10-CM | POA: Diagnosis not present

## 2018-04-10 DIAGNOSIS — R0609 Other forms of dyspnea: Secondary | ICD-10-CM | POA: Diagnosis not present

## 2018-04-10 DIAGNOSIS — R0602 Shortness of breath: Secondary | ICD-10-CM | POA: Insufficient documentation

## 2018-04-10 DIAGNOSIS — Z79899 Other long term (current) drug therapy: Secondary | ICD-10-CM | POA: Diagnosis not present

## 2018-04-10 DIAGNOSIS — Z853 Personal history of malignant neoplasm of breast: Secondary | ICD-10-CM | POA: Diagnosis not present

## 2018-04-10 DIAGNOSIS — I10 Essential (primary) hypertension: Secondary | ICD-10-CM | POA: Diagnosis not present

## 2018-04-10 DIAGNOSIS — R079 Chest pain, unspecified: Secondary | ICD-10-CM | POA: Diagnosis present

## 2018-04-10 DIAGNOSIS — Z880 Allergy status to penicillin: Secondary | ICD-10-CM | POA: Diagnosis not present

## 2018-04-10 DIAGNOSIS — E119 Type 2 diabetes mellitus without complications: Secondary | ICD-10-CM | POA: Diagnosis not present

## 2018-04-10 DIAGNOSIS — R0600 Dyspnea, unspecified: Secondary | ICD-10-CM

## 2018-04-10 DIAGNOSIS — J449 Chronic obstructive pulmonary disease, unspecified: Secondary | ICD-10-CM | POA: Insufficient documentation

## 2018-04-10 DIAGNOSIS — I200 Unstable angina: Secondary | ICD-10-CM | POA: Diagnosis not present

## 2018-04-10 DIAGNOSIS — Z01818 Encounter for other preprocedural examination: Secondary | ICD-10-CM

## 2018-04-10 DIAGNOSIS — M069 Rheumatoid arthritis, unspecified: Secondary | ICD-10-CM | POA: Insufficient documentation

## 2018-04-10 HISTORY — PX: RIGHT/LEFT HEART CATH AND CORONARY ANGIOGRAPHY: CATH118266

## 2018-04-10 LAB — POCT I-STAT EG7
Acid-Base Excess: 2 mmol/L (ref 0.0–2.0)
Acid-Base Excess: 3 mmol/L — ABNORMAL HIGH (ref 0.0–2.0)
Bicarbonate: 29 mmol/L — ABNORMAL HIGH (ref 20.0–28.0)
Bicarbonate: 29.1 mmol/L — ABNORMAL HIGH (ref 20.0–28.0)
Calcium, Ion: 1.23 mmol/L (ref 1.15–1.40)
Calcium, Ion: 1.24 mmol/L (ref 1.15–1.40)
HCT: 37 % (ref 36.0–46.0)
HEMATOCRIT: 36 % (ref 36.0–46.0)
HEMOGLOBIN: 12.6 g/dL (ref 12.0–15.0)
Hemoglobin: 12.2 g/dL (ref 12.0–15.0)
O2 Saturation: 76 %
O2 Saturation: 76 %
POTASSIUM: 4.2 mmol/L (ref 3.5–5.1)
Potassium: 4.2 mmol/L (ref 3.5–5.1)
Sodium: 139 mmol/L (ref 135–145)
Sodium: 139 mmol/L (ref 135–145)
TCO2: 31 mmol/L (ref 22–32)
TCO2: 31 mmol/L (ref 22–32)
pCO2, Ven: 52.3 mmHg (ref 44.0–60.0)
pCO2, Ven: 52.4 mmHg (ref 44.0–60.0)
pH, Ven: 7.352 (ref 7.250–7.430)
pH, Ven: 7.352 (ref 7.250–7.430)
pO2, Ven: 44 mmHg (ref 32.0–45.0)
pO2, Ven: 44 mmHg (ref 32.0–45.0)

## 2018-04-10 LAB — POCT I-STAT 7, (LYTES, BLD GAS, ICA,H+H)
Acid-Base Excess: 2 mmol/L (ref 0.0–2.0)
Bicarbonate: 28.1 mmol/L — ABNORMAL HIGH (ref 20.0–28.0)
Calcium, Ion: 1.22 mmol/L (ref 1.15–1.40)
HCT: 36 % (ref 36.0–46.0)
Hemoglobin: 12.2 g/dL (ref 12.0–15.0)
O2 Saturation: 99 %
Potassium: 4.2 mmol/L (ref 3.5–5.1)
Sodium: 139 mmol/L (ref 135–145)
TCO2: 30 mmol/L (ref 22–32)
pCO2 arterial: 49.7 mmHg — ABNORMAL HIGH (ref 32.0–48.0)
pH, Arterial: 7.36 (ref 7.350–7.450)
pO2, Arterial: 126 mmHg — ABNORMAL HIGH (ref 83.0–108.0)

## 2018-04-10 SURGERY — RIGHT/LEFT HEART CATH AND CORONARY ANGIOGRAPHY
Anesthesia: LOCAL

## 2018-04-10 MED ORDER — FENTANYL CITRATE (PF) 100 MCG/2ML IJ SOLN
INTRAMUSCULAR | Status: AC
  Filled 2018-04-10: qty 2

## 2018-04-10 MED ORDER — SODIUM CHLORIDE 0.9 % IV SOLN: INTRAVENOUS | Status: DC

## 2018-04-10 MED ORDER — FENTANYL CITRATE (PF) 100 MCG/2ML IJ SOLN
INTRAMUSCULAR | Status: DC | PRN
  Administered 2018-04-10 (×2): 25 ug via INTRAVENOUS

## 2018-04-10 MED ORDER — MIDAZOLAM HCL 2 MG/2ML IJ SOLN
INTRAMUSCULAR | Status: AC
  Filled 2018-04-10: qty 2

## 2018-04-10 MED ORDER — MIDAZOLAM HCL 2 MG/2ML IJ SOLN
INTRAMUSCULAR | Status: DC | PRN
  Administered 2018-04-10 (×2): 1 mg via INTRAVENOUS

## 2018-04-10 MED ORDER — LIDOCAINE HCL (PF) 1 % IJ SOLN
INTRAMUSCULAR | Status: DC | PRN
  Administered 2018-04-10 (×2): 2 mL

## 2018-04-10 MED ORDER — SODIUM CHLORIDE 0.9 % IV SOLN
INTRAVENOUS | Status: DC
  Administered 2018-04-10: 11:00:00 via INTRAVENOUS

## 2018-04-10 MED ORDER — ONDANSETRON HCL 4 MG/2ML IJ SOLN: 4.0000 mg | Freq: Four times a day (QID) | INTRAMUSCULAR | Status: DC | PRN

## 2018-04-10 MED ORDER — HEPARIN (PORCINE) IN NACL 1000-0.9 UT/500ML-% IV SOLN
INTRAVENOUS | Status: AC
  Filled 2018-04-10: qty 1000

## 2018-04-10 MED ORDER — HEPARIN SODIUM (PORCINE) 1000 UNIT/ML IJ SOLN
INTRAMUSCULAR | Status: DC | PRN
  Administered 2018-04-10: 5000 [IU] via INTRAVENOUS

## 2018-04-10 MED ORDER — VERAPAMIL HCL 2.5 MG/ML IV SOLN
INTRAVENOUS | Status: AC
  Filled 2018-04-10: qty 2

## 2018-04-10 MED ORDER — ACETAMINOPHEN 325 MG PO TABS: 650.0000 mg | ORAL_TABLET | ORAL | Status: DC | PRN

## 2018-04-10 MED ORDER — HEPARIN (PORCINE) IN NACL 1000-0.9 UT/500ML-% IV SOLN
INTRAVENOUS | Status: DC | PRN
  Administered 2018-04-10: 500 mL

## 2018-04-10 MED ORDER — SODIUM CHLORIDE 0.9% FLUSH: 3.0000 mL | Freq: Two times a day (BID) | INTRAVENOUS | Status: DC

## 2018-04-10 MED ORDER — IOHEXOL 350 MG/ML SOLN
INTRAVENOUS | Status: DC | PRN
  Administered 2018-04-10: 45 mL via INTRA_ARTERIAL

## 2018-04-10 MED ORDER — SODIUM CHLORIDE 0.9% FLUSH: 3.0000 mL | INTRAVENOUS | Status: DC | PRN

## 2018-04-10 MED ORDER — VERAPAMIL HCL 2.5 MG/ML IV SOLN
INTRAVENOUS | Status: DC | PRN
  Administered 2018-04-10: 10 mL via INTRA_ARTERIAL

## 2018-04-10 MED ORDER — SODIUM CHLORIDE 0.9 % IV SOLN: 250.0000 mL | INTRAVENOUS | Status: DC | PRN

## 2018-04-10 MED ORDER — LIDOCAINE HCL (PF) 1 % IJ SOLN
INTRAMUSCULAR | Status: AC
  Filled 2018-04-10: qty 30

## 2018-04-10 SURGICAL SUPPLY — 11 items
CATH BALLN WEDGE 5F 110CM (CATHETERS) ×1 IMPLANT
CATH OPTITORQUE TIG 4.0 5F (CATHETERS) ×1 IMPLANT
GLIDESHEATH SLEND A-KIT 6F 22G (SHEATH) ×1 IMPLANT
GUIDEWIRE INQWIRE 1.5J.035X260 (WIRE) IMPLANT
INQWIRE 1.5J .035X260CM (WIRE) ×2
KIT HEART LEFT (KITS) ×2 IMPLANT
PACK CARDIAC CATHETERIZATION (CUSTOM PROCEDURE TRAY) ×2 IMPLANT
SHEATH GLIDE SLENDER 4/5FR (SHEATH) ×1 IMPLANT
SHEATH PROBE COVER 6X72 (BAG) ×1 IMPLANT
TRANSDUCER W/STOPCOCK (MISCELLANEOUS) ×2 IMPLANT
TUBING CIL FLEX 10 FLL-RA (TUBING) ×2 IMPLANT

## 2018-04-10 NOTE — Discharge Instructions (Signed)
Radial Site Care ° °This sheet gives you information about how to care for yourself after your procedure. Your health care provider may also give you more specific instructions. If you have problems or questions, contact your health care provider. °What can I expect after the procedure? °After the procedure, it is common to have: °· Bruising and tenderness at the catheter insertion area. °Follow these instructions at home: °Medicines °· Take over-the-counter and prescription medicines only as told by your health care provider. °Insertion site care °· Follow instructions from your health care provider about how to take care of your insertion site. Make sure you: °? Wash your hands with soap and water before you change your bandage (dressing). If soap and water are not available, use hand sanitizer. °? Change your dressing as told by your health care provider. °? Leave stitches (sutures), skin glue, or adhesive strips in place. These skin closures may need to stay in place for 2 weeks or longer. If adhesive strip edges start to loosen and curl up, you may trim the loose edges. Do not remove adhesive strips completely unless your health care provider tells you to do that. °· Check your insertion site every day for signs of infection. Check for: °? Redness, swelling, or pain. °? Fluid or blood. °? Pus or a bad smell. °? Warmth. °· Do not take baths, swim, or use a hot tub until your health care provider approves. °· You may shower 24-48 hours after the procedure, or as directed by your health care provider. °? Remove the dressing and gently wash the site with plain soap and water. °? Pat the area dry with a clean towel. °? Do not rub the site. That could cause bleeding. °· Do not apply powder or lotion to the site. °Activity ° °· For 24 hours after the procedure, or as directed by your health care provider: °? Do not flex or bend the affected arm. °? Do not push or pull heavy objects with the affected arm. °? Do not  drive yourself home from the hospital or clinic. You may drive 24 hours after the procedure unless your health care provider tells you not to. °? Do not operate machinery or power tools. °· Do not lift anything that is heavier than 10 lb (4.5 kg), or the limit that you are told, until your health care provider says that it is safe. °· Ask your health care provider when it is okay to: °? Return to work or school. °? Resume usual physical activities or sports. °? Resume sexual activity. °General instructions °· If the catheter site starts to bleed, raise your arm and put firm pressure on the site. If the bleeding does not stop, get help right away. This is a medical emergency. °· If you went home on the same day as your procedure, a responsible adult should be with you for the first 24 hours after you arrive home. °· Keep all follow-up visits as told by your health care provider. This is important. °Contact a health care provider if: °· You have a fever. °· You have redness, swelling, or yellow drainage around your insertion site. °Get help right away if: °· You have unusual pain at the radial site. °· The catheter insertion area swells very fast. °· The insertion area is bleeding, and the bleeding does not stop when you hold steady pressure on the area. °· Your arm or hand becomes pale, cool, tingly, or numb. °These symptoms may represent a serious problem   that is an emergency. Do not wait to see if the symptoms will go away. Get medical help right away. Call your local emergency services (911 in the [place] Do not drive yourself to the hospital. °Summary °· After the procedure, it is common to have bruising and tenderness at the site. °· Follow instructions from your health care provider about how to take care of your radial site wound. Check the wound every day for signs of infection. °· Do not lift anything that is heavier than 10 lb (4.5 kg), or the limit that you are told, until your health care provider says  that it is safe. °This information is not intended to replace advice given to you by your health care provider. Make sure you discuss any questions you have with your health care provider. °Document Released: 03/19/2010 Document Revised: 03/22/2017 Document Reviewed: 03/22/2017 °Elsevier Interactive Patient Education © 2019 [employer] ° °

## 2018-04-10 NOTE — Interval H&P Note (Signed)
History and Physical Interval Note:  04/10/2018 10:43 AM  Hannah Green  has presented today for surgery, with the diagnosis of progressive angina. The various methods of treatment have been discussed with the patient and family. After consideration of risks, benefits and other options for treatment, the patient has consented to  Procedure(s): RIGHT/LEFT HEART CATH AND CORONARY ANGIOGRAPHY (N/A) with possible PERCUTANEOUS CORONARY INTERVENTION as a surgical intervention .  The patient's history has been reviewed, patient examined, no change in status, stable for surgery.  I have reviewed the patient's chart and labs.  Questions were answered to the patient's satisfaction.    Cath Lab Visit (complete for each Cath Lab visit)  Clinical Evaluation Leading to the Procedure:   ACS: No.  Non-ACS:    Anginal Classification: CCS III  Anti-ischemic medical therapy: Minimal Therapy (1 class of medications)  Non-Invasive Test Results: No non-invasive testing performed  Prior CABG: No previous CABG   Glenetta Hew

## 2018-04-11 ENCOUNTER — Encounter (HOSPITAL_COMMUNITY): Payer: Self-pay | Admitting: Cardiology

## 2018-04-11 NOTE — Telephone Encounter (Signed)
Spoke with pt, I requested the date and time of surgery. Pt still has not scheduled surgery yet but will call me back as soon as she knows. Will await return call.

## 2018-04-13 NOTE — Telephone Encounter (Signed)
Call  and spoke with the patient she still has not heard about a surgery date. Will follow up again

## 2018-04-16 ENCOUNTER — Encounter: Payer: Self-pay | Admitting: Cardiology

## 2018-04-16 ENCOUNTER — Ambulatory Visit: Payer: Medicare HMO | Admitting: Cardiology

## 2018-04-16 VITALS — BP 130/72 | HR 86 | Ht 66.5 in | Wt 220.0 lb

## 2018-04-16 DIAGNOSIS — R0609 Other forms of dyspnea: Secondary | ICD-10-CM | POA: Diagnosis not present

## 2018-04-16 DIAGNOSIS — R0600 Dyspnea, unspecified: Secondary | ICD-10-CM

## 2018-04-16 DIAGNOSIS — I5189 Other ill-defined heart diseases: Secondary | ICD-10-CM | POA: Diagnosis not present

## 2018-04-16 DIAGNOSIS — M25431 Effusion, right wrist: Secondary | ICD-10-CM

## 2018-04-16 DIAGNOSIS — R079 Chest pain, unspecified: Secondary | ICD-10-CM | POA: Diagnosis not present

## 2018-04-16 DIAGNOSIS — M25531 Pain in right wrist: Secondary | ICD-10-CM | POA: Diagnosis not present

## 2018-04-16 NOTE — Progress Notes (Signed)
PCP: Jinny Sanders, MD CVTS: Dr. Roxan Hockey Pulmonary: Dr. Valeta Harms / Dr. Annamaria Boots & Anselm Lis, NP  Clinic Note: Chief Complaint  Patient presents with  . Hospitalization Follow-up    Post-cath    HPI: Hannah Green is a 73 y.o. female with a PMH below who presents today for post-cath follow-up.  She had been referred by Dr. Roxan Hockey from CVTS for evaluation of exertional dyspnea and chest discomfort as part of preop evaluation for lung resection surgery.  Hannah Green just seen on February 5 for consultation and she noted significant exertional dyspnea and chest pain.  She was evaluated with an echocardiogram and referred for right and left heart catheterization just to exclude the presence of CAD and or significant pulmonary hypertension.  Recent Hospitalizations:   Cardiac Cath April 10, 2018  Studies Personally Reviewed - (if available, images/films reviewed: From Epic Chart or Care Everywhere)  (04/10/2018) RIGHT/LEFT HEART CATH AND CORONARY ANGIOGRAPHY --> angiographically normal coronary arteries.  Normal LV end-diastolic pressure with normal echo EF.  Relatively normal right heart cath pressures with no suggestion of comp pulmonary hypertension.  Normal cardiac output/index (5.9-2.8).  TTE April 05, 2018: Normal LV size and function.  EF 60 to 65%.  No R WMA.  GR 1 DD.  Normal RV size and function.  Normal pressures.  No valvular disease.  Interval History: Hannah Green returns today post cath.  She is doing fine from a dyspnea and chest pain standpoint.  Very happy to hear the results of her cath.  However, she is noting significant pain in her wrist at the access site and actually on the ulnar aspect of the wrist.  Just today she was able to squeeze her hand.  She has tenderness at the access site but no significant bruising or hematoma.  She does have soft puffy swelling on the ulnar aspect of the wrist that is tender.  She also has some weird tenderness along the pad  between the fifth and fourth digit on the hand.  Otherwise symptoms are pretty stable she still has exertional dyspnea but not as much of the chest discomfort.  Has not really done much since her cath, but was very happy to hear the results.  No PND orthopnea or edema. No palpitations, lightheadedness, dizziness, weakness or syncope/near syncope. No TIA/amaurosis fugax symptoms.   ROS: See HPI for details. Review of Systems  Constitutional: Negative for malaise/fatigue.  Respiratory: Positive for shortness of breath.   Gastrointestinal: Negative for abdominal pain, blood in stool and melena.  Genitourinary: Negative for hematuria.  Musculoskeletal:       Right wrist pain as noted in HPI  Neurological: Negative for dizziness and headaches.    I have reviewed and (if needed) personally updated the patient's problem list, medications, allergies, past medical and surgical history, social and family history.   Past Medical History:  Diagnosis Date  . Breast cancer ([place]) 1999  . Cataract 2017   resolved with surgery  . GERD (gastroesophageal reflux disease)   . Hyperlipidemia   . Rheumatoid arthritis(714.0)     Past Surgical History:  Procedure Laterality Date  . BREAST LUMPECTOMY  1999   left, needed radiation  . CATARACT EXTRACTION W/ INTRAOCULAR LENS  IMPLANT, BILATERAL Bilateral 05/19/15, 06/02/15  . CHOLECYSTECTOMY    . NM MYOVIEW LTD  01/2016   LOW RISK. Hypertensive response to exercise followed by significant drop (stage I pressure 211/70 drop to 118/75. Stage II). Apical artifact but otherwise no  ischemia or infarction. EF 64%.  Marland Kitchen RIGHT/LEFT HEART CATH AND CORONARY ANGIOGRAPHY N/A 04/10/2018   Procedure: RIGHT/LEFT HEART CATH AND CORONARY ANGIOGRAPHY;  Surgeon: Leonie Man, MD;  Location: [facility];; angiographically minimal coronary artery disease.  Normal LVEDP.  Basely normal RHC pressures with no suggestion of pulmonary hypertension.  Normal CO-CI by Fick  (5.9-2.8)  . TONSILLECTOMY    . TRANSTHORACIC ECHOCARDIOGRAM  11/'17; 2/'20   a) Nov 2017: Mild LVH. EF 60-65%. GR 2-DD; b) Feb 2020: Normal LV size and function.  EF 60 to 65%.  No R WMA.  GR 1 DD.  Normal RV size and function.  Normal pressures.  No valvular disease.    Current Meds  Medication Sig  . aspirin EC 81 MG tablet Take 1 tablet (81 mg total) by mouth daily. (Patient taking differently: Take 81 mg by mouth every evening. )  . atorvastatin (LIPITOR) 10 MG tablet Take 1 tablet (10 mg total) by mouth daily.  . Cholecalciferol (VITAMIN D3) 1.25 MG (50000 UT) CAPS TAKE 1 CAPSULE BY MOUTH ONE TIME PER WEEK (Patient taking differently: Take 50,000 Units by mouth every Friday. )  . Coenzyme Q10 (CO Q-10) 300 MG CAPS Take 300 mg by mouth daily.  . Fluticasone-Salmeterol (ADVAIR) 250-50 MCG/DOSE AEPB Inhale 1 puff into the lungs every 12 (twelve) hours.  . metroNIDAZOLE (METROGEL) 1 % gel Apply topically daily. (Patient taking differently: Apply 1 application topically daily as needed (rosacea). )  . VENTOLIN HFA 108 (90 Base) MCG/ACT inhaler TAKE 2 PUFFS BY MOUTH EVERY 6 HOURS AS NEEDED FOR WHEEZE OR SHORTNESS OF BREATH (Patient taking differently: Inhale 2 puffs into the lungs every 6 (six) hours as needed for shortness of breath. )    Allergies  Allergen Reactions  . Adhesive [Tape] Rash    Paper tape causes rash  . Penicillins Rash    Did it involve swelling of the face/tongue/throat, SOB, or low BP? No Did it involve sudden or severe rash/hives, skin peeling, or any reaction on the inside of your mouth or nose? Yes Did you need to seek medical attention at a hospital or doctor's office? Yes When did it last happen?childhood allergy If all above answers are "NO", may proceed with cephalosporin use.     Social History   Tobacco Use  . Smoking status: Former Smoker    Packs/day: 1.50    Years: 50.00    Pack years: 75.00    Types: Cigarettes    Last attempt to quit:  04/01/2011    Years since quitting: 7.0  . Smokeless tobacco: Never Used  . Tobacco comment: has stopped but has started again in "crisis"  Substance Use Topics  . Alcohol use: Yes    Alcohol/week: 2.0 standard drinks    Types: 2 Glasses of wine per week  . Drug use: No   Social History   Social History Narrative   Lives alone   No living will   Requests friend Porfirio Mylar to make health care decisions.   Would accept resuscitation but no prolonged ventilation   Would not want tube feeds if cognitively unaware    family history includes Cancer in her father, mother, and sister; Diabetes in her maternal grandmother and paternal uncle.  Wt Readings from Last 3 Encounters:  04/16/18 220 lb (99.8 kg)  04/10/18 220 lb (99.8 kg)  03/29/18 220 lb (99.8 kg)    PHYSICAL EXAM BP 130/72   Pulse 86   Ht 5'  6.5" (1.689 m)   Wt 220 lb (99.8 kg)   LMP  (LMP Unknown)   BMI 34.98 kg/m  Physical Exam  Constitutional: She appears well-developed and well-nourished. No distress.  Obese, but healthy-appearing.  Well-groomed  HENT:  Head: Normocephalic and atraumatic.  Neck: Normal range of motion. Neck supple. Hepatojugular reflux (Trivial) present. No JVD present. Carotid bruit is not present.  Cardiovascular: Normal rate, regular rhythm, normal heart sounds and intact distal pulses. Exam reveals no gallop and no friction rub.  No murmur heard. Right radial and ulnar pulses are palpable.  Normal Allen's test indicating no occlusion of the right radial artery.  Pulmonary/Chest: Effort normal and breath sounds normal. No respiratory distress.  Musculoskeletal: Normal range of motion.     Comments: Right radial tenderness to palpation at the access site but also on the ulnar aspect thenar eminence.  Difficulty squeezing fingers.  Skin: Skin is warm and dry.  The right wrist has mild bruising at the access site but no diffuse ecchymosis.  There is also some ecchymosis and bruising at the  brachial access site it is also painful.  Psychiatric: She has a normal mood and affect. Her behavior is normal. Judgment and thought content normal.  Vitals reviewed.    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  Simply continue to manage blood pressure.      DOE (dyspnea on exertion) (Chronic)    Significant exertional dyspnea, but normal echocardiogram with no suggestion of significant diastolic or systolic dysfunction.  Right heart pressures seem normal by echo and were confirmed to be normal by right heart cath. Normal normal cardiac output and index.  Again I suspect this is probably pulmonary in nature.      Exertional chest pain    Clearly not angina based on coronary angiography. I suspect that this could very well be pulmonary in nature.      Pain and swelling of wrist, right - Primary    Unusual post-cath symptoms.  Normal Allen's test would suggest adequate follow-up.  She probably has some bruising at the site which is painful.  But the the fleshy swelling along the ulnar aspect is very unusual.  I suspect that there may have been some trauma from the TR band.  Things seem to have improved over the last couple days. Recommendations:  USE SOFT WRIST BRACE FOR 2 WEEKS  -- HEAT IN THE MORNING -- ICE IN THE EVENING ( 15 MIN ON AND 15 OFF  TOTAL OF 3 TIMES.  --- USE SELF SQUEEZE TOY DURING THE DAY  We will follow-up brief visit next week.         Great news on the echocardiogram as well as right and left heart catheterization findings.  At this point there should be no cardiac impediment for her to have her lung surgery.  She was unsure as to who was  supposed to contact you about getting back into see Dr. Roxan Hockey.  (I did discuss with Dr. Koleen Nimrod and the indication was that she was post call.  We will let her know, but he will also have his office call her.)  I spent a total of 20-25 minutes with the  patient and chart review. >  50% of the time was spent in direct patient consultation.   Current medicines are reviewed at length with the patient today.  (+/- concerns) none The following changes have been made:  None  Patient Instructions  Medication Instructions:  NOT NEEDED If you need a refill on your cardiac medications before your next appointment, please call your pharmacy.   Lab work: NOT NEEDED If you have labs (blood work) drawn today and your tests are completely normal, you will receive your results only by: Marland Kitchen MyChart Message (if you have MyChart) OR . A paper copy in the mail If you have any lab test that is abnormal or we need to change your treatment, we will call you to review the results.  Testing/Procedures: NOT NEEDED  Follow-Up: At [facility], you and your health needs are our priority.  As part of our continuing mission to provide you with exceptional heart care, we have created designated Provider Care Teams.  These Care Teams include your primary Cardiologist (physician) and Advanced Practice Providers (APPs -  Physician Assistants and Nurse Practitioners) who all work together to provide you with the care you need, when you need it. Your physician recommends that you schedule a follow-up appointment in MARCH 11,2020  .   Any Other Special Instructions Will Be Listed Below (If Applicable).  USE SOFT WRIST BRACE FOR 2 WEEKS  -- HEAT IN THE MORNING -- ICE IN THE EVENING ( 15 MIN ON AND 15 OFF  TOTAL OF 3 TIMES.  --- USE SELF SQUEEZE TOY DURING THE DAY    Studies Ordered:   No orders of the defined types were placed in this encounter.     Glenetta Hew, M.D., M.S. Interventional Cardiologist    Pager # [phone] Phone # [phone] [address] [place], [place] [address]   Thank you for choosing Heartcare at [facility]!!

## 2018-04-16 NOTE — Patient Instructions (Signed)
Medication Instructions:  NOT NEEDED If you need a refill on your cardiac medications before your next appointment, please call your pharmacy.   Lab work: NOT NEEDED If you have labs (blood work) drawn today and your tests are completely normal, you will receive your results only by: Marland Kitchen MyChart Message (if you have MyChart) OR . A paper copy in the mail If you have any lab test that is abnormal or we need to change your treatment, we will call you to review the results.  Testing/Procedures: NOT NEEDED  Follow-Up: At [facility], you and your health needs are our priority.  As part of our continuing mission to provide you with exceptional heart care, we have created designated Provider Care Teams.  These Care Teams include your primary Cardiologist (physician) and Advanced Practice Providers (APPs -  Physician Assistants and Nurse Practitioners) who all work together to provide you with the care you need, when you need it. Your physician recommends that you schedule a follow-up appointment in MARCH 11,2020  .   Any Other Special Instructions Will Be Listed Below (If Applicable).  USE SOFT WRIST BRACE FOR 2 WEEKS  -- HEAT IN THE MORNING -- ICE IN THE EVENING ( 15 MIN ON AND 15 OFF  TOTAL OF 3 TIMES.  --- USE SELF SQUEEZE TOY DURING THE DAY

## 2018-04-17 ENCOUNTER — Other Ambulatory Visit: Payer: Self-pay | Admitting: *Deleted

## 2018-04-17 DIAGNOSIS — R911 Solitary pulmonary nodule: Secondary | ICD-10-CM

## 2018-04-18 ENCOUNTER — Encounter: Payer: Self-pay | Admitting: Cardiology

## 2018-04-18 DIAGNOSIS — M25531 Pain in right wrist: Principal | ICD-10-CM

## 2018-04-18 DIAGNOSIS — M25431 Effusion, right wrist: Secondary | ICD-10-CM | POA: Insufficient documentation

## 2018-04-18 NOTE — Pre-Procedure Instructions (Signed)
Hannah Green  04/18/2018      [id number] - [place], [address] [place] [address] Phone: [phone] Fax: [facility] [id number] [place], [place] - [address] [place] [address] Phone: [phone] Fax: [phone]    Your procedure is scheduled on Monday February 24th.  Report to [facility] Admitting at 9:00 A.M.  Call this number if you have problems the morning of surgery:  [phone]   Remember:  Do not eat or drink after midnight.    Take these medicines the morning of surgery with A SIP OF WATER  atorvastatin (LIPITOR) Fluticasone-Salmeterol (ADVAIR)  VENTOLIN HFA 108 (90 Base)-if needed *bring with you to the hospital on day of surgery*  As of today,  STOP taking any Aspirin(unless otherwise instructed by your surgeon), Aleve, Naproxen, Ibuprofen, Motrin, Advil, Goody's, BC's, all herbal medications, fish oil, and all vitamins      Do not wear jewelry, make-up or nail polish.  Do not wear lotions, powders, or perfumes, or deodorant.  Do not shave 48 hours prior to surgery.  Men may shave face and neck.  Do not bring valuables to the hospital.  [facility] is not responsible for any belongings or valuables.  Contacts, dentures or bridgework may not be worn into surgery.  Leave your suitcase in the car.  After surgery it may be brought to your room.  For patients admitted to the hospital, discharge time will be determined by your treatment team.  Patients discharged the day of surgery will not be allowed to drive home.   [facility]- Preparing For Surgery  Before surgery, you can play an important role. Because skin is not sterile, your skin needs to be as free of germs as possible. You can reduce the number of germs on your skin by washing with CHG (chlorahexidine gluconate) Soap before surgery.  CHG is an antiseptic  cleaner which kills germs and bonds with the skin to continue killing germs even after washing.    Oral Hygiene is also important to reduce your risk of infection.  Remember - BRUSH YOUR TEETH THE MORNING OF SURGERY WITH YOUR REGULAR TOOTHPASTE  Please do not use if you have an allergy to CHG or antibacterial soaps. If your skin becomes reddened/irritated stop using the CHG.  Do not shave (including legs and underarms) for at least 48 hours prior to first CHG shower. It is OK to shave your face.  Please follow these instructions carefully.   1. Shower the NIGHT BEFORE SURGERY and the MORNING OF SURGERY with CHG.   2. If you chose to wash your hair, wash your hair first as usual with your normal shampoo.  3. After you shampoo, rinse your hair and body thoroughly to remove the shampoo.  4. Use CHG as you would any other liquid soap. You can apply CHG directly to the skin and wash gently with a scrungie or a clean washcloth.   5. Apply the CHG Soap to your body ONLY FROM THE NECK DOWN.  Do not use on open wounds or open sores. Avoid contact with your eyes, ears, mouth and genitals (private parts). Wash Face and genitals (private parts)  with your normal soap.  6. Wash thoroughly, paying special attention to the area where your surgery will be performed.  7. Thoroughly rinse your body with warm water from the neck  down.  8. DO NOT shower/wash with your normal soap after using and rinsing off the CHG Soap.  9. Pat yourself dry with a CLEAN TOWEL.  10. Wear CLEAN PAJAMAS to bed the night before surgery, wear comfortable clothes the morning of surgery  11. Place CLEAN SHEETS on your bed the night of your first shower and DO NOT SLEEP WITH PETS.    Day of Surgery: Shower as stated above. Do not apply any deodorants/lotions.  Please wear clean clothes to the hospital/surgery center.   Remember to brush your teeth WITH YOUR REGULAR TOOTHPASTE.   Please read over the following fact  sheets that you were given.

## 2018-04-18 NOTE — Assessment & Plan Note (Signed)
Significant exertional dyspnea, but normal echocardiogram with no suggestion of significant diastolic or systolic dysfunction.  Right heart pressures seem normal by echo and were confirmed to be normal by right heart cath. Normal normal cardiac output and index.  Again I suspect this is probably pulmonary in nature.

## 2018-04-18 NOTE — Assessment & Plan Note (Signed)
Relatively normal LVEDP on LV hemodynamic measurement and only grade 1 diastolic function on echo.  Simply continue to manage blood pressure.

## 2018-04-18 NOTE — Assessment & Plan Note (Signed)
Clearly not angina based on coronary angiography. I suspect that this could very well be pulmonary in nature.

## 2018-04-18 NOTE — Assessment & Plan Note (Signed)
Unusual post-cath symptoms.  Normal Allen's test would suggest adequate follow-up.  She probably has some bruising at the site which is painful.  But the the fleshy swelling along the ulnar aspect is very unusual.  I suspect that there may have been some trauma from the TR band.  Things seem to have improved over the last couple days. Recommendations:  USE SOFT WRIST BRACE FOR 2 WEEKS  -- HEAT IN THE MORNING -- ICE IN THE EVENING ( 15 MIN ON AND 15 OFF  TOTAL OF 3 TIMES.  --- USE SELF SQUEEZE TOY DURING THE DAY  We will follow-up brief visit next week.

## 2018-04-19 ENCOUNTER — Other Ambulatory Visit: Payer: Self-pay

## 2018-04-19 ENCOUNTER — Encounter (HOSPITAL_COMMUNITY)
Admission: RE | Admit: 2018-04-19 | Discharge: 2018-04-19 | Disposition: A | Discharge status: Home / Self Care | Payer: Medicare HMO | Source: Ambulatory Visit | Attending: Thoracic Surgery (Cardiothoracic Vascular Surgery) | Admitting: Thoracic Surgery (Cardiothoracic Vascular Surgery)

## 2018-04-19 ENCOUNTER — Encounter (HOSPITAL_COMMUNITY): Payer: Self-pay

## 2018-04-19 DIAGNOSIS — Z01812 Encounter for preprocedural laboratory examination: Secondary | ICD-10-CM | POA: Diagnosis present

## 2018-04-19 DIAGNOSIS — R911 Solitary pulmonary nodule: Secondary | ICD-10-CM | POA: Diagnosis not present

## 2018-04-19 HISTORY — DX: Atherosclerotic heart disease of native coronary artery without angina pectoris: I25.10

## 2018-04-19 LAB — COMPREHENSIVE METABOLIC PANEL
ALK PHOS: 64 U/L (ref 38–126)
ALT: 18 U/L (ref 0–44)
AST: 19 U/L (ref 15–41)
Albumin: 3.7 g/dL (ref 3.5–5.0)
Anion gap: 10 (ref 5–15)
BILIRUBIN TOTAL: 0.8 mg/dL (ref 0.3–1.2)
BUN: 15 mg/dL (ref 8–23)
CO2: 24 mmol/L (ref 22–32)
Calcium: 9 mg/dL (ref 8.9–10.3)
Chloride: 104 mmol/L (ref 98–111)
Creatinine, Ser: 0.66 mg/dL (ref 0.44–1.00)
GFR calc Af Amer: >60 mL/min (ref >60)
GFR calc non Af Amer: >60 mL/min (ref >60)
Glucose, Bld: 121 mg/dL — ABNORMAL HIGH (ref 70–99)
Potassium: 4 mmol/L (ref 3.5–5.1)
Sodium: 138 mmol/L (ref 135–145)
TOTAL PROTEIN: 7.1 g/dL (ref 6.5–8.1)

## 2018-04-19 LAB — TYPE AND SCREEN
ABO/RH(D): A POS
Antibody Screen: NEGATIVE

## 2018-04-19 LAB — CBC
HCT: 43.9 % (ref 36.0–46.0)
HEMOGLOBIN: 14.1 g/dL (ref 12.0–15.0)
MCH: 30.1 pg (ref 26.0–34.0)
MCHC: 32.1 g/dL (ref 30.0–36.0)
MCV: 93.8 fL (ref 80.0–100.0)
Platelets: 419 10*3/uL — ABNORMAL HIGH (ref 150–400)
RBC: 4.68 MIL/uL (ref 3.87–5.11)
RDW: 13 % (ref 11.5–15.5)
WBC: 9.4 10*3/uL (ref 4.0–10.5)
nRBC: 0 % (ref 0.0–0.2)

## 2018-04-19 LAB — URINALYSIS, ROUTINE W REFLEX MICROSCOPIC
Bilirubin Urine: NEGATIVE
Glucose, UA: NEGATIVE mg/dL
HGB URINE DIPSTICK: NEGATIVE
Ketones, ur: 5 mg/dL — AB
Nitrite: NEGATIVE
Protein, ur: NEGATIVE mg/dL
Specific Gravity, Urine: 1.028 (ref 1.005–1.030)
pH: 5 (ref 5.0–8.0)

## 2018-04-19 LAB — PROTIME-INR
INR: 0.95
Prothrombin Time: 12.6 seconds (ref 11.4–15.2)

## 2018-04-19 LAB — SURGICAL PCR SCREEN
MRSA, PCR: NEGATIVE
Staphylococcus aureus: NEGATIVE

## 2018-04-19 LAB — ABO/RH: ABO/RH(D): A POS

## 2018-04-19 LAB — APTT: aPTT: 30 seconds (ref 24–36)

## 2018-04-19 NOTE — Progress Notes (Signed)
Left VM for Manuela Schwartz, RN with [facility] regarding UA results.

## 2018-04-19 NOTE — Anesthesia Preprocedure Evaluation (Addendum)
Anesthesia Evaluation  Patient identified by MRN, date of birth, ID band Patient awake    Reviewed: Allergy & Precautions, NPO status , Patient's Chart, lab work & pertinent test results  History of Anesthesia Complications Negative for: history of anesthetic complications  Airway Mallampati: III   Neck ROM: Full    Dental  (+) Dental Advisory Given, Teeth Intact   Pulmonary COPD,  COPD inhaler, former smoker,    breath sounds clear to auscultation       Cardiovascular + DOE  (-) CAD  Rhythm:Regular Rate:Normal   '20 Cath - Normal coronary arteries LV end diastolic pressure is normal. (Echocardiogram showed normal EF) Normal right heart cath pressures with no evidence pulmonary pretension Normal cardiac output/index  '20 TTE - EF 60-65%. No valvulopathy    Neuro/Psych negative neurological ROS  negative psych ROS   GI/Hepatic Neg liver ROS, GERD  ,  Endo/Other   Obesity   Renal/GU negative Renal ROS     Musculoskeletal  (+) Arthritis , Rheumatoid disorders,    Abdominal   Peds  Hematology negative hematology ROS (+)   Anesthesia Other Findings   Reproductive/Obstetrics                           Anesthesia Physical Anesthesia Plan  ASA: III  Anesthesia Plan: General   Post-op Pain Management:    Induction: Intravenous  PONV Risk Score and Plan: 4 or greater and Treatment may vary due to age or medical condition, Ondansetron and Dexamethasone  Airway Management Planned: Oral ETT and Double Lumen EBT  Additional Equipment: Arterial line, CVP and Ultrasound Guidance Line Placement  Intra-op Plan:   Post-operative Plan: Possible Post-op intubation/ventilation  Informed Consent: I have reviewed the patients History and Physical, chart, labs and discussed the procedure including the risks, benefits and alternatives for the proposed anesthesia with the patient or authorized  representative who has indicated his/her understanding and acceptance.     Dental advisory given  Plan Discussed with: CRNA and Anesthesiologist  Anesthesia Plan Comments:       Anesthesia Quick Evaluation

## 2018-04-19 NOTE — Progress Notes (Addendum)
Anesthesia Chart Review:  Case:  [id number] Date/Time:  04/23/18 1041   Procedures:      VIDEO ASSISTED THORACOSCOPY (VATS)/WEDGE RESECTION (Right Chest)     possible upper LOBECTOMY (Right )   Anesthesia type:  General   Pre-op diagnosis:  RUL NODULE   Location:  [facility] OR ROOM 10 / [place] [facility]   Surgeon:  Melrose Nakayama, MD      DISCUSSION: Patient is a 73 year old female scheduled for the above procedure. She has a hypermetabolic RUL lung nodule.  History includes former smoker (quit 2013), HLD, RA, GERD, CAD (coronary atherosclerosis on CT, but normal coronaries 03/2017 cath), breast cancer (s/p left lumpectomy, radiation 1999). She is s/p cardiac cath on 04/10/18 for what sounded like progressive angina and DOE symptoms, but cath showed normal coronaries and right heat pressures. Symptoms felt likely pulmonary in nature. She was cleared to proceed from a cardiac standpoint per Dr. Ellyn Hack.  If no acute changes then I anticipate that she can proceed as planned. PAT RN contacted [facility] staff regarding abnormal UA results. Defer to surgeon any recommendations. She will need CXR and ABG on the day of surgery.    VS: BP (!) 146/62   Pulse 73   Temp 36.8 C (Oral)   Resp 18   Ht 5' 6.5" (1.689 m)   Wt 100.2 kg   LMP  (LMP Unknown)   SpO2 97%   BMI 35.12 kg/m     PROVIDERS: Jinny Sanders, MD is PCP Glenetta Hew, MD is cardiologist. Last visit 04/18/18. [name], DO is pulmonologist   LABS: Labs reviewed: Acceptable for surgery. UA called to [facility] pe PAT RN. (all labs ordered are listed, but only abnormal results are displayed)  Labs Reviewed  CBC - Abnormal; Notable for the following components:      Result Value   Platelets 419 (*)    All other components within normal limits  COMPREHENSIVE METABOLIC PANEL - Abnormal; Notable for the following components:   Glucose, Bld 121 (*)    All other components within normal limits  URINALYSIS, ROUTINE W REFLEX MICROSCOPIC -  Abnormal; Notable for the following components:   Color, Urine AMBER (*)    APPearance HAZY (*)    Ketones, ur 5 (*)    Leukocytes,Ua LARGE (*)    Bacteria, UA MANY (*)    All other components within normal limits  SURGICAL PCR SCREEN  APTT  PROTIME-INR  BLOOD GAS, ARTERIAL  TYPE AND SCREEN  ABO/RH    PFTs 03/06/18: FVC 2.11 (99%), post 2.36 (111%). FEV1 1.25 (79%), post 1.43 (90%). DLCO unc 21.31 (143%).   IMAGES: PET scan 03/09/18: IMPRESSION: - 9 mm anterior right upper lobe pulmonary nodule identified on recent lung cancer screening CT is markedly hypermetabolic, consistent with neoplasm. No evidence for hypermetabolic lymphadenopathy in the hilar regions or mediastinum. - No other suspicious hypermetabolic disease identified in the neck, chest, abdomen, or pelvis.  CT chest 03/01/18:  IMPRESSION: 1. Lung-RADS 4BS, suspicious. Additional imaging evaluation or consultation with Pulmonology or Thoracic Surgery recommended. 2. The "S" modifier above refers to potentially clinically significant non lung cancer related findings. Specifically, Aortic atherosclerosis, in addition to 2 vessel coronary artery disease. Assessment for potential risk factor modification, dietary therapy or pharmacologic therapy may be warranted, if clinically indicated. 3. Mild diffuse bronchial wall thickening with mild centrilobular and mild-to-moderate paraseptal emphysema; imaging findings suggestive of underlying COPD.   EKG: 04/04/18: NSR   CV: RHC/LHC 04/10/18:  Normal coronary arteries  LV end diastolic pressure is normal. (Echocardiogram showed normal EF)  Normal right heart cath pressures with no evidence pulmonary pretension  Normal cardiac output/index SUMMARY  Angiographically normal coronary arteries  Normal Right Heart Cath Pressures with no evidence of Pulmonary Hypertension  Normal Cardiac Output-Index: 5.9-2.8 --NON-ANGINAL CHEST PAIN AND  DYSPNEA RECOMMENDATIONS  Discharge home after bedrest.  Standard post cath follow-up  Suspect chest pain dyspnea related to pulmonary issue.  No impediment to now proceed with lung surgery.  Echo 04/05/18: IMPRESSIONS  1. The left ventricle has normal systolic function of 01-75%. The cavity size was normal. There is no increased left ventricular wall thickness. Echo evidence of impaired diastolic relaxation and indeterminate left ventricular filling pressure.  2. The right ventricle has normal systolic function. The cavity was normal. There is no increase in right ventricular wall thickness.  3. The mitral valve is normal in structure.  4. The tricuspid valve is normal in structure.  5. The aortic valve is normal in structure.  6. The pulmonic valve was normal in structure.   Past Medical History:  Diagnosis Date  . Breast cancer ([place]) 1999  . Cataract 2017   resolved with surgery  . Coronary artery disease   . GERD (gastroesophageal reflux disease)   . Hyperlipidemia   . Rheumatoid arthritis(714.0)     Past Surgical History:  Procedure Laterality Date  . BREAST LUMPECTOMY  1999   left, needed radiation  . CATARACT EXTRACTION W/ INTRAOCULAR LENS  IMPLANT, BILATERAL Bilateral 05/19/15, 06/02/15  . CHOLECYSTECTOMY    . NM MYOVIEW LTD  01/2016   LOW RISK. Hypertensive response to exercise followed by significant drop (stage I pressure 211/70 drop to 118/75. Stage II). Apical artifact but otherwise no ischemia or infarction. EF 64%.  Marland Kitchen RIGHT/LEFT HEART CATH AND CORONARY ANGIOGRAPHY N/A 04/10/2018   Procedure: RIGHT/LEFT HEART CATH AND CORONARY ANGIOGRAPHY;  Surgeon: Leonie Man, MD;  Location: [facility];; angiographically minimal coronary artery disease.  Normal LVEDP.  Basely normal RHC pressures with no suggestion of pulmonary hypertension.  Normal CO-CI by Fick (5.9-2.8)  . TONSILLECTOMY    . TRANSTHORACIC ECHOCARDIOGRAM  11/'17; 2/'20   a) Nov 2017: Mild LVH. EF  60-65%. GR 2-DD; b) Feb 2020: Normal LV size and function.  EF 60 to 65%.  No R WMA.  GR 1 DD.  Normal RV size and function.  Normal pressures.  No valvular disease.    MEDICATIONS: . aspirin EC 81 MG tablet  . atorvastatin (LIPITOR) 10 MG tablet  . Cholecalciferol (VITAMIN D3) 1.25 MG (50000 UT) CAPS  . Coenzyme Q10 (CO Q-10) 300 MG CAPS  . Fluticasone-Salmeterol (ADVAIR) 250-50 MCG/DOSE AEPB  . metroNIDAZOLE (METROGEL) 1 % gel  . VENTOLIN HFA 108 (90 Base) MCG/ACT inhaler   No current facility-administered medications for this encounter.     Myra Gianotti, PA-C Surgical Short Stay/Anesthesiology [facility] Phone [phone] [facility] Phone [phone] 04/19/2018 4:28 PM

## 2018-04-19 NOTE — Progress Notes (Signed)
PCP - Dr. Eliezer Lofts Cardiologist - Dr. Ellyn Hack Pulmonary- Dr. Valeta Harms  Chest x-ray - Will need DOS EKG - 04/04/18 Stress Test - 02/04/16 ECHO - 04/05/18 Cardiac Cath - 04/10/18  Sleep Study - denies   Aspirin Instructions: Patient states she has been off ASA for upcoming procedure. Instructed to hold all NSAID's, herbal medications, fish oil and vitamins 7 days prior to surgery.   Anesthesia review: cardiac history  Patient denies shortness of breath, fever, cough and chest pain at PAT appointment   Patient verbalized understanding of instructions that were given to them at the PAT appointment. Patient was also instructed that they will need to review over the PAT instructions again at home before surgery.

## 2018-04-23 ENCOUNTER — Inpatient Hospital Stay (HOSPITAL_COMMUNITY): Payer: Medicare HMO

## 2018-04-23 ENCOUNTER — Inpatient Hospital Stay (HOSPITAL_COMMUNITY)
Admission: RE | Admit: 2018-04-23 | Discharge: 2018-05-06 | DRG: 164 | Disposition: A | Discharge status: Home / Self Care | Payer: Medicare HMO | Attending: Thoracic Surgery (Cardiothoracic Vascular Surgery) | Admitting: Thoracic Surgery (Cardiothoracic Vascular Surgery)

## 2018-04-23 ENCOUNTER — Encounter (HOSPITAL_COMMUNITY): Payer: Self-pay | Admitting: *Deleted

## 2018-04-23 ENCOUNTER — Encounter (HOSPITAL_COMMUNITY)
Admission: RE | Disposition: A | Discharge status: Home / Self Care | Payer: Self-pay | Source: Home / Self Care | Attending: Thoracic Surgery (Cardiothoracic Vascular Surgery)

## 2018-04-23 ENCOUNTER — Other Ambulatory Visit: Payer: Self-pay

## 2018-04-23 ENCOUNTER — Inpatient Hospital Stay (HOSPITAL_COMMUNITY): Payer: Medicare HMO | Admitting: Anesthesiology

## 2018-04-23 ENCOUNTER — Inpatient Hospital Stay (HOSPITAL_COMMUNITY): Payer: Medicare HMO | Admitting: Vascular Surgery

## 2018-04-23 DIAGNOSIS — Z9841 Cataract extraction status, right eye: Secondary | ICD-10-CM

## 2018-04-23 DIAGNOSIS — Z9842 Cataract extraction status, left eye: Secondary | ICD-10-CM

## 2018-04-23 DIAGNOSIS — Z9049 Acquired absence of other specified parts of digestive tract: Secondary | ICD-10-CM

## 2018-04-23 DIAGNOSIS — Z87891 Personal history of nicotine dependence: Secondary | ICD-10-CM

## 2018-04-23 DIAGNOSIS — Z853 Personal history of malignant neoplasm of breast: Secondary | ICD-10-CM | POA: Diagnosis not present

## 2018-04-23 DIAGNOSIS — Z01818 Encounter for other preprocedural examination: Secondary | ICD-10-CM | POA: Diagnosis not present

## 2018-04-23 DIAGNOSIS — C3490 Malignant neoplasm of unspecified part of unspecified bronchus or lung: Secondary | ICD-10-CM | POA: Diagnosis not present

## 2018-04-23 DIAGNOSIS — C3491 Malignant neoplasm of unspecified part of right bronchus or lung: Secondary | ICD-10-CM | POA: Diagnosis not present

## 2018-04-23 DIAGNOSIS — K219 Gastro-esophageal reflux disease without esophagitis: Secondary | ICD-10-CM | POA: Diagnosis not present

## 2018-04-23 DIAGNOSIS — R030 Elevated blood-pressure reading, without diagnosis of hypertension: Secondary | ICD-10-CM | POA: Diagnosis not present

## 2018-04-23 DIAGNOSIS — M069 Rheumatoid arthritis, unspecified: Secondary | ICD-10-CM | POA: Diagnosis not present

## 2018-04-23 DIAGNOSIS — Z880 Allergy status to penicillin: Secondary | ICD-10-CM

## 2018-04-23 DIAGNOSIS — Z888 Allergy status to other drugs, medicaments and biological substances status: Secondary | ICD-10-CM | POA: Diagnosis not present

## 2018-04-23 DIAGNOSIS — J9382 Other air leak: Secondary | ICD-10-CM

## 2018-04-23 DIAGNOSIS — J90 Pleural effusion, not elsewhere classified: Secondary | ICD-10-CM | POA: Diagnosis not present

## 2018-04-23 DIAGNOSIS — Z803 Family history of malignant neoplasm of breast: Secondary | ICD-10-CM | POA: Diagnosis not present

## 2018-04-23 DIAGNOSIS — E785 Hyperlipidemia, unspecified: Secondary | ICD-10-CM | POA: Diagnosis not present

## 2018-04-23 DIAGNOSIS — K5900 Constipation, unspecified: Secondary | ICD-10-CM | POA: Diagnosis not present

## 2018-04-23 DIAGNOSIS — J60 Coalworker's pneumoconiosis: Secondary | ICD-10-CM | POA: Diagnosis not present

## 2018-04-23 DIAGNOSIS — C3411 Malignant neoplasm of upper lobe, right bronchus or lung: Secondary | ICD-10-CM | POA: Diagnosis not present

## 2018-04-23 DIAGNOSIS — E559 Vitamin D deficiency, unspecified: Secondary | ICD-10-CM | POA: Diagnosis not present

## 2018-04-23 DIAGNOSIS — J449 Chronic obstructive pulmonary disease, unspecified: Secondary | ICD-10-CM | POA: Diagnosis present

## 2018-04-23 DIAGNOSIS — Z961 Presence of intraocular lens: Secondary | ICD-10-CM | POA: Diagnosis present

## 2018-04-23 DIAGNOSIS — R112 Nausea with vomiting, unspecified: Secondary | ICD-10-CM | POA: Diagnosis not present

## 2018-04-23 DIAGNOSIS — J939 Pneumothorax, unspecified: Secondary | ICD-10-CM | POA: Diagnosis not present

## 2018-04-23 DIAGNOSIS — Z902 Acquired absence of lung [part of]: Secondary | ICD-10-CM

## 2018-04-23 DIAGNOSIS — I898 Other specified noninfective disorders of lymphatic vessels and lymph nodes: Secondary | ICD-10-CM | POA: Diagnosis not present

## 2018-04-23 DIAGNOSIS — I480 Paroxysmal atrial fibrillation: Secondary | ICD-10-CM | POA: Diagnosis not present

## 2018-04-23 DIAGNOSIS — R42 Dizziness and giddiness: Secondary | ICD-10-CM | POA: Diagnosis not present

## 2018-04-23 DIAGNOSIS — I700 Atherosclerosis of aorta: Secondary | ICD-10-CM | POA: Diagnosis not present

## 2018-04-23 DIAGNOSIS — Z79899 Other long term (current) drug therapy: Secondary | ICD-10-CM

## 2018-04-23 DIAGNOSIS — R911 Solitary pulmonary nodule: Secondary | ICD-10-CM

## 2018-04-23 DIAGNOSIS — R0602 Shortness of breath: Secondary | ICD-10-CM | POA: Diagnosis not present

## 2018-04-23 DIAGNOSIS — Z801 Family history of malignant neoplasm of trachea, bronchus and lung: Secondary | ICD-10-CM

## 2018-04-23 DIAGNOSIS — K449 Diaphragmatic hernia without obstruction or gangrene: Secondary | ICD-10-CM | POA: Diagnosis present

## 2018-04-23 DIAGNOSIS — Z85118 Personal history of other malignant neoplasm of bronchus and lung: Secondary | ICD-10-CM

## 2018-04-23 DIAGNOSIS — Z4682 Encounter for fitting and adjustment of non-vascular catheter: Secondary | ICD-10-CM | POA: Diagnosis not present

## 2018-04-23 HISTORY — PX: VIDEO ASSISTED THORACOSCOPY (VATS)/WEDGE RESECTION: SHX6174

## 2018-04-23 HISTORY — PX: NODE DISSECTION: SHX5269

## 2018-04-23 HISTORY — PX: CHEST TUBE INSERTION: SHX231

## 2018-04-23 HISTORY — PX: LOBECTOMY: SHX5089

## 2018-04-23 LAB — BLOOD GAS, ARTERIAL
Acid-Base Excess: 2.5 mmol/L — ABNORMAL HIGH (ref 0.0–2.0)
Bicarbonate: 26.5 mmol/L (ref 20.0–28.0)
Drawn by: 535471
FIO2: 0.21
O2 Saturation: 97.7 %
PATIENT TEMPERATURE: 98.6
PO2 ART: 96.4 mmHg (ref 83.0–108.0)
pCO2 arterial: 41.1 mmHg (ref 32.0–48.0)
pH, Arterial: 7.425 (ref 7.350–7.450)

## 2018-04-23 SURGERY — VIDEO ASSISTED THORACOSCOPY (VATS)/WEDGE RESECTION
Anesthesia: General | Site: Chest | Laterality: Right

## 2018-04-23 MED ORDER — MIDAZOLAM HCL 2 MG/2ML IJ SOLN
1.0000 mg | Freq: Once | INTRAMUSCULAR | Status: AC
  Administered 2018-04-23: 1 mg via INTRAVENOUS

## 2018-04-23 MED ORDER — VANCOMYCIN HCL 10 G IV SOLR
1500.0000 mg | INTRAVENOUS | Status: AC
  Administered 2018-04-23: 1500 mg via INTRAVENOUS
  Filled 2018-04-23: qty 1500

## 2018-04-23 MED ORDER — NALOXONE HCL 0.4 MG/ML IJ SOLN: 0.4000 mg | INTRAMUSCULAR | Status: DC | PRN

## 2018-04-23 MED ORDER — SODIUM CHLORIDE 0.9 % IV SOLN
INTRAVENOUS | Status: DC | PRN
  Administered 2018-04-23: 25 ug/min via INTRAVENOUS

## 2018-04-23 MED ORDER — MORPHINE SULFATE 2 MG/ML IV SOLN
INTRAVENOUS | Status: DC
  Administered 2018-04-23: 14 mg via INTRAVENOUS
  Administered 2018-04-23: 16:00:00 via INTRAVENOUS
  Administered 2018-04-24: 0 mg via INTRAVENOUS
  Administered 2018-04-24: 5 mg via INTRAVENOUS
  Administered 2018-04-24 – 2018-04-25 (×4): 0 mg via INTRAVENOUS
  Filled 2018-04-23: qty 60

## 2018-04-23 MED ORDER — ROCURONIUM BROMIDE 50 MG/5ML IV SOSY
PREFILLED_SYRINGE | INTRAVENOUS | Status: AC
  Filled 2018-04-23: qty 5

## 2018-04-23 MED ORDER — PROPOFOL 10 MG/ML IV BOLUS
INTRAVENOUS | Status: AC
  Filled 2018-04-23: qty 20

## 2018-04-23 MED ORDER — DEXAMETHASONE SODIUM PHOSPHATE 10 MG/ML IJ SOLN
INTRAMUSCULAR | Status: DC | PRN
  Administered 2018-04-23: 10 mg via INTRAVENOUS

## 2018-04-23 MED ORDER — ATORVASTATIN CALCIUM 10 MG PO TABS
10.0000 mg | ORAL_TABLET | Freq: Every day | ORAL | Status: DC
  Administered 2018-04-24 – 2018-05-06 (×13): 10 mg via ORAL
  Filled 2018-04-23 (×13): qty 1

## 2018-04-23 MED ORDER — ROCURONIUM BROMIDE 50 MG/5ML IV SOSY
PREFILLED_SYRINGE | INTRAVENOUS | Status: DC | PRN
  Administered 2018-04-23: 10 mg via INTRAVENOUS
  Administered 2018-04-23: 20 mg via INTRAVENOUS
  Administered 2018-04-23: 50 mg via INTRAVENOUS
  Administered 2018-04-23: 10 mg via INTRAVENOUS
  Administered 2018-04-23: 20 mg via INTRAVENOUS

## 2018-04-23 MED ORDER — LEVALBUTEROL HCL 0.63 MG/3ML IN NEBU
0.6300 mg | INHALATION_SOLUTION | Freq: Four times a day (QID) | RESPIRATORY_TRACT | Status: DC
  Administered 2018-04-23: 0.63 mg via RESPIRATORY_TRACT
  Filled 2018-04-23: qty 3

## 2018-04-23 MED ORDER — ACETAMINOPHEN 500 MG PO TABS
1000.0000 mg | ORAL_TABLET | Freq: Four times a day (QID) | ORAL | Status: AC
  Administered 2018-04-24 – 2018-04-28 (×13): 1000 mg via ORAL
  Filled 2018-04-23 (×13): qty 2

## 2018-04-23 MED ORDER — DEXAMETHASONE SODIUM PHOSPHATE 10 MG/ML IJ SOLN
INTRAMUSCULAR | Status: AC
  Filled 2018-04-23: qty 1

## 2018-04-23 MED ORDER — FENTANYL CITRATE (PF) 250 MCG/5ML IJ SOLN
INTRAMUSCULAR | Status: AC
  Filled 2018-04-23: qty 5

## 2018-04-23 MED ORDER — ACETAMINOPHEN 10 MG/ML IV SOLN
INTRAVENOUS | Status: DC | PRN
  Administered 2018-04-23: 1000 mg via INTRAVENOUS

## 2018-04-23 MED ORDER — PROPOFOL 10 MG/ML IV BOLUS
INTRAVENOUS | Status: DC | PRN
  Administered 2018-04-23: 200 mg via INTRAVENOUS

## 2018-04-23 MED ORDER — SUGAMMADEX SODIUM 200 MG/2ML IV SOLN
INTRAVENOUS | Status: DC | PRN
  Administered 2018-04-23 (×2): 100 mg via INTRAVENOUS

## 2018-04-23 MED ORDER — BUPIVACAINE HCL (PF) 0.5 % IJ SOLN
INTRAMUSCULAR | Status: AC
  Filled 2018-04-23: qty 30

## 2018-04-23 MED ORDER — ONDANSETRON HCL 4 MG/2ML IJ SOLN: 4.0000 mg | Freq: Four times a day (QID) | INTRAMUSCULAR | Status: DC | PRN

## 2018-04-23 MED ORDER — ENOXAPARIN SODIUM 40 MG/0.4ML ~~LOC~~ SOLN
40.0000 mg | SUBCUTANEOUS | Status: DC
  Administered 2018-04-24 – 2018-05-06 (×13): 40 mg via SUBCUTANEOUS
  Filled 2018-04-23 (×13): qty 0.4

## 2018-04-23 MED ORDER — SODIUM CHLORIDE (PF) 0.9 % IJ SOLN
INTRAMUSCULAR | Status: DC | PRN
  Administered 2018-04-23: 50 mL via INTRAVENOUS

## 2018-04-23 MED ORDER — FENTANYL CITRATE (PF) 100 MCG/2ML IJ SOLN
50.0000 ug | Freq: Once | INTRAMUSCULAR | Status: AC
  Administered 2018-04-23: 50 ug via INTRAVENOUS

## 2018-04-23 MED ORDER — FENTANYL CITRATE (PF) 100 MCG/2ML IJ SOLN
INTRAMUSCULAR | Status: AC
  Administered 2018-04-23: 50 ug via INTRAVENOUS
  Filled 2018-04-23: qty 2

## 2018-04-23 MED ORDER — SODIUM CHLORIDE 0.9% FLUSH: 9.0000 mL | INTRAVENOUS | Status: DC | PRN

## 2018-04-23 MED ORDER — LACTATED RINGERS IV SOLN
INTRAVENOUS | Status: DC | PRN
  Administered 2018-04-23: 11:00:00 via INTRAVENOUS

## 2018-04-23 MED ORDER — ONDANSETRON HCL 4 MG/2ML IJ SOLN
INTRAMUSCULAR | Status: DC | PRN
  Administered 2018-04-23: 4 mg via INTRAVENOUS

## 2018-04-23 MED ORDER — ALBUTEROL SULFATE (2.5 MG/3ML) 0.083% IN NEBU: 3.0000 mL | INHALATION_SOLUTION | Freq: Four times a day (QID) | RESPIRATORY_TRACT | Status: DC | PRN

## 2018-04-23 MED ORDER — DEXTROSE-NACL 5-0.9 % IV SOLN
INTRAVENOUS | Status: DC
  Administered 2018-04-23 – 2018-04-24 (×2): via INTRAVENOUS

## 2018-04-23 MED ORDER — VANCOMYCIN HCL IN DEXTROSE 1-5 GM/200ML-% IV SOLN
1000.0000 mg | Freq: Two times a day (BID) | INTRAVENOUS | Status: AC
  Administered 2018-04-23: 1000 mg via INTRAVENOUS
  Filled 2018-04-23: qty 200

## 2018-04-23 MED ORDER — ONDANSETRON HCL 4 MG/2ML IJ SOLN
4.0000 mg | Freq: Four times a day (QID) | INTRAMUSCULAR | Status: DC | PRN
  Administered 2018-04-23 – 2018-04-24 (×3): 4 mg via INTRAVENOUS
  Filled 2018-04-23 (×4): qty 2

## 2018-04-23 MED ORDER — OXYCODONE HCL 5 MG PO TABS: 5.0000 mg | ORAL_TABLET | Freq: Once | ORAL | Status: DC | PRN

## 2018-04-23 MED ORDER — PHENYLEPHRINE 40 MCG/ML (10ML) SYRINGE FOR IV PUSH (FOR BLOOD PRESSURE SUPPORT)
PREFILLED_SYRINGE | INTRAVENOUS | Status: DC | PRN
  Administered 2018-04-23: 80 ug via INTRAVENOUS
  Administered 2018-04-23: 40 ug via INTRAVENOUS

## 2018-04-23 MED ORDER — LIDOCAINE 2% (20 MG/ML) 5 ML SYRINGE
INTRAMUSCULAR | Status: AC
  Filled 2018-04-23: qty 5

## 2018-04-23 MED ORDER — MIDAZOLAM HCL 2 MG/2ML IJ SOLN
INTRAMUSCULAR | Status: AC
  Administered 2018-04-23: 1 mg via INTRAVENOUS
  Filled 2018-04-23: qty 2

## 2018-04-23 MED ORDER — OXYCODONE HCL 5 MG PO TABS: 5.0000 mg | ORAL_TABLET | ORAL | Status: DC | PRN

## 2018-04-23 MED ORDER — TRAMADOL HCL 50 MG PO TABS
50.0000 mg | ORAL_TABLET | Freq: Four times a day (QID) | ORAL | Status: DC | PRN
  Administered 2018-04-24: 50 mg via ORAL
  Administered 2018-04-24: 100 mg via ORAL
  Administered 2018-04-24: 50 mg via ORAL
  Administered 2018-04-25 – 2018-04-30 (×17): 100 mg via ORAL
  Administered 2018-04-30: 50 mg via ORAL
  Administered 2018-05-01 – 2018-05-03 (×4): 100 mg via ORAL
  Administered 2018-05-04: 50 mg via ORAL
  Administered 2018-05-05 (×2): 100 mg via ORAL
  Administered 2018-05-06: 50 mg via ORAL
  Filled 2018-04-23: qty 2
  Filled 2018-04-23: qty 1
  Filled 2018-04-23 (×6): qty 2
  Filled 2018-04-23: qty 1
  Filled 2018-04-23 (×2): qty 2
  Filled 2018-04-23: qty 1
  Filled 2018-04-23 (×3): qty 2
  Filled 2018-04-23 (×2): qty 1
  Filled 2018-04-23 (×13): qty 2
  Filled 2018-04-23: qty 1

## 2018-04-23 MED ORDER — DIPHENHYDRAMINE HCL 50 MG/ML IJ SOLN
12.5000 mg | Freq: Four times a day (QID) | INTRAMUSCULAR | Status: DC | PRN
  Administered 2018-04-24 (×2): 12.5 mg via INTRAVENOUS
  Filled 2018-04-23 (×2): qty 1

## 2018-04-23 MED ORDER — ASPIRIN EC 81 MG PO TBEC
81.0000 mg | DELAYED_RELEASE_TABLET | Freq: Every day | ORAL | Status: DC
  Administered 2018-04-24 – 2018-05-06 (×14): 81 mg via ORAL
  Filled 2018-04-23 (×13): qty 1

## 2018-04-23 MED ORDER — LEVALBUTEROL HCL 0.63 MG/3ML IN NEBU
0.6300 mg | INHALATION_SOLUTION | Freq: Three times a day (TID) | RESPIRATORY_TRACT | Status: DC
  Administered 2018-04-24 – 2018-04-30 (×18): 0.63 mg via RESPIRATORY_TRACT
  Filled 2018-04-23 (×19): qty 3

## 2018-04-23 MED ORDER — ACETAMINOPHEN 10 MG/ML IV SOLN
INTRAVENOUS | Status: AC
  Filled 2018-04-23: qty 100

## 2018-04-23 MED ORDER — ONDANSETRON HCL 4 MG/2ML IJ SOLN
INTRAMUSCULAR | Status: AC
  Filled 2018-04-23: qty 2

## 2018-04-23 MED ORDER — POTASSIUM CHLORIDE 10 MEQ/50ML IV SOLN: 10.0000 meq | Freq: Every day | INTRAVENOUS | Status: DC | PRN

## 2018-04-23 MED ORDER — LIDOCAINE 2% (20 MG/ML) 5 ML SYRINGE
INTRAMUSCULAR | Status: DC | PRN
  Administered 2018-04-23: 60 mg via INTRAVENOUS

## 2018-04-23 MED ORDER — PHENYLEPHRINE 40 MCG/ML (10ML) SYRINGE FOR IV PUSH (FOR BLOOD PRESSURE SUPPORT)
PREFILLED_SYRINGE | INTRAVENOUS | Status: AC
  Filled 2018-04-23: qty 10

## 2018-04-23 MED ORDER — MOMETASONE FURO-FORMOTEROL FUM 200-5 MCG/ACT IN AERO
2.0000 | INHALATION_SPRAY | Freq: Two times a day (BID) | RESPIRATORY_TRACT | Status: DC
  Administered 2018-04-24 – 2018-05-06 (×24): 2 via RESPIRATORY_TRACT
  Filled 2018-04-23: qty 8.8

## 2018-04-23 MED ORDER — BUPIVACAINE LIPOSOME 1.3 % IJ SUSP
INTRAMUSCULAR | Status: DC | PRN
  Administered 2018-04-23: 11:00:00

## 2018-04-23 MED ORDER — FENTANYL CITRATE (PF) 100 MCG/2ML IJ SOLN
INTRAMUSCULAR | Status: DC | PRN
  Administered 2018-04-23: 50 ug via INTRAVENOUS
  Administered 2018-04-23: 100 ug via INTRAVENOUS
  Administered 2018-04-23: 25 ug via INTRAVENOUS
  Administered 2018-04-23: 100 ug via INTRAVENOUS

## 2018-04-23 MED ORDER — ROCURONIUM BROMIDE 50 MG/5ML IV SOSY
PREFILLED_SYRINGE | INTRAVENOUS | Status: AC
  Filled 2018-04-23: qty 10

## 2018-04-23 MED ORDER — 0.9 % SODIUM CHLORIDE (POUR BTL) OPTIME
TOPICAL | Status: DC | PRN
  Administered 2018-04-23: 3000 mL

## 2018-04-23 MED ORDER — ACETAMINOPHEN 160 MG/5ML PO SOLN
1000.0000 mg | Freq: Four times a day (QID) | ORAL | Status: AC
  Administered 2018-04-27 – 2018-04-28 (×3): 1000 mg via ORAL
  Filled 2018-04-23 (×3): qty 40.6

## 2018-04-23 MED ORDER — SENNOSIDES-DOCUSATE SODIUM 8.6-50 MG PO TABS
1.0000 | ORAL_TABLET | Freq: Every day | ORAL | Status: DC
  Administered 2018-04-23 – 2018-05-02 (×7): 1 via ORAL
  Filled 2018-04-23 (×10): qty 1

## 2018-04-23 MED ORDER — DIPHENHYDRAMINE HCL 12.5 MG/5ML PO ELIX
12.5000 mg | ORAL_SOLUTION | Freq: Four times a day (QID) | ORAL | Status: DC | PRN
  Filled 2018-04-23: qty 5

## 2018-04-23 MED ORDER — LACTATED RINGERS IV SOLN
INTRAVENOUS | Status: DC
  Administered 2018-04-23: 09:00:00 via INTRAVENOUS

## 2018-04-23 MED ORDER — ONDANSETRON HCL 4 MG/2ML IJ SOLN: 4.0000 mg | Freq: Once | INTRAMUSCULAR | Status: DC | PRN

## 2018-04-23 MED ORDER — BISACODYL 5 MG PO TBEC
10.0000 mg | DELAYED_RELEASE_TABLET | Freq: Every day | ORAL | Status: DC
  Administered 2018-04-24 – 2018-05-03 (×8): 10 mg via ORAL
  Administered 2018-05-06: 5 mg via ORAL
  Filled 2018-04-23 (×12): qty 2

## 2018-04-23 MED ORDER — OXYCODONE HCL 5 MG/5ML PO SOLN: 5.0000 mg | Freq: Once | ORAL | Status: DC | PRN

## 2018-04-23 MED ORDER — METOCLOPRAMIDE HCL 5 MG/ML IJ SOLN
10.0000 mg | Freq: Four times a day (QID) | INTRAMUSCULAR | Status: AC
  Administered 2018-04-23 – 2018-04-24 (×2): 10 mg via INTRAVENOUS
  Filled 2018-04-23 (×2): qty 2

## 2018-04-23 MED ORDER — FENTANYL CITRATE (PF) 100 MCG/2ML IJ SOLN: 25.0000 ug | INTRAMUSCULAR | Status: DC | PRN

## 2018-04-23 MED ORDER — HEMOSTATIC AGENTS (NO CHARGE) OPTIME
TOPICAL | Status: DC | PRN
  Administered 2018-04-23: 1 via TOPICAL

## 2018-04-23 SURGICAL SUPPLY — 103 items
ADH SKN CLS APL DERMABOND .7 (GAUZE/BANDAGES/DRESSINGS) ×1
ADH SKN CLS LQ APL DERMABOND (GAUZE/BANDAGES/DRESSINGS) ×3
BAG SPEC RTRVL LRG 6X4 10 (ENDOMECHANICALS) ×1
CANISTER SUCT 3000ML PPV (MISCELLANEOUS) ×4 IMPLANT
CATH THORACIC 28FR (CATHETERS) IMPLANT
CATH THORACIC 36FR (CATHETERS) IMPLANT
CATH THORACIC 36FR RT ANG (CATHETERS) IMPLANT
CLIP VESOCCLUDE MED 6/CT (CLIP) ×3 IMPLANT
CONN ST 1/4X3/8  BEN (MISCELLANEOUS) ×1
CONN ST 1/4X3/8 BEN (MISCELLANEOUS) IMPLANT
CONN Y 3/8X3/8X3/8  BEN (MISCELLANEOUS)
CONN Y 3/8X3/8X3/8 BEN (MISCELLANEOUS) IMPLANT
CONT SPEC 4OZ CLIKSEAL STRL BL (MISCELLANEOUS) ×9 IMPLANT
COVER SURGICAL LIGHT HANDLE (MISCELLANEOUS) ×2 IMPLANT
COVER WAND RF STERILE (DRAPES) ×2 IMPLANT
DERMABOND ADHESIVE PROPEN (GAUZE/BANDAGES/DRESSINGS) ×3
DERMABOND ADVANCED (GAUZE/BANDAGES/DRESSINGS) ×1
DERMABOND ADVANCED .7 DNX12 (GAUZE/BANDAGES/DRESSINGS) ×1 IMPLANT
DERMABOND ADVANCED .7 DNX6 (GAUZE/BANDAGES/DRESSINGS) IMPLANT
DRAIN CHANNEL 28F RND 3/8 FF (WOUND CARE) IMPLANT
DRAIN CHANNEL 32F RND 10.7 FF (WOUND CARE) IMPLANT
DRAPE CV SPLIT W-CLR ANES SCRN (DRAPES) ×2 IMPLANT
DRAPE ORTHO SPLIT 77X108 STRL (DRAPES) ×2
DRAPE SURG ORHT 6 SPLT 77X108 (DRAPES) ×1 IMPLANT
DRAPE WARM FLUID 44X44 (DRAPE) ×2 IMPLANT
ELECT BLADE 6.5 EXT (BLADE) ×3 IMPLANT
ELECT REM PT RETURN 9FT ADLT (ELECTROSURGICAL) ×2
ELECTRODE REM PT RTRN 9FT ADLT (ELECTROSURGICAL) ×1 IMPLANT
GAUZE SPONGE 4X4 12PLY STRL (GAUZE/BANDAGES/DRESSINGS) IMPLANT
GAUZE SPONGE 4X4 12PLY STRL LF (GAUZE/BANDAGES/DRESSINGS) ×1 IMPLANT
GLOVE BIO SURGEON STRL SZ 6.5 (GLOVE) ×1 IMPLANT
GLOVE BIOGEL PI IND STRL 6.5 (GLOVE) IMPLANT
GLOVE BIOGEL PI INDICATOR 6.5 (GLOVE) ×1
GLOVE ECLIPSE 6.5 STRL STRAW (GLOVE) ×1 IMPLANT
GLOVE SURG SIGNA 7.5 PF LTX (GLOVE) ×4 IMPLANT
GOWN STRL REUS W/ TWL LRG LVL3 (GOWN DISPOSABLE) ×2 IMPLANT
GOWN STRL REUS W/ TWL XL LVL3 (GOWN DISPOSABLE) ×2 IMPLANT
GOWN STRL REUS W/TWL LRG LVL3 (GOWN DISPOSABLE) ×4
GOWN STRL REUS W/TWL XL LVL3 (GOWN DISPOSABLE) ×4
HEMOSTAT SURGICEL 2X14 (HEMOSTASIS) ×1 IMPLANT
KIT BASIN OR (CUSTOM PROCEDURE TRAY) ×2 IMPLANT
KIT SUCTION CATH 14FR (SUCTIONS) ×2 IMPLANT
KIT TURNOVER KIT B (KITS) ×2 IMPLANT
NDL SPNL 18GX3.5 QUINCKE PK (NEEDLE) IMPLANT
NEEDLE SPNL 18GX3.5 QUINCKE PK (NEEDLE) IMPLANT
NS IRRIG 1000ML POUR BTL (IV SOLUTION) ×6 IMPLANT
PACK CHEST (CUSTOM PROCEDURE TRAY) ×2 IMPLANT
PAD ARMBOARD 7.5X6 YLW CONV (MISCELLANEOUS) ×4 IMPLANT
PENCIL BUTTON HOLSTER BLD 10FT (ELECTRODE) ×1 IMPLANT
POUCH ENDO CATCH II 15MM (MISCELLANEOUS) ×1 IMPLANT
POUCH SPECIMEN RETRIEVAL 10MM (ENDOMECHANICALS) ×1 IMPLANT
RELOAD STAPLE 35X2.5 WHT THIN (STAPLE) IMPLANT
RELOAD STAPLE 60 2.6 WHT THN (STAPLE) IMPLANT
RELOAD STAPLE 60 3.8 GOLD REG (STAPLE) IMPLANT
RELOAD STAPLE 60 4.1 GRN THCK (STAPLE) IMPLANT
RELOAD STAPLER GOLD 60MM (STAPLE) ×13 IMPLANT
RELOAD STAPLER GREEN 60MM (STAPLE) ×2 IMPLANT
RELOAD STAPLER WHITE 60MM (STAPLE) ×1 IMPLANT
SCISSORS ENDO CVD 5DCS (MISCELLANEOUS) IMPLANT
SEALANT PROGEL (MISCELLANEOUS) IMPLANT
SEALANT SURG COSEAL 4ML (VASCULAR PRODUCTS) IMPLANT
SEALANT SURG COSEAL 8ML (VASCULAR PRODUCTS) IMPLANT
SHEARS HARMONIC HDI 20CM (ELECTROSURGICAL) ×1 IMPLANT
SOLUTION ANTI FOG 6CC (MISCELLANEOUS) ×2 IMPLANT
SPECIMEN JAR MEDIUM (MISCELLANEOUS) ×2 IMPLANT
SPONGE INTESTINAL PEANUT (DISPOSABLE) ×6 IMPLANT
SPONGE TONSIL TAPE 1 RFD (DISPOSABLE) ×2 IMPLANT
STAPLE ECHEON FLEX 60 POW ENDO (STAPLE) ×1 IMPLANT
STAPLE RELOAD 2.5MM WHITE (STAPLE) ×22 IMPLANT
STAPLER RELOAD GOLD 60MM (STAPLE) ×26
STAPLER RELOAD GREEN 60MM (STAPLE) ×4
STAPLER RELOAD WHITE 60MM (STAPLE) ×2
STAPLER VASCULAR ECHELON 35 (CUTTER) ×1 IMPLANT
SUT PROLENE 4 0 RB 1 (SUTURE)
SUT PROLENE 4-0 RB1 .5 CRCL 36 (SUTURE) IMPLANT
SUT SILK  1 MH (SUTURE) ×2
SUT SILK 1 MH (SUTURE) ×2 IMPLANT
SUT SILK 1 TIES 10X30 (SUTURE) ×2 IMPLANT
SUT SILK 2 0 SH (SUTURE) IMPLANT
SUT SILK 2 0SH CR/8 30 (SUTURE) IMPLANT
SUT SILK 3 0 SH 30 (SUTURE) IMPLANT
SUT SILK 3 0SH CR/8 30 (SUTURE) ×2 IMPLANT
SUT VIC AB 0 CTX 27 (SUTURE) IMPLANT
SUT VIC AB 1 CTX 27 (SUTURE) ×2 IMPLANT
SUT VIC AB 2-0 CT1 27 (SUTURE)
SUT VIC AB 2-0 CT1 TAPERPNT 27 (SUTURE) IMPLANT
SUT VIC AB 2-0 CTX 36 (SUTURE) ×4 IMPLANT
SUT VIC AB 3-0 MH 27 (SUTURE) IMPLANT
SUT VIC AB 3-0 SH 27 (SUTURE)
SUT VIC AB 3-0 SH 27X BRD (SUTURE) IMPLANT
SUT VIC AB 3-0 X1 27 (SUTURE) ×3 IMPLANT
SUT VICRYL 0 UR6 27IN ABS (SUTURE) ×1 IMPLANT
SUT VICRYL 2 TP 1 (SUTURE) IMPLANT
SYR 30ML LL (SYRINGE) ×2 IMPLANT
SYSTEM SAHARA CHEST DRAIN ATS (WOUND CARE) ×2 IMPLANT
TAPE CLOTH SURG 4X10 WHT LF (GAUZE/BANDAGES/DRESSINGS) ×1 IMPLANT
TIP APPLICATOR SPRAY EXTEND 16 (VASCULAR PRODUCTS) IMPLANT
TOWEL GREEN STERILE (TOWEL DISPOSABLE) ×2 IMPLANT
TOWEL GREEN STERILE FF (TOWEL DISPOSABLE) ×2 IMPLANT
TRAY FOLEY MTR SLVR 16FR STAT (SET/KITS/TRAYS/PACK) ×2 IMPLANT
TROCAR XCEL BLADELESS 5X75MML (TROCAR) ×2 IMPLANT
TROCAR XCEL NON-BLD 5MMX100MML (ENDOMECHANICALS) IMPLANT
WATER STERILE IRR 1000ML POUR (IV SOLUTION) ×4 IMPLANT

## 2018-04-23 NOTE — Transfer of Care (Signed)
Immediate Anesthesia Transfer of Care Note  Patient: Mal Amabile Skillman  Procedure(s) Performed: VIDEO ASSISTED THORACOSCOPY (VATS)/WEDGE RESECTION OF RIGHT UPPER LUNG (Right Chest) RIGHT UPPER LOBECTOMY (Right Chest) Right Lateral Chest Tube Insertion (Right Chest) MEDIASTINAL NODE DISSECTION (Right Chest)  Patient Location: PACU  Anesthesia Type:General  Level of Consciousness: awake, alert  and drowsy  Airway & Oxygen Therapy: Patient Spontanous Breathing and Patient connected to face mask oxygen  Post-op Assessment: Report given to RN and Post -op Vital signs reviewed and stable  Post vital signs: Reviewed and stable  Last Vitals:  Vitals Value Taken Time  BP    Temp    Pulse    Resp    SpO2      Last Pain:  Vitals:   04/23/18 0919  PainSc: 0-No pain      Patients Stated Pain Goal: 3 (17/98/10 2548)  Complications: No apparent anesthesia complications

## 2018-04-23 NOTE — Brief Op Note (Addendum)
04/23/2018  2:54 PM  PATIENT:  Hannah Green  73 y.o. 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)  PROCEDURE:  Procedure(s):  RIGHT VIDEO ASSISTED THORACOSCOPY  WEDGE RESECTION RIGHT UPPER LOBE RIGHT UPPER AND MIDDLE BILOBECTOMY MEDIASTINAL LYMPH NODE DISSECTION INTERCOSTAL NERVE BLOCK   SURGEON:  Surgeon(s) and Role:    * Melrose Nakayama, MD - Primary  PHYSICIAN ASSISTANT: Ellwood Handler PA-C  ANESTHESIA:   general  EBL:  250 mL   BLOOD ADMINISTERED:none  DRAINS: Right Pleural Chest Tube   LOCAL MEDICATIONS USED:  MARCAINE    and BUPIVICAINE   SPECIMEN:  Source of Specimen:  Wedge Resection RUL, RUL, RML  DISPOSITION OF SPECIMEN:  PATHOLOGY  COUNTS:  YES  TOURNIQUET:  * No tourniquets in log *  DICTATION: .Dragon Dictation  PLAN OF CARE: Admit to inpatient   PATIENT DISPOSITION:  PACU - hemodynamically stable.   Delay start of Pharmacological VTE agent (>24hrs) due to surgical blood loss or risk of bleeding: no

## 2018-04-23 NOTE — Plan of Care (Signed)
  Problem: Education: Goal: Knowledge of disease or condition will improve Outcome: Progressing Goal: Knowledge of the prescribed therapeutic regimen will improve Outcome: Progressing   Problem: Activity: Goal: Risk for activity intolerance will decrease Outcome: Progressing   Problem: Cardiac: Goal: Will achieve and/or maintain hemodynamic stability Outcome: Progressing   Problem: Clinical Measurements: Goal: Postoperative complications will be avoided or minimized Outcome: Progressing   Problem: Respiratory: Goal: Respiratory status will improve Outcome: Progressing   Problem: Pain Management: Goal: Pain level will decrease Outcome: Progressing   Problem: Skin Integrity: Goal: Wound healing without signs and symptoms infection will improve Outcome: Progressing

## 2018-04-23 NOTE — Op Note (Signed)
NAME: Hannah Green, Hannah Green MEDICAL RECORD [id number] ACCOUNT [id number] DATE OF BIRTH:1945/05/06 FACILITY: [facility] LOCATION: [facility] PHYSICIAN:Araiyah Cumpton Chaya Jan, MD  OPERATIVE REPORT  DATE OF PROCEDURE:  04/23/2018  PREOPERATIVE DIAGNOSIS:  Right upper lobe nodule, suspected non-small cell lung cancer.  POSTOPERATIVE DIAGNOSIS:  Nonsmall cell carcinoma, clinical stage IA (T1, N0).  PROCEDURE:   Right video-assisted thoracoscopy, Wedge resection,  Right upper and middle bilobectomy, Mediastinal lymph node dissection, Intercostal nerve block.  SURGEON:  Modesto Charon, MD  ASSISTANT:  Ellwood Handler, PA  ANESTHESIA:  General.  FINDINGS:  Nodule along area of minor fissure.  Minor fissure incomplete.  Frozen section revealed nonsmall cell carcinoma.  Bronchial margin negative for tumor.  Normal appearing lymph nodes.  CLINICAL NOTE:  The patient is a 73 year old woman with a history of tobacco abuse who was found to have a 5 mm nodule in the right upper lobe in December of 2018.  A followup showed an increase in size. On PET CT the nodule had marked hypermetabolic  activity with an SUV of 7.  There was no evidence of regional or distant metastases.  She was advised to undergo wedge resection for definitive diagnosis and a lobectomy for definitive treatment if the nodule turned out to be a cancer.  She had a cardiac  workup and was cleared to proceed with surgery.  The indications, risks, benefits, and alternatives were discussed in detail with the patient.  She understood and accepted the risks and agreed to proceed.  OPERATIVE NOTE:  The patient was brought to the preoperative holding area on 04/23/2018.  Anesthesia placed a central venous line and an arterial blood pressure monitor line.  She was taken to the operating room, anesthetized and intubated with a double  lumen endotracheal tube.  A Foley catheter was placed.  Intravenous antibiotics were administered.  Sequential  compression devices were placed on the calves for DVT prophylaxis.  She was placed in a left lateral decubitus position and the right chest was  prepped and draped in the usual sterile fashion.  Single lung ventilation of the left lung was initiated.  Oxygen saturations dropped into the mid 80s but thereafter were stable throughout the procedure.  A timeout was performed.  A solution containing 20 mL of liposomal bupivacaine, 30 mL of 0.5% bupivacaine and 50 mL of saline was prepared.  This was used for local anesthesia at the incisions and also used for the intercostal nerve blocks.  An incision was made in the seventh interspace in the mid axillary line.  A 5 mm port was inserted into the chest.  The thoracoscope was advanced into the chest.  There was good isolation of the right lung, although it was relatively slow to deflate.  A  site was selected for incision in the fourth interspace anterolaterally.  This area was injected with the bupivacaine solution and then a 5 cm working incision was made.  No rib spreading was performed during the procedure.   Intercostal nerve blocks were performed from the third to the ninth interspaces.  A needle was inserted from a posterior approach and 10 mL of the bupivacaine solution was injected into a subpleural plane at each of those interspaces.  The minor fissure was incomplete.  The right upper lobe had significant emphysema.  The nodule was palpable in the anterolateral portion of the right upper lobe in an area where there was no apparent fissure.  A wedge resection was performed with  sequential firings of a 60 mm Echelon  stapler using gold cartridges.  The specimen was placed into an endoscopic retrieval bag, removed and sent for frozen section, which returned showing nonsmall cell carcinoma.  The decision was made to proceed with  lobectomy.  The inferior ligament was divided with a Harmonic scalpel and the pleural reflection was divided at the hilum  anteriorly, posteriorly and superiorly.  Nodes that were encountered during the dissection were removed and sent as separate  specimens for permanent pathology.  All nodes appeared grossly normal.  The fissures were incomplete.  The right superior pulmonary vein was identified.  The middle lobe branch was identified.  An attempt was made to create the minor fissure with sequential firings of the Echelon stapler.  However, it became clear that in order to achieve an adequate gross margin from the previous staple line, there would not be functional  middle lobe due to its relatively small size.  The decision was made to proceed with a bilobectomy.  The upper lobe branches of the right superior pulmonary vein were dissected out, encircled and divided with the vascular stapler.  That actually was done before the decision was  made to proceed with the bilobectomy. The middle lobe vein was later dissected out and divided separately.  When dividing the vein branches, a second port incision was made anterior to the first for passage of the vascular stapler.  The dissection was  carried superiorly.  The main pulmonary artery was identified.  There were 4 branches to the upper lobe.  The anterior branch was relatively small.  It was dissected out and divided with the vascular stapler.  Then, the larger more apical branch was  encircled and divided as well.  As the dissection was carried along the main pulmonary artery, there was another branch that was initially thought to be the posterior ascending branch.  To get the stapler across this branch safely, it had to be placed  from a posterior approach and another port incision was made posteriorly.  The stapler then was advanced, placed across this branch of the pulmonary artery and it was divided.  After dividing that, it was clear that there was another branch that was the  posterior branch.  This vessel was divided with the stapler as well.  As the dissection  continued along the main pulmonary artery, the medial branch of the middle lobe artery was approached.  There was bleeding.  Surgicel was applied to this area and  pressure was held on this area and the bleeding stopped.  The major fissure between the middle lobe and lower lobe then was completed with sequential firings of the Echelon stapler.  The middle lobe vein had previously been divided with the stapler.  The  middle lobe bronchus was dissected out.  The Echelon stapler with a green cartridge was placed from the posterior port incision across the middle lobe bronchus at its origin and closed.  A test inflation showed good aeration of the upper and lower  lobes.  The stapler was fired transecting the middle lobe bronchus.  The middle lobe arterial branches then were easily encircled and divided with the vascular stapler again from the posterior approach.  The pulmonary artery branch to the superior  segment of the lower lobe was identified and the dissection was carried along the lateral surface of this.  The major fissure was completed between the upper lobe and the superior segment with sequential firings of the Echelon stapler.  The upper lobe  bronchus then was encircled.  The stapler was placed across the upper lobe and closed.  A test inflation showed good aeration of the lower lobe.  The stapler was fired, transecting the bronchus.  The upper and middle lobes were placed into an endoscopic  retrieval bag, removed and sent for frozen section of the bronchial margin, which returned with no tumor seen.  Level 7 and 4R nodes then were removed.  Again, these appeared relatively normal grossly.  The chest was copiously irrigated with warm saline.   A test inflation to 30 cm of water revealed no leakage from the bronchial stumps and no significant leakage from the staple line.  A 28-French chest tube was placed through the anterior most port incision and secured to skin with a #1 silk suture.    Dual-lung ventilation was resumed.  The 2 remaining port incisions were closed in standard fashion with subcuticular skin closures.  The working incision was closed in 3 layers again using a subcuticular closure for the skin.  All sponge, needle and  instrument counts were correct at the end of the procedure.  The patient was placed back in the supine position.  The chest tube was placed to suction.  She was extubated in the operating room and taken to the [facility] in good condition.  TN/NUANCE  D:04/23/2018 T:04/23/2018 JOB:[id number]

## 2018-04-23 NOTE — Anesthesia Procedure Notes (Addendum)
Central Venous Catheter Insertion Performed by: Audry Pili, MD, anesthesiologist Start/End2/24/2020 10:39 AM, 04/23/2018 10:50 AM Patient location: Pre-op. Preanesthetic checklist: patient identified, IV checked, risks and benefits discussed, surgical consent, monitors and equipment checked, pre-op evaluation, timeout performed and anesthesia consent Position: Trendelenburg Lidocaine 1% used for infiltration and patient sedated Hand hygiene performed , maximum sterile barriers used  and Seldinger technique used Catheter size: 8.5 Fr Central line was placed.Double lumen Procedure performed using ultrasound guided technique. Ultrasound Notes:anatomy identified, needle tip was noted to be adjacent to the nerve/plexus identified, no ultrasound evidence of intravascular and/or intraneural injection and image(s) printed for medical record Attempts: 1 Following insertion, line sutured, dressing applied and Biopatch. Post procedure assessment: blood return through all ports, free fluid flow and no air  Patient tolerated the procedure well with no immediate complications.

## 2018-04-23 NOTE — Anesthesia Procedure Notes (Signed)
Arterial Line Insertion Start/End2/24/2020 9:50 AM, 04/23/2018 10:10 AM Performed by: Leonor Liv, CRNA  Patient location: Pre-op. Preanesthetic checklist: patient identified, IV checked, site marked, risks and benefits discussed, surgical consent, monitors and equipment checked, pre-op evaluation, timeout performed and anesthesia consent Lidocaine 1% used for infiltration Left, radial was placed Catheter size: 20 G Hand hygiene performed  and maximum sterile barriers used  Allen's test indicative of satisfactory collateral circulation Attempts: 1 Procedure performed without using ultrasound guided technique. Following insertion, dressing applied and Biopatch. Post procedure assessment: normal  Patient tolerated the procedure well with no immediate complications.

## 2018-04-23 NOTE — Interval H&P Note (Signed)
History and Physical Interval Note: Cardiac workup OK- will proceed 04/23/2018 10:30 AM  Hannah Green  has presented today for surgery, with the diagnosis of RUL NODULE  The various methods of treatment have been discussed with the patient and family. After consideration of risks, benefits and other options for treatment, the patient has consented to  Procedure(s): VIDEO ASSISTED THORACOSCOPY (VATS)/WEDGE RESECTION (Right) possible upper LOBECTOMY (Right) as a surgical intervention .  The patient's history has been reviewed, patient examined, no change in status, stable for surgery.  I have reviewed the patient's chart and labs.  Questions were answered to the patient's satisfaction.     Melrose Nakayama

## 2018-04-23 NOTE — Anesthesia Procedure Notes (Addendum)
Procedure Name: Intubation Date/Time: 04/23/2018 11:26 AM Performed by: Audry Pili, MD Pre-anesthesia Checklist: Patient identified, Emergency Drugs available, Suction available and Patient being monitored Patient Re-evaluated:Patient Re-evaluated prior to induction Oxygen Delivery Method: Circle system utilized Preoxygenation: Pre-oxygenation with 100% oxygen Induction Type: IV induction Ventilation: Mask ventilation without difficulty Laryngoscope Size: Miller and 2 Grade View: Grade I Tube type: Oral Endobronchial tube: Double lumen EBT, EBT position confirmed by auscultation and EBT position confirmed by fiberoptic bronchoscope and 37 Fr Number of attempts: 2 Airway Equipment and Method: Stylet Placement Confirmation: ETT inserted through vocal cords under direct vision,  positive ETCO2 and breath sounds checked- equal and bilateral Secured at: 30 cm Tube secured with: Tape Dental Injury: Teeth and Oropharynx as per pre-operative assessment

## 2018-04-23 NOTE — Anesthesia Postprocedure Evaluation (Signed)
Anesthesia Post Note  Patient: Hannah Green  Procedure(s) Performed: VIDEO ASSISTED THORACOSCOPY (VATS)/WEDGE RESECTION OF RIGHT UPPER LUNG (Right Chest) RIGHT UPPER LOBECTOMY (Right Chest) Right Lateral Chest Tube Insertion (Right Chest) MEDIASTINAL NODE DISSECTION (Right Chest)     Patient location during evaluation: PACU Anesthesia Type: General Level of consciousness: awake and alert Pain management: pain level controlled Vital Signs Assessment: post-procedure vital signs reviewed and stable Respiratory status: spontaneous breathing, nonlabored ventilation, respiratory function stable and patient connected to nasal cannula oxygen Cardiovascular status: blood pressure returned to baseline and stable Postop Assessment: no apparent nausea or vomiting Anesthetic complications: no    Last Vitals:  Vitals:   04/23/18 1600 04/23/18 1615  BP: 128/63 (!) 142/67  Pulse: 69 62  Resp: 13 14  Temp:    SpO2: 100% 100%    Last Pain:  Vitals:   04/23/18 1543  PainSc: 0-No pain                 Audry Pili

## 2018-04-24 ENCOUNTER — Inpatient Hospital Stay (HOSPITAL_COMMUNITY): Payer: Medicare HMO

## 2018-04-24 ENCOUNTER — Encounter (HOSPITAL_COMMUNITY): Payer: Self-pay | Admitting: Thoracic Surgery (Cardiothoracic Vascular Surgery)

## 2018-04-24 DIAGNOSIS — Z85118 Personal history of other malignant neoplasm of bronchus and lung: Secondary | ICD-10-CM

## 2018-04-24 DIAGNOSIS — C3490 Malignant neoplasm of unspecified part of unspecified bronchus or lung: Secondary | ICD-10-CM

## 2018-04-24 LAB — BASIC METABOLIC PANEL
Anion gap: 9 (ref 5–15)
BUN: 11 mg/dL (ref 8–23)
CALCIUM: 7.9 mg/dL — AB (ref 8.9–10.3)
CO2: 24 mmol/L (ref 22–32)
Chloride: 104 mmol/L (ref 98–111)
Creatinine, Ser: 0.6 mg/dL (ref 0.44–1.00)
GFR calc Af Amer: >60 mL/min (ref >60)
GFR calc non Af Amer: >60 mL/min (ref >60)
Glucose, Bld: 171 mg/dL — ABNORMAL HIGH (ref 70–99)
Potassium: 3.9 mmol/L (ref 3.5–5.1)
Sodium: 137 mmol/L (ref 135–145)

## 2018-04-24 LAB — BLOOD GAS, ARTERIAL
Acid-Base Excess: 1.3 mmol/L (ref 0.0–2.0)
Bicarbonate: 26.3 mmol/L (ref 20.0–28.0)
O2 Content: 2 L/min
O2 SAT: 98.6 %
Patient temperature: 98.6
pCO2 arterial: 48.4 mmHg — ABNORMAL HIGH (ref 32.0–48.0)
pH, Arterial: 7.354 (ref 7.350–7.450)
pO2, Arterial: 129 mmHg — ABNORMAL HIGH (ref 83.0–108.0)

## 2018-04-24 LAB — CBC
HCT: 35.3 % — ABNORMAL LOW (ref 36.0–46.0)
Hemoglobin: 11.6 g/dL — ABNORMAL LOW (ref 12.0–15.0)
MCH: 31.1 pg (ref 26.0–34.0)
MCHC: 32.9 g/dL (ref 30.0–36.0)
MCV: 94.6 fL (ref 80.0–100.0)
PLATELETS: 329 10*3/uL (ref 150–400)
RBC: 3.73 MIL/uL — ABNORMAL LOW (ref 3.87–5.11)
RDW: 13 % (ref 11.5–15.5)
WBC: 15 10*3/uL — ABNORMAL HIGH (ref 4.0–10.5)
nRBC: 0 % (ref 0.0–0.2)

## 2018-04-24 MED ORDER — WHITE PETROLATUM EX OINT
TOPICAL_OINTMENT | CUTANEOUS | Status: AC
  Filled 2018-04-24: qty 28.35

## 2018-04-24 MED ORDER — METRONIDAZOLE 0.75 % EX GEL
1.0000 "application " | Freq: Every day | CUTANEOUS | Status: DC | PRN
  Filled 2018-04-24: qty 45

## 2018-04-24 NOTE — Progress Notes (Signed)
IS and Flutter left at bedside. Will begin in am.

## 2018-04-24 NOTE — Discharge Summary (Addendum)
Physician Discharge Summary  Patient ID: Hannah Green MRN: [id number] DOB/AGE: 1945/11/03 73 y.o.  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 Non-small cell lung cancer ([place]) 04/24/2018  . S/P Right Upper Lobectomy, Right Middle Lobectomy Lung 04/23/2018  . Pain and swelling of wrist, right 04/18/2018  . Exertional chest pain 04/04/2018  . DOE (dyspnea on exertion) 04/04/2018  . Pulmonary nodule 03/05/2018  . Tobacco abuse counseling 03/05/2018  . Prediabetes 01/08/2018  . COPD exacerbation ([place]) 04/27/2017  . COPD (chronic obstructive pulmonary disease) ([place]) 12/07/2016  . Chest pain with moderate risk for cardiac etiology 02/01/2016  . 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 Osteopenia of femoral neck 01/12/2016  . Rosacea 12/22/2015  . Cough 12/03/2015  . Constipation, chronic 11/11/2014  . Fatigue 11/11/2014  . Basal cell carcinoma of left side of nose 06/01/2012  . Alopecia 06/01/2012  . Routine general medical examination at a health care facility 06/02/2011  . GERD (gastroesophageal reflux disease)   . Vitamin D deficiency 02/13/2008  . ADENOCARCINOMA, BREAST 02/08/2008  . Hyperlipidemia with target LDL less than 100 02/08/2008  . 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 Non-small cell lung cancer ([place]) 04/24/2018  . S/P Right Upper Lobectomy, Right Middle Lobectomy Lung 04/23/2018  . Pain and swelling of wrist, right 04/18/2018  . Exertional chest pain 04/04/2018  . DOE (dyspnea on exertion) 04/04/2018  . Pulmonary nodule 03/05/2018  . Tobacco abuse counseling 03/05/2018  . Prediabetes  01/08/2018  . COPD exacerbation ([place]) 04/27/2017  . COPD (chronic obstructive pulmonary disease) ([place]) 12/07/2016  . Chest pain with moderate risk for cardiac etiology 02/01/2016  . 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 Osteopenia of femoral neck 01/12/2016  . Rosacea 12/22/2015  . Cough 12/03/2015  . Constipation, chronic 11/11/2014  . Fatigue 11/11/2014  . Basal cell carcinoma of left side of nose 06/01/2012  . Alopecia 06/01/2012  . Routine general medical examination at a health care facility 06/02/2011  . GERD (gastroesophageal reflux disease)   . Vitamin D deficiency 02/13/2008  . ADENOCARCINOMA, BREAST 02/08/2008  . Hyperlipidemia with target LDL less than 100 02/08/2008  . RHEUMATOID ARTHRITIS 02/08/2008     Discharged Condition: stable  History of Present Illness: Ms. Hannah Green is a 73 year old female with history of breast cancer diagnosed and treated in 1999, COPD, 75-pack-year history of tobacco use, rheumatoid arthritis, gastroesophageal reflux disease, and diastolic cardiac dysfunction without heart failure.  By way of her participation in the lung cancer screening program, she was found to have a 5 mm right upper lobe subpleural nodule in December 2018.  Follow-up CT chest scan obtained more recently showed this nodule to have increased in size to 10 mm.  She went on to have a PET CT that revealed hypermetabolic activity within the lesion with an SUV measured at 7.  There was no evidence of regional or distant metastasis.  She was referred to Dr. Roxan Hockey for surgical intervention.  After outpatient evaluation, he recommended surgical resection.  Cardiac clearance was obtained.  Hospital Course:  Ms. Hannah Green was admitted to the hospital for same-day surgery on 04/23/2018.  She was taken to the operating room  where right video-assisted thoracoscopy was carried out with wedge resection of the right upper lobe lesion.  Frozen section obtained  intraoperatively confirmed non-small cell carcinoma.  Dr. Koleen Nimrod then proceeded with right upper and middle bilobectomy with mediastinal lymph node sampling and intercostal nerve block.  She tolerated the procedure well.  She was extubated following surgery and transferred to the postanesthesia care unit and later to the transitional care unit.  He remained hemodynamically stable.  A line and foley were removed early in her post operative coarse. She went into a fib on 02/28 and was put on Amiodarone. She converted to sinus rhythm. She was hypertensive and this improved with Lopressor and HCTZ. She had a persistent air leak. Daily chest x rays were obtained and remained stable. Chest tube was placed to water seal on 03/02. She had nausea, vomiting, and dizziness. Oxy was stopped and she was given Zofran and BRAT diet. These symptoms did improve. The air leak resolved allowing for CT removal on 05/05/18.  Her respiratory status and CXR remained stable.   Significant Diagnostic Studies:   : CT CHEST WITHOUT CONTRAST LOW-DOSE FOR LUNG CANCER SCREENING  TECHNIQUE: Multidetector CT imaging of the chest was performed following the standard protocol without IV contrast.  COMPARISON:  Low-dose lung cancer screening chest CT 02/01/2017.  FINDINGS: Cardiovascular: Heart size is normal. There is no significant pericardial fluid, thickening or pericardial calcification. There is aortic atherosclerosis, as well as atherosclerosis of the great vessels of the mediastinum and the coronary arteries, including calcified atherosclerotic plaque in the left anterior descending and right coronary arteries.  Mediastinum/Nodes: No pathologically enlarged mediastinal or hilar lymph nodes. Moderate-sized hiatal hernia. No axillary lymphadenopathy. Surgical clips in the right axillary region from prior lymph node dissection.  Lungs/Pleura: Multiple small pulmonary nodules are noted in the lungs bilaterally.  Although several of these are stable compared to the prior study, one has clearly enlarged. Specifically, in the anterior aspect of the right upper lobe immediately above the minor fissure (axial image 133 of series 3), with a volume derived mean diameter 9.0 mm. No acute consolidative airspace disease. No pleural effusions. Diffuse bronchial wall thickening with mild centrilobular and mild-to-moderate paraseptal emphysema.  Upper Abdomen: Aortic atherosclerosis.  Musculoskeletal: There are no aggressive appearing lytic or blastic lesions noted in the visualized portions of the skeleton.  IMPRESSION: 1. Lung-RADS 4BS, suspicious. Additional imaging evaluation or consultation with Pulmonology or Thoracic Surgery recommended. 2. The "S" modifier above refers to potentially clinically significant non lung cancer related findings. Specifically, Aortic atherosclerosis, in addition to 2 vessel coronary artery disease. Assessment for potential risk factor modification, dietary therapy or pharmacologic therapy may be warranted, if clinically indicated. 3. Mild diffuse bronchial wall thickening with mild centrilobular and mild-to-moderate paraseptal emphysema; imaging findings suggestive of underlying COPD.  Aortic Atherosclerosis (ICD10-I70.0) and Emphysema (ICD10-J43.9).   Electronically Signed   By: Vinnie Langton M.D.   On: 03/01/2018 11:30   EXAM: NUCLEAR MEDICINE PET SKULL BASE TO THIGH  TECHNIQUE: 11.2 mCi F-18 FDG was injected intravenously. Full-ring PET imaging was performed from the skull base to thigh after the radiotracer. CT data was obtained and used for attenuation correction and anatomic localization.  Fasting blood glucose: 104 mg/dl  COMPARISON:  Lung cancer screening CT 03/01/2018  FINDINGS: Mediastinal blood pool activity: SUV max 2.9  NECK: No hypermetabolic lymph nodes in the neck.  Incidental CT findings: none  CHEST: The anterior right upper  lobe nodule seen to be enlarging on recent  lung cancer screening CT is markedly hypermetabolic with SUV max = 7. Given this level of hypermetabolism relative to the small size of the nodule, features are highly suspicious for malignancy. No evidence for hypermetabolic lymphadenopathy in the mediastinum or either hilum above background blood pool uptake. No mediastinal or hilar lymphadenopathy by CT size criteria.  No other hypermetabolic pulmonary nodule evident.  Incidental CT findings: Changes of emphysema noted in the lungs bilaterally. The multiple small pulmonary nodule seen on recent screening CT are less well demonstrated on today's non breath hold exam.  ABDOMEN/PELVIS: No abnormal hypermetabolic activity within the liver, pancreas, adrenal glands, or spleen. No hypermetabolic lymph nodes in the abdomen or pelvis.  Incidental CT findings: Small to moderate hiatal hernia. Gallbladder surgically absent. There is abdominal aortic atherosclerosis without aneurysm. Diffuse diverticular changes are noted in the colon without diverticulitis.  SKELETON: No focal hypermetabolic activity to suggest skeletal metastasis.  Incidental CT findings: none  IMPRESSION: 9 mm anterior right upper lobe pulmonary nodule identified on recent lung cancer screening CT is markedly hypermetabolic, consistent with neoplasm. No evidence for hypermetabolic lymphadenopathy in the hilar regions or mediastinum.  No other suspicious hypermetabolic disease identified in the neck, chest, abdomen, or pelvis.   Electronically Signed   By: Misty Stanley M.D.   On: 03/09/2018 19:01     Treatments: Surgery  OPERATIVE REPORT  DATE OF PROCEDURE:  04/23/2018  PREOPERATIVE DIAGNOSIS:  Right upper lobe nodule, suspected non-small cell lung cancer.  POSTOPERATIVE DIAGNOSIS:  Nonsmall cell carcinoma, clinical stage IA (T1, N0).  PROCEDURE:  Right video-assisted thoracoscopy, wedge  resection, right upper and middle bilobectomy, mediastinal lymph node dissection and intercostal nerve block.  SURGEON:  Modesto Charon, MD  ASSISTANT:  Ellwood Handler, PA  ANESTHESIA:  General.  FINDINGS:  Nodule along area of minor fissure.  Minor fissure incomplete.  Frozen section revealed nonsmall cell carcinoma.  Bronchial margin negative for tumor.  Normal appearing lymph nodes.  Discharge Exam: Blood pressure (!) 154/59, pulse 86, temperature 98 F (36.7 C), temperature source Oral, resp. rate 16, height 5' 6.5" (1.689 m), weight 100.2 kg, SpO2 93 %.  General appearance:alert, cooperative and no distress Neurologic:intact Heart:regular rate and rhythm Lungs:diminished breath soundsright base Wound:clean and dry, there is some erythema and induration around the CT insertion site.  Disposition:    Allergies as of 05/06/2018      Reactions   Adhesive [tape] Rash   Paper tape causes rash   Neosporin [neomycin-bacitracin Zn-polymyx] Itching   Penicillins Rash   Did it involve swelling of the face/tongue/throat, SOB, or low BP? No Did it involve sudden or severe rash/hives, skin peeling, or any reaction on the inside of your mouth or nose? Yes Did you need to seek medical attention at a hospital or doctor's office? Yes When did it last happen?childhood allergy If all above answers are "NO", may proceed with cephalosporin use.      Medication List    TAKE these medications   aspirin EC 81 MG tablet Take 1 tablet (81 mg total) by mouth daily. What changed:  when to take this   atorvastatin 10 MG tablet Commonly known as:  LIPITOR Take 1 tablet (10 mg total) by mouth daily.   Co Q-10 300 MG Caps Take 300 mg by mouth daily.   Fluticasone-Salmeterol 250-50 MCG/DOSE Aepb Commonly known as:  ADVAIR Inhale 1 puff into the lungs every 12 (twelve) hours.   hydrochlorothiazide 12.5 MG capsule Commonly known as:  MICROZIDE Take  1 capsule (12.5 mg total)  by mouth daily. Start taking on:  May 07, 2018   levofloxacin 500 MG tablet Commonly known as:  Levaquin Take 1 tablet (500 mg total) by mouth daily for 5 days.   metoprolol tartrate 25 MG tablet Commonly known as:  LOPRESSOR Take 1 tablet (25 mg total) by mouth 2 (two) times daily.   metroNIDAZOLE 1 % gel Commonly known as:  Metrogel Apply topically daily. What changed:    how much to take  when to take this  reasons to take this   traMADol 50 MG tablet Commonly known as:  ULTRAM Take 1-2 tablets (50-100 mg total) by mouth every 6 (six) hours as needed for up to 5 days (mild pain).   Ventolin HFA 108 (90 Base) MCG/ACT inhaler Generic drug:  albuterol TAKE 2 PUFFS BY MOUTH EVERY 6 HOURS AS NEEDED FOR WHEEZE OR SHORTNESS OF BREATH What changed:  See the new instructions.   Vitamin D3 1.25 MG (50000 UT) Caps TAKE 1 CAPSULE BY MOUTH ONE TIME PER WEEK What changed:  See the new instructions.      Follow-up Information    Leonie Man, MD. Go to.   Specialty:  Cardiology Why:  Wednseday March 11, 10:40am. Contact information: [address] [place] [address] [phone]        Melrose Nakayama, MD Follow up on 05/15/2018.   Specialty:  Cardiothoracic Surgery Why:  You have an appointment scheduled with Dr. Roxan Hockey on Tuesday, March 17th at 4:15pm.  Please arrive 30 minutes early for a chest Xray  on the first floor of the same building. Contact information: [address] [place] [address] [phone]        [name] L, DO. Go to.   Specialty:  Pulmonary Disease Why:  You have an appointment scheduled for Wednesday 05/16/2018 at  9:30am. Contact information: [place] [address] [phone]           Signed: Antony Odea PA-C 05/06/2018, 10:21 AM

## 2018-04-24 NOTE — Progress Notes (Signed)
1 Day Post-Op Procedure(s) (LRB): VIDEO ASSISTED THORACOSCOPY (VATS)/WEDGE RESECTION OF RIGHT UPPER LUNG (Right) RIGHT UPPER LOBECTOMY (Right) Right Lateral Chest Tube Insertion (Right) MEDIASTINAL NODE DISSECTION (Right) Subjective: Nausea and vomiting last night No nausea this AM, incisional pain  Objective: Vital signs in last 24 hours: Temp:  [97.5 F (36.4 C)-98.4 F (36.9 C)] 98 F (36.7 C) (02/25 0757) Pulse Rate:  [62-81] 81 (02/25 0757) Cardiac Rhythm: Normal sinus rhythm (02/25 0700) Resp:  [11-21] 14 (02/25 0757) BP: (123-157)/(49-67) 134/49 (02/25 0757) SpO2:  [93 %-100 %] 97 % (02/25 0757) Arterial Line BP: (65-119)/(46-59) 71/59 (02/24 1815) Weight:  [100.2 kg] 100.2 kg (02/24 0919)  Hemodynamic parameters for last 24 hours:    Intake/Output from previous day: 02/24 0701 - 02/25 0700 In: 1000 [I.V.:700] Out: 1194 [Urine:850; Blood:250; Chest Tube:94] Intake/Output this shift: No intake/output data recorded.  General appearance: alert, cooperative and no distress + air leak  Lab Results: Recent Labs    04/24/18 0500  WBC 15.0*  HGB 11.6*  HCT 35.3*  PLT 329   BMET:  Recent Labs    04/24/18 0500  NA 137  K 3.9  CL 104  CO2 24  GLUCOSE 171*  BUN 11  CREATININE 0.60  CALCIUM 7.9*    PT/INR: No results for input(s): LABPROT, INR in the last 72 hours. ABG    Component Value Date/Time   PHART 7.354 04/24/2018 0555   HCO3 26.3 04/24/2018 0555   TCO2 31 04/10/2018 1118   O2SAT 98.6 04/24/2018 0555   CBG (last 3)  No results for input(s): GLUCAP in the last 72 hours.  Assessment/Plan: S/P Procedure(s) (LRB): VIDEO ASSISTED THORACOSCOPY (VATS)/WEDGE RESECTION OF RIGHT UPPER LUNG (Right) RIGHT UPPER LOBECTOMY (Right) Right Lateral Chest Tube Insertion (Right) MEDIASTINAL NODE DISSECTION (Right) Small air leak- keep CT to suction today IV to [facility] Advance diet Dc A line Mobilize SCD + enoxaparin for DVT prophylaxis Continue PCA   LOS:  1 day    Melrose Nakayama 04/24/2018

## 2018-04-25 ENCOUNTER — Inpatient Hospital Stay (HOSPITAL_COMMUNITY): Payer: Medicare HMO

## 2018-04-25 LAB — COMPREHENSIVE METABOLIC PANEL
ALT: 15 U/L (ref 0–44)
AST: 20 U/L (ref 15–41)
Albumin: 2.6 g/dL — ABNORMAL LOW (ref 3.5–5.0)
Alkaline Phosphatase: 48 U/L (ref 38–126)
Anion gap: 4 — ABNORMAL LOW (ref 5–15)
BUN: 10 mg/dL (ref 8–23)
CO2: 30 mmol/L (ref 22–32)
CREATININE: 0.61 mg/dL (ref 0.44–1.00)
Calcium: 7.8 mg/dL — ABNORMAL LOW (ref 8.9–10.3)
Chloride: 104 mmol/L (ref 98–111)
GFR calc Af Amer: >60 mL/min (ref >60)
GFR calc non Af Amer: >60 mL/min (ref >60)
Glucose, Bld: 110 mg/dL — ABNORMAL HIGH (ref 70–99)
Potassium: 4 mmol/L (ref 3.5–5.1)
Sodium: 138 mmol/L (ref 135–145)
Total Bilirubin: 0.3 mg/dL (ref 0.3–1.2)
Total Protein: 5.6 g/dL — ABNORMAL LOW (ref 6.5–8.1)

## 2018-04-25 LAB — CBC
HCT: 36.4 % (ref 36.0–46.0)
HEMOGLOBIN: 11.1 g/dL — AB (ref 12.0–15.0)
MCH: 29.5 pg (ref 26.0–34.0)
MCHC: 30.5 g/dL (ref 30.0–36.0)
MCV: 96.8 fL (ref 80.0–100.0)
Platelets: 303 10*3/uL (ref 150–400)
RBC: 3.76 MIL/uL — AB (ref 3.87–5.11)
RDW: 13.3 % (ref 11.5–15.5)
WBC: 12.6 10*3/uL — ABNORMAL HIGH (ref 4.0–10.5)
nRBC: 0 % (ref 0.0–0.2)

## 2018-04-25 MED ORDER — ONDANSETRON HCL 4 MG/2ML IJ SOLN
4.0000 mg | Freq: Four times a day (QID) | INTRAMUSCULAR | Status: DC | PRN
  Administered 2018-04-25 – 2018-05-06 (×5): 4 mg via INTRAVENOUS
  Filled 2018-04-25 (×8): qty 2

## 2018-04-25 NOTE — Progress Notes (Signed)
Pt encouraged multiple times to ambulate in hallway. Pt states "I will tomorrow" multiple times. Pt did ambulate to bathroom multiple times today with minimal assist. Son remains at bedside. Will continue to monitor.

## 2018-04-25 NOTE — Progress Notes (Addendum)
2 Days Post-Op Procedure(s) (LRB): VIDEO ASSISTED THORACOSCOPY (VATS)/WEDGE RESECTION OF RIGHT UPPER LUNG (Right) RIGHT UPPER LOBECTOMY (Right) Right Lateral Chest Tube Insertion (Right) MEDIASTINAL NODE DISSECTION (Right) Subjective: Awake and alert, resting supine in bed.  She reports she is more comfortable today using a combination of oral OxyIR and tramadol.  She has not been using the PCA at all due to nausea associated with utilizing it earlier.  Denies shortness of breath.  Objective: Vital signs in last 24 hours: Temp:  [97.9 F (36.6 C)-98.5 F (36.9 C)] 98.2 F (36.8 C) (02/26 0825) Pulse Rate:  [75-90] 90 (02/26 0825) Cardiac Rhythm: Normal sinus rhythm (02/26 0729) Resp:  [14-21] 18 (02/26 0825) BP: (113-154)/(47-55) 121/52 (02/26 0825) SpO2:  [92 %-100 %] 98 % (02/26 0825) FiO2 (%):  [28 %] 28 % (02/26 0726)   Intake/Output from previous day: 02/25 0701 - 02/26 0700 In: 1490.3 [P.O.:340; I.V.:1150.3] Out: 475 [Urine:475] Intake/Output this shift: No intake/output data recorded.  General appearance: alert, cooperative and no distress Heart: regular rate and rhythm Lungs: Breath sounds clear on the left.  Coarse rhonchi, possible pleural rub on the right.  There is a small but fairly continuous air leak.  Chest x-ray was reviewed.  Small right apical pneumothorax is stable.  Lung fields are otherwise clear. Wound: Right chest dressing is dry and intact.  Lab Results: Recent Labs    04/24/18 0500 04/25/18 0538  WBC 15.0* 12.6*  HGB 11.6* 11.1*  HCT 35.3* 36.4  PLT 329 303   BMET:  Recent Labs    04/24/18 0500 04/25/18 0538  NA 137 138  K 3.9 4.0  CL 104 104  CO2 24 30  GLUCOSE 171* 110*  BUN 11 10  CREATININE 0.60 0.61  CALCIUM 7.9* 7.8*    PT/INR: No results for input(s): LABPROT, INR in the last 72 hours. ABG    Component Value Date/Time   PHART 7.354 04/24/2018 0555   HCO3 26.3 04/24/2018 0555   TCO2 31 04/10/2018 1118   O2SAT 98.6  04/24/2018 0555   CBG (last 3)  No results for input(s): GLUCAP in the last 72 hours.  Assessment/Plan: S/P Procedure(s) (LRB): VIDEO ASSISTED THORACOSCOPY (VATS)/WEDGE RESECTION OF RIGHT UPPER LUNG (Right) RIGHT UPPER LOBECTOMY (Right) Right Lateral Chest Tube Insertion (Right) MEDIASTINAL NODE DISSECTION (Right)  -Postop day 2 right upper and middle lobectomy for clinical stage Ia, pathologic stage T1N0 non-small cell carcinoma.  Chest x-ray shows a stable right apical pneumothorax and there is a small persistent air leak.  Will reduce the suction to -10 cmH2O today and repeat chest x-ray tomorrow morning. -Pain control is adequate with oral analgesics.  Will discontinue the PCA -Remove Foley catheter -COPD-continue nebulizer treatments and encourage pulmonary hygiene -Advance activity. -DVT prophylaxis-continue enoxaparin   LOS: 2 days    Hannah Green, Hannah Green [phone] 04/25/2018 Patient seen and examined, agree with above Mobilize Nausea better with PO pain meds  Hannah Lipps C. Roxan Hockey, MD [facility] [phone]

## 2018-04-25 NOTE — Progress Notes (Signed)
52ml of Morphine PCA wasted in stericycle. Witnessed by Lurline Hare and Investment banker, corporate.

## 2018-04-26 ENCOUNTER — Encounter: Payer: Self-pay | Admitting: Pulmonary Disease

## 2018-04-26 ENCOUNTER — Inpatient Hospital Stay (HOSPITAL_COMMUNITY): Payer: Medicare HMO

## 2018-04-26 LAB — BLOOD GAS, ARTERIAL

## 2018-04-26 MED ORDER — PROMETHAZINE HCL 25 MG/ML IJ SOLN
12.5000 mg | Freq: Four times a day (QID) | INTRAMUSCULAR | Status: DC | PRN
  Administered 2018-04-27: 12.5 mg via INTRAVENOUS
  Filled 2018-04-26: qty 1

## 2018-04-26 NOTE — Care Management Important Message (Signed)
Important Message  Patient Details  Name: Hannah Green MRN: [id number] Date of Birth: [birth year]   Medicare Important Message Given:  Yes    Shelda Altes 04/26/2018, 12:59 PM

## 2018-04-26 NOTE — Progress Notes (Signed)
3 Days Post-Op Procedure(s) (LRB): VIDEO ASSISTED THORACOSCOPY (VATS)/WEDGE RESECTION OF RIGHT UPPER LUNG (Right) RIGHT UPPER LOBECTOMY (Right) Right Lateral Chest Tube Insertion (Right) MEDIASTINAL NODE DISSECTION (Right) Subjective: Some nausea, pain variable, bad at times  Objective: Vital signs in last 24 hours: Temp:  [98.3 F (36.8 C)-98.8 F (37.1 C)] 98.5 F (36.9 C) (02/27 0739) Pulse Rate:  [84-97] 89 (02/27 0739) Cardiac Rhythm: Normal sinus rhythm (02/27 0805) Resp:  [13-24] 13 (02/27 0739) BP: (123-139)/(52-60) 139/56 (02/27 0739) SpO2:  [88 %-100 %] 88 % (02/27 0739) FiO2 (%):  [21 %] 21 % (02/26 2014)  Hemodynamic parameters for last 24 hours:    Intake/Output from previous day: 02/26 0701 - 02/27 0700 In: 184.1 [I.V.:184.1] Out: 580 [Urine:350; Chest Tube:230] Intake/Output this shift: No intake/output data recorded.  General appearance: alert, cooperative and no distress Neurologic: intact Heart: regular rate and rhythm Lungs: wheezes bilaterally Abdomen: normal findings: soft, non-tender Wound: clean and dry + air leak  Lab Results: Recent Labs    04/24/18 0500 04/25/18 0538  WBC 15.0* 12.6*  HGB 11.6* 11.1*  HCT 35.3* 36.4  PLT 329 303   BMET:  Recent Labs    04/24/18 0500 04/25/18 0538  NA 137 138  K 3.9 4.0  CL 104 104  CO2 24 30  GLUCOSE 171* 110*  BUN 11 10  CREATININE 0.60 0.61  CALCIUM 7.9* 7.8*    PT/INR: No results for input(s): LABPROT, INR in the last 72 hours. ABG    Component Value Date/Time   PHART 7.354 04/24/2018 0555   HCO3 26.3 04/24/2018 0555   TCO2 31 04/10/2018 1118   O2SAT 98.6 04/24/2018 0555   CBG (last 3)  No results for input(s): GLUCAP in the last 72 hours.  Assessment/Plan: S/P Procedure(s) (LRB): VIDEO ASSISTED THORACOSCOPY (VATS)/WEDGE RESECTION OF RIGHT UPPER LUNG (Right) RIGHT UPPER LOBECTOMY (Right) Right Lateral Chest Tube Insertion (Right) MEDIASTINAL NODE DISSECTION (Right) - Path-  T2N0 stage IB squamous cell carcinoma  Still has an air leak- will leave CT to suction SCD + enoxaparin + ambulation for DVT prophylaxis Nebs, IS   LOS: 3 days    Melrose Nakayama 04/26/2018

## 2018-04-27 ENCOUNTER — Inpatient Hospital Stay (HOSPITAL_COMMUNITY): Payer: Medicare HMO

## 2018-04-27 LAB — CBC
HCT: 36.6 % (ref 36.0–46.0)
HEMOGLOBIN: 11.6 g/dL — AB (ref 12.0–15.0)
MCH: 30.4 pg (ref 26.0–34.0)
MCHC: 31.7 g/dL (ref 30.0–36.0)
MCV: 96.1 fL (ref 80.0–100.0)
Platelets: 344 10*3/uL (ref 150–400)
RBC: 3.81 MIL/uL — ABNORMAL LOW (ref 3.87–5.11)
RDW: 13.6 % (ref 11.5–15.5)
WBC: 12.3 10*3/uL — ABNORMAL HIGH (ref 4.0–10.5)
nRBC: 0 % (ref 0.0–0.2)

## 2018-04-27 MED ORDER — AMIODARONE HCL IN DEXTROSE 360-4.14 MG/200ML-% IV SOLN
60.0000 mg/h | INTRAVENOUS | Status: AC
  Administered 2018-04-27: 60 mg/h via INTRAVENOUS
  Filled 2018-04-27: qty 200

## 2018-04-27 MED ORDER — AMIODARONE HCL IN DEXTROSE 360-4.14 MG/200ML-% IV SOLN
INTRAVENOUS | Status: AC
  Filled 2018-04-27: qty 200

## 2018-04-27 MED ORDER — AMIODARONE HCL IN DEXTROSE 360-4.14 MG/200ML-% IV SOLN
30.0000 mg/h | INTRAVENOUS | Status: DC
  Administered 2018-04-28: 30 mg/h via INTRAVENOUS
  Filled 2018-04-27: qty 200

## 2018-04-27 MED ORDER — AMIODARONE HCL 200 MG PO TABS: 200.0000 mg | ORAL_TABLET | Freq: Two times a day (BID) | ORAL | Status: DC

## 2018-04-27 MED ORDER — POLYETHYLENE GLYCOL 3350 17 G PO PACK
17.0000 g | PACK | Freq: Every day | ORAL | Status: DC
  Administered 2018-04-27 – 2018-04-29 (×3): 17 g via ORAL
  Filled 2018-04-27 (×3): qty 1

## 2018-04-27 MED ORDER — AMIODARONE LOAD VIA INFUSION
150.0000 mg | Freq: Once | INTRAVENOUS | Status: AC
  Administered 2018-04-27: 150 mg via INTRAVENOUS
  Filled 2018-04-27: qty 83.34

## 2018-04-27 NOTE — Care Management Note (Signed)
Case Management Note  Patient Details  Name: Hannah Green MRN: [id number] Date of Birth: [birth year]  Subjective/Objective:   Pt is s/p VATS                 Action/Plan:  PTA Independent from home.  Pt has PCP and denied barriers with paying for medications.  Pt independently ambulating in room.  CM will continue to follow for discharge needs   Expected Discharge Date:  04/27/18               Expected Discharge Plan:  Home/Self Care  In-House Referral:     Discharge planning Services  CM Consult  Post Acute Care Choice:    Choice offered to:     DME Arranged:    DME Agency:     HH Arranged:    HH Agency:     Status of Service:     If discussed at [employer], dates discussed:    Additional Comments:  Maryclare Labrador, RN 04/27/2018, 4:29 PM

## 2018-04-27 NOTE — Progress Notes (Signed)
  Amiodarone Drug - Drug Interaction Consult Note  Recommendations: Monitor PRN promethazine use Amiodarone is metabolized by the cytochrome P450 system and therefore has the potential to cause many drug interactions. Amiodarone has an average plasma half-life of 50 days (range 20 to 100 days).   There is potential for drug interactions to occur several weeks or months after stopping treatment and the onset of drug interactions may be slow after initiating amiodarone.   []  Statins: Increased risk of myopathy. Simvastatin- restrict dose to 20mg  daily. Other statins: counsel patients to report any muscle pain or weakness immediately.  []  Anticoagulants: Amiodarone can increase anticoagulant effect. Consider warfarin dose reduction. Patients should be monitored closely and the dose of anticoagulant altered accordingly, remembering that amiodarone levels take several weeks to stabilize.  []  Antiepileptics: Amiodarone can increase plasma concentration of phenytoin, the dose should be reduced. Note that small changes in phenytoin dose can result in large changes in levels. Monitor patient and counsel on signs of toxicity.  []  Beta blockers: increased risk of bradycardia, AV block and myocardial depression. Sotalol - avoid concomitant use.  []   Calcium channel blockers (diltiazem and verapamil): increased risk of bradycardia, AV block and myocardial depression.  []   Cyclosporine: Amiodarone increases levels of cyclosporine. Reduced dose of cyclosporine is recommended.  []  Digoxin dose should be halved when amiodarone is started.  []  Diuretics: increased risk of cardiotoxicity if hypokalemia occurs.  []  Oral hypoglycemic agents (glyburide, glipizide, glimepiride): increased risk of hypoglycemia. Patient's glucose levels should be monitored closely when initiating amiodarone therapy.   [x]  Drugs that prolong the QT interval:  Torsades de pointes risk may be increased with concurrent use - avoid if  possible.  Monitor QTc, also keep magnesium/potassium WNL if concurrent therapy can't be avoided. Marland Kitchen Antibiotics: e.g. fluoroquinolones, erythromycin. . Antiarrhythmics: e.g. quinidine, procainamide, disopyramide, sotalol. . Antipsychotics: e.g. phenothiazines, haloperidol.  . Lithium, tricyclic antidepressants, and methadone. Thank You,  Narda Bonds  04/27/2018 11:50 PM

## 2018-04-27 NOTE — Progress Notes (Signed)
4 Days Post-Op Procedure(s) (LRB): VIDEO ASSISTED THORACOSCOPY (VATS)/WEDGE RESECTION OF RIGHT UPPER LUNG (Right) RIGHT UPPER LOBECTOMY (Right) Right Lateral Chest Tube Insertion (Right) MEDIASTINAL NODE DISSECTION (Right) Subjective: Rested better last night. Walked in the hall yesterday. No BM yet and beginning to feel uncomfortable.  Objective: Vital signs in last 24 hours: Temp:  [97.5 F (36.4 C)-98.3 F (36.8 C)] 98.2 F (36.8 C) (02/28 0354) Pulse Rate:  [74-105] 77 (02/28 0354) Cardiac Rhythm: Normal sinus rhythm (02/28 0000) Resp:  [13-20] 14 (02/28 0354) BP: (124-149)/(53-60) 124/60 (02/28 0354) SpO2:  [98 %-100 %] 98 % (02/28 0354)     Intake/Output from previous day: 02/27 0701 - 02/28 0700 In: 480 [P.O.:480] Out: 200 [Chest Tube:200] Intake/Output this shift: No intake/output data recorded.  General appearance: alert, cooperative and no distress Heart: regular rate and rhythm Lungs: Breath sounds clear on the left. Expiratory wheeze on the right that has improved.  There continues to be a small but fairly continuous air leak with spontaneous respiration.  Chest x-ray was reviewed.  Small right apical pneumothorax is stable.  Lung fields are otherwise clear. Wound: Right chest dressing is dry and intact.  Lab Results: Recent Labs    04/25/18 0538  WBC 12.6*  HGB 11.1*  HCT 36.4  PLT 303   BMET:  Recent Labs    04/25/18 0538  NA 138  K 4.0  CL 104  CO2 30  GLUCOSE 110*  BUN 10  CREATININE 0.61  CALCIUM 7.8*    PT/INR: No results for input(s): LABPROT, INR in the last 72 hours. ABG    Component Value Date/Time   PHART 7.354 04/24/2018 0555   HCO3 26.3 04/24/2018 0555   TCO2 31 04/10/2018 1118   ACIDBASEDEF TEST WILL BE CREDITED 04/19/2018 0930   O2SAT 98.6 04/24/2018 0555   CBG (last 3)  No results for input(s): GLUCAP in the last 72 hours.  Assessment/Plan: S/P Procedure(s) (LRB): VIDEO ASSISTED THORACOSCOPY (VATS)/WEDGE RESECTION OF  RIGHT UPPER LUNG (Right) RIGHT UPPER LOBECTOMY (Right) Right Lateral Chest Tube Insertion (Right) MEDIASTINAL NODE DISSECTION (Right)   LOS: 4 days   -Postop day 4 right upper and middle lobectomy for clinical stage Ia, pathologic stage T1N0 non-small cell carcinoma.  Chest x-ray shows a stable right apical pneumothorax and there is a small persistent air leak.  Will continue suction at -10 cmH2O today and repeat chest x-ray tomorrow morning. -Pain control is adequate with oral analgesics. -Miralax today for constipation  -COPD-continue nebulizer treatments and encourage pulmonary hygiene -Now has peripheral IV, will remove neck line. -Advance activity. -DVT prophylaxis-continue enoxaparin  Antony Odea, PA-C [phone] 04/27/2018

## 2018-04-27 NOTE — Progress Notes (Addendum)
Complained of pain on right chest surgical site and slight shortness of breath, tramadol given, chest tube placed back on -10 suction , still with large air leak. PA made aware . Continue to monitor.

## 2018-04-27 NOTE — Progress Notes (Addendum)
Patient developed AFIB RVR 140's become diaphoretic and feeling her heart beat fast. EKG done to confirm and BP 151/65. I notified MD on call and ordered received to start Amiodarone gtt with oral conversion and labs. I will continue to monitor.

## 2018-04-28 ENCOUNTER — Inpatient Hospital Stay (HOSPITAL_COMMUNITY): Payer: Medicare HMO

## 2018-04-28 LAB — BASIC METABOLIC PANEL
Anion gap: 7 (ref 5–15)
BUN: 10 mg/dL (ref 8–23)
CO2: 28 mmol/L (ref 22–32)
Calcium: 8.6 mg/dL — ABNORMAL LOW (ref 8.9–10.3)
Chloride: 103 mmol/L (ref 98–111)
Creatinine, Ser: 0.52 mg/dL (ref 0.44–1.00)
GFR calc Af Amer: >60 mL/min (ref >60)
GFR calc non Af Amer: >60 mL/min (ref >60)
Glucose, Bld: 151 mg/dL — ABNORMAL HIGH (ref 70–99)
Potassium: 4.1 mmol/L (ref 3.5–5.1)
Sodium: 138 mmol/L (ref 135–145)

## 2018-04-28 LAB — CBC
HCT: 37 % (ref 36.0–46.0)
Hemoglobin: 11.5 g/dL — ABNORMAL LOW (ref 12.0–15.0)
MCH: 29.3 pg (ref 26.0–34.0)
MCHC: 31.1 g/dL (ref 30.0–36.0)
MCV: 94.4 fL (ref 80.0–100.0)
Platelets: 326 10*3/uL (ref 150–400)
RBC: 3.92 MIL/uL (ref 3.87–5.11)
RDW: 13.5 % (ref 11.5–15.5)
WBC: 11.6 10*3/uL — ABNORMAL HIGH (ref 4.0–10.5)
nRBC: 0 % (ref 0.0–0.2)

## 2018-04-28 LAB — TSH: TSH: 2.469 u[IU]/mL (ref 0.350–4.500)

## 2018-04-28 LAB — MAGNESIUM: Magnesium: 1.7 mg/dL (ref 1.7–2.4)

## 2018-04-28 MED ORDER — MAGNESIUM OXIDE 400 (241.3 MG) MG PO TABS
400.0000 mg | ORAL_TABLET | Freq: Two times a day (BID) | ORAL | Status: AC
  Administered 2018-04-28 – 2018-04-30 (×6): 400 mg via ORAL
  Filled 2018-04-28 (×6): qty 1

## 2018-04-28 MED ORDER — HYDROCHLOROTHIAZIDE 12.5 MG PO CAPS
12.5000 mg | ORAL_CAPSULE | Freq: Every day | ORAL | Status: DC
  Administered 2018-04-28 – 2018-04-29 (×2): 12.5 mg via ORAL
  Filled 2018-04-28 (×2): qty 1

## 2018-04-28 MED ORDER — AMIODARONE HCL 200 MG PO TABS: 200.0000 mg | ORAL_TABLET | Freq: Two times a day (BID) | ORAL | Status: DC

## 2018-04-28 MED ORDER — AMIODARONE HCL 200 MG PO TABS
200.0000 mg | ORAL_TABLET | Freq: Two times a day (BID) | ORAL | Status: DC
  Administered 2018-04-28 – 2018-05-01 (×8): 200 mg via ORAL
  Filled 2018-04-28 (×8): qty 1

## 2018-04-28 MED ORDER — GUAIFENESIN ER 600 MG PO TB12
1200.0000 mg | ORAL_TABLET | Freq: Two times a day (BID) | ORAL | Status: DC
  Administered 2018-04-28 – 2018-05-06 (×17): 1200 mg via ORAL
  Filled 2018-04-28 (×17): qty 2

## 2018-04-28 NOTE — Progress Notes (Signed)
  Amiodarone Drug - Drug Interaction Consult Note  Recommendations: None, monitor for now.  Amiodarone is metabolized by the cytochrome P450 system and therefore has the potential to cause many drug interactions. Amiodarone has an average plasma half-life of 50 days (range 20 to 100 days).   There is potential for drug interactions to occur several weeks or months after stopping treatment and the onset of drug interactions may be slow after initiating amiodarone.   [x]  Statins: Increased risk of myopathy. Simvastatin- restrict dose to 20mg  daily. Other statins: counsel patients to report any muscle pain or weakness immediately.  []  Anticoagulants: Amiodarone can increase anticoagulant effect. Consider warfarin dose reduction. Patients should be monitored closely and the dose of anticoagulant altered accordingly, remembering that amiodarone levels take several weeks to stabilize.  []  Antiepileptics: Amiodarone can increase plasma concentration of phenytoin, the dose should be reduced. Note that small changes in phenytoin dose can result in large changes in levels. Monitor patient and counsel on signs of toxicity.  []  Beta blockers: increased risk of bradycardia, AV block and myocardial depression. Sotalol - avoid concomitant use.  []   Calcium channel blockers (diltiazem and verapamil): increased risk of bradycardia, AV block and myocardial depression.  []   Cyclosporine: Amiodarone increases levels of cyclosporine. Reduced dose of cyclosporine is recommended.  []  Digoxin dose should be halved when amiodarone is started.  []  Diuretics: increased risk of cardiotoxicity if hypokalemia occurs.  []  Oral hypoglycemic agents (glyburide, glipizide, glimepiride): increased risk of hypoglycemia. Patient's glucose levels should be monitored closely when initiating amiodarone therapy.   []  Drugs that prolong the QT interval:  Torsades de pointes risk may be increased with concurrent use - avoid if  possible.  Monitor QTc, also keep magnesium/potassium WNL if concurrent therapy can't be avoided. Marland Kitchen Antibiotics: e.g. fluoroquinolones, erythromycin. . Antiarrhythmics: e.g. quinidine, procainamide, disopyramide, sotalol. . Antipsychotics: e.g. phenothiazines, haloperidol.  . Lithium, tricyclic antidepressants, and methadone. Thank You,  Hannah Green  04/28/2018 3:01 PM

## 2018-04-28 NOTE — Progress Notes (Signed)
Ambulated along the hallway with walker  For about 450 feet, complained of dizziness, assisted back to bed. . No further  dizziness noted. Continue to monitor.

## 2018-04-28 NOTE — Progress Notes (Addendum)
[address]       [place] [address]             [phone]      5 Days Post-Op Procedure(s) (LRB): VIDEO ASSISTED THORACOSCOPY (VATS)/WEDGE RESECTION OF RIGHT UPPER LUNG (Right) RIGHT UPPER LOBECTOMY (Right) Right Lateral Chest Tube Insertion (Right) MEDIASTINAL NODE DISSECTION (Right)   Subjective:  Patient had a rough night.  States nursing care yesterday was poor.  She was not ambulated.  Continues to have pain. Difficulty coughing up sputum  Objective: Vital signs in last 24 hours: Temp:  [97.7 F (36.5 C)-98.7 F (37.1 C)] 97.7 F (36.5 C) (02/29 0749) Pulse Rate:  [64-163] 66 (02/29 0812) Cardiac Rhythm: Atrial fibrillation (02/28 2303) Resp:  [12-27] 12 (02/29 0812) BP: (124-154)/(51-90) 154/69 (02/29 0749) SpO2:  [95 %-100 %] 95 % (02/29 0816) FiO2 (%):  [28 %] 28 % (02/29 0816)  Intake/Output from previous day: 02/28 0701 - 02/29 0700 In: 250 [P.O.:250] Out: 290 [Chest Tube:290]  General appearance: alert, cooperative and no distress Heart: regular rate and rhythm Lungs: clear to auscultation bilaterally Abdomen: soft, non-tender; bowel sounds normal; no masses,  no organomegaly Extremities: extremities normal, atraumatic, no cyanosis or edema Wound: clean and dry  Lab Results: Recent Labs    04/27/18 1458 04/28/18 0231  WBC 12.3* 11.6*  HGB 11.6* 11.5*  HCT 36.6 37.0  PLT 344 326   BMET:  Recent Labs    04/28/18 0231  NA 138  K 4.1  CL 103  CO2 28  GLUCOSE 151*  BUN 10  CREATININE 0.52  CALCIUM 8.6*    PT/INR: No results for input(s): LABPROT, INR in the last 72 hours. ABG    Component Value Date/Time   PHART 7.354 04/24/2018 0555   HCO3 26.3 04/24/2018 0555   TCO2 31 04/10/2018 1118   ACIDBASEDEF TEST WILL BE CREDITED 04/19/2018 0930   O2SAT 98.6 04/24/2018 0555   CBG (last 3)  No results for input(s): GLUCAP in the last 72 hours.  Assessment/Plan: S/P Procedure(s) (LRB): VIDEO ASSISTED THORACOSCOPY  (VATS)/WEDGE RESECTION OF RIGHT UPPER LUNG (Right) RIGHT UPPER LOBECTOMY (Right) Right Lateral Chest Tube Insertion (Right) MEDIASTINAL NODE DISSECTION (Right)  1. Chest tube- persistent air leak, CXR remains stable with apical space vs. Small pneumothorax- leave on suction today 2. CV- Atrial Fibrillation with RVR overnight, Sinus Loletha Grayer this morning- will transition to oral Amiodarone today.. She is hypertensive in the 150s- will start low dose HCTZ today 3. Pulm- wean oxygen as tolerated, CXR stable, will continue nebs, add Mucinex to help with cough 4. Lovenox for DVT 5. Dispo- patient with A. Fib RVR overnight, Sinus brady this morning, will transition to oral amiodarone, persistent air leak leave chest tube on suction, repeat CXR in AM   LOS: 5 days    Ellwood Handler 04/28/2018  Dg Chest Port 1 View  Result Date: 04/28/2018 CLINICAL DATA:  Status post right lobectomy EXAM: PORTABLE CHEST 1 VIEW COMPARISON:  04/27/2018 FINDINGS: Right jugular central line is been removed in the interval. Small right apical pneumothorax is noted slightly greater than that seen on the prior exam. Chest tube is again noted in place. Left lung remains clear. Cardiac shadow is stable. IMPRESSION: Slight increase in right-sided pneumothorax. Chest tube remains in place. Electronically Signed   By: Inez Catalina M.D.   On: 04/28/2018 09:43   started on iv Cordarone last night for Afib, converted quickly transition to po  Still with  air leak on suction - leave on suction now I have seen and examined Hannah Green and agree with the above assessment  and plan.  [name] MD Beeper [phone] Office [phone] 04/28/2018 10:39 AM

## 2018-04-28 NOTE — Progress Notes (Signed)
Offered ambulation but with constant pain after placing  chest tube  on water seal. Managed ambulation to the bathroom.

## 2018-04-28 NOTE — Progress Notes (Signed)
Ambulated 450 feet  along the hallway with front walker  tolerated well. Denied any dizziness.

## 2018-04-28 NOTE — Progress Notes (Signed)
Did not get lunch tray, called service response and let pt talk to the ambassador. Pt. Refused to order food for now since it is near dinner. Offered some crackers and some food available on the floor but refused.

## 2018-04-29 ENCOUNTER — Inpatient Hospital Stay (HOSPITAL_COMMUNITY): Payer: Medicare HMO

## 2018-04-29 MED ORDER — METOPROLOL TARTRATE 12.5 MG HALF TABLET
12.5000 mg | ORAL_TABLET | Freq: Two times a day (BID) | ORAL | Status: DC
  Administered 2018-04-29 – 2018-05-01 (×6): 12.5 mg via ORAL
  Filled 2018-04-29 (×6): qty 1

## 2018-04-29 MED ORDER — HYDROCHLOROTHIAZIDE 12.5 MG PO CAPS: 25.0000 mg | ORAL_CAPSULE | Freq: Every day | ORAL | Status: DC

## 2018-04-29 MED ORDER — METOCLOPRAMIDE HCL 5 MG/ML IJ SOLN
10.0000 mg | Freq: Four times a day (QID) | INTRAMUSCULAR | Status: AC
  Administered 2018-04-29 – 2018-04-30 (×4): 10 mg via INTRAVENOUS
  Filled 2018-04-29 (×4): qty 2

## 2018-04-29 MED ORDER — HYDROCHLOROTHIAZIDE 12.5 MG PO CAPS
12.5000 mg | ORAL_CAPSULE | Freq: Every day | ORAL | Status: DC
  Administered 2018-04-30 – 2018-05-06 (×7): 12.5 mg via ORAL
  Filled 2018-04-29 (×7): qty 1

## 2018-04-29 MED ORDER — LACTULOSE 10 GM/15ML PO SOLN
20.0000 g | Freq: Once | ORAL | Status: AC
  Administered 2018-04-29: 20 g via ORAL
  Filled 2018-04-29: qty 30

## 2018-04-29 MED ORDER — ACETAMINOPHEN 325 MG PO TABS
650.0000 mg | ORAL_TABLET | Freq: Four times a day (QID) | ORAL | Status: DC | PRN
  Administered 2018-04-29 – 2018-05-05 (×12): 650 mg via ORAL
  Filled 2018-04-29 (×13): qty 2

## 2018-04-29 MED ORDER — LUNG SURGERY BOOK
Freq: Once | Status: AC
  Administered 2018-04-29: 13:00:00

## 2018-04-29 NOTE — Plan of Care (Signed)
  Problem: Cardiac: Goal: Will achieve and/or maintain hemodynamic stability Outcome: Progressing   Problem: Clinical Measurements: Goal: Postoperative complications will be avoided or minimized Outcome: Progressing   Problem: Respiratory: Goal: Respiratory status will improve Outcome: Progressing   Problem: Pain Management: Goal: Pain level will decrease Outcome: Progressing   Problem: Skin Integrity: Goal: Wound healing without signs and symptoms infection will improve Outcome: Progressing

## 2018-04-29 NOTE — Progress Notes (Addendum)
      [address]       [place] [address]             [phone]      6 Days Post-Op Procedure(s) (LRB): VIDEO ASSISTED THORACOSCOPY (VATS)/WEDGE RESECTION OF RIGHT UPPER LUNG (Right) RIGHT UPPER LOBECTOMY (Right) Right Lateral Chest Tube Insertion (Right) MEDIASTINAL NODE DISSECTION (Right)   Subjective:  Patient with complaints of nausea.  Developed yesterday and has persisted.  She did vomit this morning.    Objective: Vital signs in last 24 hours: Temp:  [98.2 F (36.8 C)-98.4 F (36.9 C)] 98.2 F (36.8 C) (03/01 0800) Pulse Rate:  [70-80] 73 (03/01 0300) Cardiac Rhythm: Normal sinus rhythm (03/01 0700) Resp:  [15-21] 16 (03/01 0300) BP: (149-162)/(51-74) 157/63 (03/01 0800) SpO2:  [90 %-98 %] 90 % (03/01 0800)  Intake/Output from previous day: 02/29 0701 - 03/01 0700 In: 120 [P.O.:120] Out: 460 [Chest Tube:460]  General appearance: alert, cooperative and no distress Heart: regular rate and rhythm Lungs: clear to auscultation bilaterally Abdomen: soft, non-tender; bowel sounds normal; no masses,  no organomegaly Wound: clean and dry  Lab Results: Recent Labs    04/27/18 1458 04/28/18 0231  WBC 12.3* 11.6*  HGB 11.6* 11.5*  HCT 36.6 37.0  PLT 344 326   BMET:  Recent Labs    04/28/18 0231  NA 138  K 4.1  CL 103  CO2 28  GLUCOSE 151*  BUN 10  CREATININE 0.52  CALCIUM 8.6*    PT/INR: No results for input(s): LABPROT, INR in the last 72 hours. ABG    Component Value Date/Time   PHART 7.354 04/24/2018 0555   HCO3 26.3 04/24/2018 0555   TCO2 31 04/10/2018 1118   ACIDBASEDEF TEST WILL BE CREDITED 04/19/2018 0930   O2SAT 98.6 04/24/2018 0555   CBG (last 3)  No results for input(s): GLUCAP in the last 72 hours.  Assessment/Plan: S/P Procedure(s) (LRB): VIDEO ASSISTED THORACOSCOPY (VATS)/WEDGE RESECTION OF RIGHT UPPER LUNG (Right) RIGHT UPPER LOBECTOMY (Right) Right Lateral Chest Tube Insertion (Right) MEDIASTINAL NODE  DISSECTION (Right)  1. Chest tube- + air leak, but improved since yesterday, CXR with mild improvement of pneumothorax 2. CV- PAF, remains in NSR, + HTN will continue HCTZ, start low dose Lopressor, continue oral Amiodarone 3. Pulm- wean oxygen as tolerated, continue IS 4. Rena-creatinine has been stable, K is WNL, MG was low, has been supplemented will repeat labs in AM 5. GI- nausea since yesterday, could be related to Amiodarone, however patient has not yet moved bowels, exam is benign with good BS, patient is passing gas.  Will give Lactulose today, if bowls move and nausea persist may have to discontinue Amiodarone 6. Dispo- patient stable, start low dose Lopressor for BP and additional HR control, continue HCTZ, aggressive bowel regimen, as this can be attributing to nausea, however exam is benign, repeat BMET, CXR in AM   LOS: 6 days    Ellwood Handler 04/29/2018   Still with air leak  Holding sinus on po Amiodarone  Keep chest tube  I have seen and examined Hannah Green and agree with the above assessment  and plan.  [name] MD Beeper [phone] Office [phone] 04/29/2018 10:43 AM

## 2018-04-29 NOTE — Progress Notes (Signed)
Pt's chest tube drainage system is changed  Palma Holter, Therapist, sports

## 2018-04-30 ENCOUNTER — Inpatient Hospital Stay (HOSPITAL_COMMUNITY): Payer: Medicare HMO

## 2018-04-30 LAB — BASIC METABOLIC PANEL
Anion gap: 11 (ref 5–15)
BUN: 12 mg/dL (ref 8–23)
CHLORIDE: 99 mmol/L (ref 98–111)
CO2: 28 mmol/L (ref 22–32)
CREATININE: 0.67 mg/dL (ref 0.44–1.00)
Calcium: 8.3 mg/dL — ABNORMAL LOW (ref 8.9–10.3)
GFR calc Af Amer: >60 mL/min (ref >60)
GFR calc non Af Amer: >60 mL/min (ref >60)
Glucose, Bld: 106 mg/dL — ABNORMAL HIGH (ref 70–99)
POTASSIUM: 4 mmol/L (ref 3.5–5.1)
Sodium: 138 mmol/L (ref 135–145)

## 2018-04-30 LAB — MAGNESIUM: Magnesium: 1.7 mg/dL (ref 1.7–2.4)

## 2018-04-30 MED ORDER — LEVALBUTEROL HCL 0.63 MG/3ML IN NEBU
0.6300 mg | INHALATION_SOLUTION | Freq: Two times a day (BID) | RESPIRATORY_TRACT | Status: DC
  Administered 2018-04-30 – 2018-05-02 (×4): 0.63 mg via RESPIRATORY_TRACT
  Filled 2018-04-30 (×4): qty 3

## 2018-04-30 NOTE — Plan of Care (Signed)
  Problem: Cardiac: Goal: Will achieve and/or maintain hemodynamic stability Outcome: Progressing   Pt declined ambulation this AM, RN offered to walk with patient 4 times. Pt ambulated to restroom and chair for bath then back to bed. Pt ambulated 250 ft with RN, walker utilized for stabilization, pt tolerated fairly well, stated she got dizzy. SpO2 93% RA, HR 88 NSR, minimal SOB. CT output 0. Pt back to bed, per pt request. Refused SCD, educated. Will continue to monitor.

## 2018-04-30 NOTE — Progress Notes (Signed)
7 Days Post-Op Procedure(s) (LRB): VIDEO ASSISTED THORACOSCOPY (VATS)/WEDGE RESECTION OF RIGHT UPPER LUNG (Right) RIGHT UPPER LOBECTOMY (Right) Right Lateral Chest Tube Insertion (Right) MEDIASTINAL NODE DISSECTION (Right) Subjective: Some incisional pain and pain form CT  Objective: Vital signs in last 24 hours: Temp:  [97.7 F (36.5 C)-98.2 F (36.8 C)] 97.7 F (36.5 C) (03/02 0300) Pulse Rate:  [64-97] 97 (03/02 0300) Cardiac Rhythm: Normal sinus rhythm (03/01 1909) Resp:  [12-32] 17 (03/02 0300) BP: (112-158)/(43-78) 121/50 (03/02 0300) SpO2:  [89 %-99 %] 97 % (03/02 0300)  Hemodynamic parameters for last 24 hours:    Intake/Output from previous day: 03/01 0701 - 03/02 0700 In: 360 [P.O.:360] Out: 220 [Chest Tube:220] Intake/Output this shift: No intake/output data recorded.  General appearance: alert, cooperative and no distress Neurologic: intact Heart: regular rate and rhythm Lungs: clear to auscultation bilaterally Wound: clean and dry + air leak  Lab Results: Recent Labs    04/27/18 1458 04/28/18 0231  WBC 12.3* 11.6*  HGB 11.6* 11.5*  HCT 36.6 37.0  PLT 344 326   BMET:  Recent Labs    04/28/18 0231 04/30/18 0236  NA 138 138  K 4.1 4.0  CL 103 99  CO2 28 28  GLUCOSE 151* 106*  BUN 10 12  CREATININE 0.52 0.67  CALCIUM 8.6* 8.3*    PT/INR: No results for input(s): LABPROT, INR in the last 72 hours. ABG    Component Value Date/Time   PHART 7.354 04/24/2018 0555   HCO3 26.3 04/24/2018 0555   TCO2 31 04/10/2018 1118   ACIDBASEDEF TEST WILL BE CREDITED 04/19/2018 0930   O2SAT 98.6 04/24/2018 0555   CBG (last 3)  No results for input(s): GLUCAP in the last 72 hours.  Assessment/Plan: S/P Procedure(s) (LRB): VIDEO ASSISTED THORACOSCOPY (VATS)/WEDGE RESECTION OF RIGHT UPPER LUNG (Right) RIGHT UPPER LOBECTOMY (Right) Right Lateral Chest Tube Insertion (Right) MEDIASTINAL NODE DISSECTION (Right) - Still has small air leak  Will try CT to  water seal Otherwise doing well Continue ambulation   LOS: 7 days    Melrose Nakayama 04/30/2018

## 2018-04-30 NOTE — Plan of Care (Signed)
  Problem: Cardiac: Goal: Will achieve and/or maintain hemodynamic stability Outcome: Progressing   Problem: Clinical Measurements: Goal: Postoperative complications will be avoided or minimized Outcome: Progressing   Problem: Respiratory: Goal: Respiratory status will improve Outcome: Progressing   Problem: Pain Management: Goal: Pain level will decrease Outcome: Progressing   Problem: Skin Integrity: Goal: Wound healing without signs and symptoms infection will improve Outcome: Progressing

## 2018-04-30 NOTE — Care Management Important Message (Signed)
Important Message  Patient Details  Name: Hannah Green MRN: [id number] Date of Birth: 10-Jul-1945   Medicare Important Message Given:  Yes    Dartanian Knaggs P Marca Gadsby 04/30/2018, 1:20 PM

## 2018-05-01 ENCOUNTER — Inpatient Hospital Stay (HOSPITAL_COMMUNITY): Payer: Medicare HMO

## 2018-05-01 NOTE — Progress Notes (Signed)
Paged provider concerning patient complain soreness just below the knee. No swelling, warmth, or redness.  Provider returned page advise to give pain medication to help and will assess tomorrow.

## 2018-05-01 NOTE — Plan of Care (Signed)
  Problem: Cardiac: Goal: Will achieve and/or maintain hemodynamic stability Outcome: Progressing   Problem: Clinical Measurements: Goal: Postoperative complications will be avoided or minimized Outcome: Progressing   Problem: Respiratory: Goal: Respiratory status will improve Outcome: Progressing   Problem: Pain Management: Goal: Pain level will decrease Outcome: Progressing   Problem: Skin Integrity: Goal: Wound healing without signs and symptoms infection will improve Outcome: Progressing

## 2018-05-01 NOTE — Progress Notes (Signed)
8 Days Post-Op Procedure(s) (LRB): VIDEO ASSISTED THORACOSCOPY (VATS)/WEDGE RESECTION OF RIGHT UPPER LUNG (Right) RIGHT UPPER LOBECTOMY (Right) Right Lateral Chest Tube Insertion (Right) MEDIASTINAL NODE DISSECTION (Right) Subjective: Still with som nausea, discouraged about air leak  Objective: Vital signs in last 24 hours: Temp:  [97.7 F (36.5 C)-98.1 F (36.7 C)] 97.7 F (36.5 C) (03/03 0300) Pulse Rate:  [68-86] 71 (03/03 0755) Cardiac Rhythm: Normal sinus rhythm (03/03 0700) Resp:  [16-20] 16 (03/03 0755) BP: (141-158)/(47-65) 147/47 (03/03 0300) SpO2:  [91 %-99 %] 92 % (03/03 0759)  Hemodynamic parameters for last 24 hours:    Intake/Output from previous day: 03/02 0701 - 03/03 0700 In: 368.8 [I.V.:368.8] Out: 200 [Chest Tube:200] Intake/Output this shift: No intake/output data recorded.  General appearance: alert, cooperative and no distress Neurologic: intact Heart: regular rate and rhythm Lungs: diminished breath sounds right base + air leak  Lab Results: No results for input(s): WBC, HGB, HCT, PLT in the last 72 hours. BMET:  Recent Labs    04/30/18 0236  NA 138  K 4.0  CL 99  CO2 28  GLUCOSE 106*  BUN 12  CREATININE 0.67  CALCIUM 8.3*    PT/INR: No results for input(s): LABPROT, INR in the last 72 hours. ABG    Component Value Date/Time   PHART 7.354 04/24/2018 0555   HCO3 26.3 04/24/2018 0555   TCO2 31 04/10/2018 1118   ACIDBASEDEF TEST WILL BE CREDITED 04/19/2018 0930   O2SAT 98.6 04/24/2018 0555   CBG (last 3)  No results for input(s): GLUCAP in the last 72 hours.  Assessment/Plan: S/P Procedure(s) (LRB): VIDEO ASSISTED THORACOSCOPY (VATS)/WEDGE RESECTION OF RIGHT UPPER LUNG (Right) RIGHT UPPER LOBECTOMY (Right) Right Lateral Chest Tube Insertion (Right) MEDIASTINAL NODE DISSECTION (Right) -Overall doing well A lot of tidal movement in pleuravac, + air on 3rd cough Will keep CT in today Continue ambulation   LOS: 8 days     Melrose Nakayama 05/01/2018

## 2018-05-02 ENCOUNTER — Inpatient Hospital Stay (HOSPITAL_COMMUNITY): Payer: Medicare HMO

## 2018-05-02 ENCOUNTER — Ambulatory Visit: Payer: Medicare HMO | Admitting: Pulmonary Disease

## 2018-05-02 LAB — BASIC METABOLIC PANEL
Anion gap: 8 (ref 5–15)
BUN: 16 mg/dL (ref 8–23)
CALCIUM: 8.8 mg/dL — AB (ref 8.9–10.3)
CO2: 27 mmol/L (ref 22–32)
Chloride: 100 mmol/L (ref 98–111)
Creatinine, Ser: 0.65 mg/dL (ref 0.44–1.00)
GFR calc Af Amer: >60 mL/min (ref >60)
GFR calc non Af Amer: >60 mL/min (ref >60)
Glucose, Bld: 129 mg/dL — ABNORMAL HIGH (ref 70–99)
Potassium: 4.5 mmol/L (ref 3.5–5.1)
Sodium: 135 mmol/L (ref 135–145)

## 2018-05-02 MED ORDER — LEVALBUTEROL HCL 0.63 MG/3ML IN NEBU: 0.6300 mg | INHALATION_SOLUTION | Freq: Three times a day (TID) | RESPIRATORY_TRACT | Status: DC | PRN

## 2018-05-02 MED ORDER — DOCUSATE SODIUM 100 MG PO CAPS: 100.0000 mg | ORAL_CAPSULE | Freq: Two times a day (BID) | ORAL | Status: DC | PRN

## 2018-05-02 MED ORDER — METOPROLOL TARTRATE 25 MG PO TABS
25.0000 mg | ORAL_TABLET | Freq: Two times a day (BID) | ORAL | Status: DC
  Administered 2018-05-02 – 2018-05-06 (×9): 25 mg via ORAL
  Filled 2018-05-02 (×9): qty 1

## 2018-05-02 NOTE — Progress Notes (Addendum)
      [address]       [place] [address]             [phone]      9 Days Post-Op Procedure(s) (LRB): VIDEO ASSISTED THORACOSCOPY (VATS)/WEDGE RESECTION OF RIGHT UPPER LUNG (Right) RIGHT UPPER LOBECTOMY (Right) Right Lateral Chest Tube Insertion (Right) MEDIASTINAL NODE DISSECTION (Right) Subjective: Nausea/vomiting, and dizzy this morning.   Objective: Vital signs in last 24 hours: Temp:  [97.7 F (36.5 C)-98.1 F (36.7 C)] 97.7 F (36.5 C) (03/04 0448) Pulse Rate:  [65-72] 65 (03/04 0448) Cardiac Rhythm: Normal sinus rhythm (03/04 0700) Resp:  [14-19] 15 (03/04 0448) BP: (135-150)/(50-65) 149/60 (03/04 0448) SpO2:  [93 %-95 %] 93 % (03/04 0732)    Intake/Output from previous day: 03/03 0701 - 03/04 0700 In: 720 [P.O.:720] Out: 90 [Chest Tube:90] Intake/Output this shift: No intake/output data recorded.  General appearance: alert, cooperative and mild distress Heart: regular rate and rhythm, S1, S2 normal, no murmur, click, rub or gallop Lungs: clear to auscultation bilaterally Abdomen: soft, nontender Extremities: extremities normal, atraumatic, no cyanosis or edema Wound: clean and dry  Lab Results: No results for input(s): WBC, HGB, HCT, PLT in the last 72 hours. BMET:  Recent Labs    04/30/18 0236  NA 138  K 4.0  CL 99  CO2 28  GLUCOSE 106*  BUN 12  CREATININE 0.67  CALCIUM 8.3*    PT/INR: No results for input(s): LABPROT, INR in the last 72 hours. ABG    Component Value Date/Time   PHART 7.354 04/24/2018 0555   HCO3 26.3 04/24/2018 0555   TCO2 31 04/10/2018 1118   ACIDBASEDEF TEST WILL BE CREDITED 04/19/2018 0930   O2SAT 98.6 04/24/2018 0555   CBG (last 3)  No results for input(s): GLUCAP in the last 72 hours.  Assessment/Plan: S/P Procedure(s) (LRB): VIDEO ASSISTED THORACOSCOPY (VATS)/WEDGE RESECTION OF RIGHT UPPER LUNG (Right) RIGHT UPPER LOBECTOMY (Right) Right Lateral Chest Tube Insertion (Right) MEDIASTINAL  NODE DISSECTION (Right)  1. CV-NSR in the 60s, BP well controlled 2. Pulm-tolerating room air with good oxygen saturation. Chest tube in place on water seal. + air leak with cough. CXR ordered.  3. Renal-no new labs. Will order a BMP since the patient has been vomiting. 4. Nausea/vomiting-discontinued oxycodone. Treating with IV zofran. BRAT diet if she can tolerate. BMP ordered.  5.  Previous soreness below the knee is gone.   Plan: keep chest tube for now. Work on nausea/vomiting. Await chest xray results and labs.     LOS: 9 days    Elgie Collard 05/02/2018 CXR looks better this AM with smaller pneumo/ space Nausea persists- is only in the morning. Will stop amiodarone for now although I am not sure that is the cause. Will increase lopressor to 25 BID Change xopenex to PRN and stop albuterol  Remo Lipps C. Roxan Hockey, MD [facility] [phone]

## 2018-05-02 NOTE — Evaluation (Signed)
Physical Therapy Evaluation Patient Details Name: Hannah Green MRN: [id number] DOB: 04-06-1945 Today's Date: 05/02/2018   History of Present Illness  73 year old woman presents s/p VATS with Resection of R Upper lobe. Patient with history of tobacco abuse, COPD, breast cancer treated in 1999, rheumatoid arthritis, gastroesophageal reflux, vitamin D deficiency, and diastolic dysfunction without heart failure.  Clinical Impression  Patient seen for therapy assessment.  Mobilizing well with no noted focal deficits at this time. Modified independent for all aspects of mobility. Only using RW for convenience to support chest tube canister. Patient requires no physical assist and has been mobilizing with staff. Do not feel patient requires skilled acute intervention from PT stand point.  Encouraged patient to ask for nursing to manage wires so that patient can continue to mobilize ad lib. Acute PT will sign off.    Follow Up Recommendations No PT follow up;Supervision - Intermittent    Equipment Recommendations  Rolling walker with 5" wheels    Recommendations for Other Services       Precautions / Restrictions Precautions Precaution Comments: chest tube      Mobility  Bed Mobility Overal bed mobility: Independent                Transfers Overall transfer level: Modified independent Equipment used: Rolling walker (2 wheeled)             General transfer comment: RW used for convienience of holding chest tube canister  Ambulation/Gait Ambulation/Gait assistance: Modified independent (Device/Increase time) Gait Distance (Feet): 340 Feet Assistive device: Rolling walker (2 wheeled) Gait Pattern/deviations: Step-through pattern;WFL(Within Functional Limits)     General Gait Details: steady with ambulation, use of RW for conveinience to hold chest tube canister  Stairs            Wheelchair Mobility    Modified Rankin (Stroke Patients Only)       Balance  Overall balance assessment: Mild deficits observed, not formally tested                                           Pertinent Vitals/Pain Pain Assessment: Faces Faces Pain Scale: Hurts a little bit Pain Location: chest tube insertion Pain Descriptors / Indicators: Discomfort Pain Intervention(s): Monitored during session    Home Living Family/patient expects to be discharged to:: Private residence Living Arrangements: Alone Available Help at Discharge: Family Type of Home: House Home Access: Level entry     Home Layout: One level Home Equipment: None      Prior Function Level of Independence: Independent               Hand Dominance   Dominant Hand: Right    Extremity/Trunk Assessment   Upper Extremity Assessment Upper Extremity Assessment: Overall WFL for tasks assessed    Lower Extremity Assessment Lower Extremity Assessment: Overall WFL for tasks assessed       Communication   Communication: No difficulties  Cognition Arousal/Alertness: Awake/alert Behavior During Therapy: WFL for tasks assessed/performed Overall Cognitive Status: Within Functional Limits for tasks assessed                                        General Comments      Exercises     Assessment/Plan    PT Assessment Patent does  not need any further PT services  PT Problem List         PT Treatment Interventions      PT Goals (Current goals can be found in the Care Plan section)  Acute Rehab PT Goals Patient Stated Goal: to go home PT Goal Formulation: All assessment and education complete, DC therapy    Frequency     Barriers to discharge        Co-evaluation               AM-PAC PT "6 Clicks" Mobility  Outcome Measure Help needed turning from your back to your side while in a flat bed without using bedrails?: None Help needed moving from lying on your back to sitting on the side of a flat bed without using bedrails?:  None Help needed moving to and from a bed to a chair (including a wheelchair)?: None Help needed standing up from a chair using your arms (e.g., wheelchair or bedside chair)?: None Help needed to walk in hospital room?: None Help needed climbing 3-5 steps with a railing? : A Little 6 Click Score: 23    End of Session   Activity Tolerance: Patient tolerated treatment well Patient left: in bed;with call bell/phone within reach Nurse Communication: Mobility status PT Visit Diagnosis: Difficulty in walking, not elsewhere classified (R26.2)    Time: 6151-8343 PT Time Calculation (min) (ACUTE ONLY): 21 min   Charges:   PT Evaluation $PT Eval Moderate Complexity: 1 Mod          Alben Deeds, PT DPT  Board Certified Neurologic Specialist Acute Rehabilitation Services Pager [phone] Office [phone]   Duncan Dull 05/02/2018, 3:07 PM

## 2018-05-03 ENCOUNTER — Inpatient Hospital Stay (HOSPITAL_COMMUNITY): Payer: Medicare HMO

## 2018-05-03 NOTE — Care Management Important Message (Signed)
Important Message  Patient Details  Name: Hannah Green MRN: [id number] Date of Birth: 05/08/1945   Medicare Important Message Given:  Yes    [name] P [place] 05/03/2018, 1:18 PM

## 2018-05-03 NOTE — Discharge Instructions (Signed)
Thoracoscopy, Care After  This sheet gives you information about how to care for yourself after your procedure. Your health care provider may also give you more specific instructions. If you have problems or questions, contact your health care provider.  What can I expect after the procedure?  After the procedure, it is common to have pain and soreness in the surgical area.  Follow these instructions at home:  Incision care     Follow instructions from your health care provider about how to take care of your incision. Make sure you:  ? Wash your hands with soap and water before you change your bandage (dressing). If soap and water are not available, use hand sanitizer.  ? Change your dressing as told by your health care provider.  ? Leave stitches (sutures), skin glue, or adhesive strips in place. These skin closures may need to stay in place for 2 weeks or longer. If adhesive strip edges start to loosen and curl up, you may trim the loose edges. Do not remove adhesive strips completely unless your health care provider tells you to do that.   Check your incision areas every day for signs of infection. Check for:  ? Redness, swelling, or pain.  ? Fluid or blood.  ? Warmth.  ? Pus or a bad smell.   Do not take baths, swim, or use a hot tub until your health care provider approves. You may take showers.  Medicines   Take over-the-counter and prescription medicines only as told by your health care provider.   If you were prescribed an antibiotic medicine, take it as told by your health care provider. Do not stop taking the antibiotic even if you start to feel better.   Do not drive or use heavy machinery while taking prescription pain medicine.   If you are taking prescription pain medicine, take actions to prevent or treat constipation. Your health care provider may recommend that you:  ? Drink enough fluid to keep your urine pale yellow.  ? Eat foods that are high in fiber, such as fresh fruits and vegetables,  whole grains, and beans.  ? Limit foods that are high in fat and processed sugars, such as fried and sweet foods.  ? Take an over-the-counter or prescription medicine for constipation.  Managing pain, stiffness, and swelling     If directed, put ice on the affected area:  ? Put ice in a plastic bag.  ? Place a towel between your skin and the bag.  ? Leave the ice on for 20 minutes, 2-3 times a day.  Preventing lung infection   To prevent pneumonia and to keep your lungs healthy:  ? Try to cough often. If it hurts to cough, hold a pillow against your chest as you cough.  ? Take deep breaths or do breathing exercises as instructed by your health care provider.  ? If you were given an incentive spirometer, use it as directed by your health care provider.  General instructions   Do not lift anything that is heavier than 10 lb (4.5 kg), or the limit that you are told, until your health care provider says that it is safe.   Do not use any products that contain nicotine or tobacco, such as cigarettes and e-cigarettes. These can delay healing after surgery. If you need help quitting, ask your health care provider.   Avoid driving until your health care provider approves.   If you have a chest drainage tube, care for it   as instructed by your health care provider. Do not travel by airplane after the chest drainage tube is removed until your health care provider approves.   Keep all follow-up visits as told by your health care provider. This is important.  Contact a health care provider if:   You have a fever.   Pain medicines do not ease your pain.   You have redness, swelling, or increasing pain in your incision area.   You develop a cough that does not go away, or you are coughing up mucus that is yellow or green.  Get help right away if:   You have fluid, blood, or pus coming from your incision.   There is a bad smell coming from your incision or dressing.   You develop a rash.   You cough up blood.   You  develop light-headedness, or you feel faint.   You have difficulty breathing.   You develop chest pain.   Your heartbeat feels irregular or very fast.  These symptoms may represent a serious problem that is an emergency. Do not wait to see if the symptoms will go away. Get medical help right away. Call your local emergency services (911 in the [place] Do not drive yourself to the hospital.  Summary   Follow instructions from your health care provider about how to take care of your incision.   Do not drive or use heavy machinery while taking prescription pain medicine.   Leave stitches (sutures), skin glue, or adhesive strips in place.   Check your incision areas every day for signs of infection.  This information is not intended to replace advice given to you by your health care provider. Make sure you discuss any questions you have with your health care provider.  Document Released: 09/03/2004 Document Revised: 01/24/2017 Document Reviewed: 01/24/2017  Elsevier Interactive Patient Education  2019 [employer]

## 2018-05-03 NOTE — Plan of Care (Signed)
  Problem: Respiratory: Goal: Respiratory status will improve Outcome: Progressing   Problem: Pain Management: Goal: Pain level will decrease Outcome: Progressing

## 2018-05-03 NOTE — Progress Notes (Signed)
      [address]       [place] [address]             [phone]      10 Days Post-Op Procedure(s) (LRB): VIDEO ASSISTED THORACOSCOPY (VATS)/WEDGE RESECTION OF RIGHT UPPER LUNG (Right) RIGHT UPPER LOBECTOMY (Right) Right Lateral Chest Tube Insertion (Right) MEDIASTINAL NODE DISSECTION (Right) Subjective: Nausea is better this morning. Some pain from her chest tube site.   Objective: Vital signs in last 24 hours: Temp:  [97.8 F (36.6 C)-98.4 F (36.9 C)] 98.1 F (36.7 C) (03/05 0752) Pulse Rate:  [64-79] 65 (03/05 0922) Cardiac Rhythm: Normal sinus rhythm (03/05 0745) Resp:  [15-21] 16 (03/05 0859) BP: (146-164)/(46-79) 152/58 (03/05 0922) SpO2:  [95 %-98 %] 96 % (03/05 0859)     Intake/Output from previous day: 03/04 0701 - 03/05 0700 In: 240 [P.O.:240] Out: 80 [Chest Tube:80] Intake/Output this shift: Total I/O In: 360 [P.O.:360] Out: 0   General appearance: alert, cooperative and no distress Heart: regular rate and rhythm, S1, S2 normal, no murmur, click, rub or gallop Lungs: clear to auscultation bilaterally Abdomen: soft, non-tender; bowel sounds normal; no masses,  no organomegaly Extremities: extremities normal, atraumatic, no cyanosis or edema Wound: clean and dry  Lab Results: No results for input(s): WBC, HGB, HCT, PLT in the last 72 hours. BMET:  Recent Labs    05/02/18 0920  NA 135  K 4.5  CL 100  CO2 27  GLUCOSE 129*  BUN 16  CREATININE 0.65  CALCIUM 8.8*    PT/INR: No results for input(s): LABPROT, INR in the last 72 hours. ABG    Component Value Date/Time   PHART 7.354 04/24/2018 0555   HCO3 26.3 04/24/2018 0555   TCO2 31 04/10/2018 1118   ACIDBASEDEF TEST WILL BE CREDITED 04/19/2018 0930   O2SAT 98.6 04/24/2018 0555   CBG (last 3)  No results for input(s): GLUCAP in the last 72 hours.  Assessment/Plan: S/P Procedure(s) (LRB): VIDEO ASSISTED THORACOSCOPY (VATS)/WEDGE RESECTION OF RIGHT UPPER LUNG  (Right) RIGHT UPPER LOBECTOMY (Right) Right Lateral Chest Tube Insertion (Right) MEDIASTINAL NODE DISSECTION (Right)  1. CV- hypertensive at times. Was not on any medication pre-op. NSR in the 60s. Continue lopressor.  2. Pulm- tolerating room air with excellent saturation. Estimated 10% right pneumothorax on chest xray. Stable from yesterdays study.  3. Renal-creatinine 0.65, electrolytes okay, continue HCTZ 4. H and H  11.5/37.0 5. Nausea/vomiting-stopped amiodarone. Oxycodone discontinued. Use pain medication only as needed. Treating with IV zofran.   Plan: Chest tube still with air leak. CXR stable. Continue chest tube. Nausea better this morning. Encouraged ambulation today.    LOS: 10 days    Hannah Green 05/03/2018

## 2018-05-04 ENCOUNTER — Inpatient Hospital Stay (HOSPITAL_COMMUNITY): Payer: Medicare HMO

## 2018-05-04 MED ORDER — KETOROLAC TROMETHAMINE 15 MG/ML IJ SOLN
15.0000 mg | Freq: Four times a day (QID) | INTRAMUSCULAR | Status: DC | PRN
  Administered 2018-05-04 – 2018-05-06 (×5): 15 mg via INTRAVENOUS
  Filled 2018-05-04 (×5): qty 1

## 2018-05-04 NOTE — Progress Notes (Signed)
11 Days Post-Op Procedure(s) (LRB): VIDEO ASSISTED THORACOSCOPY (VATS)/WEDGE RESECTION OF RIGHT UPPER LUNG (Right) RIGHT UPPER LOBECTOMY (Right) Right Lateral Chest Tube Insertion (Right) MEDIASTINAL NODE DISSECTION (Right) Subjective: More pain over night  Objective: Vital signs in last 24 hours: Temp:  [97.7 F (36.5 C)-98.7 F (37.1 C)] 98.7 F (37.1 C) (03/06 1136) Pulse Rate:  [69-88] 75 (03/06 1136) Cardiac Rhythm: Normal sinus rhythm (03/06 1136) Resp:  [19-23] 20 (03/06 1136) BP: (141-163)/(59-73) 156/59 (03/06 1136) SpO2:  [93 %-100 %] 99 % (03/06 1136)  Hemodynamic parameters for last 24 hours:    Intake/Output from previous day: 03/05 0701 - 03/06 0700 In: 600 [P.O.:600] Out: 150 [Chest Tube:150] Intake/Output this shift: Total I/O In: 240 [P.O.:240] Out: 0   General appearance: alert, cooperative and no distress Neurologic: intact Heart: regular rate and rhythm Lungs: diminished breath sounds right base minimal air leak with repeated cough  Lab Results: No results for input(s): WBC, HGB, HCT, PLT in the last 72 hours. BMET:  Recent Labs    05/02/18 0920  NA 135  K 4.5  CL 100  CO2 27  GLUCOSE 129*  BUN 16  CREATININE 0.65  CALCIUM 8.8*    PT/INR: No results for input(s): LABPROT, INR in the last 72 hours. ABG    Component Value Date/Time   PHART 7.354 04/24/2018 0555   HCO3 26.3 04/24/2018 0555   TCO2 31 04/10/2018 1118   ACIDBASEDEF TEST WILL BE CREDITED 04/19/2018 0930   O2SAT 98.6 04/24/2018 0555   CBG (last 3)  No results for input(s): GLUCAP in the last 72 hours.  Assessment/Plan: S/P Procedure(s) (LRB): VIDEO ASSISTED THORACOSCOPY (VATS)/WEDGE RESECTION OF RIGHT UPPER LUNG (Right) RIGHT UPPER LOBECTOMY (Right) Right Lateral Chest Tube Insertion (Right) MEDIASTINAL NODE DISSECTION (Right) -She has had more pain over night and today. Likely due to irritation from the chest tube. Will add PRN toradol Nausea has improved since  amiodarone stopped Air leak is dramatically improved- minimal bubble with repeated cough. CXR improved as well. Continue ambulation   LOS: 11 days    Melrose Nakayama 05/04/2018

## 2018-05-04 NOTE — Progress Notes (Signed)
Complained of burning sensation on left upper arm iv site after toradol iv given. No redness nor swelling noted. ice pack applied x 20 min continue to monitor.

## 2018-05-04 NOTE — Progress Notes (Signed)
11 Days Post-Op Procedure(s) (LRB): VIDEO ASSISTED THORACOSCOPY (VATS)/WEDGE RESECTION OF RIGHT UPPER LUNG (Right) RIGHT UPPER LOBECTOMY (Right) Right Lateral Chest Tube Insertion (Right) MEDIASTINAL NODE DISSECTION (Right) Subjective: Pain from chest tube  Objective: Vital signs in last 24 hours: Temp:  [97.7 F (36.5 C)-98.7 F (37.1 C)] 98.7 F (37.1 C) (03/06 1511) Pulse Rate:  [69-88] 81 (03/06 1511) Cardiac Rhythm: Normal sinus rhythm (03/06 1136) Resp:  [19-26] 26 (03/06 1511) BP: (141-163)/(56-73) 162/56 (03/06 1511) SpO2:  [93 %-100 %] 96 % (03/06 1511)  Hemodynamic parameters for last 24 hours:    Intake/Output from previous day: 03/05 0701 - 03/06 0700 In: 600 [P.O.:600] Out: 150 [Chest Tube:150] Intake/Output this shift: Total I/O In: 480 [P.O.:480] Out: 0   General appearance: alert, cooperative and no distress Neurologic: intact Heart: regular rate and rhythm Lungs: diminished breath sounds right base Wound: clean and dry mininmal air leak intermittently with cough  Lab Results: No results for input(s): WBC, HGB, HCT, PLT in the last 72 hours. BMET:  Recent Labs    05/02/18 0920  NA 135  K 4.5  CL 100  CO2 27  GLUCOSE 129*  BUN 16  CREATININE 0.65  CALCIUM 8.8*    PT/INR: No results for input(s): LABPROT, INR in the last 72 hours. ABG    Component Value Date/Time   PHART 7.354 04/24/2018 0555   HCO3 26.3 04/24/2018 0555   TCO2 31 04/10/2018 1118   ACIDBASEDEF TEST WILL BE CREDITED 04/19/2018 0930   O2SAT 98.6 04/24/2018 0555   CBG (last 3)  No results for input(s): GLUCAP in the last 72 hours.  Assessment/Plan: S/P Procedure(s) (LRB): VIDEO ASSISTED THORACOSCOPY (VATS)/WEDGE RESECTION OF RIGHT UPPER LUNG (Right) RIGHT UPPER LOBECTOMY (Right) Right Lateral Chest Tube Insertion (Right) MEDIASTINAL NODE DISSECTION (Right) -Persistent air leak post bilobectomy Appears to have almost completely resolved, dramatic decrease over past 24  hours. CXr shows decreased apical space Hopefully air leak will resolve completely and tube can be removed tomorrow Continue ambulation   LOS: 11 days    Melrose Nakayama 05/04/2018

## 2018-05-04 NOTE — Progress Notes (Signed)
Encouraged to ambulate on the hallway for the 3rd time but claimed to do it tonight .

## 2018-05-04 NOTE — Progress Notes (Signed)
Ambulated along the hallway with front wheel walker tolerated 250 feet walk.

## 2018-05-04 NOTE — Care Management Note (Signed)
Case Management Note  Patient Details  Name: Hannah Green MRN: [id number] Date of Birth: Jun 13, 1945  Subjective/Objective:   Pt is s/p VATS                 Action/Plan:  PTA Independent from home.  Pt has PCP and denied barriers with paying for medications.  Pt independently ambulating in room.  CM will continue to follow for discharge needs   Expected Discharge Date:  04/27/18               Expected Discharge Plan:  Home/Self Care  In-House Referral:     Discharge planning Services  CM Consult  Post Acute Care Choice:    Choice offered to:     DME Arranged:    DME Agency:     HH Arranged:    HH Agency:     Status of Service:     If discussed at [employer], dates discussed:    Additional Comments: 05/04/2018 Pt continues to have CT.  Pt ambulating independently  Maryclare Labrador, RN 05/04/2018, 4:04 PM

## 2018-05-04 NOTE — Progress Notes (Signed)
Ambulated along the hallway at 250 feet, tolerated well.

## 2018-05-05 ENCOUNTER — Inpatient Hospital Stay (HOSPITAL_COMMUNITY): Payer: Medicare HMO

## 2018-05-05 NOTE — Progress Notes (Addendum)
12 Days Post-Op Procedure(s) (LRB): VIDEO ASSISTED THORACOSCOPY (VATS)/WEDGE RESECTION OF RIGHT UPPER LUNG (Right) RIGHT UPPER LOBECTOMY (Right) Right Lateral Chest Tube Insertion (Right) MEDIASTINAL NODE DISSECTION (Right) Subjective: Still having some discomfort at the chest tube insertion site as well as her left upper arm IV.  Denies shortness of breath.  Mobility improving.  Objective: Vital signs in last 24 hours: Temp:  [98.1 F (36.7 C)-99.1 F (37.3 C)] 99.1 F (37.3 C) (03/07 0743) Pulse Rate:  [75-99] 95 (03/07 0743) Cardiac Rhythm: Normal sinus rhythm (03/07 0700) Resp:  [19-26] 20 (03/07 0743) BP: (131-162)/(52-59) 140/53 (03/07 0743) SpO2:  [93 %-99 %] 93 % (03/07 0743)      Intake/Output from previous day: 03/06 0701 - 03/07 0700 In: 720 [P.O.:720] Out: 80 [Chest Tube:80] Intake/Output this shift: Total I/O In: 240 [P.O.:240] Out: 0    General appearance: alert, cooperative and no distress Neurologic: intact Heart: regular rate and rhythm Lungs: diminished breath sounds right base Wound: clean and dry No air leak is seen with spontaneous breathing nor with cough.  Lab Results: No results for input(s): WBC, HGB, HCT, PLT in the last 72 hours. BMET:  Recent Labs    05/02/18 0920  NA 135  K 4.5  CL 100  CO2 27  GLUCOSE 129*  BUN 16  CREATININE 0.65  CALCIUM 8.8*    PT/INR: No results for input(s): LABPROT, INR in the last 72 hours. ABG    Component Value Date/Time   PHART 7.354 04/24/2018 0555   HCO3 26.3 04/24/2018 0555   TCO2 31 04/10/2018 1118   ACIDBASEDEF TEST WILL BE CREDITED 04/19/2018 0930   O2SAT 98.6 04/24/2018 0555   CBG (last 3)  No results for input(s): GLUCAP in the last 72 hours.  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  Air leak has resolved.  Chest x-ray stable.  Will remove the chest tube today and recheck her chest x-ray later this morning.      LOS: 12 days    Antony Odea, PA-C [phone] 05/05/2018  I have seen and examined the patient and agree with the assessment and plan as outlined.  Still having soreness in chest but hopeful that she might go home tomorrow.  D/C tube.  Rexene Alberts, MD 05/05/2018 10:24 AM

## 2018-05-06 ENCOUNTER — Inpatient Hospital Stay (HOSPITAL_COMMUNITY): Payer: Medicare HMO

## 2018-05-06 MED ORDER — LEVOFLOXACIN 500 MG PO TABS: 500.0000 mg | ORAL_TABLET | Freq: Every day | ORAL | 0 refills | Status: DC

## 2018-05-06 MED ORDER — POLYETHYLENE GLYCOL 3350 17 G PO PACK
17.0000 g | PACK | Freq: Every day | ORAL | Status: DC | PRN
  Administered 2018-05-06: 17 g via ORAL

## 2018-05-06 MED ORDER — HYDROCHLOROTHIAZIDE 12.5 MG PO CAPS: 12.5000 mg | ORAL_CAPSULE | Freq: Every day | ORAL | 1 refills | Status: DC

## 2018-05-06 MED ORDER — LEVOFLOXACIN 500 MG PO TABS: 500.0000 mg | ORAL_TABLET | Freq: Every day | ORAL | 0 refills | Status: AC

## 2018-05-06 MED ORDER — TRAMADOL HCL 50 MG PO TABS: 50.0000 mg | ORAL_TABLET | Freq: Four times a day (QID) | ORAL | 0 refills | Status: AC | PRN

## 2018-05-06 MED ORDER — METOPROLOL TARTRATE 25 MG PO TABS: 25.0000 mg | ORAL_TABLET | Freq: Two times a day (BID) | ORAL | 2 refills | Status: DC

## 2018-05-06 MED ORDER — ONDANSETRON HCL 4 MG PO TABS: 4.0000 mg | ORAL_TABLET | Freq: Three times a day (TID) | ORAL | 1 refills | Status: DC | PRN

## 2018-05-06 NOTE — Care Management (Signed)
Discussed d/c plan with patient and son.  They request and RW and a 3n1.  They prefer not to wait and will pick up from [facility] store tomorrow.  Pt given printed order to take to store to obtain equipment.

## 2018-05-06 NOTE — Progress Notes (Addendum)
13 Days Post-Op Procedure(s) (LRB): VIDEO ASSISTED THORACOSCOPY (VATS)/WEDGE RESECTION OF RIGHT UPPER LUNG (Right) RIGHT UPPER LOBECTOMY (Right) Right Lateral Chest Tube Insertion (Right) MEDIASTINAL NODE DISSECTION (Right) Subjective: Rested well last night. No new problems. Denies shortness of breath on RA.  Objective: Vital signs in last 24 hours: Temp:  [98 F (36.7 C)-98.7 F (37.1 C)] 98 F (36.7 C) (03/08 0752) Pulse Rate:  [75-91] 86 (03/08 0834) Cardiac Rhythm: Normal sinus rhythm (03/08 0700) Resp:  [16-24] 16 (03/08 0752) BP: (123-154)/(57-78) 154/59 (03/08 0834) SpO2:  [92 %-98 %] 93 % (03/08 0834)    Intake/Output from previous day: 03/07 0701 - 03/08 0700 In: 1680 [P.O.:1680] Out: 1 [Urine:1] Intake/Output this shift: No intake/output data recorded.  General appearance:alert, cooperative and no distress Neurologic:intact Heart:regular rate and rhythm Lungs:diminished breath soundsright base Wound:clean and dry, there is some erythema and induration around the CT insertion site.  Lab Results: No results for input(s): WBC, HGB, HCT, PLT in the last 72 hours. BMET: No results for input(s): NA, K, CL, CO2, GLUCOSE, BUN, CREATININE, CALCIUM in the last 72 hours.  PT/INR: No results for input(s): LABPROT, INR in the last 72 hours. ABG    Component Value Date/Time   PHART 7.354 04/24/2018 0555   HCO3 26.3 04/24/2018 0555   TCO2 31 04/10/2018 1118   ACIDBASEDEF TEST WILL BE CREDITED 04/19/2018 0930   O2SAT 98.6 04/24/2018 0555   CBG (last 3)  No results for input(s): GLUCAP in the last 72 hours.  Assessment/Plan: S/P Procedure(s) (LRB): VIDEO ASSISTED THORACOSCOPY (VATS)/WEDGE RESECTION OF RIGHT UPPER LUNG (Right) RIGHT UPPER LOBECTOMY (Right) Right Lateral Chest Tube Insertion (Right) MEDIASTINAL NODE DISSECTION (Right)   -Postop day 13 right upper lobectomy for pathologic stage T2aN0 squamous cell carcinoma.  Chest tube removed yesterday after a  prolonged air leak. CXR stable with no PTX. CT insertion site is inflamed, tender, and indurated.  Will discharge on oral Keflex and follow up in the office  for wound check and suture removal  next week.   LOS: 13 days    Antony Odea, PA-C [phone] 05/06/2018  I have seen and examined the patient and agree with the assessment and plan as outlined.  CXR stable. D/C home.  Rexene Alberts, MD 05/06/2018 11:22 AM

## 2018-05-08 ENCOUNTER — Inpatient Hospital Stay (HOSPITAL_COMMUNITY)
Admission: AD | Admit: 2018-05-08 | Discharge: 2018-05-11 | DRG: 194 | Disposition: A | Discharge status: Home Health | Payer: Medicare HMO | Source: Ambulatory Visit | Attending: Thoracic Surgery (Cardiothoracic Vascular Surgery) | Admitting: Thoracic Surgery (Cardiothoracic Vascular Surgery)

## 2018-05-08 ENCOUNTER — Inpatient Hospital Stay: Payer: Self-pay

## 2018-05-08 ENCOUNTER — Encounter: Payer: Self-pay | Admitting: Thoracic Surgery (Cardiothoracic Vascular Surgery)

## 2018-05-08 ENCOUNTER — Other Ambulatory Visit: Payer: Self-pay

## 2018-05-08 ENCOUNTER — Other Ambulatory Visit: Payer: Self-pay | Admitting: *Deleted

## 2018-05-08 ENCOUNTER — Ambulatory Visit
Admission: RE | Admit: 2018-05-08 | Discharge: 2018-05-08 | Disposition: A | Discharge status: Home / Self Care | Payer: Medicare HMO | Source: Ambulatory Visit | Attending: Thoracic Surgery (Cardiothoracic Vascular Surgery) | Admitting: Thoracic Surgery (Cardiothoracic Vascular Surgery)

## 2018-05-08 ENCOUNTER — Ambulatory Visit (INDEPENDENT_AMBULATORY_CARE_PROVIDER_SITE_OTHER): Payer: Self-pay | Admitting: Thoracic Surgery (Cardiothoracic Vascular Surgery)

## 2018-05-08 ENCOUNTER — Telehealth: Payer: Self-pay

## 2018-05-08 VITALS — BP 124/64 | HR 105 | Temp 98.2°F | Resp 18 | Ht 66.5 in | Wt 215.0 lb

## 2018-05-08 DIAGNOSIS — J181 Lobar pneumonia, unspecified organism: Principal | ICD-10-CM | POA: Diagnosis present

## 2018-05-08 DIAGNOSIS — Z79899 Other long term (current) drug therapy: Secondary | ICD-10-CM | POA: Diagnosis not present

## 2018-05-08 DIAGNOSIS — L03313 Cellulitis of chest wall: Secondary | ICD-10-CM | POA: Diagnosis not present

## 2018-05-08 DIAGNOSIS — Z880 Allergy status to penicillin: Secondary | ICD-10-CM

## 2018-05-08 DIAGNOSIS — Z7982 Long term (current) use of aspirin: Secondary | ICD-10-CM | POA: Diagnosis not present

## 2018-05-08 DIAGNOSIS — Z808 Family history of malignant neoplasm of other organs or systems: Secondary | ICD-10-CM

## 2018-05-08 DIAGNOSIS — Y95 Nosocomial condition: Secondary | ICD-10-CM | POA: Diagnosis present

## 2018-05-08 DIAGNOSIS — Z7951 Long term (current) use of inhaled steroids: Secondary | ICD-10-CM

## 2018-05-08 DIAGNOSIS — Z902 Acquired absence of lung [part of]: Secondary | ICD-10-CM

## 2018-05-08 DIAGNOSIS — I2510 Atherosclerotic heart disease of native coronary artery without angina pectoris: Secondary | ICD-10-CM | POA: Diagnosis present

## 2018-05-08 DIAGNOSIS — Z961 Presence of intraocular lens: Secondary | ICD-10-CM | POA: Diagnosis present

## 2018-05-08 DIAGNOSIS — Z9841 Cataract extraction status, right eye: Secondary | ICD-10-CM | POA: Diagnosis not present

## 2018-05-08 DIAGNOSIS — I10 Essential (primary) hypertension: Secondary | ICD-10-CM | POA: Diagnosis present

## 2018-05-08 DIAGNOSIS — Z9842 Cataract extraction status, left eye: Secondary | ICD-10-CM

## 2018-05-08 DIAGNOSIS — Y838 Other surgical procedures as the cause of abnormal reaction of the patient, or of later complication, without mention of misadventure at the time of the procedure: Secondary | ICD-10-CM | POA: Diagnosis present

## 2018-05-08 DIAGNOSIS — Z9049 Acquired absence of other specified parts of digestive tract: Secondary | ICD-10-CM | POA: Diagnosis not present

## 2018-05-08 DIAGNOSIS — M069 Rheumatoid arthritis, unspecified: Secondary | ICD-10-CM | POA: Diagnosis present

## 2018-05-08 DIAGNOSIS — R7303 Prediabetes: Secondary | ICD-10-CM | POA: Diagnosis not present

## 2018-05-08 DIAGNOSIS — K219 Gastro-esophageal reflux disease without esophagitis: Secondary | ICD-10-CM | POA: Diagnosis not present

## 2018-05-08 DIAGNOSIS — C3491 Malignant neoplasm of unspecified part of right bronchus or lung: Secondary | ICD-10-CM | POA: Diagnosis not present

## 2018-05-08 DIAGNOSIS — E785 Hyperlipidemia, unspecified: Secondary | ICD-10-CM | POA: Diagnosis not present

## 2018-05-08 DIAGNOSIS — Z853 Personal history of malignant neoplasm of breast: Secondary | ICD-10-CM

## 2018-05-08 DIAGNOSIS — T8149XA Infection following a procedure, other surgical site, initial encounter: Secondary | ICD-10-CM | POA: Diagnosis not present

## 2018-05-08 DIAGNOSIS — Z09 Encounter for follow-up examination after completed treatment for conditions other than malignant neoplasm: Secondary | ICD-10-CM

## 2018-05-08 DIAGNOSIS — J189 Pneumonia, unspecified organism: Secondary | ICD-10-CM | POA: Diagnosis present

## 2018-05-08 DIAGNOSIS — Z79891 Long term (current) use of opiate analgesic: Secondary | ICD-10-CM

## 2018-05-08 DIAGNOSIS — Z91048 Other nonmedicinal substance allergy status: Secondary | ICD-10-CM | POA: Diagnosis not present

## 2018-05-08 DIAGNOSIS — R0602 Shortness of breath: Secondary | ICD-10-CM

## 2018-05-08 DIAGNOSIS — Z888 Allergy status to other drugs, medicaments and biological substances status: Secondary | ICD-10-CM | POA: Diagnosis not present

## 2018-05-08 DIAGNOSIS — Z833 Family history of diabetes mellitus: Secondary | ICD-10-CM

## 2018-05-08 DIAGNOSIS — Z85828 Personal history of other malignant neoplasm of skin: Secondary | ICD-10-CM | POA: Diagnosis not present

## 2018-05-08 DIAGNOSIS — Z801 Family history of malignant neoplasm of trachea, bronchus and lung: Secondary | ICD-10-CM

## 2018-05-08 DIAGNOSIS — Z87891 Personal history of nicotine dependence: Secondary | ICD-10-CM

## 2018-05-08 DIAGNOSIS — J449 Chronic obstructive pulmonary disease, unspecified: Secondary | ICD-10-CM | POA: Diagnosis not present

## 2018-05-08 DIAGNOSIS — L0390 Cellulitis, unspecified: Secondary | ICD-10-CM

## 2018-05-08 DIAGNOSIS — J90 Pleural effusion, not elsewhere classified: Secondary | ICD-10-CM | POA: Diagnosis not present

## 2018-05-08 LAB — BASIC METABOLIC PANEL
ANION GAP: 11 (ref 5–15)
BUN: 19 mg/dL (ref 8–23)
CO2: 26 mmol/L (ref 22–32)
Calcium: 8.7 mg/dL — ABNORMAL LOW (ref 8.9–10.3)
Chloride: 98 mmol/L (ref 98–111)
Creatinine, Ser: 0.78 mg/dL (ref 0.44–1.00)
GFR calc Af Amer: >60 mL/min (ref >60)
GFR calc non Af Amer: >60 mL/min (ref >60)
Glucose, Bld: 132 mg/dL — ABNORMAL HIGH (ref 70–99)
Potassium: 3.7 mmol/L (ref 3.5–5.1)
Sodium: 135 mmol/L (ref 135–145)

## 2018-05-08 LAB — CBC
HCT: 35.8 % — ABNORMAL LOW (ref 36.0–46.0)
HEMOGLOBIN: 11.6 g/dL — AB (ref 12.0–15.0)
MCH: 30 pg (ref 26.0–34.0)
MCHC: 32.4 g/dL (ref 30.0–36.0)
MCV: 92.5 fL (ref 80.0–100.0)
Platelets: 531 10*3/uL — ABNORMAL HIGH (ref 150–400)
RBC: 3.87 MIL/uL (ref 3.87–5.11)
RDW: 13.9 % (ref 11.5–15.5)
WBC: 16.7 10*3/uL — ABNORMAL HIGH (ref 4.0–10.5)
nRBC: 0 % (ref 0.0–0.2)

## 2018-05-08 MED ORDER — DOCUSATE SODIUM 100 MG PO CAPS
100.0000 mg | ORAL_CAPSULE | Freq: Two times a day (BID) | ORAL | Status: DC | PRN
  Administered 2018-05-08 – 2018-05-10 (×3): 100 mg via ORAL
  Filled 2018-05-08 (×3): qty 1

## 2018-05-08 MED ORDER — VANCOMYCIN HCL 10 G IV SOLR
1750.0000 mg | INTRAVENOUS | Status: DC
  Administered 2018-05-09 – 2018-05-11 (×3): 1750 mg via INTRAVENOUS
  Filled 2018-05-08 (×3): qty 1750

## 2018-05-08 MED ORDER — MOMETASONE FURO-FORMOTEROL FUM 200-5 MCG/ACT IN AERO
2.0000 | INHALATION_SPRAY | Freq: Two times a day (BID) | RESPIRATORY_TRACT | Status: DC
  Administered 2018-05-08 – 2018-05-11 (×5): 2 via RESPIRATORY_TRACT
  Filled 2018-05-08: qty 8.8

## 2018-05-08 MED ORDER — ONDANSETRON HCL 4 MG/2ML IJ SOLN: 4.0000 mg | Freq: Four times a day (QID) | INTRAMUSCULAR | Status: DC | PRN

## 2018-05-08 MED ORDER — GUAIFENESIN ER 600 MG PO TB12: 1200.0000 mg | ORAL_TABLET | Freq: Two times a day (BID) | ORAL | Status: DC | PRN

## 2018-05-08 MED ORDER — ALBUTEROL SULFATE (2.5 MG/3ML) 0.083% IN NEBU
2.5000 mg | INHALATION_SOLUTION | Freq: Four times a day (QID) | RESPIRATORY_TRACT | Status: DC | PRN
  Administered 2018-05-10: 2.5 mg via RESPIRATORY_TRACT
  Filled 2018-05-08: qty 3

## 2018-05-08 MED ORDER — VANCOMYCIN HCL 10 G IV SOLR
2000.0000 mg | Freq: Once | INTRAVENOUS | Status: AC
  Administered 2018-05-08: 2000 mg via INTRAVENOUS
  Filled 2018-05-08: qty 2000

## 2018-05-08 MED ORDER — ASPIRIN EC 81 MG PO TBEC
81.0000 mg | DELAYED_RELEASE_TABLET | Freq: Every day | ORAL | Status: DC
  Administered 2018-05-09 – 2018-05-11 (×3): 81 mg via ORAL
  Filled 2018-05-08 (×3): qty 1

## 2018-05-08 MED ORDER — METOPROLOL TARTRATE 25 MG PO TABS
25.0000 mg | ORAL_TABLET | Freq: Two times a day (BID) | ORAL | Status: DC
  Administered 2018-05-08 – 2018-05-11 (×7): 25 mg via ORAL
  Filled 2018-05-08 (×7): qty 1

## 2018-05-08 MED ORDER — LEVOFLOXACIN 750 MG PO TABS
750.0000 mg | ORAL_TABLET | Freq: Every day | ORAL | Status: DC
  Administered 2018-05-08 – 2018-05-11 (×4): 750 mg via ORAL
  Filled 2018-05-08 (×4): qty 1

## 2018-05-08 MED ORDER — ACETAMINOPHEN 325 MG PO TABS
650.0000 mg | ORAL_TABLET | Freq: Four times a day (QID) | ORAL | Status: DC | PRN
  Administered 2018-05-09 – 2018-05-11 (×5): 650 mg via ORAL
  Filled 2018-05-08 (×5): qty 2

## 2018-05-08 MED ORDER — ATORVASTATIN CALCIUM 10 MG PO TABS
10.0000 mg | ORAL_TABLET | Freq: Every day | ORAL | Status: DC
  Administered 2018-05-08 – 2018-05-11 (×4): 10 mg via ORAL
  Filled 2018-05-08 (×4): qty 1

## 2018-05-08 NOTE — Progress Notes (Signed)
[address]       [place] [address]             [phone]       HPI: Mrs. 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  She had a prolonged air leak postoperatively but it ultimately resolved and her chest tube was removed.  She was discharged on Sunday.  She was discharged on Levaquin for an infection of the chest tube site.  She complains of pain around her chest tube site and has redness there as well.  She says she is been having "hot flashes" and feels chilled at times.  She has not taken her temperature.  She says that she feels more short of breath than she did prior to discharge.  Past Medical History:  Diagnosis Date  . Breast cancer ([place]) 1999  . Cataract 2017   resolved with surgery  . Coronary artery disease   . GERD (gastroesophageal reflux disease)   . Hyperlipidemia   . Rheumatoid arthritis(714.0)     Current Outpatient Medications  Medication Sig Dispense Refill  . aspirin EC 81 MG tablet Take 1 tablet (81 mg total) by mouth daily. (Patient taking differently: Take 81 mg by mouth every evening. ) 90 tablet 3  . atorvastatin (LIPITOR) 10 MG tablet Take 1 tablet (10 mg total) by mouth daily. 90 tablet 3  . Cholecalciferol (VITAMIN D3) 1.25 MG (50000 UT) CAPS TAKE 1 CAPSULE BY MOUTH ONE TIME PER WEEK (Patient taking differently: Take 50,000 Units by mouth every Friday. ) 12 capsule 0  . Coenzyme Q10 (CO Q-10) 300 MG CAPS Take 300 mg by mouth daily.    . Fluticasone-Salmeterol (ADVAIR) 250-50 MCG/DOSE AEPB Inhale 1 puff into the lungs every 12 (twelve) hours. 60 each 11  . hydrochlorothiazide (MICROZIDE) 12.5 MG capsule Take 1 capsule (12.5 mg total) by mouth daily. 30 capsule 1  . levofloxacin (LEVAQUIN) 500 MG tablet Take 1 tablet (500 mg total) by mouth daily for 5 days. 5  tablet 0  . metoprolol tartrate (LOPRESSOR) 25 MG tablet Take 1 tablet (25 mg total) by mouth 2 (two) times daily. 60 tablet 2  . metroNIDAZOLE (METROGEL) 1 % gel Apply topically daily. (Patient taking differently: Apply 1 application topically daily as needed (rosacea). ) 30 g 1  . ondansetron (ZOFRAN) 4 MG tablet Take 1 tablet (4 mg total) by mouth every 8 (eight) hours as needed for nausea or vomiting. 10 tablet 1  . traMADol (ULTRAM) 50 MG tablet Take 1-2 tablets (50-100 mg total) by mouth every 6 (six) hours as needed for up to 5 days (mild pain). 40 tablet 0  . VENTOLIN HFA 108 (90 Base) MCG/ACT inhaler TAKE 2 PUFFS BY MOUTH EVERY 6 HOURS AS NEEDED FOR WHEEZE OR SHORTNESS OF BREATH (Patient taking differently: Inhale 2 puffs into the lungs every 6 (six) hours as needed for shortness of breath. ) 18 Inhaler 3   No current facility-administered medications for this visit.     Physical Exam BP 124/64 (BP Location: Right Arm, Patient Position: Sitting, Cuff Size: Large)   Pulse (!) 105   Temp 98.2 F (36.8 C) (Oral)   Resp 18   Ht 5' 6.5" (1.689 m)   Wt 215 lb (97.5 kg)   LMP  (LMP Unknown)  SpO2 95% Comment: RA  BMI 34.2 kg/m  73 year old woman in no acute distress but anxious Alert and oriented x3 with no focal deficits Lungs diminished at right base, otherwise clear Incision well-healed, chest tube site with erythema and induration  Diagnostic Tests: CHEST - 2 VIEW  COMPARISON:  Chest x-ray 05/06/2018  FINDINGS: The cardiac silhouette, mediastinal and hilar contours are within normal limits and stable. Stable tortuosity and calcification of the thoracic aorta.  New patchy airspace opacity in the right lower lobe could reflect infiltrate or aspiration. Possible small right effusion. The left lung is clear.  IMPRESSION: New patchy right lower lobe airspace process suspicious for infiltrate or aspiration. Small right pleural effusion also noted.  The left lung is  clear.   Electronically Signed   By: Marijo Sanes M.D.   On: 05/08/2018 11:11 I personally reviewed the chest x-ray images and concur with the findings noted above  Impression: Kenzlie Disch is a 73 year old woman who is about 2 weeks out from a thoracoscopic right upper lobectomy for a stage Ib squamous cell carcinoma.  She had a prolonged air leak which finally resolved over the weekend.  Her chest tube was removed on Saturday and she was discharged on Sunday.  However now 2 days later she feels more short of breath.  Her chest x-ray shows a developing right lower lobe pneumonia.  This is occurred even though she is on empiric Levaquin for cellulitis around her chest tube site.  I think we need to readmit her for IV antibiotics.  She is penicillin allergic so we will plan to use IV vancomycin and continue her p.o. Levaquin.   Plan: Will admit for IV antibiotics.  Penicillin allergic.  Will use IV vancomycin and continue p.o. Levaquin.Melrose Nakayama, MD [facility] [phone]

## 2018-05-08 NOTE — Progress Notes (Unsigned)
CXR

## 2018-05-08 NOTE — Telephone Encounter (Signed)
Admitted to [facility] [facility] room 2C14

## 2018-05-08 NOTE — Progress Notes (Signed)
Pharmacy Antibiotic Note  Hannah Green is a 73 y.o. female admitted on 05/08/2018 with pneumonia.  Pharmacy has been consulted for vancomycin dosing.  Concern for PNA - afebrile. CXR showing new patchy RLL airspace process suspicious for infiltrate or aspiration. WBC 16.7. Scr 0.78 (CrCl 73.9 mL/min).   Plan: Vancomycin 2 g IV once then 1750 mg IV every 24 hours  Monitor renal fx, cx results, clinical pic, and levels as appropriate   Height: 5' 6.5" (168.9 cm) Weight: 205 lb 11 oz (93.3 kg) IBW/kg (Calculated) : 60.45  Temp (24hrs), Avg:98.2 F (36.8 C), Min:98.2 F (36.8 C), Max:98.2 F (36.8 C)  Recent Labs  Lab 05/02/18 0920  CREATININE 0.65    Estimated Creatinine Clearance: 73.9 mL/min (by C-G formula based on SCr of 0.65 mg/dL).    Allergies  Allergen Reactions  . Adhesive [Tape] Rash    Paper tape causes rash  . Neosporin [Neomycin-Bacitracin Zn-Polymyx] Itching  . Penicillins Rash    Did it involve swelling of the face/tongue/throat, SOB, or low BP? No Did it involve sudden or severe rash/hives, skin peeling, or any reaction on the inside of your mouth or nose? Yes Did you need to seek medical attention at a hospital or doctor's office? Yes When did it last happen?childhood allergy If all above answers are "NO", may proceed with cephalosporin use.     Antimicrobials this admission: Vancomycin 3/10 >>  Levofloxacin 3/10 >>   Dose adjustments this admission: N/A  Microbiology results: N/A  Thank you for allowing pharmacy to be a part of this patient's care.  Antonietta Jewel, PharmD, [place] Clinical Pharmacist  Pager: [phone] Phone: [phone] 05/08/2018 3:14 PM

## 2018-05-08 NOTE — H&P (Addendum)
[address]       Hannah Green,[place] [address]             [phone]        Hannah Green [facility] Medical Record #[id number] Date of Birth: March 29, 1945  Primary Care: Hannah Sanders, MD  Chief Complaint:   Pneumonia  History of Present Illness:      [name] is a 73 yo white female well known to [facility].  She is recently underwent Right VATS with right upper lobectomy, right middle lobectomy and intercostal nerve block performed on 04/23/2018.  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  Her hospital stay was complicated by prolonged air leak, atrial fibrillation, and hypertension.  She converted to NSR with use of Amiodarone and remained in NSR at discharge.  Her chest tube was able to be removed and she was discharged home in stable condition on 05/06/2018.  At the time of discharge she was noted have evidence of a chest tube site infection and she was discharged home on Levaquin for this.  She presented to our office today for removal of chest tube stitch and wound check of her chest tube site.  However today she continues to complain of pain around her chest tube site.  She also states there is persistent redness present.  She admits to experiencing hot flashes and feeling "chilled" at times.  She did not take her temperature during these times.  She also notes that she is feeling more short of breath.  She was evaluated by Dr. Roxan Hockey who obtained a CXR which was concerning for development of a right lower lobar pneumonia, despite Levaquin therapy.  It was felt she would require admission to the hospital with treatment with IV ABX.     Past Medical History:  Diagnosis Date  . Breast cancer ([place]) 1999  . Cataract 2017   resolved with surgery  . Coronary artery disease   . GERD (gastroesophageal reflux disease)   . Hyperlipidemia   . Rheumatoid arthritis(714.0)     Past Surgical History:  Procedure Laterality Date   . BREAST LUMPECTOMY  1999   left, needed radiation  . CATARACT EXTRACTION W/ INTRAOCULAR LENS  IMPLANT, BILATERAL Bilateral 05/19/15, 06/02/15  . CHEST TUBE INSERTION Right 04/23/2018   Procedure: Right Lateral Chest Tube Insertion;  Surgeon: Melrose Nakayama, MD;  Location: [facility];  Service: Thoracic;  Laterality: Right;  . CHOLECYSTECTOMY    . LOBECTOMY Right 04/23/2018   Procedure: RIGHT UPPER LOBECTOMY;  Surgeon: Melrose Nakayama, MD;  Location: [facility];  Service: Thoracic;  Laterality: Right;  . NM MYOVIEW LTD  01/2016   LOW RISK. Hypertensive response to exercise followed by significant drop (stage I pressure 211/70 drop to 118/75. Stage II). Apical artifact but otherwise no ischemia or infarction. EF 64%.  Marland Kitchen NODE DISSECTION Right 04/23/2018   Procedure: MEDIASTINAL NODE DISSECTION;  Surgeon: Melrose Nakayama, MD;  Location: [facility];  Service: Thoracic;  Laterality: Right;  . RIGHT/LEFT HEART CATH AND CORONARY ANGIOGRAPHY N/A 04/10/2018   Procedure: RIGHT/LEFT HEART CATH AND CORONARY ANGIOGRAPHY;  Surgeon: Leonie Man, MD;  Location: [facility];; angiographically minimal coronary artery disease.  Normal LVEDP.  Basely normal RHC pressures with no suggestion of pulmonary hypertension.  Normal CO-CI by Fick (5.9-2.8)  . TONSILLECTOMY    . TRANSTHORACIC ECHOCARDIOGRAM  11/'17; 2/'20   a) Nov 2017: Mild LVH.  EF 60-65%. GR 2-DD; b) Feb 2020: Normal LV size and function.  EF 60 to 65%.  No R WMA.  GR 1 DD.  Normal RV size and function.  Normal pressures.  No valvular disease.  Marland Kitchen VIDEO ASSISTED THORACOSCOPY (VATS)/WEDGE RESECTION Right 04/23/2018   Procedure: VIDEO ASSISTED THORACOSCOPY (VATS)/WEDGE RESECTION OF RIGHT UPPER LUNG;  Surgeon: Melrose Nakayama, MD;  [facility];  Service: Thoracic;  Laterality: Right;    Social History   Tobacco Use  Smoking Status Former Smoker  . Packs/day: 1.50  . Years: 50.00  . Pack years: 75.00  . Types: Cigarettes  . Last  attempt to quit: 04/01/2011  . Years since quitting: 7.1  Smokeless Tobacco Never Used  Tobacco Comment   has stopped but has started again in "crisis"    Social History   Substance and Sexual Activity  Alcohol Use Yes  . Alcohol/week: 2.0 standard drinks  . Types: 2 Glasses of wine per week     Allergies  Allergen Reactions  . Adhesive [Tape] Rash    Paper tape causes rash  . Neosporin [Neomycin-Bacitracin Zn-Polymyx] Itching  . Penicillins Rash    Did it involve swelling of the face/tongue/throat, SOB, or low BP? No Did it involve sudden or severe rash/hives, skin peeling, or any reaction on the inside of your mouth or nose? Yes Did you need to seek medical attention at a hospital or doctor's office? Yes When did it last happen?childhood allergy If all above answers are "NO", may proceed with cephalosporin use.     Current Facility-Administered Medications  Medication Dose Route Frequency Provider Last Rate Last Dose  . acetaminophen (TYLENOL) tablet 650 mg  650 mg Oral Q6H PRN Barrett, Erin R, PA-C      . albuterol (PROVENTIL) (2.5 MG/3ML) 0.083% nebulizer solution 2.5 mg  2.5 mg Nebulization Q6H PRN Barrett, Erin R, PA-C      . [START ON 05/09/2018] aspirin EC tablet 81 mg  81 mg Oral Daily Barrett, Erin R, PA-C      . atorvastatin (LIPITOR) tablet 10 mg  10 mg Oral q1800 Barrett, Erin R, PA-C      . docusate sodium (COLACE) capsule 100 mg  100 mg Oral BID PRN Barrett, Erin R, PA-C      . guaiFENesin (MUCINEX) 12 hr tablet 1,200 mg  1,200 mg Oral BID PRN Barrett, Erin R, PA-C      . levofloxacin (LEVAQUIN) tablet 750 mg  750 mg Oral Daily Barrett, Erin R, PA-C      . metoprolol tartrate (LOPRESSOR) tablet 25 mg  25 mg Oral BID Barrett, Erin R, PA-C      . mometasone-formoterol (DULERA) 200-5 MCG/ACT inhaler 2 puff  2 puff Inhalation BID Barrett, Erin R, PA-C      . ondansetron (ZOFRAN) injection 4 mg  4 mg Intravenous Q6H PRN Barrett, Erin R, PA-C      . vancomycin  (VANCOCIN) 2,000 mg in sodium chloride 0.9 % 500 mL IVPB  2,000 mg Intravenous Once Melrose Nakayama, MD 250 mL/hr at 05/08/18 1559 2,000 mg at 05/08/18 1559    Medications Prior to Admission  Medication Sig Dispense Refill Last Dose  . aspirin EC 81 MG tablet Take 1 tablet (81 mg total) by mouth daily. (Patient taking differently: Take 81 mg by mouth every evening. ) 90 tablet 3 Taking  . atorvastatin (LIPITOR) 10 MG tablet Take 1 tablet (10 mg total) by mouth daily. 90 tablet 3 Taking  .  Cholecalciferol (VITAMIN D3) 1.25 MG (50000 UT) CAPS TAKE 1 CAPSULE BY MOUTH ONE TIME PER WEEK (Patient taking differently: Take 50,000 Units by mouth every Friday. ) 12 capsule 0 Taking  . Coenzyme Q10 (CO Q-10) 300 MG CAPS Take 300 mg by mouth daily.   Taking  . Fluticasone-Salmeterol (ADVAIR) 250-50 MCG/DOSE AEPB Inhale 1 puff into the lungs every 12 (twelve) hours. 60 each 11 Taking  . hydrochlorothiazide (MICROZIDE) 12.5 MG capsule Take 1 capsule (12.5 mg total) by mouth daily. 30 capsule 1 Taking  . levofloxacin (LEVAQUIN) 500 MG tablet Take 1 tablet (500 mg total) by mouth daily for 5 days. 5 tablet 0 Taking  . metoprolol tartrate (LOPRESSOR) 25 MG tablet Take 1 tablet (25 mg total) by mouth 2 (two) times daily. 60 tablet 2 Taking  . metroNIDAZOLE (METROGEL) 1 % gel Apply topically daily. (Patient taking differently: Apply 1 application topically daily as needed (rosacea). ) 30 g 1 Taking  . ondansetron (ZOFRAN) 4 MG tablet Take 1 tablet (4 mg total) by mouth every 8 (eight) hours as needed for nausea or vomiting. 10 tablet 1 Taking  . traMADol (ULTRAM) 50 MG tablet Take 1-2 tablets (50-100 mg total) by mouth every 6 (six) hours as needed for up to 5 days (mild pain). 40 tablet 0 Taking  . VENTOLIN HFA 108 (90 Base) MCG/ACT inhaler TAKE 2 PUFFS BY MOUTH EVERY 6 HOURS AS NEEDED FOR WHEEZE OR SHORTNESS OF BREATH (Patient taking differently: Inhale 2 puffs into the lungs every 6 (six) hours as needed for  shortness of breath. ) 18 Inhaler 3 Taking    Family History  Problem Relation Age of Onset  . Cancer Mother        breast  . Cancer Father        lung cancer  . Cancer Sister        throat cancer  . Diabetes Paternal Uncle   . Diabetes Maternal Grandmother       Physical Exam:  BP (!) 129/54 (BP Location: Right Arm)   Temp 98.4 F (36.9 C) (Oral)   Ht 5' 6.5" (1.689 m)   Wt 93.3 kg   LMP  (LMP Unknown)   BMI 32.70 kg/m    General appearance: alert, cooperative and no distress Head: Normocephalic, without obvious abnormality, atraumatic Resp: diminshed right base, otherwise clear Cardio: regular rate and rhythm GI: soft, non-tender; bowel sounds normal; no masses,  no organomegaly Extremities: extremities normal, atraumatic, no cyanosis or edema Neurologic: Grossly normal  Incision: surgical site is clean and dry, chest tube site is erythematous  Diagnostic Studies & Laboratory data:     Recent Radiology Findings:   Dg Chest 2 View  Result Date: 05/08/2018 CLINICAL DATA:  History of right-sided VATS 04/23/2018 patient with shortness of breath. EXAM: CHEST - 2 VIEW COMPARISON:  Chest x-ray 05/06/2018 FINDINGS: The cardiac silhouette, mediastinal and hilar contours are within normal limits and stable. Stable tortuosity and calcification of the thoracic aorta. New patchy airspace opacity in the right lower lobe could reflect infiltrate or aspiration. Possible small right effusion. The left lung is clear. IMPRESSION: New patchy right lower lobe airspace process suspicious for infiltrate or aspiration. Small right pleural effusion also noted. The left lung is clear. Electronically Signed   By: Marijo Sanes M.D.   On: 05/08/2018 11:11     I have independently reviewed the above radiologic studies and discussed with the patient   Recent Lab Findings: Lab Results  Component Value  Date   WBC 11.6 (H) 04/28/2018   HGB 11.5 (L) 04/28/2018   HCT 37.0 04/28/2018   PLT 326  04/28/2018   GLUCOSE 129 (H) 05/02/2018   CHOL 178 12/05/2017   TRIG 136.0 12/05/2017   HDL 65.30 12/05/2017   LDLDIRECT 136.2 06/03/2011   LDLCALC 85 12/05/2017   ALT 15 04/25/2018   AST 20 04/25/2018   NA 135 05/02/2018   K 4.5 05/02/2018   CL 100 05/02/2018   CREATININE 0.65 05/02/2018   BUN 16 05/02/2018   CO2 27 05/02/2018   TSH 2.469 04/28/2018   INR 0.95 04/19/2018   HGBA1C 5.6 12/12/2017    Assessment / Plan:      1. Admit to inpatient 2. S/P Right VATS with RUL, RML- now with increasing shortness of breath with HCAP in RLL 3. Cellulitis- chest tube site 4. CV- H/O A.fib, in NSR- resume Lopressor, watch BP if remains elevated can restart HCTZ 5. ID-  Afebrile,check CBC, BMET- will continue oral Levaquin, and start Vanc per pharmacy per Dr. Leonarda Salon recommendations  I  spent 55 minutes counseling the patient face to face.   Ellwood Handler PA-C [phone]   05/08/2018 4:17 PM  As above Returned to office today with worsening shortness of breath, increased pain at CT site. Has obvious cellulitis at CT site despite levaquin. Probable pneumonia on CXR. Admit for IV antibiotics  Revonda Standard. Roxan Hockey, MD [facility] [phone]

## 2018-05-09 ENCOUNTER — Inpatient Hospital Stay: Payer: Self-pay

## 2018-05-09 ENCOUNTER — Ambulatory Visit: Payer: Medicare HMO | Admitting: Cardiology

## 2018-05-09 LAB — CBC
HCT: 37 % (ref 36.0–46.0)
Hemoglobin: 11.9 g/dL — ABNORMAL LOW (ref 12.0–15.0)
MCH: 29.8 pg (ref 26.0–34.0)
MCHC: 32.2 g/dL (ref 30.0–36.0)
MCV: 92.5 fL (ref 80.0–100.0)
Platelets: 560 10*3/uL — ABNORMAL HIGH (ref 150–400)
RBC: 4 MIL/uL (ref 3.87–5.11)
RDW: 13.9 % (ref 11.5–15.5)
WBC: 14.4 10*3/uL — ABNORMAL HIGH (ref 4.0–10.5)
nRBC: 0 % (ref 0.0–0.2)

## 2018-05-09 LAB — BASIC METABOLIC PANEL
Anion gap: 8 (ref 5–15)
BUN: 19 mg/dL (ref 8–23)
CO2: 28 mmol/L (ref 22–32)
Calcium: 9.1 mg/dL (ref 8.9–10.3)
Chloride: 100 mmol/L (ref 98–111)
Creatinine, Ser: 0.77 mg/dL (ref 0.44–1.00)
GFR calc Af Amer: >60 mL/min (ref >60)
GFR calc non Af Amer: >60 mL/min (ref >60)
Glucose, Bld: 122 mg/dL — ABNORMAL HIGH (ref 70–99)
Potassium: 4 mmol/L (ref 3.5–5.1)
SODIUM: 136 mmol/L (ref 135–145)

## 2018-05-09 MED ORDER — ENOXAPARIN SODIUM 40 MG/0.4ML ~~LOC~~ SOLN
40.0000 mg | SUBCUTANEOUS | Status: DC
  Administered 2018-05-09 – 2018-05-11 (×3): 40 mg via SUBCUTANEOUS
  Filled 2018-05-09 (×3): qty 0.4

## 2018-05-09 MED ORDER — ENOXAPARIN SODIUM 30 MG/0.3ML ~~LOC~~ SOLN: 30.0000 mg | SUBCUTANEOUS | Status: DC

## 2018-05-09 NOTE — Discharge Summary (Signed)
Physician Discharge Summary  Patient ID: Hannah Green MRN: [id number] DOB/AGE: October 23, 1945 73 y.o.  Admit date: 05/08/2018 Discharge date: 05/11/2018  Admission Diagnoses:  Patient Active Problem List   Diagnosis Date Noted  . Cellulitis of chest tube site 05/08/2018  . HCAP (healthcare-associated pneumonia) 05/08/2018  . Non-small cell lung cancer ([place]) 04/24/2018  . S/P Right Upper Lobectomy, Right Middle Lobectomy Lung 04/23/2018  . Pain and swelling of wrist, right 04/18/2018  . Exertional chest pain 04/04/2018  . DOE (dyspnea on exertion) 04/04/2018  . Pulmonary nodule 03/05/2018  . Tobacco abuse counseling 03/05/2018  . Prediabetes 01/08/2018  . COPD exacerbation ([place]) 04/27/2017  . COPD (chronic obstructive pulmonary disease) ([place]) 12/07/2016  . Chest pain with moderate risk for cardiac etiology 02/01/2016  . 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 Osteopenia of femoral neck 01/12/2016  . Rosacea 12/22/2015  . Cough 12/03/2015  . Constipation, chronic 11/11/2014  . Fatigue 11/11/2014  . Basal cell carcinoma of left side of nose 06/01/2012  . Alopecia 06/01/2012  . Routine general medical examination at a health care facility 06/02/2011  . GERD (gastroesophageal reflux disease)   . Vitamin D deficiency 02/13/2008  . ADENOCARCINOMA, BREAST 02/08/2008  . Hyperlipidemia with target LDL less than 100 02/08/2008  . RHEUMATOID ARTHRITIS 02/08/2008   Discharge Diagnoses:   Patient Active Problem List   Diagnosis Date Noted  . Cellulitis of chest tube site 05/08/2018  . HCAP (healthcare-associated pneumonia) 05/08/2018  . Non-small cell lung cancer ([place]) 04/24/2018  . S/P Right Upper Lobectomy, Right Middle Lobectomy Lung 04/23/2018  . Pain and swelling of wrist, right 04/18/2018  . Exertional chest pain 04/04/2018  . DOE (dyspnea on exertion) 04/04/2018  . Pulmonary nodule 03/05/2018  . Tobacco abuse counseling 03/05/2018  . Prediabetes 01/08/2018   . COPD exacerbation ([place]) 04/27/2017  . COPD (chronic obstructive pulmonary disease) ([place]) 12/07/2016  . Chest pain with moderate risk for cardiac etiology 02/01/2016  . 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 Osteopenia of femoral neck 01/12/2016  . Rosacea 12/22/2015  . Cough 12/03/2015  . Constipation, chronic 11/11/2014  . Fatigue 11/11/2014  . Basal cell carcinoma of left side of nose 06/01/2012  . Alopecia 06/01/2012  . Routine general medical examination at a health care facility 06/02/2011  . GERD (gastroesophageal reflux disease)   . Vitamin D deficiency 02/13/2008  . ADENOCARCINOMA, BREAST 02/08/2008  . Hyperlipidemia with target LDL less than 100 02/08/2008  . RHEUMATOID ARTHRITIS 02/08/2008   Discharged Condition: good  History of Present Illness:  [name] is a 73 yo white female well known to [facility].  She is recently underwent Right VATS with right upper lobectomy, right middle lobectomy and intercostal nerve block performed on 04/23/2018.  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  Her hospital stay was complicated by prolonged air leak, atrial fibrillation, and hypertension.  She converted to NSR with use of Amiodarone and remained in NSR at discharge.  Her chest tube was able to be removed and she was discharged home in stable condition on 05/06/2018.  At the time of discharge she was noted have evidence of a chest tube site infection and she was discharged home on Levaquin for this.  She presented to our office today for removal of chest tube stitch and wound check of her chest tube site.  However today she continues to complain of pain around her chest tube site.  She also states  there is persistent redness present.  She admits to experiencing hot flashes and feeling "chilled" at times.  She did not take her temperature during these times.  She also notes that she is feeling more short of breath.  She was  evaluated by Dr. Roxan Green who obtained a CXR which was concerning for development of a right lower lobar pneumonia, despite Levaquin therapy.  It was felt she would require admission to the hospital with treatment with IV ABX.    Hospital Course:   On admission patient was found to be afebrile.  She had mild leukocytosis at 16K.  This recovered and was 13.2 at time of discharge.  She was started on IV Vancomycin for treatment of ABX.  She remained on oral Levaquin therapy as well.  Her chest tube site showed improvement of infection.  There was minimal to no erythema or purulence present.  She remained afebrile.  Due to her allergy to PCN she will continue IV Vancomycin for a total of 10 days of therapy.  PICC line has been placed and will need to be removed on 05/18/2018 which is her last day of treatment.  She is ambulating without significant difficulty.  She is medically stable for discharge home today.  Treatments: antibiotics: vancomycin  Discharge Exam: Blood pressure (!) 146/57, pulse 80, temperature 97.6 F (36.4 C), temperature source Oral, resp. rate (!) 22, height 5' 6.5" (1.689 m), weight 93.3 kg, SpO2 96 %.  General appearance: alert, cooperative and no distress Heart: regular rate and rhythm Lungs: clear to auscultation bilaterally Abdomen: soft, non-tender; bowel sounds normal; no masses,  no organomegaly Extremities: extremities normal, atraumatic, no cyanosis or edema Wound: clean and dry  Disposition: Home  Discharge Medications:  Discharge Instructions    Home infusion instructions [facility] May follow [place] Dosing Protocol; May administer Cathflo as needed to maintain patency of vascular access device.; Flushing of vascular access device: per [facility] Protocol: 0.9% NaCl pre/post medica...   Complete by:  As directed    Instructions:  May follow [place] Dosing Protocol   Instructions:  May administer Cathflo as needed to maintain patency of vascular  access device.   Instructions:  Flushing of vascular access device: per [facility] Protocol: 0.9% NaCl pre/post medication administration and prn patency; Heparin 100 u/ml, 2m for implanted ports and Heparin 10u/ml, 584mfor all other central venous catheters.   Instructions:  May follow [facility] Anaphylaxis Protocol for First Dose Administration in the home: 0.9% NaCl at 25-50 ml/hr to maintain IV access for protocol meds. Epinephrine 0.3 ml IV/IM PRN and Benadryl 25-50 IV/IM PRN s/s of anaphylaxis.   Instructions:  [facility] Coordinator (RN) to assist per patient IV care needs in the home PRN.     Allergies as of 05/11/2018      Reactions   Adhesive [tape] Rash   Paper tape causes rash   Neosporin [neomycin-bacitracin Zn-polymyx] Itching   Penicillins Rash   Did it involve swelling of the face/tongue/throat, SOB, or low BP? No Did it involve sudden or severe rash/hives, skin peeling, or any reaction on the inside of your mouth or nose? Yes Did you need to seek medical attention at a hospital or doctor's office? Yes When did it last happen? From childhood If all above answers are "NO", may proceed with cephalosporin use.      Medication List    TAKE these medications   acetaminophen 325 MG tablet Commonly known as:  TYLENOL Take 2  tablets (650 mg total) by mouth every 6 (six) hours as needed for mild pain or fever.   aspirin EC 81 MG tablet Take 1 tablet (81 mg total) by mouth daily.   atorvastatin 10 MG tablet Commonly known as:  LIPITOR Take 1 tablet (10 mg total) by mouth daily.   Co Q-10 300 MG Caps Take 300 mg by mouth daily.   Fluticasone-Salmeterol 250-50 MCG/DOSE Aepb Commonly known as:  ADVAIR Inhale 1 puff into the lungs every 12 (twelve) hours.   hydrochlorothiazide 12.5 MG capsule Commonly known as:  MICROZIDE Take 1 capsule (12.5 mg total) by mouth daily.   levofloxacin 500 MG tablet Commonly known as:  Levaquin Take 1 tablet (500 mg total) by mouth  daily for 5 days.   metoprolol tartrate 25 MG tablet Commonly known as:  LOPRESSOR Take 1 tablet (25 mg total) by mouth 2 (two) times daily.   metroNIDAZOLE 1 % gel Commonly known as:  Metrogel Apply topically daily. What changed:    how much to take  when to take this  reasons to take this   ondansetron 4 MG tablet Commonly known as:  Zofran Take 1 tablet (4 mg total) by mouth every 8 (eight) hours as needed for nausea or vomiting.   traMADol 50 MG tablet Commonly known as:  ULTRAM Take 1-2 tablets (50-100 mg total) by mouth every 6 (six) hours as needed for up to 5 days (mild pain).   vancomycin  IVPB Inject 1,750 mg into the vein daily for 7 days. Indication:  HCAP Last Day of Therapy:  05/21/2018 Labs - 'Sunday/Monday:  CBC/D, BMP, and vancomycin trough. Labs - Thursday:  BMP and vancomycin trough Labs - Every other week:  ESR and CRP   Ventolin HFA 108 (90 Base) MCG/ACT inhaler Generic drug:  albuterol TAKE 2 PUFFS BY MOUTH EVERY 6 HOURS AS NEEDED FOR WHEEZE OR SHORTNESS OF BREATH What changed:  See the new instructions.   Vitamin D3 1.25 MG (50000 UT) Caps TAKE 1 CAPSULE BY MOUTH ONE TIME PER WEEK What changed:  See the new instructions.            Home Infusion Instuctions  (From admission, onward)         Start     Ordered   05/11/18 0000  Home infusion instructions [facility] May follow [facility] Dosing Protocol; May administer Cathflo as needed to maintain patency of vascular access device.; Flushing of vascular access device: per [facility] Protocol: 0.9% NaCl pre/post medica...    Question Answer Comment  Instructions May follow [facility] Dosing Protocol   Instructions May administer Cathflo as needed to maintain patency of vascular access device.   Instructions Flushing of vascular access device: per [facility] Protocol: 0.9% NaCl pre/post medication administration and prn patency; Heparin 100 u/ml, 5ml for implanted ports and Heparin 10u/ml, 5ml for  all other central venous catheters.   Instructions May follow [facility] Anaphylaxis Protocol for First Dose Administration in the home: 0.9% NaCl at 25-50 ml/hr to maintain IV access for protocol meds. Epinephrine 0.3 ml IV/IM PRN and Benadryl 25-50 IV/IM PRN s/s of anaphylaxis.   Instructions [facility] Infusion Coordinator (RN) to assist per patient IV care needs in the home PRN.      03' /13/20 1610         Follow-up Information    home health Follow up.   Why:  Please remove PICC line after last dose of antibiotics on 05/18/2018  Signed: Ellwood Handler PA-C 05/11/2018, 8:25 AM

## 2018-05-09 NOTE — Progress Notes (Addendum)
      [address]       [place] [address]             [phone]    Subjective:  Patient states she is doing okay.  The patient says getting a "picc" will kill her.  Asked if she really has to have it.  Objective: Vital signs in last 24 hours: Temp:  [97.9 F (36.6 C)-98.6 F (37 C)] 98.2 F (36.8 C) (03/11 0744) Pulse Rate:  [76-105] 90 (03/11 0744) Cardiac Rhythm: Normal sinus rhythm (03/11 0700) Resp:  [18-23] 20 (03/11 0744) BP: (124-149)/(54-67) 149/56 (03/11 0744) SpO2:  [94 %-97 %] 94 % (03/11 0400) Weight:  [93.3 kg-97.5 kg] 93.3 kg (03/10 1513)  Intake/Output this shift: Total I/O In: 240 [P.O.:240] Out: -   General appearance: alert, cooperative and no distress Heart: regular rate and rhythm Lungs: diminshed right base Abdomen: soft, non-tender; bowel sounds normal; no masses,  no organomegaly Extremities: extremities normal, atraumatic, no cyanosis or edema Wound: clean, minimal erythmea at chest tube site  Lab Results: Recent Labs    05/08/18 1610 05/09/18 0305  WBC 16.7* 14.4*  HGB 11.6* 11.9*  HCT 35.8* 37.0  PLT 531* 560*   BMET:  Recent Labs    05/08/18 1610 05/09/18 0305  NA 135 136  K 3.7 4.0  CL 98 100  CO2 26 28  GLUCOSE 132* 122*  BUN 19 19  CREATININE 0.78 0.77  CALCIUM 8.7* 9.1    PT/INR: No results for input(s): LABPROT, INR in the last 72 hours. ABG    Component Value Date/Time   PHART 7.354 04/24/2018 0555   HCO3 26.3 04/24/2018 0555   TCO2 31 04/10/2018 1118   ACIDBASEDEF TEST WILL BE CREDITED 04/19/2018 0930   O2SAT 98.6 04/24/2018 0555   CBG (last 3)  No results for input(s): GLUCAP in the last 72 hours.  Assessment/Plan:  1. S/P VATs for RML/RLL d/c home on 3/8- presented to office with worsening shortness of breath 2. + HCAP pneumonia- not responding to oral Levaquin as outpatient, on IV Vancomycin currently... remains afebrile, mild leukocytosis which is improving, denies purulent cough...  PICC line order has been placed 3. CV- NSR, BP in the 140s- continue Lopressor 4. Right chest tube site cellulitis- on levaquin, minimal erythema 5. Lovenox for DVT 6. Dispo- patient stable,   LOS: 1 day   Caryn Bee [phone] 05/09/2018 Chest tube site looks significantly better already Will see how she does with ambulation She is reluctant to have a PICC due to prior bad experience. Will manage with peripheral IVs  Revonda Standard. Roxan Hockey, MD [facility] [phone]

## 2018-05-09 NOTE — Plan of Care (Signed)

## 2018-05-10 ENCOUNTER — Inpatient Hospital Stay (HOSPITAL_COMMUNITY): Payer: Medicare HMO

## 2018-05-10 ENCOUNTER — Encounter (HOSPITAL_COMMUNITY): Payer: Self-pay

## 2018-05-10 ENCOUNTER — Other Ambulatory Visit: Payer: Self-pay

## 2018-05-10 LAB — CBC
HCT: 33.8 % — ABNORMAL LOW (ref 36.0–46.0)
HEMOGLOBIN: 11.1 g/dL — AB (ref 12.0–15.0)
MCH: 30.5 pg (ref 26.0–34.0)
MCHC: 32.8 g/dL (ref 30.0–36.0)
MCV: 92.9 fL (ref 80.0–100.0)
Platelets: 548 10*3/uL — ABNORMAL HIGH (ref 150–400)
RBC: 3.64 MIL/uL — ABNORMAL LOW (ref 3.87–5.11)
RDW: 14 % (ref 11.5–15.5)
WBC: 13.2 10*3/uL — ABNORMAL HIGH (ref 4.0–10.5)
nRBC: 0 % (ref 0.0–0.2)

## 2018-05-10 MED ORDER — SODIUM CHLORIDE 0.9% FLUSH: 10.0000 mL | Freq: Two times a day (BID) | INTRAVENOUS | Status: DC

## 2018-05-10 MED ORDER — SODIUM CHLORIDE 0.9% FLUSH
10.0000 mL | INTRAVENOUS | Status: DC | PRN
  Administered 2018-05-11: 10 mL
  Filled 2018-05-10: qty 40

## 2018-05-10 NOTE — Progress Notes (Signed)
      Hannah [name]       [name] [address]             [phone]      Subjective:  Patient with episode of shortness of breath overnight.  She states she just couldn't get her breath and it made her panic.  Objective: Vital signs in last 24 hours: Temp:  [97.7 F (36.5 C)-98.5 F (36.9 C)] 98.5 F (36.9 C) (03/12 0347) Pulse Rate:  [78-87] 86 (03/12 0347) Cardiac Rhythm: Normal sinus rhythm (03/12 0347) Resp:  [19-24] 24 (03/12 0347) BP: (117-148)/(41-60) 117/41 (03/12 0347) SpO2:  [94 %-98 %] 94 % (03/12 0805)  Intake/Output from previous day: 03/11 0701 - 03/12 0700 In: 1119.9 [P.O.:480; IV Piggyback:639.9] Out: -   General appearance: alert, cooperative and no distress Heart: regular rate and rhythm Lungs: diminished breath sounds right base Abdomen: soft, non-tender; bowel sounds normal; no masses,  no organomegaly Extremities: extremities normal, atraumatic, no cyanosis or edema Wound: + erythema chest tube site, incision healing well  Lab Results: Recent Labs    05/09/18 0305 05/10/18 0347  WBC 14.4* 13.2*  HGB 11.9* 11.1*  HCT 37.0 33.8*  PLT 560* 548*   BMET:  Recent Labs    05/08/18 1610 05/09/18 0305  NA 135 136  K 3.7 4.0  CL 98 100  CO2 26 28  GLUCOSE 132* 122*  BUN 19 19  CREATININE 0.78 0.77  CALCIUM 8.7* 9.1    PT/INR: No results for input(s): LABPROT, INR in the last 72 hours. ABG    Component Value Date/Time   PHART 7.354 04/24/2018 0555   HCO3 26.3 04/24/2018 0555   TCO2 31 04/10/2018 1118   ACIDBASEDEF TEST WILL BE CREDITED 04/19/2018 0930   O2SAT 98.6 04/24/2018 0555   CBG (last 3)  No results for input(s): GLUCAP in the last 72 hours.  Assessment/Plan:  1. HCAP Pneumonia- on Vancomycin,  Remains afebrile, leukocytosis improving 2. CV- NSR on Lopressor 3. Right Chest tube cellulitis-improving continue Levaquin 4. Lovenox for DVT prophylaxis 5. Dispo- patient stable, IV blew last night had to replace  peripheral, continue Vancomycin, may require PICC line if cant' maintain IV access, continue current care, get 2V CXR in AM   LOS: 2 days    Ellwood Handler 05/10/2018

## 2018-05-10 NOTE — Progress Notes (Signed)
Peripherally Inserted Central Catheter/Midline Placement  The IV Nurse has discussed with the patient and/or persons authorized to consent for the patient, the purpose of this procedure and the potential benefits and risks involved with this procedure.  The benefits include less needle sticks, lab draws from the catheter, and the patient may be discharged home with the catheter. Risks include, but not limited to, infection, bleeding, blood clot (thrombus formation), and puncture of an artery; nerve damage and irregular heartbeat and possibility to perform a PICC exchange if needed/ordered by physician.  Alternatives to this procedure were also discussed.  [facility] PICC patient education guide, fact sheet on infection prevention and patient information card has been provided to patient /or left at bedside.    PICC/Midline Placement Documentation  PICC Single Lumen 05/10/18 PICC Right 45 cm 1 cm (Active)  Indication for Insertion or Continuance of Line Prolonged intravenous therapies 05/10/2018 10:00 AM  Exposed Catheter (cm) 1 cm 05/10/2018 10:00 AM  Site Assessment Clean;Dry;Intact 05/10/2018 10:00 AM  Line Status Flushed;Blood return noted 05/10/2018 10:00 AM  Dressing Type Transparent 05/10/2018 10:00 AM  Dressing Status Clean;Dry;Intact;Antimicrobial disc in place 05/10/2018 10:00 AM  Dressing Change Due 05/17/18 05/10/2018 10:00 AM       Jule Economy Horton 05/10/2018, 10:39 AM

## 2018-05-11 LAB — BASIC METABOLIC PANEL
Anion gap: 5 (ref 5–15)
BUN: 11 mg/dL (ref 8–23)
CO2: 27 mmol/L (ref 22–32)
CREATININE: 0.68 mg/dL (ref 0.44–1.00)
Calcium: 8.4 mg/dL — ABNORMAL LOW (ref 8.9–10.3)
Chloride: 108 mmol/L (ref 98–111)
GFR calc non Af Amer: >60 mL/min (ref >60)
Glucose, Bld: 121 mg/dL — ABNORMAL HIGH (ref 70–99)
Potassium: 3.8 mmol/L (ref 3.5–5.1)
Sodium: 140 mmol/L (ref 135–145)

## 2018-05-11 MED ORDER — VANCOMYCIN IV (FOR PTA / DISCHARGE USE ONLY): 1750.0000 mg | INTRAVENOUS | 0 refills | Status: DC

## 2018-05-11 MED ORDER — HEPARIN SOD (PORK) LOCK FLUSH 100 UNIT/ML IV SOLN
250.0000 [IU] | INTRAVENOUS | Status: AC | PRN
  Administered 2018-05-11: 250 [IU]

## 2018-05-11 MED ORDER — VANCOMYCIN IV (FOR PTA / DISCHARGE USE ONLY): 1750.0000 mg | INTRAVENOUS | 0 refills | Status: AC

## 2018-05-11 MED ORDER — ACETAMINOPHEN 325 MG PO TABS: 650.0000 mg | ORAL_TABLET | Freq: Four times a day (QID) | ORAL | Status: DC | PRN

## 2018-05-11 NOTE — Care Management Important Message (Signed)
Important Message  Patient Details  Name: Hannah Green MRN: [id number] Date of Birth: 11/23/1945   Medicare Important Message Given:  Yes    Valerio Pinard P Anaika Santillano 05/11/2018, 2:53 PM

## 2018-05-11 NOTE — Discharge Instructions (Signed)
Discharge Instructions:  1. May sponge bath, do not shower or tub bath until PICC line is removed 2. Activity- up as tolerated, avoid strenuous lifting, activity 3. Wound care- please wash chest tube site daily and keep clean and dry 4. No driving until cleared by Dr. Leonarda Salon office 5. Please contact office at [phone]

## 2018-05-11 NOTE — Progress Notes (Addendum)
      [address]       ,[place] [address]             [phone]      Subjective:  No new complaints.  Wants to go home.  She does not have anyone to help her administer antibiotics at discharge.  Objective: Vital signs in last 24 hours: Temp:  [97.6 F (36.4 C)-98.3 F (36.8 C)] 97.6 F (36.4 C) (03/13 0756) Pulse Rate:  [80-87] 80 (03/12 2300) Cardiac Rhythm: Normal sinus rhythm (03/13 0742) Resp:  [18-25] 22 (03/13 0756) BP: (131-157)/(46-74) 146/57 (03/13 0756) SpO2:  [96 %-98 %] 96 % (03/12 2300)   Intake/Output from previous day: 03/12 0701 - 03/13 0700 In: 1309.8 [P.O.:360; IV Piggyback:949.8] Out: -   General appearance: alert, cooperative and no distress Heart: regular rate and rhythm Lungs: clear to auscultation bilaterally Abdomen: soft, non-tender; bowel sounds normal; no masses,  no organomegaly Extremities: extremities normal, atraumatic, no cyanosis or edema Wound: clean and dry  Lab Results: Recent Labs    05/09/18 0305 05/10/18 0347  WBC 14.4* 13.2*  HGB 11.9* 11.1*  HCT 37.0 33.8*  PLT 560* 548*   BMET:  Recent Labs    05/09/18 0305 05/11/18 0351  NA 136 140  K 4.0 3.8  CL 100 108  CO2 28 27  GLUCOSE 122* 121*  BUN 19 11  CREATININE 0.77 0.68  CALCIUM 9.1 8.4*    PT/INR: No results for input(s): LABPROT, INR in the last 72 hours. ABG    Component Value Date/Time   PHART 7.354 04/24/2018 0555   HCO3 26.3 04/24/2018 0555   TCO2 31 04/10/2018 1118   ACIDBASEDEF TEST WILL BE CREDITED 04/19/2018 0930   O2SAT 98.6 04/24/2018 0555   CBG (last 3)  No results for input(s): GLUCAP in the last 72 hours.  Assessment/Plan:  1. HCAP- on Vancomycin, PICC line in place- will continue IV Vancomycin with last dose on 05/21/2018, home health arrangements have been placed 2. CV- NSR, remains hypertensive at times, continue lopressor, restart HCTZ at discharge 3. Right chest tube cellulitis- improving, complete Levaquin,  continue good wound care 4. Lovenox for DVT prophylaxis 5. Dispo- patient stable, continue IV Vancomycin until 3/23, PICC line has been placed, home health arrangements have been ordered, will d/c home today if home health arrangements can be finalized   LOS: 3 days    Ellwood Handler 05/11/2018 Patient seen and examined, agree with above Needs 10 days total of vanco- day 3 today  Remo Lipps C. Roxan Hockey, MD [facility] [phone]

## 2018-05-11 NOTE — Progress Notes (Signed)
Discharged home by wheelchair accompanied by son. Discharge instructions and prescriptions given to pt.

## 2018-05-11 NOTE — Progress Notes (Signed)
PHARMACY CONSULT NOTE FOR:  OUTPATIENT  PARENTERAL ANTIBIOTIC THERAPY (OPAT)  Indication: Pneumonia Regimen: Vancomycin 1750mg  q24 hours End date: 05/18/18  IV antibiotic discharge orders are signed.    Thank you for allowing pharmacy to be a part of this patient's care.  Erin Hearing PharmD., BCPS Clinical Pharmacist 05/11/2018 10:24 AM

## 2018-05-11 NOTE — Progress Notes (Signed)
All set for discharge home once vancomycin IV completed, Iv team to come to flush picc line. Pt is so impatient  to wait  for iv to be completed.

## 2018-05-11 NOTE — TOC Transition Note (Addendum)
Transition of Care [facility]) - CM/SW Discharge Note   Patient Details  Name: Hannah Green MRN: [id number] Date of Birth: 09-25-1945  [facility] CM/SW Contact:  Maryclare Labrador, RN Phone Number: 05/11/2018, 2:28 PM   Clinical Narrative:       Final next level of care: [place] Barriers to Discharge: No Barriers Identified   Patient Goals and CMS Choice Patient states their goals for this hospitalization and ongoing recovery are:: (She is anxious to return home ) CMS Medicare.gov Compare Post Acute Care list provided to:: Patient Choice offered to / list presented to : Patient  Discharge Placement  Pt is a readmit with cellulitis of the chest tube site (previous VATs pt).  Pt is independent from home.  Pt has PCP and denied barriers with paying for meds.  Pt will discharge home on IV antibitoics, pt given DME and HH medicare,gov list - pt chose [facility] for HH and [facility] for DMR.  Both agencies accepted referral and informed that pt will discharge home today.                  Discharge Plan and Services Discharge Planning Services: CM Consult            DME Arranged: IV pump/equipment DME Agency: [facility] HH Arranged: RN [place] Agency: [place] ([facility])   Social Determinants of Health (SDOH) Interventions     Readmission Risk Interventions Readmission Risk Prevention Plan 05/11/2018  Transportation Screening Complete  PCP or Specialist Appt within 5-7 Days Complete  Home Care Screening Complete  Some recent data might be hidden

## 2018-05-12 DIAGNOSIS — C3491 Malignant neoplasm of unspecified part of right bronchus or lung: Secondary | ICD-10-CM | POA: Diagnosis not present

## 2018-05-12 DIAGNOSIS — Z452 Encounter for adjustment and management of vascular access device: Secondary | ICD-10-CM | POA: Diagnosis not present

## 2018-05-12 DIAGNOSIS — J440 Chronic obstructive pulmonary disease with acute lower respiratory infection: Secondary | ICD-10-CM | POA: Diagnosis not present

## 2018-05-12 DIAGNOSIS — J189 Pneumonia, unspecified organism: Secondary | ICD-10-CM | POA: Diagnosis not present

## 2018-05-12 DIAGNOSIS — I4891 Unspecified atrial fibrillation: Secondary | ICD-10-CM | POA: Diagnosis not present

## 2018-05-12 DIAGNOSIS — J441 Chronic obstructive pulmonary disease with (acute) exacerbation: Secondary | ICD-10-CM | POA: Diagnosis not present

## 2018-05-12 DIAGNOSIS — I119 Hypertensive heart disease without heart failure: Secondary | ICD-10-CM | POA: Diagnosis not present

## 2018-05-12 DIAGNOSIS — C44319 Basal cell carcinoma of skin of other parts of face: Secondary | ICD-10-CM | POA: Diagnosis not present

## 2018-05-12 DIAGNOSIS — C50912 Malignant neoplasm of unspecified site of left female breast: Secondary | ICD-10-CM | POA: Diagnosis not present

## 2018-05-12 DIAGNOSIS — J181 Lobar pneumonia, unspecified organism: Secondary | ICD-10-CM | POA: Diagnosis not present

## 2018-05-12 DIAGNOSIS — T8149XA Infection following a procedure, other surgical site, initial encounter: Secondary | ICD-10-CM | POA: Diagnosis not present

## 2018-05-13 DIAGNOSIS — J189 Pneumonia, unspecified organism: Secondary | ICD-10-CM | POA: Diagnosis not present

## 2018-05-14 ENCOUNTER — Other Ambulatory Visit: Payer: Self-pay | Admitting: *Deleted

## 2018-05-14 ENCOUNTER — Other Ambulatory Visit: Payer: Self-pay

## 2018-05-14 DIAGNOSIS — C50912 Malignant neoplasm of unspecified site of left female breast: Secondary | ICD-10-CM | POA: Diagnosis not present

## 2018-05-14 DIAGNOSIS — Z452 Encounter for adjustment and management of vascular access device: Secondary | ICD-10-CM | POA: Diagnosis not present

## 2018-05-14 DIAGNOSIS — I119 Hypertensive heart disease without heart failure: Secondary | ICD-10-CM | POA: Diagnosis not present

## 2018-05-14 DIAGNOSIS — C3491 Malignant neoplasm of unspecified part of right bronchus or lung: Secondary | ICD-10-CM | POA: Diagnosis not present

## 2018-05-14 DIAGNOSIS — I4891 Unspecified atrial fibrillation: Secondary | ICD-10-CM | POA: Diagnosis not present

## 2018-05-14 DIAGNOSIS — J189 Pneumonia, unspecified organism: Secondary | ICD-10-CM | POA: Diagnosis not present

## 2018-05-14 DIAGNOSIS — J440 Chronic obstructive pulmonary disease with acute lower respiratory infection: Secondary | ICD-10-CM | POA: Diagnosis not present

## 2018-05-14 DIAGNOSIS — C44319 Basal cell carcinoma of skin of other parts of face: Secondary | ICD-10-CM | POA: Diagnosis not present

## 2018-05-14 DIAGNOSIS — T8149XA Infection following a procedure, other surgical site, initial encounter: Secondary | ICD-10-CM | POA: Diagnosis not present

## 2018-05-14 DIAGNOSIS — J181 Lobar pneumonia, unspecified organism: Secondary | ICD-10-CM | POA: Diagnosis not present

## 2018-05-14 DIAGNOSIS — R911 Solitary pulmonary nodule: Secondary | ICD-10-CM

## 2018-05-14 DIAGNOSIS — L0390 Cellulitis, unspecified: Secondary | ICD-10-CM | POA: Diagnosis not present

## 2018-05-14 DIAGNOSIS — J441 Chronic obstructive pulmonary disease with (acute) exacerbation: Secondary | ICD-10-CM | POA: Diagnosis not present

## 2018-05-14 NOTE — Progress Notes (Unsigned)
dg 

## 2018-05-15 ENCOUNTER — Ambulatory Visit (INDEPENDENT_AMBULATORY_CARE_PROVIDER_SITE_OTHER): Payer: Self-pay | Admitting: Thoracic Surgery (Cardiothoracic Vascular Surgery)

## 2018-05-15 ENCOUNTER — Ambulatory Visit
Admission: RE | Admit: 2018-05-15 | Discharge: 2018-05-15 | Disposition: A | Discharge status: Home / Self Care | Payer: Medicare HMO | Source: Ambulatory Visit | Attending: Thoracic Surgery (Cardiothoracic Vascular Surgery) | Admitting: Thoracic Surgery (Cardiothoracic Vascular Surgery)

## 2018-05-15 VITALS — BP 150/80 | HR 84 | Temp 98.5°F | Resp 20 | Wt 219.0 lb

## 2018-05-15 DIAGNOSIS — Z5332 Thoracoscopic surgical procedure converted to open procedure: Secondary | ICD-10-CM | POA: Diagnosis not present

## 2018-05-15 DIAGNOSIS — C3491 Malignant neoplasm of unspecified part of right bronchus or lung: Secondary | ICD-10-CM

## 2018-05-15 DIAGNOSIS — J189 Pneumonia, unspecified organism: Secondary | ICD-10-CM | POA: Diagnosis not present

## 2018-05-15 DIAGNOSIS — R911 Solitary pulmonary nodule: Secondary | ICD-10-CM

## 2018-05-15 NOTE — Progress Notes (Signed)
[address]       ,[place] [address]             [phone]       HPI: Hannah Green returns for a scheduled follow-up visit  Hannah Green is a 73 year old woman with a history of tobacco abuse, COPD, remote breast cancer (1999), rheumatoid arthritis, reflux, vitamin D deficiency, and diastolic dysfunction.  She was found to have a 5 mm lung nodule back in December 2018.  This was found through the lung cancer screening program.  Recently she had a follow-up scan which showed an increased in size to 9 to 10 mm.  On PET CT there was marked hypermetabolic activity with an SUV of 7.  There is no evidence of metastatic disease.  I did a right upper and middle lobectomy on 04/23/2018.  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  It was only 1.7 cm but did involve the subpleural connective tissue.  Postoperatively she had a prolonged air leak.  That ultimately resolved and her tube was removed and she went home on 05/06/2018.  She did have some cellulitis around her chest tube site and was sent home on Levaquin.  She presented back on 05/08/2018 with shortness of breath.  She had a chest x-ray which showed a possible pneumonia, and the cellulitis around the chest tube site was worse.  She was admitted and treated with IV vancomycin.  She went home on day 3 with the plan to complete a 10-day course of IV antibiotics.  She says that today is her best day since the surgery.  She feels much better.  Her breathing is improved.  She is not taking any narcotics for pain.  Past Medical History:  Diagnosis Date  . Breast cancer ([place]) 1999  . Cataract 2017   resolved with surgery  . Coronary artery disease   . GERD (gastroesophageal reflux disease)   . Hyperlipidemia   . Rheumatoid arthritis(714.0)      Current Outpatient Medications  Medication Sig Dispense Refill  . acetaminophen (TYLENOL) 325 MG tablet Take 2 tablets (650 mg total) by mouth every 6 (six)  hours as needed for mild pain or fever.    Marland Kitchen aspirin EC 81 MG tablet Take 1 tablet (81 mg total) by mouth daily. 90 tablet 3  . atorvastatin (LIPITOR) 10 MG tablet Take 1 tablet (10 mg total) by mouth daily. 90 tablet 3  . Cholecalciferol (VITAMIN D3) 1.25 MG (50000 UT) CAPS TAKE 1 CAPSULE BY MOUTH ONE TIME PER WEEK (Patient taking differently: Take 50,000 Units by mouth every Friday. ) 12 capsule 0  . Coenzyme Q10 (CO Q-10) 300 MG CAPS Take 300 mg by mouth daily.    . Fluticasone-Salmeterol (ADVAIR) 250-50 MCG/DOSE AEPB Inhale 1 puff into the lungs every 12 (twelve) hours. 60 each 11  . hydrochlorothiazide (MICROZIDE) 12.5 MG capsule Take 1 capsule (12.5 mg total) by mouth daily. 30 capsule 1  . metoprolol tartrate (LOPRESSOR) 25 MG tablet Take 1 tablet (25 mg total) by mouth 2 (two) times daily. 60 tablet 2  . metroNIDAZOLE (METROGEL) 1 % gel Apply topically daily. (Patient taking differently: Apply 1 application topically daily as needed (rosacea). ) 30 g 1  . ondansetron (ZOFRAN) 4 MG tablet Take 1 tablet (4 mg total) by mouth every 8 (eight) hours as needed for nausea or vomiting. 10 tablet 1  . vancomycin IVPB Inject 1,750 mg into  the vein daily for 7 days. Indication:  HCAP Last Day of Therapy:  05/21/2018 Labs - Sunday/Monday:  CBC/D, BMP, and vancomycin trough. Labs - Thursday:  BMP and vancomycin trough Labs - Every other week:  ESR and CRP 7 Units 0  . VENTOLIN HFA 108 (90 Base) MCG/ACT inhaler TAKE 2 PUFFS BY MOUTH EVERY 6 HOURS AS NEEDED FOR WHEEZE OR SHORTNESS OF BREATH (Patient taking differently: Inhale 2 puffs into the lungs every 6 (six) hours as needed for shortness of breath. ) 18 Inhaler 3   No current facility-administered medications for this visit.     Physical Exam BP (!) 150/80   Pulse 84   Temp 98.5 F (36.9 C) (Oral)   Resp 20   Wt 219 lb (99.3 kg)   LMP  (LMP Unknown)   SpO2 92%   BMI 34.17 kg/m  73 year old woman in no acute distress Alert and oriented x3  with no focal deficits Lungs diminished at right base, otherwise clear Incision healing well, chest tube site with minimal induration but no erythema Cardiac regular rate and rhythm  Diagnostic Tests: CHEST - 2 VIEW  COMPARISON:  05/10/2018.  04/28/2018.  FINDINGS: Right PICC line noted with tip over superior vena cava. Mediastinum and hilar structures normal. Heart size normal. Right base atelectasis/infiltrate. Surgical clips noted over the right chest.  IMPRESSION: Right PICC line noted with tip over superior vena cava.  2. Right base atelectasis/infiltrate. No interim change from prior exam.   Electronically Signed   By: Marcello Moores  Register   On: 05/15/2018 15:54 I personally reviewed the chest x-ray images and concur with the findings noted above.  I do think the infiltrate has improved slightly from her previous study  Impression: Hannah Green is a 73 year old woman with a history of tobacco abuse, COPD, obesity, rheumatoid arthritis, and remote breast cancer.  She was found to have a lung nodule on a lung cancer screening CT in December 2018.  A year later it had essentially doubled in size.  I did a thoracoscopic right upper and middle bilobectomy on 04/23/2018.  Her postoperative course was complicated by prolonged air leak, cellulitis of her chest tube site, and readmission with pneumonia.  Despite that she looks great today.  She is having minimal pain and is not taking any narcotics.  Her respiratory status is improving.  Her cellulitis at the chest tube site is resolving.  She is scheduled to finish her antibiotics after a 10-day course.  That will be after her dose on 05/18/2018.  We will have the home health nurse remove her PICC line once that is finished.  Plan: Complete 10-day course of vancomycin and then DC PICC line Referral to [facility] to see Dr. Julien Nordmann I will see her back in 3 weeks with a PA and lateral chest x-ray to check on her progress.  Melrose Nakayama, MD [facility] [phone]

## 2018-05-16 ENCOUNTER — Ambulatory Visit: Payer: Medicare HMO | Admitting: Pulmonary Disease

## 2018-05-16 DIAGNOSIS — J189 Pneumonia, unspecified organism: Secondary | ICD-10-CM | POA: Diagnosis not present

## 2018-05-17 ENCOUNTER — Encounter: Payer: Self-pay | Admitting: Internal Medicine

## 2018-05-17 ENCOUNTER — Telehealth: Payer: Self-pay | Admitting: Internal Medicine

## 2018-05-17 DIAGNOSIS — J440 Chronic obstructive pulmonary disease with acute lower respiratory infection: Secondary | ICD-10-CM | POA: Diagnosis not present

## 2018-05-17 DIAGNOSIS — I4891 Unspecified atrial fibrillation: Secondary | ICD-10-CM | POA: Diagnosis not present

## 2018-05-17 DIAGNOSIS — C3491 Malignant neoplasm of unspecified part of right bronchus or lung: Secondary | ICD-10-CM | POA: Diagnosis not present

## 2018-05-17 DIAGNOSIS — C50912 Malignant neoplasm of unspecified site of left female breast: Secondary | ICD-10-CM | POA: Diagnosis not present

## 2018-05-17 DIAGNOSIS — J181 Lobar pneumonia, unspecified organism: Secondary | ICD-10-CM | POA: Diagnosis not present

## 2018-05-17 DIAGNOSIS — I119 Hypertensive heart disease without heart failure: Secondary | ICD-10-CM | POA: Diagnosis not present

## 2018-05-17 DIAGNOSIS — J441 Chronic obstructive pulmonary disease with (acute) exacerbation: Secondary | ICD-10-CM | POA: Diagnosis not present

## 2018-05-17 DIAGNOSIS — Z452 Encounter for adjustment and management of vascular access device: Secondary | ICD-10-CM | POA: Diagnosis not present

## 2018-05-17 DIAGNOSIS — C44319 Basal cell carcinoma of skin of other parts of face: Secondary | ICD-10-CM | POA: Diagnosis not present

## 2018-05-17 DIAGNOSIS — T8149XA Infection following a procedure, other surgical site, initial encounter: Secondary | ICD-10-CM | POA: Diagnosis not present

## 2018-05-17 NOTE — Telephone Encounter (Signed)
A new patient appt has been scheduled for the pt to see Dr. Julien Nordmann on 4/14 at 2:15pm with labs at 1:45pm. Pt has been cld and made aware of the appt date and time. Letter mailed.

## 2018-05-19 ENCOUNTER — Other Ambulatory Visit: Payer: Self-pay | Admitting: Family Medicine

## 2018-05-19 DIAGNOSIS — J440 Chronic obstructive pulmonary disease with acute lower respiratory infection: Secondary | ICD-10-CM | POA: Diagnosis not present

## 2018-05-19 DIAGNOSIS — J181 Lobar pneumonia, unspecified organism: Secondary | ICD-10-CM | POA: Diagnosis not present

## 2018-05-19 DIAGNOSIS — Z452 Encounter for adjustment and management of vascular access device: Secondary | ICD-10-CM | POA: Diagnosis not present

## 2018-05-19 DIAGNOSIS — I4891 Unspecified atrial fibrillation: Secondary | ICD-10-CM | POA: Diagnosis not present

## 2018-05-19 DIAGNOSIS — C50912 Malignant neoplasm of unspecified site of left female breast: Secondary | ICD-10-CM | POA: Diagnosis not present

## 2018-05-19 DIAGNOSIS — T8149XA Infection following a procedure, other surgical site, initial encounter: Secondary | ICD-10-CM | POA: Diagnosis not present

## 2018-05-19 DIAGNOSIS — C44319 Basal cell carcinoma of skin of other parts of face: Secondary | ICD-10-CM | POA: Diagnosis not present

## 2018-05-19 DIAGNOSIS — C3491 Malignant neoplasm of unspecified part of right bronchus or lung: Secondary | ICD-10-CM | POA: Diagnosis not present

## 2018-05-19 DIAGNOSIS — J441 Chronic obstructive pulmonary disease with (acute) exacerbation: Secondary | ICD-10-CM | POA: Diagnosis not present

## 2018-05-19 DIAGNOSIS — I119 Hypertensive heart disease without heart failure: Secondary | ICD-10-CM | POA: Diagnosis not present

## 2018-05-21 NOTE — Telephone Encounter (Signed)
Last office visit 02/14/2018 with Dr. Lorelei Pont for community acquired pneumonia.  Last refilled 03/01/2018 for #12 with no refills.  Last Vit D level 12/05/2017 low at 18.44 ng/ml.  Next Appt: 12/25/2018 for CPE.

## 2018-05-30 ENCOUNTER — Other Ambulatory Visit: Payer: Self-pay

## 2018-05-30 ENCOUNTER — Encounter: Payer: Self-pay | Admitting: Primary Care

## 2018-05-30 ENCOUNTER — Ambulatory Visit: Payer: Medicare HMO | Admitting: Pulmonary Disease

## 2018-05-30 ENCOUNTER — Ambulatory Visit (INDEPENDENT_AMBULATORY_CARE_PROVIDER_SITE_OTHER): Payer: Medicare HMO | Admitting: Primary Care

## 2018-05-30 DIAGNOSIS — J449 Chronic obstructive pulmonary disease, unspecified: Secondary | ICD-10-CM | POA: Diagnosis not present

## 2018-05-30 NOTE — Patient Instructions (Addendum)
  Thank you for allowing me to speak with you today for your follow-up appointment regarding COPD and squamous cell carcinoma  COPD: Continue Advair twice daily as prescribed (you have a $250 deductible, once this is met it will cost $47 month)  Squamous cell carcinoma of the lung: Following with Dr. Roxan Hockey   Follow up: Dr. Valeta Harms in 3-4 months with pulmonary function test

## 2018-05-30 NOTE — Progress Notes (Signed)
Virtual Visit via Telephone Note  I connected with Hannah Green on 05/30/18 at  2:45 PM EDT by telephone and verified that I am speaking with the correct person using two identifiers.   I discussed the limitations, risks, security and privacy concerns of performing an evaluation and management service by telephone and the availability of in person appointments. I also discussed with the patient that there may be a patient responsible charge related to this service. The patient expressed understanding and agreed to proceed.  Patient at home, agreeing to [facility]. Myself and Etta Grandchild present on phone call. My nurse lisa to set up follow-up and mail AVS.  History of Present Illness: 73 year old female, former smoker quit 2013 (75 pack year hx). PMH significant for mild COPD, breast cancer, RA, reflux, diastolic dysfunction. Patient of Dr. Valeta Harms, last seen on 03/15/18 for abnormal PET scan.  She was found to have 79mm lung nodule in December 2018 through the lung cancer screening program. Follow-up scan in Jan 2002 showed increase size RUL pulmonary nodule measuring 58mm in size. PET scan showed hypermetabolic activity with an SUV of 7. No evidence of metastatic disease. Patient referred to Dr. Roxan Hockey for possible surgical removal. Patient underwent right upper and middle lobectomy on 04/23/18. Dx stage 1b (T2, N0) squamous cell carcinoma. Discharged home on 05/06/2018, treated for cellulitis around CT site with oral levaquin. Re-admitted on 3/10 for sob, possible pneumonia and worsening cellulitis. Tx IV vancomycin, discharged on day 3 with PICC line and received additional 7 dose at home. She saw Dr. Roxan Hockey on 3/17 for follow-up visit and she was feeling much better. Referred to [facility] to see Dr. Julien Nordmann. Plan see Dr. Roxan Hockey ofifice again in 3 weeks with repeat PA/lat CXR.  05/30/2018 Patient called today for follow-up from Dr. Valeta Harms last visit in January. She states that things are going ok.  Breathing is 150% better than when she had pneumonia. Gets winded with walking. Using Advair twice a day as prescribed. She has not needed to use her rescue inhaler. Reports a knot on the right side of her rib cage, states that it is not where incision was. First noticed 4 days ago. No drainage or fever.  Receiving home health services.   States that she got a letter stating that her Advair would no longer be covered. My nurse called her pharmacy and insurance company. She has a 250 dollar deductible that she needs to meet first and then prescription will cost her $47/month.   Observations/Objective:  - No shortness of breath, wheezing or cough observed during phone call   Assessment and Plan:  COPD: - Continue Advair twice daily as prescribed  - Ventolin hfa 2 puffs every 6 hours as needed for sob/wheezing  - Follow up in 3-4 months with PFTs  Squamous cell carcinoma of the right lung : - S/p right upper and middle lobectomy on 04/23/18 - Following with Dr. Roxan Hockey, has an apt next week with PA  Follow Up Instructions:   - 3-4 months for Dr. Valeta Harms  I discussed the assessment and treatment plan with the patient. The patient was provided an opportunity to ask questions and all were answered. The patient agreed with the plan and demonstrated an understanding of the instructions.   The patient was advised to call back or seek an in-person evaluation if the symptoms worsen or if the condition fails to improve as anticipated.  I provided 25 minutes of non-face-to-face time during this encounter.  Martyn Ehrich, NP

## 2018-06-01 ENCOUNTER — Ambulatory Visit: Payer: Medicare HMO | Admitting: Primary Care

## 2018-06-03 NOTE — Progress Notes (Signed)
PCCM: Agreed.thanks for calling.  Garner Nash, DO [facility] 06/03/2018 3:48 PM

## 2018-06-04 ENCOUNTER — Other Ambulatory Visit: Payer: Self-pay

## 2018-06-05 ENCOUNTER — Ambulatory Visit
Admission: RE | Admit: 2018-06-05 | Discharge: 2018-06-05 | Disposition: A | Discharge status: Home / Self Care | Payer: Medicare HMO | Source: Ambulatory Visit | Attending: Thoracic Surgery (Cardiothoracic Vascular Surgery) | Admitting: Thoracic Surgery (Cardiothoracic Vascular Surgery)

## 2018-06-05 ENCOUNTER — Other Ambulatory Visit: Payer: Self-pay | Admitting: Thoracic Surgery (Cardiothoracic Vascular Surgery)

## 2018-06-05 ENCOUNTER — Encounter: Payer: Self-pay | Admitting: Thoracic Surgery (Cardiothoracic Vascular Surgery)

## 2018-06-05 ENCOUNTER — Ambulatory Visit (INDEPENDENT_AMBULATORY_CARE_PROVIDER_SITE_OTHER): Payer: Self-pay | Admitting: Thoracic Surgery (Cardiothoracic Vascular Surgery)

## 2018-06-05 VITALS — BP 122/68 | Temp 98.0°F | Resp 18 | Ht 66.5 in | Wt 215.0 lb

## 2018-06-05 DIAGNOSIS — C3491 Malignant neoplasm of unspecified part of right bronchus or lung: Secondary | ICD-10-CM

## 2018-06-05 DIAGNOSIS — C3490 Malignant neoplasm of unspecified part of unspecified bronchus or lung: Secondary | ICD-10-CM

## 2018-06-05 DIAGNOSIS — R079 Chest pain, unspecified: Secondary | ICD-10-CM | POA: Diagnosis not present

## 2018-06-05 DIAGNOSIS — Z902 Acquired absence of lung [part of]: Secondary | ICD-10-CM

## 2018-06-05 NOTE — Progress Notes (Signed)
[address]       ,[place] [address]             [phone]     HPI: Hannah Green returns for a scheduled postoperative follow up visit  Hannah Green is a 73 year old woman with a history of tobacco abuse, COPD, obesity, rheumatoid arthritis, and breast cancer.  She had a lung nodule discovered on a screening CT in December 2018.  A year later it had doubled in size.  I did a thoracoscopic right upper middle lobectomy on 04/23/2018.  It turned out to be a stage Ib (T2a, N0) squamous cell carcinoma.  Postoperative course was complicated by prolonged air leak, transient postoperative atrial fibrillation, cellulitis in her chest tube site, and pneumonia requiring readmission.  She was treated with a 10-day course of vancomycin.  I saw her in the office on 05/15/2018.  She was doing well at that time.  She was not having much pain.  She did get short of breath with exertion.  Today she complains of a sensation of tightness and discomfort along the right costal margin.  She is having some occasional pins-and-needles discomfort there as well.  She complains of feeling short of breath with relatively mild exertion.  Past Medical History:  Diagnosis Date  . Breast cancer ([place]) 1999  . Cataract 2017   resolved with surgery  . Coronary artery disease   . GERD (gastroesophageal reflux disease)   . Hyperlipidemia   . Rheumatoid arthritis(714.0)     Current Outpatient Medications  Medication Sig Dispense Refill  . acetaminophen (TYLENOL) 325 MG tablet Take 2 tablets (650 mg total) by mouth every 6 (six) hours as needed for mild pain or fever.    Marland Kitchen aspirin EC 81 MG tablet Take 1 tablet (81 mg total) by mouth daily. 90 tablet 3  . atorvastatin (LIPITOR) 10 MG tablet Take 1 tablet (10 mg total) by mouth daily. 90 tablet 3  . Cholecalciferol (VITAMIN D3) 1.25 MG (50000 UT) CAPS TAKE 1 CAPSULE BY MOUTH ONE TIME PER WEEK. NOT COVERED BY INSURANCE. 12 capsule 0  . Coenzyme  Q10 (CO Q-10) 300 MG CAPS Take 300 mg by mouth daily.    . Fluticasone-Salmeterol (ADVAIR DISKUS) 250-50 MCG/DOSE AEPB Inhale 1 puff into the lungs 2 (two) times daily.    . metoprolol tartrate (LOPRESSOR) 25 MG tablet Take 1 tablet (25 mg total) by mouth 2 (two) times daily. 60 tablet 2  . metroNIDAZOLE (METROGEL) 1 % gel Apply topically daily. (Patient taking differently: Apply 1 application topically daily as needed (rosacea). ) 30 g 1  . ondansetron (ZOFRAN) 4 MG tablet Take 1 tablet (4 mg total) by mouth every 8 (eight) hours as needed for nausea or vomiting. 10 tablet 1  . VENTOLIN HFA 108 (90 Base) MCG/ACT inhaler TAKE 2 PUFFS BY MOUTH EVERY 6 HOURS AS NEEDED FOR WHEEZE OR SHORTNESS OF BREATH (Patient taking differently: Inhale 2 puffs into the lungs every 6 (six) hours as needed for shortness of breath. ) 18 Inhaler 3   No current facility-administered medications for this visit.     Physical Exam BP 122/68 (BP Location: Right Arm, Patient Position: Sitting, Cuff Size: Large)   Temp 98 F (36.7 C) (Oral)   Resp 18   Ht 5' 6.5" (1.689 m)   Wt 215 lb (97.5 kg)   LMP  (LMP Unknown)   SpO2 95% Comment: RA  BMI 34.66 kg/m  73 year old woman in  no acute distress distress Alert and oriented x3 with no focal deficits Lungs diminished at right base, otherwise clear Incisions healing well with no erythema or induration Cardiac regular rate and rhythm  Diagnostic Tests: CHEST - 2 VIEW  COMPARISON:  05/15/2018 and earlier.  FINDINGS: Cardiac silhouette normal in size, unchanged. Thoracic aorta mildly tortuous and atherosclerotic, unchanged. Prominent central pulmonary arteries, unchanged. Post surgical scarring involving the RIGHT lung and post surgical pleural scarring on the RIGHT, unchanged. No new pulmonary parenchymal abnormalities. No pleural effusions. Mild degenerative changes involving the thoracic spine.  IMPRESSION: No acute cardiopulmonary disease. Stable post surgical  pleuroparenchymal scarring on the RIGHT.   Electronically Signed   By: Evangeline Dakin M.D.   On: 06/05/2018 10:49  I personally reviewed the chest x-ray images and concur with the findings noted above  Impression: Hannah Green is a 73 year old woman with a history of tobacco abuse who had a right upper and middle lobectomy for a T2, N0, stage IB squamous cell carcinoma on 04/23/2018.   Prolonged air leak-resolved during hospitalization.  Her postoperative course was complicated by transient atrial fibrillation which converted with metoprolol and amiodarone.  The amiodarone was discontinued while in the hospital.   She was readmitted with possible pneumonia and cellulitis of the chest tube site.  She was treated with intravenous vancomycin.  She received a total of a 10-day course.  Her cellulitis is completely resolved.  Overall at this point I think she is doing about as well as you would expect.  She is only about 6 weeks out from surgery.  She is having some discomfort.  She does not want narcotics for that but asked about a muscle relaxant.  I do not think this is muscular nature I suspect that it is neurogenic in origin.  We discussed potentially using gabapentin or Lyrica, but she opted not to do that at this time.  It will improve with time.  Exercise tolerance is reduced.  That should also improve with time although she will need to get into a regular exercise program and work on weight loss as well.  Plan: DC hydrochlorothiazide.  Check blood pressure at next visit and if normal consider discontinuing metoprolol. Follow-up with Dr. Julien Nordmann next week as scheduled Return in 1 month with PA and lateral chest x-ray  Melrose Nakayama, MD [facility] [phone]

## 2018-06-11 ENCOUNTER — Telehealth: Payer: Self-pay | Admitting: Medical Oncology

## 2018-06-11 ENCOUNTER — Other Ambulatory Visit: Payer: Self-pay | Admitting: Medical Oncology

## 2018-06-11 DIAGNOSIS — C3490 Malignant neoplasm of unspecified part of unspecified bronchus or lung: Secondary | ICD-10-CM

## 2018-06-11 DIAGNOSIS — C44311 Basal cell carcinoma of skin of nose: Secondary | ICD-10-CM

## 2018-06-11 NOTE — Telephone Encounter (Signed)
Pt called back re Pre-screening questions.  She has a negative travel history ,denies fever, cough,Covid exposure.

## 2018-06-12 ENCOUNTER — Inpatient Hospital Stay: Payer: Medicare HMO

## 2018-06-12 ENCOUNTER — Encounter: Payer: Self-pay | Admitting: Internal Medicine

## 2018-06-12 ENCOUNTER — Inpatient Hospital Stay: Payer: Medicare HMO | Attending: Internal Medicine | Admitting: Internal Medicine

## 2018-06-12 ENCOUNTER — Other Ambulatory Visit: Payer: Self-pay

## 2018-06-12 VITALS — BP 162/66 | HR 75 | Temp 98.7°F | Resp 20 | Ht 66.5 in | Wt 214.9 lb

## 2018-06-12 DIAGNOSIS — Z902 Acquired absence of lung [part of]: Secondary | ICD-10-CM | POA: Diagnosis not present

## 2018-06-12 DIAGNOSIS — C44311 Basal cell carcinoma of skin of nose: Secondary | ICD-10-CM | POA: Diagnosis not present

## 2018-06-12 DIAGNOSIS — Z923 Personal history of irradiation: Secondary | ICD-10-CM | POA: Diagnosis not present

## 2018-06-12 DIAGNOSIS — C3490 Malignant neoplasm of unspecified part of unspecified bronchus or lung: Secondary | ICD-10-CM

## 2018-06-12 DIAGNOSIS — R5383 Other fatigue: Secondary | ICD-10-CM | POA: Diagnosis not present

## 2018-06-12 DIAGNOSIS — Z7951 Long term (current) use of inhaled steroids: Secondary | ICD-10-CM | POA: Diagnosis not present

## 2018-06-12 DIAGNOSIS — Z801 Family history of malignant neoplasm of trachea, bronchus and lung: Secondary | ICD-10-CM | POA: Insufficient documentation

## 2018-06-12 DIAGNOSIS — Z7289 Other problems related to lifestyle: Secondary | ICD-10-CM | POA: Diagnosis not present

## 2018-06-12 DIAGNOSIS — Z853 Personal history of malignant neoplasm of breast: Secondary | ICD-10-CM | POA: Diagnosis not present

## 2018-06-12 DIAGNOSIS — C3491 Malignant neoplasm of unspecified part of right bronchus or lung: Secondary | ICD-10-CM | POA: Insufficient documentation

## 2018-06-12 DIAGNOSIS — Z7189 Other specified counseling: Secondary | ICD-10-CM | POA: Insufficient documentation

## 2018-06-12 DIAGNOSIS — Z87891 Personal history of nicotine dependence: Secondary | ICD-10-CM | POA: Diagnosis not present

## 2018-06-12 DIAGNOSIS — Z808 Family history of malignant neoplasm of other organs or systems: Secondary | ICD-10-CM

## 2018-06-12 DIAGNOSIS — R634 Abnormal weight loss: Secondary | ICD-10-CM

## 2018-06-12 DIAGNOSIS — I2510 Atherosclerotic heart disease of native coronary artery without angina pectoris: Secondary | ICD-10-CM | POA: Insufficient documentation

## 2018-06-12 DIAGNOSIS — C3411 Malignant neoplasm of upper lobe, right bronchus or lung: Secondary | ICD-10-CM | POA: Insufficient documentation

## 2018-06-12 DIAGNOSIS — Z803 Family history of malignant neoplasm of breast: Secondary | ICD-10-CM | POA: Diagnosis not present

## 2018-06-12 DIAGNOSIS — Z79899 Other long term (current) drug therapy: Secondary | ICD-10-CM | POA: Diagnosis not present

## 2018-06-12 DIAGNOSIS — E785 Hyperlipidemia, unspecified: Secondary | ICD-10-CM | POA: Diagnosis not present

## 2018-06-12 LAB — CBC WITH DIFFERENTIAL (CANCER CENTER ONLY)
Abs Immature Granulocytes: 0.04 10*3/uL (ref 0.00–0.07)
Basophils Absolute: 0.1 10*3/uL (ref 0.0–0.1)
Basophils Relative: 1 %
Eosinophils Absolute: 0.1 10*3/uL (ref 0.0–0.5)
Eosinophils Relative: 2 %
HCT: 42.2 % (ref 36.0–46.0)
Hemoglobin: 13.5 g/dL (ref 12.0–15.0)
Immature Granulocytes: 0 %
Lymphocytes Relative: 33 %
Lymphs Abs: 3 10*3/uL (ref 0.7–4.0)
MCH: 30.1 pg (ref 26.0–34.0)
MCHC: 32 g/dL (ref 30.0–36.0)
MCV: 94.2 fL (ref 80.0–100.0)
Monocytes Absolute: 0.8 10*3/uL (ref 0.1–1.0)
Monocytes Relative: 9 %
Neutro Abs: 5 10*3/uL (ref 1.7–7.7)
Neutrophils Relative %: 55 %
Platelet Count: 330 10*3/uL (ref 150–400)
RBC: 4.48 MIL/uL (ref 3.87–5.11)
RDW: 13.6 % (ref 11.5–15.5)
WBC Count: 9 10*3/uL (ref 4.0–10.5)
nRBC: 0 % (ref 0.0–0.2)

## 2018-06-12 LAB — CMP (CANCER CENTER ONLY)
ALT: 17 U/L (ref 0–44)
AST: 19 U/L (ref 15–41)
Albumin: 3.7 g/dL (ref 3.5–5.0)
Alkaline Phosphatase: 72 U/L (ref 38–126)
Anion gap: 11 (ref 5–15)
BUN: 15 mg/dL (ref 8–23)
CO2: 26 mmol/L (ref 22–32)
Calcium: 9.2 mg/dL (ref 8.9–10.3)
Chloride: 103 mmol/L (ref 98–111)
Creatinine: 0.72 mg/dL (ref 0.44–1.00)
GFR, Est AFR Am: >60 mL/min (ref >60)
GFR, Estimated: >60 mL/min (ref >60)
Glucose, Bld: 101 mg/dL — ABNORMAL HIGH (ref 70–99)
Potassium: 3.9 mmol/L (ref 3.5–5.1)
Sodium: 140 mmol/L (ref 135–145)
Total Bilirubin: 0.7 mg/dL (ref 0.3–1.2)
Total Protein: 7.2 g/dL (ref 6.5–8.1)

## 2018-06-12 NOTE — Progress Notes (Signed)
[place] [phone]   [phone]) [phone]  CONSULT NOTE  REFERRING PHYSICIAN: Dr. Modesto Charon  REASON FOR CONSULTATION:  73 years old white female recently diagnosed with lung cancer.  HPI Hannah Green is a 73 y.o. female with past medical history significant for right breast cancer status post lumpectomy and radiotherapy in 1999, GERD, rheumatoid arthritis as well as long history for smoking but quit 7 years ago.  The patient was seen for CT screening in December of 2018 and it showed 5.6 mm subpleural nodule in the anterior right upper lobe.  Repeat CT screening on March 01, 2018 showed enlargement of the nodule in the anterior aspect of the right upper lobe measuring 9.0 mm.  The patient had a PET scan on March 09, 2018 and that showed hypermetabolic 0.9 cm anterior right upper lobe pulmonary nodule.  There was no evidence for hypermetabolic lymphadenopathy in the hila or mediastinum and no other suspicious hypermetabolic disease identified in the neck, chest, abdomen and pelvis. The patient was referred to Dr. Roxan Hockey and on April 20, 2018 she underwent right VATS with right upper and middle bilobectomies with mediastinal lymph node dissection. 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  There was no evidence for lymphovascular invasion or metastatic disease to the dissected lymph nodes. Her surgery was complicated with air leak and the patient had chest tube placed. Dr. Roxan Hockey kindly referred the patient to me today for evaluation and recommendation regarding her condition. When seen today she is feeling fine except for intermittent pain on the right side of the chest as well as shortness of breath with exertion but no significant cough or hemoptysis.  She lost few pounds during her hospitalization.  She denied having any nausea, vomiting,  diarrhea or constipation.  She has no headache or visual changes. Family history significant for mother with breast cancer, father had lung cancer and sister had throat cancer. The patient is single and has 2 children a son and daughter.  She used to work in Horticulturist, commercial.  She has a history for smoking for around 47 years and quit 7 years ago.  The patient drinks alcohol occasionally and no history of drug abuse. HPI  Past Medical History:  Diagnosis Date  . Breast cancer ([place]) 1999  . Cataract 2017   resolved with surgery  . Coronary artery disease   . GERD (gastroesophageal reflux disease)   . Hyperlipidemia   . Rheumatoid arthritis(714.0)     Past Surgical History:  Procedure Laterality Date  . BREAST LUMPECTOMY  1999   left, needed radiation  . CATARACT EXTRACTION W/ INTRAOCULAR LENS  IMPLANT, BILATERAL Bilateral 05/19/15, 06/02/15  . CHEST TUBE INSERTION Right 04/23/2018   Procedure: Right Lateral Chest Tube Insertion;  Surgeon: Melrose Nakayama, MD;  Location: [facility];  Service: Thoracic;  Laterality: Right;  . CHOLECYSTECTOMY    . LOBECTOMY Right 04/23/2018   Procedure: RIGHT UPPER LOBECTOMY;  Surgeon: Melrose Nakayama, MD;  Location: [facility];  Service: Thoracic;  Laterality: Right;  . NM MYOVIEW LTD  01/2016   LOW RISK. Hypertensive response to exercise followed by significant drop (stage I pressure 211/70 drop to 118/75. Stage II). Apical artifact but otherwise no ischemia or infarction. EF 64%.  Marland Kitchen NODE DISSECTION Right 04/23/2018   Procedure: MEDIASTINAL NODE DISSECTION;  Surgeon: Melrose Nakayama, MD;  Location: [facility];  Service: Thoracic;  Laterality: Right;  .  RIGHT/LEFT HEART CATH AND CORONARY ANGIOGRAPHY N/A 04/10/2018   Procedure: RIGHT/LEFT HEART CATH AND CORONARY ANGIOGRAPHY;  Surgeon: Leonie Man, MD;  Location: [facility];; angiographically minimal coronary artery disease.  Normal LVEDP.  Basely normal RHC pressures with no suggestion of pulmonary  hypertension.  Normal CO-CI by Fick (5.9-2.8)  . TONSILLECTOMY    . TRANSTHORACIC ECHOCARDIOGRAM  11/'17; 2/'20   a) Nov 2017: Mild LVH. EF 60-65%. GR 2-DD; b) Feb 2020: Normal LV size and function.  EF 60 to 65%.  No R WMA.  GR 1 DD.  Normal RV size and function.  Normal pressures.  No valvular disease.  Marland Kitchen VIDEO ASSISTED THORACOSCOPY (VATS)/WEDGE RESECTION Right 04/23/2018   Procedure: VIDEO ASSISTED THORACOSCOPY (VATS)/WEDGE RESECTION OF RIGHT UPPER LUNG;  Surgeon: Melrose Nakayama, MD;  Location: [facility];  Service: Thoracic;  Laterality: Right;    Family History  Problem Relation Age of Onset  . Cancer Mother        breast  . Cancer Father        lung cancer  . Cancer Sister        throat cancer  . Diabetes Paternal Uncle   . Diabetes Maternal Grandmother     Social History Social History   Tobacco Use  . Smoking status: Former Smoker    Packs/day: 1.50    Years: 50.00    Pack years: 75.00    Types: Cigarettes    Last attempt to quit: 04/01/2011    Years since quitting: 7.2  . Smokeless tobacco: Never Used  . Tobacco comment: has stopped but has started again in "crisis"  Substance Use Topics  . Alcohol use: Yes    Alcohol/week: 2.0 standard drinks    Types: 2 Glasses of wine per week  . Drug use: No    Allergies  Allergen Reactions  . Adhesive [Tape] Rash    Paper tape causes rash  . Neosporin [Neomycin-Bacitracin Zn-Polymyx] Itching  . Penicillins Rash    Did it involve swelling of the face/tongue/throat, SOB, or low BP? No Did it involve sudden or severe rash/hives, skin peeling, or any reaction on the inside of your mouth or nose? Yes Did you need to seek medical attention at a hospital or doctor's office? Yes When did it last happen? From childhood If all above answers are "NO", may proceed with cephalosporin use.     Current Outpatient Medications  Medication Sig Dispense Refill  . acetaminophen (TYLENOL) 325 MG tablet Take 2 tablets (650 mg total) by  mouth every 6 (six) hours as needed for mild pain or fever.    Marland Kitchen aspirin EC 81 MG tablet Take 1 tablet (81 mg total) by mouth daily. 90 tablet 3  . atorvastatin (LIPITOR) 10 MG tablet Take 1 tablet (10 mg total) by mouth daily. 90 tablet 3  . Cholecalciferol (VITAMIN D3) 1.25 MG (50000 UT) CAPS TAKE 1 CAPSULE BY MOUTH ONE TIME PER WEEK. NOT COVERED BY INSURANCE. 12 capsule 0  . Coenzyme Q10 (CO Q-10) 300 MG CAPS Take 300 mg by mouth daily.    . Fluticasone-Salmeterol (ADVAIR DISKUS) 250-50 MCG/DOSE AEPB Inhale 1 puff into the lungs 2 (two) times daily.    . metoCLOPramide (REGLAN) 10 MG tablet Take 10 mg by mouth 4 (four) times daily.    . metoprolol tartrate (LOPRESSOR) 25 MG tablet Take 1 tablet (25 mg total) by mouth 2 (two) times daily. 60 tablet 2  . metroNIDAZOLE (METROGEL) 1 % gel Apply  topically daily. (Patient taking differently: Apply 1 application topically daily as needed (rosacea). ) 30 g 1  . ondansetron (ZOFRAN) 4 MG tablet Take 1 tablet (4 mg total) by mouth every 8 (eight) hours as needed for nausea or vomiting. 10 tablet 1  . VENTOLIN HFA 108 (90 Base) MCG/ACT inhaler TAKE 2 PUFFS BY MOUTH EVERY 6 HOURS AS NEEDED FOR WHEEZE OR SHORTNESS OF BREATH (Patient taking differently: Inhale 2 puffs into the lungs every 6 (six) hours as needed for shortness of breath. ) 18 Inhaler 3   No current facility-administered medications for this visit.     Review of Systems  Constitutional: positive for fatigue and weight loss Eyes: negative Ears, nose, mouth, throat, and face: negative Respiratory: positive for dyspnea on exertion and pleurisy/chest pain Cardiovascular: negative Gastrointestinal: negative Genitourinary:negative Integument/breast: negative Hematologic/lymphatic: negative Musculoskeletal:negative Neurological: negative Behavioral/Psych: negative Endocrine: negative Allergic/Immunologic: negative  Physical Exam  WUJ:WJXBJ, healthy, no distress, well nourished, well  developed and anxious SKIN: skin color, texture, turgor are normal, no rashes or significant lesions HEAD: Normocephalic, No masses, lesions, tenderness or abnormalities EYES: normal, PERRLA, Conjunctiva are pink and non-injected EARS: External ears normal, Canals clear OROPHARYNX:no exudate, no erythema and lips, buccal mucosa, and tongue normal  NECK: supple, no adenopathy, no JVD LYMPH:  no palpable lymphadenopathy, no hepatosplenomegaly BREAST:not examined LUNGS: clear to auscultation , and palpation HEART: regular rate & rhythm, no murmurs and no gallops ABDOMEN:abdomen soft, non-tender, normal bowel sounds and no masses or organomegaly BACK: Back symmetric, no curvature., No CVA tenderness EXTREMITIES:no joint deformities, effusion, or inflammation, no edema  NEURO: alert & oriented x 3 with fluent speech, no focal motor/sensory deficits  PERFORMANCE STATUS: ECOG 1  LABORATORY DATA: Lab Results  Component Value Date   WBC 9.0 06/12/2018   HGB 13.5 06/12/2018   HCT 42.2 06/12/2018   MCV 94.2 06/12/2018   PLT 330 06/12/2018      Chemistry      Component Value Date/Time   NA 140 06/12/2018 1346   NA 140 04/04/2018 1643   K 3.9 06/12/2018 1346   CL 103 06/12/2018 1346   CO2 26 06/12/2018 1346   BUN 15 06/12/2018 1346   BUN 13 04/04/2018 1643   CREATININE 0.72 06/12/2018 1346   CREATININE 0.61 06/01/2012 1452      Component Value Date/Time   CALCIUM 9.2 06/12/2018 1346   ALKPHOS 72 06/12/2018 1346   AST 19 06/12/2018 1346   ALT 17 06/12/2018 1346   BILITOT 0.7 06/12/2018 1346       RADIOGRAPHIC STUDIES: Dg Chest 2 View  Result Date: 06/05/2018 CLINICAL DATA:  Postop VATS for RIGHT UPPER LOBE lung cancer on 04/23/2018. RIGHT chest pain and shortness of breath. EXAM: CHEST - 2 VIEW COMPARISON:  05/15/2018 and earlier. FINDINGS: Cardiac silhouette normal in size, unchanged. Thoracic aorta mildly tortuous and atherosclerotic, unchanged. Prominent central pulmonary  arteries, unchanged. Post surgical scarring involving the RIGHT lung and post surgical pleural scarring on the RIGHT, unchanged. No new pulmonary parenchymal abnormalities. No pleural effusions. Mild degenerative changes involving the thoracic spine. IMPRESSION: No acute cardiopulmonary disease. Stable post surgical pleuroparenchymal scarring on the RIGHT. Electronically Signed   By: Evangeline Dakin M.D.   On: 06/05/2018 10:49   Dg Chest 2 View  Result Date: 05/15/2018 CLINICAL DATA:  VATS.  History of breast cancer. EXAM: CHEST - 2 VIEW COMPARISON:  05/10/2018.  04/28/2018. FINDINGS: Right PICC line noted with tip over superior vena cava. Mediastinum and hilar structures  normal. Heart size normal. Right base atelectasis/infiltrate. Surgical clips noted over the right chest. IMPRESSION: Right PICC line noted with tip over superior vena cava. 2. Right base atelectasis/infiltrate. No interim change from prior exam. Electronically Signed   By: Marcello Moores  Register   On: 05/15/2018 15:54    ASSESSMENT: This is a very pleasant 73 years old white female recently diagnosed with a stage Ib (T2 a, N0, M0) non-small cell lung cancer, squamous cell carcinoma presented with right upper lobe nodule status post right upper and right middle lobe bilobectomy as well as lymph node dissections under the care of Dr. Roxan Hockey on April 23, 2018.  The tumor was 1.7 cm in size but there was evidence for visceropleural involvement.   PLAN: I had a lengthy discussion with the patient today about her current disease stage, prognosis and treatment options. I explained to the patient that there is no survival benefit for adjuvant systemic chemotherapy for patient with a stage Ib unless the tumor is over 4.0 cm in size. I recommended for the patient to continue on observation with repeat CT scan of the chest in 6 months for restaging of her disease. The patient was advised to call immediately if she has any concerning symptoms  in the interval. The patient voices understanding of current disease status and treatment options and is in agreement with the current care plan.  All questions were answered. The patient knows to call the clinic with any problems, questions or concerns. We can certainly see the patient much sooner if necessary.  Thank you so much for allowing me to participate in the care of Hannah Green. I will continue to follow up the patient with you and assist in her care.  I spent 40 minutes counseling the patient face to face. The total time spent in the appointment was 60 minutes.  Disclaimer: This note was dictated with voice recognition software. Similar sounding words can inadvertently be transcribed and may not be corrected upon review.   Eilleen Kempf June 12, 2018, 2:51 PM

## 2018-07-03 ENCOUNTER — Other Ambulatory Visit: Payer: Self-pay | Admitting: *Deleted

## 2018-07-03 ENCOUNTER — Other Ambulatory Visit: Payer: Self-pay | Admitting: Thoracic Surgery (Cardiothoracic Vascular Surgery)

## 2018-07-03 ENCOUNTER — Other Ambulatory Visit: Payer: Self-pay

## 2018-07-03 ENCOUNTER — Encounter: Payer: Self-pay | Admitting: Thoracic Surgery (Cardiothoracic Vascular Surgery)

## 2018-07-03 ENCOUNTER — Ambulatory Visit (INDEPENDENT_AMBULATORY_CARE_PROVIDER_SITE_OTHER): Payer: Self-pay | Admitting: Thoracic Surgery (Cardiothoracic Vascular Surgery)

## 2018-07-03 ENCOUNTER — Ambulatory Visit
Admission: RE | Admit: 2018-07-03 | Discharge: 2018-07-03 | Disposition: A | Discharge status: Home / Self Care | Payer: Medicare HMO | Source: Ambulatory Visit | Attending: Thoracic Surgery (Cardiothoracic Vascular Surgery) | Admitting: Thoracic Surgery (Cardiothoracic Vascular Surgery)

## 2018-07-03 VITALS — BP 130/60 | HR 76 | Temp 97.9°F | Resp 18 | Ht 66.5 in | Wt 214.0 lb

## 2018-07-03 DIAGNOSIS — C3490 Malignant neoplasm of unspecified part of unspecified bronchus or lung: Secondary | ICD-10-CM

## 2018-07-03 DIAGNOSIS — C3491 Malignant neoplasm of unspecified part of right bronchus or lung: Secondary | ICD-10-CM

## 2018-07-03 DIAGNOSIS — Z902 Acquired absence of lung [part of]: Secondary | ICD-10-CM

## 2018-07-03 NOTE — Progress Notes (Signed)
[address]       [place] [address]             [phone]       HPI: [name] returns for a scheduled follow up visit  Hannah Green is a 73 yo woman with a history of echo abuse, COPD, breast cancer, coronary artery disease, hyperlipidemia, rheumatoid arthritis, who underwent a right upper and middle bilobectomy for a stage IB squamous cell carcinoma on 04/23/2018.  Her postoperative course was complicated by prolonged air leak, postoperative atrial fibrillation, cellulitis at her chest tube site, and pneumonia.  I last saw her in the office on 06/05/2018.  She was about 6 weeks postop at that time.  She was making progress, albeit slowly.  She saw Dr. Earlie Server in April.  No adjuvant chemotherapy was recommended.  She continues to have some incisional pain.  She is taking Tylenol for that occasionally.  She continues to have problems with wheezing.  She has not been able to get into see her pulmonologist due to [place].  She is using her albuterol inhaler about 4 times a day.  She is also using Advair regularly.  Her exercise tolerance is still limited.  Past Medical History:  Diagnosis Date  . Breast cancer ([place]) 1999  . Cataract 2017   resolved with surgery  . COPD (chronic obstructive pulmonary disease) ([place])   . Coronary artery disease   . GERD (gastroesophageal reflux disease)   . Hyperlipidemia   . Postoperative atrial fibrillation ([place]) 03/2018  . Rheumatoid arthritis(714.0)   . Squamous cell carcinoma of lung, stage I, right ([place]) 2020   Status post right upper and middle lobectomy    Current Outpatient Medications  Medication Sig Dispense Refill  . acetaminophen (TYLENOL) 325 MG tablet Take 2 tablets (650 mg total) by mouth every 6 (six) hours as needed for mild pain or fever.    Marland Kitchen aspirin EC 81 MG tablet Take 1 tablet (81 mg total) by mouth daily. 90 tablet 3  . atorvastatin (LIPITOR) 10 MG tablet Take 1 tablet (10 mg total) by mouth daily.  90 tablet 3  . Cholecalciferol (VITAMIN D3) 1.25 MG (50000 UT) CAPS TAKE 1 CAPSULE BY MOUTH ONE TIME PER WEEK. NOT COVERED BY INSURANCE. 12 capsule 0  . Coenzyme Q10 (CO Q-10) 300 MG CAPS Take 300 mg by mouth daily.    . Fluticasone-Salmeterol (ADVAIR DISKUS) 250-50 MCG/DOSE AEPB Inhale 1 puff into the lungs 2 (two) times daily.    . metoCLOPramide (REGLAN) 10 MG tablet Take 10 mg by mouth 4 (four) times daily. TAKES ONLY ONCE DAILY IN THE AM    . metroNIDAZOLE (METROGEL) 1 % gel Apply topically daily. (Patient taking differently: Apply 1 application topically daily as needed (rosacea). ) 30 g 1  . ondansetron (ZOFRAN) 4 MG tablet Take 1 tablet (4 mg total) by mouth every 8 (eight) hours as needed for nausea or vomiting. 10 tablet 1  . VENTOLIN HFA 108 (90 Base) MCG/ACT inhaler TAKE 2 PUFFS BY MOUTH EVERY 6 HOURS AS NEEDED FOR WHEEZE OR SHORTNESS OF BREATH (Patient taking differently: Inhale 2 puffs into the lungs every 6 (six) hours as needed for shortness of breath. ) 18 Inhaler 3   No current facility-administered medications for this visit.     Physical Exam BP 130/60 (BP Location: Left Arm, Patient Position: Sitting, Cuff Size: Large)   Pulse 76   Temp 97.9 F (36.6 C) (Temporal)  Resp 18   Ht 5' 6.5" (1.689 m)   Wt 214 lb (97.1 kg)   LMP  (LMP Unknown)   SpO2 96%   BMI 34.29 kg/m  73 year old woman in no acute distress Alert and oriented x3 with no focal deficits Incisions well-healed Lungs diminished at right base, otherwise clear Cardiac regular rate and rhythm normal S1 and S2  Diagnostic Tests: CHEST - 2 VIEW  COMPARISON:  Multiple prior most recent 06/05/2018  FINDINGS: Cardiomediastinal silhouette unchanged in size and contour.  Surgical changes of the right chest with tenting of the right hemidiaphragm and chronic linear opacities at the bases. No pleural effusion. No pneumothorax. No confluent airspace disease. Coarsened interstitial markings persist. No  displaced fracture. No evidence of edema.  IMPRESSION: Chronic lung changes and right lung surgical changes with no evidence of acute cardiopulmonary disease   Electronically Signed   By: Corrie Mckusick D.O.   On: 07/03/2018 09:15 I personally reviewed the CXR images and concur with the findings noted above  Impression: Hannah Green is a 73 year old woman with a history of tobacco abuse and COPD.  She underwent a thoracoscopic right upper and middle bilobectomy for a stage IB (T2 a, N0) squamous cell carcinoma on 04/23/2018.  Postoperative pain-she continues to have some pain but is managing that with acetaminophen at this point.  COPD-having difficulty with wheezing.  She has not been able to get into see pulmonology due to COVID restrictions.  She is using her Advair twice daily and her rescue albuterol several times a day as well.  Postoperative atrial fibrillation-converted to sinus rhythm with amiodarone and metoprolol.  Amiodarone was discontinued prior to discharge from hospital.  At this point I think we can stop her metoprolol as well.  Her blood pressure is borderline but that can be followed.  Exercise limitations-she would benefit from pulmonary rehab.  We will make that referral.  Plan: DC metoprolol Referral to pulmonary rehab Follow up with Pulmonology Return in 9 months for a 1 year follow up  Melrose Nakayama, MD [facility] [phone]

## 2018-10-03 ENCOUNTER — Other Ambulatory Visit: Payer: Self-pay | Admitting: Physician Assistant

## 2018-11-20 ENCOUNTER — Other Ambulatory Visit: Payer: Self-pay | Admitting: Pulmonary Disease

## 2018-11-29 ENCOUNTER — Other Ambulatory Visit (HOSPITAL_COMMUNITY)
Admission: RE | Admit: 2018-11-29 | Discharge: 2018-11-29 | Disposition: A | Discharge status: Home / Self Care | Payer: Medicare HMO | Source: Ambulatory Visit | Attending: Pulmonary Disease | Admitting: Pulmonary Disease

## 2018-11-29 DIAGNOSIS — Z20828 Contact with and (suspected) exposure to other viral communicable diseases: Secondary | ICD-10-CM | POA: Insufficient documentation

## 2018-11-29 DIAGNOSIS — Z01812 Encounter for preprocedural laboratory examination: Secondary | ICD-10-CM | POA: Diagnosis not present

## 2018-11-30 ENCOUNTER — Other Ambulatory Visit: Payer: Self-pay | Admitting: Pulmonary Disease

## 2018-11-30 DIAGNOSIS — J449 Chronic obstructive pulmonary disease, unspecified: Secondary | ICD-10-CM

## 2018-11-30 LAB — NOVEL CORONAVIRUS, NAA (HOSP ORDER, SEND-OUT TO REF LAB; TAT 18-24 HRS): SARS-CoV-2, NAA: NOT DETECTED

## 2018-12-03 ENCOUNTER — Other Ambulatory Visit: Payer: Self-pay

## 2018-12-03 ENCOUNTER — Encounter: Payer: Self-pay | Admitting: Pulmonary Disease

## 2018-12-03 ENCOUNTER — Ambulatory Visit: Payer: Medicare HMO | Admitting: Pulmonary Disease

## 2018-12-03 ENCOUNTER — Ambulatory Visit (INDEPENDENT_AMBULATORY_CARE_PROVIDER_SITE_OTHER): Payer: Medicare HMO | Admitting: Pulmonary Disease

## 2018-12-03 VITALS — BP 120/70 | HR 72 | Temp 97.5°F | Ht 66.5 in | Wt 228.0 lb

## 2018-12-03 DIAGNOSIS — J449 Chronic obstructive pulmonary disease, unspecified: Secondary | ICD-10-CM

## 2018-12-03 DIAGNOSIS — Z87891 Personal history of nicotine dependence: Secondary | ICD-10-CM | POA: Diagnosis not present

## 2018-12-03 DIAGNOSIS — C3491 Malignant neoplasm of unspecified part of right bronchus or lung: Secondary | ICD-10-CM

## 2018-12-03 LAB — PULMONARY FUNCTION TEST
DL/VA % pred: 108 %
DL/VA: 4.37 ml/min/mmHg/L
DLCO unc % pred: 85 %
DLCO unc: 17.94 ml/min/mmHg
FEF 25-75 Post: 0.61 L/sec
FEF 25-75 Pre: 0.43 L/sec
FEF2575-%Change-Post: 40 %
FEF2575-%Pred-Post: 32 %
FEF2575-%Pred-Pre: 22 %
FEV1-%Change-Post: 12 %
FEV1-%Pred-Post: 50 %
FEV1-%Pred-Pre: 44 %
FEV1-Post: 1.22 L
FEV1-Pre: 1.09 L
FEV1FVC-%Change-Post: 0 %
FEV1FVC-%Pred-Pre: 70 %
FEV6-%Change-Post: 12 %
FEV6-%Pred-Post: 71 %
FEV6-%Pred-Pre: 63 %
FEV6-Post: 2.18 L
FEV6-Pre: 1.94 L
FEV6FVC-%Change-Post: 0 %
FEV6FVC-%Pred-Post: 100 %
FEV6FVC-%Pred-Pre: 99 %
FVC-%Change-Post: 11 %
FVC-%Pred-Post: 71 %
FVC-%Pred-Pre: 63 %
FVC-Post: 2.28 L
FVC-Pre: 2.04 L
Post FEV1/FVC ratio: 54 %
Post FEV6/FVC ratio: 96 %
Pre FEV1/FVC ratio: 53 %
Pre FEV6/FVC Ratio: 95 %
RV % pred: 109 %
RV: 2.61 L
TLC % pred: 90 %
TLC: 4.9 L

## 2018-12-03 MED ORDER — ANORO ELLIPTA 62.5-25 MCG/INH IN AEPB: 1.0000 | INHALATION_SPRAY | Freq: Every day | RESPIRATORY_TRACT | 0 refills | Status: DC

## 2018-12-03 NOTE — Progress Notes (Signed)
PFT done today. 

## 2018-12-03 NOTE — Patient Instructions (Addendum)
Thank you for visiting Dr. Valeta Harms at [facility]. Today we recommend the following:  Samples of Anoro today  Stop advair diskus Continue albuterol as needed  Increase your exercise tolerance as capable.   Return in about 3 months (around 03/05/2019).    Please do your part to reduce the spread of COVID-19.

## 2018-12-03 NOTE — Progress Notes (Signed)
Synopsis: Referred in January 2020 for right upper lobe PET avid lung nodule by Jinny Sanders, MD  Subjective:   PATIENT ID: Hannah Green: female DOB: 06-Nov-1945, MRN: [id number]  Chief Complaint  Patient presents with  . Follow-up    PFT done today.     This is a 73 year old female with a past medical history of breast cancer diagnosed in 1999 status post resection and radiation.  Patient has a longstanding history of smoking, 50 pack years.  2 years ago the patient was enrolled in a lung cancer screening program.  Her first screening revealed a small right upper lobe nodule.  She ultimately had one-year follow-up screening that was completed in January 2020.  This revealed enlargement of the upper lobe nodule that is subpleural in nature.  She underwent a PET scan which revealed an SUV of 7.  Patient was referred to me to discuss biopsy or obtaining tissue sampling due to concern for malignancy.  Patient today has no complaints.  She recently had pulmonary function test completed which revealed an FEV1 postbronchodilator of 1.43 L which is 90% predicted.  Her DLCO was 143%.  She currently uses inhalers as needed for shortness of breath and wheezing.  Of note there was two-vessel calcified coronary disease noted on her LD CT.  She does have gastroesophageal reflux and colon polyps.  She is scheduled for an elective EGD colonoscopy for next week.  OV 12/03/2018: Since our last office visit patient was admitted in March 2020 for a lobectomy by Dr. 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  Last seen by Dr. Roxan Hockey in the office in May 2020.  She underwent a right upper and middle bilobectomy for a stage Ib squamous cell carcinoma.  Her course was complicated by prolonged air leak, postop atrial fibrillation, pneumonia and cellulitis at the chest tube insertion site.  Today, she is concerned that she has not fully recovered from her surgery.  She  feels a little bit depressed about this.  Her dyspnea on exertion is still there.  She occasionally has some pains in the right side of her chest and rib spaces.  She points to the locations in which she had chest tube insertions in the past.  Overall doing somewhat better.  Using her inhalers regularly.  Unfortunately she has been rather sedentary during this whole time.  She has not returned to her previous activity level.  She has not been back to walking.  I encouraged her to return to her previous athletic ability of least walking during each day.   Past Medical History:  Diagnosis Date  . Breast cancer ([place]) 1999  . Cataract 2017   resolved with surgery  . COPD (chronic obstructive pulmonary disease) ([place])   . Coronary artery disease   . GERD (gastroesophageal reflux disease)   . Hyperlipidemia   . Postoperative atrial fibrillation ([place]) 03/2018  . Rheumatoid arthritis(714.0)   . Squamous cell carcinoma of lung, stage I, right ([place]) 2020   Status post right upper and middle lobectomy     Family History  Problem Relation Age of Onset  . Cancer Mother        breast  . Cancer Father        lung cancer  . Cancer Sister        throat cancer  . Diabetes Paternal Uncle   . Diabetes Maternal Grandmother      Past Surgical  History:  Procedure Laterality Date  . BREAST LUMPECTOMY  1999   left, needed radiation  . CATARACT EXTRACTION W/ INTRAOCULAR LENS  IMPLANT, BILATERAL Bilateral 05/19/15, 06/02/15  . CHEST TUBE INSERTION Right 04/23/2018   Procedure: Right Lateral Chest Tube Insertion;  Surgeon: Melrose Nakayama, MD;  Location: [facility];  Service: Thoracic;  Laterality: Right;  . CHOLECYSTECTOMY    . LOBECTOMY Right 04/23/2018   Procedure: RIGHT UPPER LOBECTOMY;  Surgeon: Melrose Nakayama, MD;  Location: [facility];  Service: Thoracic;  Laterality: Right;  . NM MYOVIEW LTD  01/2016   LOW RISK. Hypertensive response to exercise followed by significant drop (stage I pressure 211/70  drop to 118/75. Stage II). Apical artifact but otherwise no ischemia or infarction. EF 64%.  Marland Kitchen NODE DISSECTION Right 04/23/2018   Procedure: MEDIASTINAL NODE DISSECTION;  Surgeon: Melrose Nakayama, MD;  Location: [facility];  Service: Thoracic;  Laterality: Right;  . RIGHT/LEFT HEART CATH AND CORONARY ANGIOGRAPHY N/A 04/10/2018   Procedure: RIGHT/LEFT HEART CATH AND CORONARY ANGIOGRAPHY;  Surgeon: Leonie Man, MD;  Location: [facility];; angiographically minimal coronary artery disease.  Normal LVEDP.  Basely normal RHC pressures with no suggestion of pulmonary hypertension.  Normal CO-CI by Fick (5.9-2.8)  . TONSILLECTOMY    . TRANSTHORACIC ECHOCARDIOGRAM  11/'17; 2/'20   a) Nov 2017: Mild LVH. EF 60-65%. GR 2-DD; b) Feb 2020: Normal LV size and function.  EF 60 to 65%.  No R WMA.  GR 1 DD.  Normal RV size and function.  Normal pressures.  No valvular disease.  Marland Kitchen VIDEO ASSISTED THORACOSCOPY (VATS)/WEDGE RESECTION Right 04/23/2018   Procedure: VIDEO ASSISTED THORACOSCOPY (VATS)/WEDGE RESECTION OF RIGHT UPPER LUNG;  Surgeon: Melrose Nakayama, MD;  Location: [facility];  Service: Thoracic;  Laterality: Right;    Social History   Socioeconomic History  . Marital status: Divorced    Spouse name: Not on file  . Number of children: 2  . Years of education: Not on file  . Highest education level: Not on file  Occupational History  . Occupation: Retired Freight forwarder    Comment: Patent examiner Needs  . Financial resource strain: Not on file  . Food insecurity    Worry: Not on file    Inability: Not on file  . Transportation needs    Medical: Not on file    Non-medical: Not on file  Tobacco Use  . Smoking status: Former Smoker    Packs/day: 1.50    Years: 50.00    Pack years: 75.00    Types: Cigarettes    Quit date: 04/01/2011    Years since quitting: 7.6  . Smokeless tobacco: Never Used  . Tobacco comment: has stopped but has started again in "crisis"  Substance and Sexual  Activity  . Alcohol use: Yes    Alcohol/week: 2.0 standard drinks    Types: 2 Glasses of wine per week  . Drug use: No  . Sexual activity: Never  Lifestyle  . Physical activity    Days per week: Not on file    Minutes per session: Not on file  . Stress: Not on file  Relationships  . Social Herbalist on phone: Not on file    Gets together: Not on file    Attends religious service: Not on file    Active member of club or organization: Not on file    Attends meetings of clubs or organizations: Not on file  Relationship status: Not on file  . Intimate partner violence    Fear of current or ex partner: Not on file    Emotionally abused: Not on file    Physically abused: Not on file    Forced sexual activity: Not on file  Other Topics Concern  . Not on file  Social History Narrative   Lives alone   No living will   Requests friend Porfirio Mylar to make health care decisions.   Would accept resuscitation but no prolonged ventilation   Would not want tube feeds if cognitively unaware     Allergies  Allergen Reactions  . Adhesive [Tape] Rash    Paper tape causes rash  . Neosporin [Neomycin-Bacitracin Zn-Polymyx] Itching  . Penicillins Rash    Did it involve swelling of the face/tongue/throat, SOB, or low BP? No Did it involve sudden or severe rash/hives, skin peeling, or any reaction on the inside of your mouth or nose? Yes Did you need to seek medical attention at a hospital or doctor's office? Yes When did it last happen? From childhood If all above answers are "NO", may proceed with cephalosporin use.      Outpatient Medications Prior to Visit  Medication Sig Dispense Refill  . aspirin EC 81 MG tablet Take 1 tablet (81 mg total) by mouth daily. 90 tablet 3  . Cholecalciferol (VITAMIN D3) 1.25 MG (50000 UT) CAPS TAKE 1 CAPSULE BY MOUTH ONE TIME PER WEEK. NOT COVERED BY INSURANCE. 12 capsule 0  . Fluticasone-Salmeterol (ADVAIR DISKUS) 250-50 MCG/DOSE AEPB  Inhale 1 puff into the lungs 2 (two) times daily.    . VENTOLIN HFA 108 (90 Base) MCG/ACT inhaler TAKE 2 PUFFS BY MOUTH EVERY 6 HOURS AS NEEDED FOR WHEEZE OR SHORTNESS OF BREATH (Patient taking differently: Inhale 2 puffs into the lungs every 6 (six) hours as needed for shortness of breath. ) 18 Inhaler 3  . acetaminophen (TYLENOL) 325 MG tablet Take 2 tablets (650 mg total) by mouth every 6 (six) hours as needed for mild pain or fever. (Patient not taking: Reported on 12/03/2018)    . atorvastatin (LIPITOR) 10 MG tablet Take 1 tablet (10 mg total) by mouth daily. (Patient not taking: Reported on 12/03/2018) 90 tablet 3  . Coenzyme Q10 (CO Q-10) 300 MG CAPS Take 300 mg by mouth daily.    . metoCLOPramide (REGLAN) 10 MG tablet Take 10 mg by mouth 4 (four) times daily. TAKES ONLY ONCE DAILY IN THE AM    . metroNIDAZOLE (METROGEL) 1 % gel Apply topically daily. (Patient not taking: Reported on 12/03/2018) 30 g 1  . ondansetron (ZOFRAN) 4 MG tablet Take 1 tablet (4 mg total) by mouth every 8 (eight) hours as needed for nausea or vomiting. (Patient not taking: Reported on 12/03/2018) 10 tablet 1   No facility-administered medications prior to visit.     Review of Systems  Constitutional: Negative for chills, fever, malaise/fatigue and weight loss.  HENT: Negative for hearing loss, sore throat and tinnitus.   Eyes: Negative for blurred vision and double vision.  Respiratory: Positive for shortness of breath. Negative for cough, hemoptysis, sputum production, wheezing and stridor.   Cardiovascular: Negative for chest pain, palpitations, orthopnea, leg swelling and PND.  Gastrointestinal: Negative for abdominal pain, constipation, diarrhea, heartburn, nausea and vomiting.  Genitourinary: Negative for dysuria, hematuria and urgency.  Musculoskeletal: Negative for joint pain and myalgias.  Skin: Negative for itching and rash.  Neurological: Negative for dizziness, tingling, weakness and headaches.  Endo/Heme/Allergies: Negative for environmental allergies. Does not bruise/bleed easily.  Psychiatric/Behavioral: Negative for depression. The patient is not nervous/anxious and does not have insomnia.   All other systems reviewed and are negative.    Objective:  Physical Exam Vitals signs reviewed.  Constitutional:      General: She is not in acute distress.    Appearance: She is well-developed.  HENT:     Head: Normocephalic and atraumatic.  Eyes:     General: No scleral icterus.    Conjunctiva/sclera: Conjunctivae normal.     Pupils: Pupils are equal, round, and reactive to light.  Neck:     Musculoskeletal: Neck supple.     Vascular: No JVD.     Trachea: No tracheal deviation.  Cardiovascular:     Rate and Rhythm: Normal rate and regular rhythm.     Heart sounds: Normal heart sounds. No murmur.  Pulmonary:     Effort: Pulmonary effort is normal. No tachypnea, accessory muscle usage or respiratory distress.     Breath sounds: Normal breath sounds. No stridor. No wheezing, rhonchi or rales.  Abdominal:     General: Bowel sounds are normal. There is no distension.     Palpations: Abdomen is soft.     Tenderness: There is no abdominal tenderness.  Musculoskeletal:        General: No tenderness.     Comments: Well-healed incision on the right lateral chest.  Chest tube scars also in the right lateral chest.  Lymphadenopathy:     Cervical: No cervical adenopathy.  Skin:    General: Skin is warm and dry.     Capillary Refill: Capillary refill takes less than 2 seconds.     Findings: No rash.  Neurological:     Mental Status: She is alert and oriented to person, place, and time.  Psychiatric:        Behavior: Behavior normal.      Vitals:   12/03/18 0944  BP: 120/70  Pulse: 72  Temp: (!) 97.5 F (36.4 C)  TempSrc: Temporal  SpO2: 97%  Weight: 228 lb (103.4 kg)  Height: 5' 6.5" (1.689 m)   97% on RA BMI Readings from Last 3 Encounters:  12/03/18 36.25 kg/m   07/03/18 34.02 kg/m  06/12/18 34.17 kg/m   Wt Readings from Last 3 Encounters:  12/03/18 228 lb (103.4 kg)  07/03/18 214 lb (97.1 kg)  06/12/18 214 lb 14.4 oz (97.5 kg)     CBC    Component Value Date/Time   WBC 9.0 06/12/2018 1346   WBC 13.2 (H) 05/10/2018 0347   RBC 4.48 06/12/2018 1346   HGB 13.5 06/12/2018 1346   HGB 15.2 04/04/2018 1643   HCT 42.2 06/12/2018 1346   HCT 45.5 04/04/2018 1643   PLT 330 06/12/2018 1346   PLT 406 04/04/2018 1643   MCV 94.2 06/12/2018 1346   MCV 91 04/04/2018 1643   MCH 30.1 06/12/2018 1346   MCHC 32.0 06/12/2018 1346   RDW 13.6 06/12/2018 1346   RDW 12.8 04/04/2018 1643   LYMPHSABS 3.0 06/12/2018 1346   MONOABS 0.8 06/12/2018 1346   EOSABS 0.1 06/12/2018 1346   BASOSABS 0.1 06/12/2018 1346    Chest Imaging: Lung cancer screening images 02/01/2017, 03/01/2018: Right upper lobe lung nodule increased in size from 2018- thousand 20  03/09/2018 nuclear medicine pet imaging: PET avid right upper lobe nodule, SUV 7.  Pulmonary Functions Testing Results: PFT Results Latest Ref Rng & Units 12/03/2018 03/06/2018  FVC-Pre L 2.04 2.11  FVC-Predicted Pre % 63 65  FVC-Post L 2.28 2.36  FVC-Predicted Post % 71 73  Pre FEV1/FVC % % 53 59  Post FEV1/FCV % % 54 60  FEV1-Pre L 1.09 1.25  FEV1-Predicted Pre % 44 50  FEV1-Post L 1.22 1.43  DLCO UNC% % 85 101  DLCO COR %Predicted % 108 125  TLC L 4.90 5.75  TLC % Predicted % 90 105  RV % Predicted % 109 144    FeNO: None   Pathology: None   Echocardiogram:   Study Conclusions  - Left ventricle: The cavity size was normal. Wall thickness was   increased in a pattern of mild LVH. Systolic function was normal.   The estimated ejection fraction was in the range of 60% to 65%.   Wall motion was normal; there were no regional wall motion   abnormalities. Features are consistent with a pseudonormal left   ventricular filling pattern, with concomitant abnormal relaxation   and increased filling  pressure (grade 2 diastolic dysfunction).  Heart Catheterization: None     Assessment & Plan:   Stage I squamous cell carcinoma of right lung [facility])  Former smoker  Stage 3 severe COPD by GOLD classification ([place])  Discussion:  This is a 73 year old female with severe COPD FEV1 50% predicted postbronchodilator response, significant bronchodilator change.  DLCO 85%.  Overall relatively unchanged from her preop pulmonary function test.  Her FEV1 is slightly reduced from 1.4 L to 1.2 L.  She would like to potentially change her inhaler regimen.  She is anxious about being on a long-term inhaled steroid.  I think it is reasonable to switch her to [employer].  If she continues to do okay without the ICS would be appropriate to hold.  If not if she continues to have change in her respiratory symptoms would switch her to Trelegy. Encouraged her to get back to her previous activities of daily living. Encouraged her to walk regularly and start a log to help increase her activities. She has a treadmill in the home and she also can walk in the neighborhood. We discussed this. She would be a candidate for pulmonary rehab however this is not going due to COVID restrictions. Patient to return to clinic in 3 months to see how she is doing. New prescription sent to pharmacy.  Greater than 50% of this patient's 25-minute of visit was been face-to-face discussing above recommendations and treatment plan.   Current Outpatient Medications:  .  aspirin EC 81 MG tablet, Take 1 tablet (81 mg total) by mouth daily., Disp: 90 tablet, Rfl: 3 .  Cholecalciferol (VITAMIN D3) 1.25 MG (50000 UT) CAPS, TAKE 1 CAPSULE BY MOUTH ONE TIME PER WEEK. NOT COVERED BY INSURANCE., Disp: 12 capsule, Rfl: 0 .  Fluticasone-Salmeterol (ADVAIR DISKUS) 250-50 MCG/DOSE AEPB, Inhale 1 puff into the lungs 2 (two) times daily., Disp: , Rfl:  .  VENTOLIN HFA 108 (90 Base) MCG/ACT inhaler, TAKE 2 PUFFS BY MOUTH EVERY 6 HOURS AS  NEEDED FOR WHEEZE OR SHORTNESS OF BREATH (Patient taking differently: Inhale 2 puffs into the lungs every 6 (six) hours as needed for shortness of breath. ), Disp: 18 Inhaler, Rfl: 3 .  umeclidinium-vilanterol (ANORO ELLIPTA) 62.5-25 MCG/INH AEPB, Inhale 1 puff into the lungs daily., Disp: 2 each, Rfl: 0   Garner Nash, DO [facility] 12/03/2018 10:20 AM

## 2018-12-06 ENCOUNTER — Other Ambulatory Visit: Payer: Self-pay | Admitting: *Deleted

## 2018-12-06 DIAGNOSIS — C3490 Malignant neoplasm of unspecified part of unspecified bronchus or lung: Secondary | ICD-10-CM

## 2018-12-07 ENCOUNTER — Inpatient Hospital Stay: Payer: Medicare HMO | Attending: Internal Medicine

## 2018-12-07 ENCOUNTER — Telehealth: Payer: Self-pay | Admitting: Family Medicine

## 2018-12-07 DIAGNOSIS — R7303 Prediabetes: Secondary | ICD-10-CM

## 2018-12-07 DIAGNOSIS — E559 Vitamin D deficiency, unspecified: Secondary | ICD-10-CM

## 2018-12-07 DIAGNOSIS — Z853 Personal history of malignant neoplasm of breast: Secondary | ICD-10-CM | POA: Insufficient documentation

## 2018-12-07 DIAGNOSIS — Z7289 Other problems related to lifestyle: Secondary | ICD-10-CM | POA: Insufficient documentation

## 2018-12-07 DIAGNOSIS — Z808 Family history of malignant neoplasm of other organs or systems: Secondary | ICD-10-CM | POA: Insufficient documentation

## 2018-12-07 DIAGNOSIS — C3411 Malignant neoplasm of upper lobe, right bronchus or lung: Secondary | ICD-10-CM | POA: Insufficient documentation

## 2018-12-07 DIAGNOSIS — Z87891 Personal history of nicotine dependence: Secondary | ICD-10-CM | POA: Insufficient documentation

## 2018-12-07 DIAGNOSIS — E785 Hyperlipidemia, unspecified: Secondary | ICD-10-CM

## 2018-12-07 DIAGNOSIS — Z801 Family history of malignant neoplasm of trachea, bronchus and lung: Secondary | ICD-10-CM | POA: Insufficient documentation

## 2018-12-07 DIAGNOSIS — Z923 Personal history of irradiation: Secondary | ICD-10-CM | POA: Insufficient documentation

## 2018-12-07 DIAGNOSIS — Z902 Acquired absence of lung [part of]: Secondary | ICD-10-CM | POA: Insufficient documentation

## 2018-12-07 DIAGNOSIS — Z803 Family history of malignant neoplasm of breast: Secondary | ICD-10-CM | POA: Insufficient documentation

## 2018-12-07 NOTE — Telephone Encounter (Signed)
-----   Message from Ellamae Sia sent at 12/07/2018 11:10 AM EDT ----- Regarding: Lab orders for Tuesday, 10.13.20 Patient is scheduled for CPX labs, please order future labs, Thanks , Karna Christmas

## 2018-12-10 ENCOUNTER — Telehealth: Payer: Self-pay | Admitting: Internal Medicine

## 2018-12-10 NOTE — Telephone Encounter (Signed)
Returned patient's phone call regarding 10/14 appointment, patient informed me her CT scan was never scheduled. Informed patient she will get a call back once I get further instructions from the provider.

## 2018-12-11 ENCOUNTER — Ambulatory Visit (INDEPENDENT_AMBULATORY_CARE_PROVIDER_SITE_OTHER): Payer: Medicare HMO

## 2018-12-11 ENCOUNTER — Ambulatory Visit: Payer: Medicare HMO

## 2018-12-11 ENCOUNTER — Other Ambulatory Visit (INDEPENDENT_AMBULATORY_CARE_PROVIDER_SITE_OTHER): Payer: Medicare HMO

## 2018-12-11 DIAGNOSIS — E785 Hyperlipidemia, unspecified: Secondary | ICD-10-CM | POA: Diagnosis not present

## 2018-12-11 DIAGNOSIS — R7303 Prediabetes: Secondary | ICD-10-CM | POA: Diagnosis not present

## 2018-12-11 DIAGNOSIS — E559 Vitamin D deficiency, unspecified: Secondary | ICD-10-CM | POA: Diagnosis not present

## 2018-12-11 DIAGNOSIS — Z0000 Encounter for general adult medical examination without abnormal findings: Secondary | ICD-10-CM | POA: Diagnosis not present

## 2018-12-11 LAB — COMPREHENSIVE METABOLIC PANEL
ALT: 14 U/L (ref 0–35)
AST: 15 U/L (ref 0–37)
Albumin: 3.8 g/dL (ref 3.5–5.2)
Alkaline Phosphatase: 73 U/L (ref 39–117)
BUN: 13 mg/dL (ref 6–23)
CO2: 30 mEq/L (ref 19–32)
Calcium: 9.2 mg/dL (ref 8.4–10.5)
Chloride: 103 mEq/L (ref 96–112)
Creatinine, Ser: 0.61 mg/dL (ref 0.40–1.20)
GFR: 96.05 mL/min (ref >60.00)
Glucose, Bld: 107 mg/dL — ABNORMAL HIGH (ref 70–99)
Potassium: 4.3 mEq/L (ref 3.5–5.1)
Sodium: 139 mEq/L (ref 135–145)
Total Bilirubin: 0.6 mg/dL (ref 0.2–1.2)
Total Protein: 6.8 g/dL (ref 6.0–8.3)

## 2018-12-11 LAB — LIPID PANEL
Cholesterol: 218 mg/dL — ABNORMAL HIGH (ref 0–200)
HDL: 57.9 mg/dL (ref >39.00)
LDL Cholesterol: 127 mg/dL — ABNORMAL HIGH (ref 0–99)
NonHDL: 159.62
Total CHOL/HDL Ratio: 4
Triglycerides: 165 mg/dL — ABNORMAL HIGH (ref 0.0–149.0)
VLDL: 33 mg/dL (ref 0.0–40.0)

## 2018-12-11 LAB — HEMOGLOBIN A1C: Hgb A1c MFr Bld: 5.9 % (ref 4.6–6.5)

## 2018-12-11 LAB — VITAMIN D 25 HYDROXY (VIT D DEFICIENCY, FRACTURES): VITD: 25.66 ng/mL — ABNORMAL LOW (ref 30.00–100.00)

## 2018-12-11 NOTE — Patient Instructions (Signed)
Hannah Green , Thank you for taking time to come for your Medicare Wellness Visit. I appreciate your ongoing commitment to your health goals. Please review the following plan we discussed and let me know if I can assist you in the future.   Screening recommendations/referrals: Colonoscopy: up to date, completed 11/22/2010 Mammogram: Patient will schedule appointment Bone Density: up to date, completed 01/04/2016 Recommended yearly ophthalmology/optometry visit for glaucoma screening and checkup Recommended yearly dental visit for hygiene and checkup  Vaccinations: Influenza vaccine: Patient will get this at next office visit  Pneumococcal vaccine: series completed  Tdap vaccine: up to date, completed 06/02/2011 Shingles vaccine: declined    Advanced directives: Please bring a copy of your POA (Power of [place]) and/or Living Will to your next appointment.   Conditions/risks identified: hyperlipidemia  Next appointment: 12/25/2018 @ 10 am    Preventive Care 65 Years and Older, Female Preventive care refers to lifestyle choices and visits with your health care provider that can promote health and wellness. What does preventive care include?  A yearly physical exam. This is also called an annual well check.  Dental exams once or twice a year.  Routine eye exams. Ask your health care provider how often you should have your eyes checked.  Personal lifestyle choices, including:  Daily care of your teeth and gums.  Regular physical activity.  Eating a healthy diet.  Avoiding tobacco and drug use.  Limiting alcohol use.  Practicing safe sex.  Taking low-dose aspirin every day.  Taking vitamin and mineral supplements as recommended by your health care provider. What happens during an annual well check? The services and screenings done by your health care provider during your annual well check will depend on your age, overall health, lifestyle risk factors, and family history of  disease. Counseling  Your health care provider may ask you questions about your:  Alcohol use.  Tobacco use.  Drug use.  Emotional well-being.  Home and relationship well-being.  Sexual activity.  Eating habits.  History of falls.  Memory and ability to understand (cognition).  Work and work Statistician.  Reproductive health. Screening  You may have the following tests or measurements:  Height, weight, and BMI.  Blood pressure.  Lipid and cholesterol levels. These may be checked every 5 years, or more frequently if you are over 24 years old.  Skin check.  Lung cancer screening. You may have this screening every year starting at age 68 if you have a 30-pack-year history of smoking and currently smoke or have quit within the past 15 years.  Fecal occult blood test (FOBT) of the stool. You may have this test every year starting at age 28.  Flexible sigmoidoscopy or colonoscopy. You may have a sigmoidoscopy every 5 years or a colonoscopy every 10 years starting at age 18.  Hepatitis C blood test.  Hepatitis B blood test.  Sexually transmitted disease (STD) testing.  Diabetes screening. This is done by checking your blood sugar (glucose) after you have not eaten for a while (fasting). You may have this done every 1-3 years.  Bone density scan. This is done to screen for osteoporosis. You may have this done starting at age 68.  Mammogram. This may be done every 1-2 years. Talk to your health care provider about how often you should have regular mammograms. Talk with your health care provider about your test results, treatment options, and if necessary, the need for more tests. Vaccines  Your health care provider may recommend certain  vaccines, such as:  Influenza vaccine. This is recommended every year.  Tetanus, diphtheria, and acellular pertussis (Tdap, Td) vaccine. You may need a Td booster every 10 years.  Zoster vaccine. You may need this after age 73.   Pneumococcal 13-valent conjugate (PCV13) vaccine. One dose is recommended after age 9.  Pneumococcal polysaccharide (PPSV23) vaccine. One dose is recommended after age 75. Talk to your health care provider about which screenings and vaccines you need and how often you need them. This information is not intended to replace advice given to you by your health care provider. Make sure you discuss any questions you have with your health care provider. Document Released: 03/13/2015 Document Revised: 11/04/2015 Document Reviewed: 12/16/2014 Elsevier Interactive Patient Education  2017 [employer] Prevention in the Home Falls can cause injuries. They can happen to people of all ages. There are many things you can do to make your home safe and to help prevent falls. What can I do on the outside of my home?  Regularly fix the edges of walkways and driveways and fix any cracks.  Remove anything that might make you trip as you walk through a door, such as a raised step or threshold.  Trim any bushes or trees on the path to your home.  Use bright outdoor lighting.  Clear any walking paths of anything that might make someone trip, such as rocks or tools.  Regularly check to see if handrails are loose or broken. Make sure that both sides of any steps have handrails.  Any raised decks and porches should have guardrails on the edges.  Have any leaves, snow, or ice cleared regularly.  Use sand or salt on walking paths during winter.  Clean up any spills in your garage right away. This includes oil or grease spills. What can I do in the bathroom?  Use night lights.  Install grab bars by the toilet and in the tub and shower. Do not use towel bars as grab bars.  Use non-skid mats or decals in the tub or shower.  If you need to sit down in the shower, use a plastic, non-slip stool.  Keep the floor dry. Clean up any water that spills on the floor as soon as it happens.  Remove soap  buildup in the tub or shower regularly.  Attach bath mats securely with double-sided non-slip rug tape.  Do not have throw rugs and other things on the floor that can make you trip. What can I do in the bedroom?  Use night lights.  Make sure that you have a light by your bed that is easy to reach.  Do not use any sheets or blankets that are too big for your bed. They should not hang down onto the floor.  Have a firm chair that has side arms. You can use this for support while you get dressed.  Do not have throw rugs and other things on the floor that can make you trip. What can I do in the kitchen?  Clean up any spills right away.  Avoid walking on wet floors.  Keep items that you use a lot in easy-to-reach places.  If you need to reach something above you, use a strong step stool that has a grab bar.  Keep electrical cords out of the way.  Do not use floor polish or wax that makes floors slippery. If you must use wax, use non-skid floor wax.  Do not have throw rugs and other things  on the floor that can make you trip. What can I do with my stairs?  Do not leave any items on the stairs.  Make sure that there are handrails on both sides of the stairs and use them. Fix handrails that are broken or loose. Make sure that handrails are as long as the stairways.  Check any carpeting to make sure that it is firmly attached to the stairs. Fix any carpet that is loose or worn.  Avoid having throw rugs at the top or bottom of the stairs. If you do have throw rugs, attach them to the floor with carpet tape.  Make sure that you have a light switch at the top of the stairs and the bottom of the stairs. If you do not have them, ask someone to add them for you. What else can I do to help prevent falls?  Wear shoes that:  Do not have high heels.  Have rubber bottoms.  Are comfortable and fit you well.  Are closed at the toe. Do not wear sandals.  If you use a stepladder:  Make  sure that it is fully opened. Do not climb a closed stepladder.  Make sure that both sides of the stepladder are locked into place.  Ask someone to hold it for you, if possible.  Clearly mark and make sure that you can see:  Any grab bars or handrails.  First and last steps.  Where the edge of each step is.  Use tools that help you move around (mobility aids) if they are needed. These include:  Canes.  Walkers.  Scooters.  Crutches.  Turn on the lights when you go into a dark area. Replace any light bulbs as soon as they burn out.  Set up your furniture so you have a clear path. Avoid moving your furniture around.  If any of your floors are uneven, fix them.  If there are any pets around you, be aware of where they are.  Review your medicines with your doctor. Some medicines can make you feel dizzy. This can increase your chance of falling. Ask your doctor what other things that you can do to help prevent falls. This information is not intended to replace advice given to you by your health care provider. Make sure you discuss any questions you have with your health care provider. Document Released: 12/11/2008 Document Revised: 07/23/2015 Document Reviewed: 03/21/2014 Elsevier Interactive Patient Education  2017 [employer].

## 2018-12-11 NOTE — Progress Notes (Signed)
Subjective:   Hannah Green is a 73 y.o. female who presents for Medicare Annual (Subsequent) preventive examination.  Review of Systems:    This visit is being conducted through telemedicine via telephone at the nurse health advisor's home address due to the COVID-19 pandemic. This patient has given me verbal consent via doximity to conduct this visit, patient states they are participating from their home address. Some vital signs may be absent or patient reported.    Patient identification: identified by name, DOB, and current address  Cardiac Risk Factors include: advanced age (>67men, >53 women);dyslipidemia;sedentary lifestyle     Objective:     Vitals: LMP  (LMP Unknown)   There is no height or weight on file to calculate BMI.  Advanced Directives 12/11/2018 06/12/2018 05/10/2018 04/23/2018 04/23/2018 04/19/2018 04/10/2018  Does Patient Have a Medical Advance Directive? Yes Yes Yes Yes Yes Yes Yes  Type of Paramedic of [place];Living will [place];Living will [place];Living will [place];Living will [place];Living will [place];Living will Living will;Healthcare Power of Attorney  Does patient want to make changes to medical advance directive? - - No - Patient declined No - Patient declined - No - Patient declined No - Patient declined  Copy of [place] in Chart? No - copy requested - - No - copy requested No - copy requested No - copy requested No - copy requested    Tobacco Social History   Tobacco Use  Smoking Status Former Smoker  . Packs/day: 1.50  . Years: 50.00  . Pack years: 75.00  . Types: Cigarettes  . Quit date: 04/01/2011  . Years since quitting: 7.7  Smokeless Tobacco Never Used  Tobacco Comment   has stopped but has started again in "crisis"     Counseling given: Not Answered Comment: has stopped but has started  again in "crisis"   Clinical Intake:  Pre-visit preparation completed: Yes  Pain : 0-10 Pain Score: 6  Pain Type: Acute pain Pain Location: Neck Pain Descriptors / Indicators: Discomfort Pain Onset: More than a month ago Pain Frequency: Intermittent Pain Relieving Factors: Biofreeze  Pain Relieving Factors: Biofreeze  Nutritional Risks: None Diabetes: No  How often do you need to have someone help you when you read instructions, pamphlets, or other written materials from your doctor or pharmacy?: 1 - Never What is the last grade level you completed in school?: 12th  Interpreter Needed?: No  Information entered by :: CJohnson, LPN  Past Medical History:  Diagnosis Date  . Breast cancer ([facility]) 1999  . Cataract 2017   resolved with surgery  . COPD (chronic obstructive pulmonary disease) ([facility])   . Coronary artery disease   . GERD (gastroesophageal reflux disease)   . Hyperlipidemia   . Postoperative atrial fibrillation ([facility]) 03/2018  . Rheumatoid arthritis(714.0)   . Squamous cell carcinoma of lung, stage I, right ([facility]) 2020   Status post right upper and middle lobectomy   Past Surgical History:  Procedure Laterality Date  . BREAST LUMPECTOMY  1999   left, needed radiation  . CATARACT EXTRACTION W/ INTRAOCULAR LENS  IMPLANT, BILATERAL Bilateral 05/19/15, 06/02/15  . CHEST TUBE INSERTION Right 04/23/2018   Procedure: Right Lateral Chest Tube Insertion;  Surgeon: Melrose Nakayama, MD;  Location: [facility];  Service: Thoracic;  Laterality: Right;  . CHOLECYSTECTOMY    . LOBECTOMY Right 04/23/2018   Procedure: RIGHT UPPER LOBECTOMY;  Surgeon: Melrose Nakayama, MD;  Location: [facility];  Service: Thoracic;  Laterality: Right;  . NM MYOVIEW LTD  01/2016   LOW RISK. Hypertensive response to exercise followed by significant drop (stage I pressure 211/70 drop to 118/75. Stage II). Apical artifact but otherwise no ischemia or infarction. EF 64%.  Marland Kitchen NODE DISSECTION Right 04/23/2018    Procedure: MEDIASTINAL NODE DISSECTION;  Surgeon: Melrose Nakayama, MD;  Location: [facility];  Service: Thoracic;  Laterality: Right;  . RIGHT/LEFT HEART CATH AND CORONARY ANGIOGRAPHY N/A 04/10/2018   Procedure: RIGHT/LEFT HEART CATH AND CORONARY ANGIOGRAPHY;  Surgeon: Leonie Man, MD;  Location: [facility];; angiographically minimal coronary artery disease.  Normal LVEDP.  Basely normal RHC pressures with no suggestion of pulmonary hypertension.  Normal CO-CI by Fick (5.9-2.8)  . TONSILLECTOMY    . TRANSTHORACIC ECHOCARDIOGRAM  11/'17; 2/'20   a) Nov 2017: Mild LVH. EF 60-65%. GR 2-DD; b) Feb 2020: Normal LV size and function.  EF 60 to 65%.  No R WMA.  GR 1 DD.  Normal RV size and function.  Normal pressures.  No valvular disease.  Marland Kitchen VIDEO ASSISTED THORACOSCOPY (VATS)/WEDGE RESECTION Right 04/23/2018   Procedure: VIDEO ASSISTED THORACOSCOPY (VATS)/WEDGE RESECTION OF RIGHT UPPER LUNG;  Surgeon: Melrose Nakayama, MD;  Location[facility];  Service: Thoracic;  Laterality: Right;   Family History  Problem Relation Age of Onset  . Cancer Mother        breast  . Cancer Father        lung cancer  . Cancer Sister        throat cancer  . Diabetes Paternal Uncle   . Diabetes Maternal Grandmother    Social History   Socioeconomic History  . Marital status: Divorced    Spouse name: Not on file  . Number of children: 2  . Years of education: Not on file  . Highest education level: Not on file  Occupational History  . Occupation: Retired Freight forwarder    Comment: Patent examiner Needs  . Financial resource strain: Not hard at all  . Food insecurity    Worry: Never true    Inability: Never true  . Transportation needs    Medical: No    Non-medical: No  Tobacco Use  . Smoking status: Former Smoker    Packs/day: 1.50    Years: 50.00    Pack years: 75.00    Types: Cigarettes    Quit date: 04/01/2011    Years since quitting: 7.7  . Smokeless tobacco: Never Used  .  Tobacco comment: has stopped but has started again in "crisis"  Substance and Sexual Activity  . Alcohol use: Yes    Alcohol/week: 2.0 standard drinks    Types: 2 Glasses of wine per week  . Drug use: No  . Sexual activity: Never  Lifestyle  . Physical activity    Days per week: 0 days    Minutes per session: 0 min  . Stress: Not at all  Relationships  . Social Herbalist on phone: Not on file    Gets together: Not on file    Attends religious service: Not on file    Active member of club or organization: Not on file    Attends meetings of clubs or organizations: Not on file    Relationship status: Not on file  Other Topics Concern  . Not on file  Social History Narrative   Lives alone  No living will   Requests friend Porfirio Mylar to make health care decisions.   Would accept resuscitation but no prolonged ventilation   Would not want tube feeds if cognitively unaware    Outpatient Encounter Medications as of 12/11/2018  Medication Sig  . aspirin EC 81 MG tablet Take 1 tablet (81 mg total) by mouth daily.  . Cholecalciferol (VITAMIN D3) 1.25 MG (50000 UT) CAPS TAKE 1 CAPSULE BY MOUTH ONE TIME PER WEEK. NOT COVERED BY INSURANCE.  Marland Kitchen Fluticasone-Salmeterol (ADVAIR DISKUS) 250-50 MCG/DOSE AEPB Inhale 1 puff into the lungs 2 (two) times daily.  Marland Kitchen umeclidinium-vilanterol (ANORO ELLIPTA) 62.5-25 MCG/INH AEPB Inhale 1 puff into the lungs daily.  . VENTOLIN HFA 108 (90 Base) MCG/ACT inhaler TAKE 2 PUFFS BY MOUTH EVERY 6 HOURS AS NEEDED FOR WHEEZE OR SHORTNESS OF BREATH (Patient taking differently: Inhale 2 puffs into the lungs every 6 (six) hours as needed for shortness of breath. )   No facility-administered encounter medications on file as of 12/11/2018.     Activities of Daily Living In your present state of health, do you have any difficulty performing the following activities: 12/11/2018 05/10/2018  Hearing? Y N  Comment some hearing loss -  Vision? N N   Difficulty concentrating or making decisions? N N  Walking or climbing stairs? N N  Dressing or bathing? N N  Doing errands, shopping? N N  Preparing Food and eating ? N -  Using the Toilet? N -  In the past six months, have you accidently leaked urine? N -  Do you have problems with loss of bowel control? N -  Managing your Medications? N -  Managing your Finances? N -  Housekeeping or managing your Housekeeping? N -  Some recent data might be hidden    Patient Care Team: Jinny Sanders, MD as PCP - General Leonie Man, MD as PCP - Cardiology (Cardiology) Thelma Comp, [place] as Consulting Physician (Optometry) Vevelyn Royals, MD as Consulting Physician (Ophthalmology) Carloyn Manner, MD as Referring Physician (Otolaryngology)    Assessment:   This is a routine wellness examination for [place].  Exercise Activities and Dietary recommendations Current Exercise Habits: The patient does not participate in regular exercise at present, Exercise limited by: None identified  Goals    . Increase physical activity     Starting 12/05/2017, I will continue to walk for 30 minutes 3 days per week.     . Patient Stated     12/11/2018, Patient states that she wants to increase her exercise in the future.        Fall Risk Fall Risk  12/11/2018 12/05/2017 11/24/2016 11/24/2015  Falls in the past year? 0 No No No  Risk for fall due to : Medication side effect - - -  Follow up Falls evaluation completed;Falls prevention discussed - - -   Is the patient's home free of loose throw rugs in walkways, pet beds, electrical cords, etc?   yes      Grab bars in the bathroom? no      Handrails on the stairs?   no      Adequate lighting?   yes  Timed Get Up and Go performed: n/a  Depression Screen PHQ 2/9 Scores 12/11/2018 12/05/2017 11/24/2016 11/24/2015  PHQ - 2 Score 0 1 1 0  PHQ- 9 Score 0 7 6 -     Cognitive Function MMSE - Mini Mental State Exam 12/11/2018 12/05/2017 11/24/2016  11/24/2015  Orientation to time 5  5 5 5   Orientation to Place 5 5 5 5   Registration 3 3 3 3   Attention/ Calculation 5 0 0 0  Recall 3 3 3 3   Language- name 2 objects - 0 0 0  Language- repeat 1 1 1 1   Language- follow 3 step command - 3 3 3   Language- read & follow direction - 0 0 0  Write a sentence - 0 0 0  Copy design - 0 0 0  Total score - 20 20 20   Mini Cog  Mini-Cog screen was completed. Maximum score is 22. A value of 0 denotes this part of the MMSE was not completed or the patient failed this part of the Mini-Cog screening.      Immunization History  Administered Date(s) Administered  . Influenza, High Dose Seasonal PF 12/05/2017  . Influenza,inj,Quad PF,6+ Mos 12/22/2015, 11/24/2016  . Pneumococcal Conjugate-13 12/22/2015  . Pneumococcal Polysaccharide-23 06/02/2011  . Td 06/02/2011    Qualifies for Shingles Vaccine? yes  Screening Tests Health Maintenance  Topic Date Due  . MAMMOGRAM  01/02/2018  . INFLUENZA VACCINE  09/29/2018  . COLONOSCOPY  11/21/2020  . TETANUS/TDAP  06/01/2021  . DEXA SCAN  Completed  . Hepatitis C Screening  Completed  . PNA vac Low Risk Adult  Completed    Cancer Screenings: Lung: Low Dose CT Chest recommended if Age 21-80 years, 30 pack-year currently smoking OR have quit w/in 15years. Patient does not qualify. Breast:  Up to date on Mammogram? No, Patient will call and schedule her own appointment.    Up to date of Bone Density/Dexa? Yes Colorectal: completed 11/22/2010  Additional Screenings:  Hepatitis C Screening: 11/24/2015     Plan:    Patient wants to increase exercise in the near future.   I have personally reviewed and noted the following in the patient's chart:   . Medical and social history . Use of alcohol, tobacco or illicit drugs  . Current medications and supplements . Functional ability and status . Nutritional status . Physical activity . Advanced directives . List of other physicians . Hospitalizations,  surgeries, and ER visits in previous 12 months . Vitals . Screenings to include cognitive, depression, and falls . Referrals and appointments  In addition, I have reviewed and discussed with patient certain preventive protocols, quality metrics, and best practice recommendations. A written personalized care plan for preventive services as well as general preventive health recommendations were provided to patient.     Andrez Grime, LPN  [id number]

## 2018-12-11 NOTE — Progress Notes (Signed)
No critical labs need to be addressed urgently. We will discuss labs in detail at upcoming office visit.   

## 2018-12-11 NOTE — Progress Notes (Signed)
PCP notes: none  Health Maintenance: Patient will schedule her own mammogram appointment. Patient will receive flu vaccine at next office visit. Patient declined Shingrix.     Abnormal Screenings: none    Patient concerns: Patient complains of neck pain. Onset about 2 months ago. Treatments tried: Biofreeze with minimal relief.     Nurse concerns: none    Next PCP appt.: 12/25/2018 @ 10 am

## 2018-12-12 ENCOUNTER — Other Ambulatory Visit: Payer: Self-pay | Admitting: Internal Medicine

## 2018-12-12 ENCOUNTER — Inpatient Hospital Stay: Payer: Medicare HMO | Admitting: Internal Medicine

## 2018-12-12 DIAGNOSIS — C3490 Malignant neoplasm of unspecified part of unspecified bronchus or lung: Secondary | ICD-10-CM

## 2018-12-13 ENCOUNTER — Telehealth: Payer: Self-pay | Admitting: Internal Medicine

## 2018-12-13 NOTE — Telephone Encounter (Signed)
Returned patient's phone call regarding rescheduling an appointment, informed patient she must get CT scan scheduled first and then we can schedule a follow-up visit. Left a voicemail.

## 2018-12-17 DIAGNOSIS — Z9841 Cataract extraction status, right eye: Secondary | ICD-10-CM | POA: Diagnosis not present

## 2018-12-17 DIAGNOSIS — H5212 Myopia, left eye: Secondary | ICD-10-CM | POA: Diagnosis not present

## 2018-12-17 DIAGNOSIS — Z9842 Cataract extraction status, left eye: Secondary | ICD-10-CM | POA: Diagnosis not present

## 2018-12-18 ENCOUNTER — Ambulatory Visit: Payer: Medicare HMO | Admitting: Cardiology

## 2018-12-19 ENCOUNTER — Telehealth: Payer: Self-pay | Admitting: Internal Medicine

## 2018-12-19 NOTE — Telephone Encounter (Signed)
Called pt per 10/14 sch message - unable to reach pt . Left message with appt date and times

## 2018-12-24 ENCOUNTER — Other Ambulatory Visit: Payer: Self-pay | Admitting: Family Medicine

## 2018-12-25 ENCOUNTER — Ambulatory Visit (INDEPENDENT_AMBULATORY_CARE_PROVIDER_SITE_OTHER): Payer: Medicare HMO | Admitting: Family Medicine

## 2018-12-25 ENCOUNTER — Inpatient Hospital Stay: Payer: Medicare HMO

## 2018-12-25 ENCOUNTER — Encounter (HOSPITAL_COMMUNITY): Payer: Self-pay

## 2018-12-25 ENCOUNTER — Encounter: Payer: Self-pay | Admitting: Family Medicine

## 2018-12-25 ENCOUNTER — Other Ambulatory Visit: Payer: Self-pay

## 2018-12-25 ENCOUNTER — Ambulatory Visit (HOSPITAL_COMMUNITY)
Admission: RE | Admit: 2018-12-25 | Discharge: 2018-12-25 | Disposition: A | Discharge status: Home / Self Care | Payer: Medicare HMO | Source: Ambulatory Visit | Attending: Internal Medicine | Admitting: Internal Medicine

## 2018-12-25 VITALS — BP 120/64 | HR 88 | Temp 98.2°F | Ht 65.5 in | Wt 223.8 lb

## 2018-12-25 DIAGNOSIS — R7303 Prediabetes: Secondary | ICD-10-CM

## 2018-12-25 DIAGNOSIS — Z801 Family history of malignant neoplasm of trachea, bronchus and lung: Secondary | ICD-10-CM | POA: Diagnosis not present

## 2018-12-25 DIAGNOSIS — Z7289 Other problems related to lifestyle: Secondary | ICD-10-CM | POA: Diagnosis not present

## 2018-12-25 DIAGNOSIS — J449 Chronic obstructive pulmonary disease, unspecified: Secondary | ICD-10-CM | POA: Diagnosis not present

## 2018-12-25 DIAGNOSIS — E785 Hyperlipidemia, unspecified: Secondary | ICD-10-CM | POA: Diagnosis not present

## 2018-12-25 DIAGNOSIS — C3411 Malignant neoplasm of upper lobe, right bronchus or lung: Secondary | ICD-10-CM | POA: Diagnosis not present

## 2018-12-25 DIAGNOSIS — C3490 Malignant neoplasm of unspecified part of unspecified bronchus or lung: Secondary | ICD-10-CM | POA: Diagnosis not present

## 2018-12-25 DIAGNOSIS — M542 Cervicalgia: Secondary | ICD-10-CM

## 2018-12-25 DIAGNOSIS — Z87891 Personal history of nicotine dependence: Secondary | ICD-10-CM | POA: Diagnosis not present

## 2018-12-25 DIAGNOSIS — I5189 Other ill-defined heart diseases: Secondary | ICD-10-CM | POA: Diagnosis not present

## 2018-12-25 DIAGNOSIS — Z853 Personal history of malignant neoplasm of breast: Secondary | ICD-10-CM | POA: Diagnosis not present

## 2018-12-25 DIAGNOSIS — Z803 Family history of malignant neoplasm of breast: Secondary | ICD-10-CM | POA: Diagnosis not present

## 2018-12-25 DIAGNOSIS — Z23 Encounter for immunization: Secondary | ICD-10-CM

## 2018-12-25 DIAGNOSIS — Z0000 Encounter for general adult medical examination without abnormal findings: Secondary | ICD-10-CM | POA: Diagnosis not present

## 2018-12-25 DIAGNOSIS — Z902 Acquired absence of lung [part of]: Secondary | ICD-10-CM | POA: Diagnosis not present

## 2018-12-25 DIAGNOSIS — Z923 Personal history of irradiation: Secondary | ICD-10-CM | POA: Diagnosis not present

## 2018-12-25 DIAGNOSIS — Z808 Family history of malignant neoplasm of other organs or systems: Secondary | ICD-10-CM | POA: Diagnosis not present

## 2018-12-25 LAB — CMP (CANCER CENTER ONLY)
ALT: 21 U/L (ref 0–44)
AST: 18 U/L (ref 15–41)
Albumin: 3.7 g/dL (ref 3.5–5.0)
Alkaline Phosphatase: 80 U/L (ref 38–126)
Anion gap: 10 (ref 5–15)
BUN: 19 mg/dL (ref 8–23)
CO2: 29 mmol/L (ref 22–32)
Calcium: 9.7 mg/dL (ref 8.9–10.3)
Chloride: 102 mmol/L (ref 98–111)
Creatinine: 0.77 mg/dL (ref 0.44–1.00)
GFR, Est AFR Am: >60 mL/min (ref >60)
GFR, Estimated: >60 mL/min (ref >60)
Glucose, Bld: 109 mg/dL — ABNORMAL HIGH (ref 70–99)
Potassium: 4.8 mmol/L (ref 3.5–5.1)
Sodium: 141 mmol/L (ref 135–145)
Total Bilirubin: 0.4 mg/dL (ref 0.3–1.2)
Total Protein: 7.2 g/dL (ref 6.5–8.1)

## 2018-12-25 LAB — CBC WITH DIFFERENTIAL (CANCER CENTER ONLY)
Abs Immature Granulocytes: 0.03 10*3/uL (ref 0.00–0.07)
Basophils Absolute: 0.1 10*3/uL (ref 0.0–0.1)
Basophils Relative: 1 %
Eosinophils Absolute: 0.1 10*3/uL (ref 0.0–0.5)
Eosinophils Relative: 1 %
HCT: 44.4 % (ref 36.0–46.0)
Hemoglobin: 14.7 g/dL (ref 12.0–15.0)
Immature Granulocytes: 0 %
Lymphocytes Relative: 29 %
Lymphs Abs: 2.7 10*3/uL (ref 0.7–4.0)
MCH: 31 pg (ref 26.0–34.0)
MCHC: 33.1 g/dL (ref 30.0–36.0)
MCV: 93.7 fL (ref 80.0–100.0)
Monocytes Absolute: 0.6 10*3/uL (ref 0.1–1.0)
Monocytes Relative: 7 %
Neutro Abs: 5.6 10*3/uL (ref 1.7–7.7)
Neutrophils Relative %: 62 %
Platelet Count: 365 10*3/uL (ref 150–400)
RBC: 4.74 MIL/uL (ref 3.87–5.11)
RDW: 13.2 % (ref 11.5–15.5)
WBC Count: 9.1 10*3/uL (ref 4.0–10.5)
nRBC: 0 % (ref 0.0–0.2)

## 2018-12-25 MED ORDER — ATORVASTATIN CALCIUM 10 MG PO TABS: 10.0000 mg | ORAL_TABLET | Freq: Every day | ORAL | 3 refills | Status: DC

## 2018-12-25 MED ORDER — MELOXICAM 15 MG PO TABS: 15.0000 mg | ORAL_TABLET | Freq: Every day | ORAL | 0 refills | Status: DC

## 2018-12-25 MED ORDER — SODIUM CHLORIDE (PF) 0.9 % IJ SOLN
INTRAMUSCULAR | Status: AC
  Filled 2018-12-25: qty 50

## 2018-12-25 MED ORDER — IOHEXOL 300 MG/ML  SOLN
75.0000 mL | Freq: Once | INTRAMUSCULAR | Status: AC | PRN
  Administered 2018-12-25: 75 mL via INTRAVENOUS

## 2018-12-25 NOTE — Assessment & Plan Note (Signed)
On trial of anoro. Followed by Pulmonary.

## 2018-12-25 NOTE — Assessment & Plan Note (Signed)
Poor control and coranary atherosclerosis seen on CT.  Start back atorvastatin.

## 2018-12-25 NOTE — Patient Instructions (Addendum)
Call to set up mammogam for re-eval.  Try to return to regular exercise.  Work on low carb, low chol diet.  Restart atorvastatin for cholesterol.  Increase vit D to 2000 mg daily.  Heat on neck, start short course of meloxicam.. if not improving neck pain call.

## 2018-12-25 NOTE — Progress Notes (Signed)
Chief Complaint  Patient presents with  . Annual Exam    Part 2    History of Present Illness: HPI  The patient presents for complete physical and review of chronic health problems. He/She also has the following acute concerns today: neck pain x 2 months  Left upper shoulder, hears grinding in neck.. No falls, no changing in activity. 8/10 on pain scale, not keeping her up at night.  Biofreeze.. helps minimally.  No numbness, no weakness in left arm.  The patient saw a LPN or RN for medicare wellness visit.  Prevention and wellness was reviewed in detail. Note reviewed and important notes copied below. Health Maintenance: Patient will schedule her own mammogram appointment. Patient will receive flu vaccine at next office visit. Patient declined Shingrix.  Abnormal Screenings: none Patient concerns: Patient complains of neck pain. Onset about 2 months ago. Treatments tried: Biofreeze with minimal relief.   12/25/18   She was found to have 84mm lung nodule in December 2018 through the lung cancer screening program. Follow-up scan in Jan 2020 showed increase size RUL pulmonary nodule measuring 32mm in size. Dx with non-small cell lung cancer.. treated with lobectomy in 03/2018, right upper, complicated by cellulitis of chest tube site, pneumonia and post op atrial fibrillation and HTN ( placed on amiodarone and converted to sinus). Of note there was two-vessel calcified coronary disease noted on her LD CT.   H as follow up with oncology today.. Dr. Fulton Reek.  HTN:  Now well controlled off lopressor and HCTZ. BP Readings from Last 3 Encounters:  12/25/18 120/64  12/03/18 120/70  07/03/18 130/60  Has follow up with  Dr.Harding  Prior.  Elevated Cholesterol:  She is not current ly on atovastatin.Marland Kitchen No SE. Lab Results  Component Value Date   CHOL 218 (H) 12/11/2018   HDL 57.90 12/11/2018   LDLCALC 127 (H) 12/11/2018   LDLDIRECT 136.2 06/03/2011   TRIG 165.0 (H) 12/11/2018   CHOLHDL  4 12/11/2018  Using medications without problems: Muscle aches:  Diet compliance: Exercise: sedentary Other complaints:  COPD: Using anoro since 12/03/2018 and albuterol prn Has follow up with pulmonary in 3 months.  She still has SOB, not at baseline.   Vit D: OTC supplement... 1000 mg daily  COVID 19 screen No recent travel or known exposure to [place] The patient denies respiratory symptoms of COVID 19 at this time.  The importance of social distancing was discussed today.   Review of Systems  Constitutional: Negative for chills and fever.  HENT: Negative for congestion and ear pain.   Eyes: Negative for pain and redness.  Respiratory: Negative for cough and shortness of breath.   Cardiovascular: Negative for chest pain, palpitations and leg swelling.  Gastrointestinal: Negative for abdominal pain, blood in stool, constipation, diarrhea, nausea and vomiting.  Genitourinary: Negative for dysuria.  Musculoskeletal: Negative for falls and myalgias.  Skin: Negative for rash.  Neurological: Negative for dizziness.  Psychiatric/Behavioral: Negative for depression. The patient is not nervous/anxious.       Past Medical History:  Diagnosis Date  . Breast cancer ([place]) 1999  . Cataract 2017   resolved with surgery  . COPD (chronic obstructive pulmonary disease) ([place])   . Coronary artery disease   . GERD (gastroesophageal reflux disease)   . Hyperlipidemia   . Postoperative atrial fibrillation ([place]) 03/2018  . Rheumatoid arthritis(714.0)   . Squamous cell carcinoma of lung, stage I, right ([place]) 2020   Status post right upper and middle lobectomy  reports that she quit smoking about 7 years ago. Her smoking use included cigarettes. She has a 75.00 pack-year smoking history. She has never used smokeless tobacco. She reports current alcohol use of about 2.0 standard drinks of alcohol per week. She reports that she does not use drugs.   Current Outpatient Medications:  .  aspirin  EC 81 MG tablet, Take 1 tablet (81 mg total) by mouth daily., Disp: 90 tablet, Rfl: 3 .  cholecalciferol (VITAMIN D3) 25 MCG (1000 UT) tablet, Take 1,000 Units by mouth daily., Disp: , Rfl:  .  Fluticasone-Salmeterol (ADVAIR DISKUS) 250-50 MCG/DOSE AEPB, Inhale 1 puff into the lungs 2 (two) times daily., Disp: , Rfl:  .  umeclidinium-vilanterol (ANORO ELLIPTA) 62.5-25 MCG/INH AEPB, Inhale 1 puff into the lungs daily., Disp: 2 each, Rfl: 0 .  VENTOLIN HFA 108 (90 Base) MCG/ACT inhaler, TAKE 2 PUFFS BY MOUTH EVERY 6 HOURS AS NEEDED FOR WHEEZE OR SHORTNESS OF BREATH, Disp: 18 Inhaler, Rfl: 3   Observations/Objective: Blood pressure 120/64, pulse 88, temperature 98.2 F (36.8 C), temperature source Temporal, height 5' 5.5" (1.664 m), weight 223 lb 12 oz (101.5 kg), SpO2 95 %.  Physical Exam Constitutional:      General: She is not in acute distress.    Appearance: Normal appearance. She is well-developed. She is not ill-appearing or toxic-appearing.  HENT:     Head: Normocephalic.     Right Ear: Hearing, tympanic membrane, ear canal and external ear normal.     Left Ear: Hearing, tympanic membrane, ear canal and external ear normal.     Nose: Nose normal.  Eyes:     General: Lids are normal. Lids are everted, no foreign bodies appreciated.     Conjunctiva/sclera: Conjunctivae normal.     Pupils: Pupils are equal, round, and reactive to light.  Neck:     Musculoskeletal: Normal range of motion and neck supple.     Thyroid: No thyroid mass or thyromegaly.     Vascular: No carotid bruit.     Trachea: Trachea normal.  Cardiovascular:     Rate and Rhythm: Normal rate and regular rhythm.     Heart sounds: Normal heart sounds, S1 normal and S2 normal. No murmur. No gallop.   Pulmonary:     Effort: Pulmonary effort is normal. No respiratory distress.     Breath sounds: Examination of the right-upper field reveals decreased breath sounds. Decreased breath sounds present. No wheezing, rhonchi or  rales.  Abdominal:     General: Bowel sounds are normal. There is no distension or abdominal bruit.     Palpations: Abdomen is soft. There is no fluid wave or mass.     Tenderness: There is no abdominal tenderness. There is no guarding or rebound.     Hernia: No hernia is present.  Lymphadenopathy:     Cervical: No cervical adenopathy.  Skin:    General: Skin is warm and dry.     Findings: No rash.  Neurological:     Mental Status: She is alert.     Cranial Nerves: No cranial nerve deficit.     Sensory: No sensory deficit.  Psychiatric:        Mood and Affect: Mood is not anxious or depressed.        Speech: Speech normal.        Behavior: Behavior normal. Behavior is cooperative.        Judgment: Judgment normal.      Assessment and Plan The patient's  preventative maintenance and recommended screening tests for an annual wellness exam were reviewed in full today. Brought up to date unless services declined.  Counselled on the importance of diet, exercise, and its role in overall health and mortality. The patient's FH and SH was reviewed, including their home life, tobacco status, and drug and alcohol status.   Vaccines: PCV13, 23 uptodate. Given flu vaccine. Due for shingles .Marland Kitchen Refused not covered by insurance. Mammo: nml 2018, overdue nowHx of breast CA. Plans to repeat. Last pap age 85, no further indicated, No DVE , low risk, no family history of ovarian or uterine cancer. DEXA:12/2015, plan repeat in 2022 Colon: Last 02/2010 nml,  PER  Dr. Lorie Apley last note: Repeat in 5years. Had to reschedule... postpone for now. Hep C: neg Former smoker, >40 years:  Hx of lung cancer spirometry 2020 severe COPD followed by pulmonary, Dr. Annamaria Boots  Patient Care Team: Jinny Sanders, MD as PCP - General Leonie Man, MD as PCP - Cardiology (Cardiology) Thelma Comp, [place] as Consulting Physician (Optometry) Vevelyn Royals, MD as Consulting Physician (Ophthalmology) Carloyn Manner, MD as Referring Physician (Otolaryngology) Deneise Lever, MD as Consulting Physician (Pulmonary Disease)   Hyperlipidemia with target LDL less than 100 Poor control and coranary atherosclerosis seen on CT.  Start back atorvastatin.  COPD (chronic obstructive pulmonary disease) (H[facility]) On trial of anoro. Followed by Pulmonary.  Prediabetes Encouraged exercise, weight loss, healthy eating habits.   Non-small cell lung cancer [facility]) Actively followed by oncology with [facility]. S/P lobectomy.Marland Kitchen no chemo or radiation needed at this time.  Acute neck pain  Most likely secondary to  OA and MSK pain. Treat with short course of NSAIDs... if not improving eval with X-ray.  Diastolic dysfunction without heart failure Euvolemic and followed by cardiology.  BP well controlled.     Eliezer Lofts, MD

## 2018-12-25 NOTE — Assessment & Plan Note (Signed)
Encouraged exercise, weight loss, healthy eating habits. ? ?

## 2018-12-25 NOTE — Assessment & Plan Note (Signed)
Most likely secondary to  OA and MSK pain. Treat with short course of NSAIDs... if not improving eval with X-ray.

## 2018-12-25 NOTE — Assessment & Plan Note (Signed)
Euvolemic and followed by cardiology.  BP well controlled.

## 2018-12-25 NOTE — Assessment & Plan Note (Signed)
Actively followed by oncology with [facility]. S/P lobectomy.Marland Kitchen no chemo or radiation needed at this time.

## 2018-12-27 ENCOUNTER — Inpatient Hospital Stay: Payer: Medicare HMO | Admitting: Internal Medicine

## 2018-12-31 ENCOUNTER — Telehealth: Payer: Self-pay | Admitting: Internal Medicine

## 2018-12-31 NOTE — Telephone Encounter (Signed)
Returned patient's phone call regarding rescheduling an appointment, left a voicemail. 

## 2019-01-01 ENCOUNTER — Telehealth: Payer: Self-pay | Admitting: Internal Medicine

## 2019-01-01 NOTE — Telephone Encounter (Signed)
Returned patient's phone call regarding rescheduling missed October appointments, per patient's request appointment has moved to 11/09.

## 2019-01-07 ENCOUNTER — Inpatient Hospital Stay: Payer: Medicare HMO | Attending: Internal Medicine | Admitting: Internal Medicine

## 2019-01-07 ENCOUNTER — Other Ambulatory Visit: Payer: Self-pay

## 2019-01-07 ENCOUNTER — Telehealth: Payer: Self-pay | Admitting: Internal Medicine

## 2019-01-07 ENCOUNTER — Encounter: Payer: Self-pay | Admitting: Internal Medicine

## 2019-01-07 VITALS — BP 132/60 | HR 88 | Temp 98.7°F | Resp 18 | Ht 65.5 in | Wt 227.0 lb

## 2019-01-07 DIAGNOSIS — I10 Essential (primary) hypertension: Secondary | ICD-10-CM | POA: Insufficient documentation

## 2019-01-07 DIAGNOSIS — C3411 Malignant neoplasm of upper lobe, right bronchus or lung: Secondary | ICD-10-CM | POA: Diagnosis not present

## 2019-01-07 DIAGNOSIS — Z7951 Long term (current) use of inhaled steroids: Secondary | ICD-10-CM | POA: Diagnosis not present

## 2019-01-07 DIAGNOSIS — Z791 Long term (current) use of non-steroidal anti-inflammatories (NSAID): Secondary | ICD-10-CM | POA: Insufficient documentation

## 2019-01-07 DIAGNOSIS — J449 Chronic obstructive pulmonary disease, unspecified: Secondary | ICD-10-CM | POA: Diagnosis not present

## 2019-01-07 DIAGNOSIS — Z79899 Other long term (current) drug therapy: Secondary | ICD-10-CM | POA: Diagnosis not present

## 2019-01-07 DIAGNOSIS — I4891 Unspecified atrial fibrillation: Secondary | ICD-10-CM | POA: Insufficient documentation

## 2019-01-07 DIAGNOSIS — E785 Hyperlipidemia, unspecified: Secondary | ICD-10-CM | POA: Insufficient documentation

## 2019-01-07 DIAGNOSIS — Z7982 Long term (current) use of aspirin: Secondary | ICD-10-CM | POA: Insufficient documentation

## 2019-01-07 DIAGNOSIS — E041 Nontoxic single thyroid nodule: Secondary | ICD-10-CM | POA: Insufficient documentation

## 2019-01-07 DIAGNOSIS — C3491 Malignant neoplasm of unspecified part of right bronchus or lung: Secondary | ICD-10-CM

## 2019-01-07 DIAGNOSIS — C3490 Malignant neoplasm of unspecified part of unspecified bronchus or lung: Secondary | ICD-10-CM | POA: Diagnosis not present

## 2019-01-07 DIAGNOSIS — M069 Rheumatoid arthritis, unspecified: Secondary | ICD-10-CM | POA: Diagnosis not present

## 2019-01-07 NOTE — Progress Notes (Signed)
[facility] Telephone:([phone]) [phone]   Fax:([phone]) [phone]  OFFICE PROGRESS NOTE  Jinny Sanders, MD [place] [address]  DIAGNOSIS: Stage IB (T2a, N0, M0) non-small cell lung cancer, squamous cell carcinoma presented with right upper lobe nodule  PRIOR THERAPY:  Status post right upper and right middle lobe bilobectomy as well as lymph node dissections under the care of Dr. Roxan Hockey on April 23, 2018.  The tumor was 1.7 cm in size but there was evidence for visceropleural involvement.  CURRENT THERAPY: Observation.  INTERVAL HISTORY: Hannah Green 73 y.o. female returns to the clinic today for 6 months follow-up visit.  The patient is feeling fine today with no concerning complaints except for the shortness of breath with exertion.  She denied having any chest pain, cough or hemoptysis.  She denied having any fever or chills.  The patient has no nausea, vomiting, diarrhea or constipation.  She has no headache or visual changes.  She is here today for evaluation with repeat CT scan of the chest for restaging of her disease.  MEDICAL HISTORY: Past Medical History:  Diagnosis Date  . Breast cancer ([place]) 1999  . Cataract 2017   resolved with surgery  . COPD (chronic obstructive pulmonary disease) ([place])   . Coronary artery disease   . GERD (gastroesophageal reflux disease)   . Hyperlipidemia   . Postoperative atrial fibrillation ([place]) 03/2018  . Rheumatoid arthritis(714.0)   . Squamous cell carcinoma of lung, stage I, right ([place]) 2020   Status post right upper and middle lobectomy    ALLERGIES:  is allergic to adhesive [tape]; neosporin [neomycin-bacitracin zn-polymyx]; and penicillins.  MEDICATIONS:  Current Outpatient Medications  Medication Sig Dispense Refill  . ADVAIR DISKUS 250-50 MCG/DOSE AEPB INHALE 1 PUFF BY MOUTH EVERY 12 HOURS 180 each 3  . aspirin EC 81 MG tablet Take 1 tablet (81 mg total) by mouth daily. 90 tablet 3   . atorvastatin (LIPITOR) 10 MG tablet Take 1 tablet (10 mg total) by mouth daily. 90 tablet 3  . cholecalciferol (VITAMIN D3) 25 MCG (1000 UT) tablet Take 1,000 Units by mouth daily.    . meloxicam (MOBIC) 15 MG tablet Take 1 tablet (15 mg total) by mouth daily. 15 tablet 0  . VENTOLIN HFA 108 (90 Base) MCG/ACT inhaler TAKE 2 PUFFS BY MOUTH EVERY 6 HOURS AS NEEDED FOR WHEEZE OR SHORTNESS OF BREATH 18 Inhaler 3   No current facility-administered medications for this visit.     SURGICAL HISTORY:  Past Surgical History:  Procedure Laterality Date  . BREAST LUMPECTOMY  1999   left, needed radiation  . CATARACT EXTRACTION W/ INTRAOCULAR LENS  IMPLANT, BILATERAL Bilateral 05/19/15, 06/02/15  . CHEST TUBE INSERTION Right 04/23/2018   Procedure: Right Lateral Chest Tube Insertion;  Surgeon: Melrose Nakayama, MD;  Location: [facility];  Service: Thoracic;  Laterality: Right;  . CHOLECYSTECTOMY    . LOBECTOMY Right 04/23/2018   Procedure: RIGHT UPPER LOBECTOMY;  Surgeon: Melrose Nakayama, MD;  Location: [facility];  Service: Thoracic;  Laterality: Right;  . NM MYOVIEW LTD  01/2016   LOW RISK. Hypertensive response to exercise followed by significant drop (stage I pressure 211/70 drop to 118/75. Stage II). Apical artifact but otherwise no ischemia or infarction. EF 64%.  Marland Kitchen NODE DISSECTION Right 04/23/2018   Procedure: MEDIASTINAL NODE DISSECTION;  Surgeon: Melrose Nakayama, MD;  Location: [facility];  Service: Thoracic;  Laterality: Right;  .  RIGHT/LEFT HEART CATH AND CORONARY ANGIOGRAPHY N/A 04/10/2018   Procedure: RIGHT/LEFT HEART CATH AND CORONARY ANGIOGRAPHY;  Surgeon: Leonie Man, MD;  Location: [facility];; angiographically minimal coronary artery disease.  Normal LVEDP.  Basely normal RHC pressures with no suggestion of pulmonary hypertension.  Normal CO-CI by Fick (5.9-2.8)  . TONSILLECTOMY    . TRANSTHORACIC ECHOCARDIOGRAM  11/'17; 2/'20   a) Nov 2017: Mild LVH. EF 60-65%. GR 2-DD;  b) Feb 2020: Normal LV size and function.  EF 60 to 65%.  No R WMA.  GR 1 DD.  Normal RV size and function.  Normal pressures.  No valvular disease.  Marland Kitchen VIDEO ASSISTED THORACOSCOPY (VATS)/WEDGE RESECTION Right 04/23/2018   Procedure: VIDEO ASSISTED THORACOSCOPY (VATS)/WEDGE RESECTION OF RIGHT UPPER LUNG;  Surgeon: Melrose Nakayama, MD;  Location[facility];  Service: Thoracic;  Laterality: Right;    REVIEW OF SYSTEMS:  A comprehensive review of systems was negative except for: Respiratory: positive for dyspnea on exertion   PHYSICAL EXAMINATION: General appearance: alert, cooperative and no distress Head: Normocephalic, without obvious abnormality, atraumatic Neck: no adenopathy, no JVD, supple, symmetrical, trachea midline and thyroid not enlarged, symmetric, no tenderness/mass/nodules Lymph nodes: Cervical, supraclavicular, and axillary nodes normal. Resp: clear to auscultation bilaterally Back: symmetric, no curvature. ROM normal. No CVA tenderness. Cardio: regular rate and rhythm, S1, S2 normal, no murmur, click, rub or gallop GI: soft, non-tender; bowel sounds normal; no masses,  no organomegaly Extremities: extremities normal, atraumatic, no cyanosis or edema  ECOG PERFORMANCE STATUS: 1 - Symptomatic but completely ambulatory  Blood pressure 132/60, pulse 88, temperature 98.7 F (37.1 C), temperature source Temporal, resp. rate 18, height 5' 5.5" (1.664 m), weight 227 lb (103 kg), SpO2 97 %.  LABORATORY DATA: Lab Results  Component Value Date   WBC 9.1 12/25/2018   HGB 14.7 12/25/2018   HCT 44.4 12/25/2018   MCV 93.7 12/25/2018   PLT 365 12/25/2018      Chemistry      Component Value Date/Time   NA 141 12/25/2018 1335   NA 140 04/04/2018 1643   K 4.8 12/25/2018 1335   CL 102 12/25/2018 1335   CO2 29 12/25/2018 1335   BUN 19 12/25/2018 1335   BUN 13 04/04/2018 1643   CREATININE 0.77 12/25/2018 1335   CREATININE 0.61 06/01/2012 1452      Component Value Date/Time    CALCIUM 9.7 12/25/2018 1335   ALKPHOS 80 12/25/2018 1335   AST 18 12/25/2018 1335   ALT 21 12/25/2018 1335   BILITOT 0.4 12/25/2018 1335       RADIOGRAPHIC STUDIES: Ct Chest W Contrast  Result Date: 12/25/2018 CLINICAL DATA:  Non-small cell lung cancer, for staging EXAM: CT CHEST WITH CONTRAST TECHNIQUE: Multidetector CT imaging of the chest was performed during intravenous contrast administration. CONTRAST:  50mL OMNIPAQUE IOHEXOL 300 MG/ML  SOLN COMPARISON:  PET-CT dated 03/09/2018.  CT chest dated 03/01/2008. FINDINGS: Cardiovascular: Heart is normal in size.  No pericardial effusion. No evidence of thoracic aortic aneurysm. Atherosclerotic calcifications of the aortic arch. Coronary atherosclerosis of the LAD. Mediastinum/Nodes: No suspicious mediastinal lymphadenopathy. 18 mm enhancing posterior right thyroid nodule (series 2/image 19), likely grossly unchanged, non FDG avid on prior PET. Lungs/Pleura: Status post right upper and middle lobectomy. No suspicious pulmonary nodules. Mild centrilobular and paraseptal emphysematous changes, left upper lobe predominant. No focal consolidation. No pleural effusion or pneumothorax. Upper Abdomen: Visualized upper abdomen is notable for a moderate hiatal hernia, vascular calcifications, and left  colonic diverticulosis. Musculoskeletal: Visualized osseous structures are within normal limits. IMPRESSION: Status post right upper and middle lobectomy. No evidence of recurrent or metastatic disease. 18 mm posterior right thyroid nodule, grossly unchanged. Consider thyroid ultrasound for further evaluation as clinically warranted. Aortic Atherosclerosis (ICD10-I70.0) and Emphysema (ICD10-J43.9). Electronically Signed   By: Julian Hy M.D.   On: 12/25/2018 22:44    ASSESSMENT AND PLAN: This is a very pleasant 73 years old white female with a stage Ib non-small cell lung cancer status post right upper and middle lobectomies with lymph node dissection in  February 2020.  The patient is currently on observation and she is feeling fine today with no concerning complaints. She had repeat CT scan of the chest performed recently. I personally and independently reviewed the scans and discussed the results with the patient today. I recommended for her to continue on observation with repeat CT scan of the chest in 6 months. The patient was advised to call immediately if she has any concerning symptoms in the interval. The patient voices understanding of current disease status and treatment options and is in agreement with the current care plan.  All questions were answered. The patient knows to call the clinic with any problems, questions or concerns. We can certainly see the patient much sooner if necessary.  I spent 10 minutes counseling the patient face to face. The total time spent in the appointment was 15 minutes.  Disclaimer: This note was dictated with voice recognition software. Similar sounding words can inadvertently be transcribed and may not be corrected upon review.

## 2019-01-07 NOTE — Telephone Encounter (Signed)
Scheduled per 11/09 los, patient received after visit summary and calender.

## 2019-01-14 ENCOUNTER — Other Ambulatory Visit: Payer: Self-pay

## 2019-01-14 ENCOUNTER — Ambulatory Visit: Payer: Medicare HMO | Admitting: Cardiology

## 2019-01-14 VITALS — BP 145/61 | HR 66 | Temp 97.1°F | Ht 67.0 in | Wt 228.0 lb

## 2019-01-14 DIAGNOSIS — R0600 Dyspnea, unspecified: Secondary | ICD-10-CM | POA: Diagnosis not present

## 2019-01-14 DIAGNOSIS — R0609 Other forms of dyspnea: Secondary | ICD-10-CM

## 2019-01-14 DIAGNOSIS — I5189 Other ill-defined heart diseases: Secondary | ICD-10-CM

## 2019-01-14 DIAGNOSIS — E785 Hyperlipidemia, unspecified: Secondary | ICD-10-CM | POA: Diagnosis not present

## 2019-01-14 NOTE — Progress Notes (Signed)
Primary Care Provider: Jinny Sanders, MD Cardiologist: Glenetta Hew, MD Electrophysiologist:   Clinic Note: Chief Complaint  Patient presents with  . Follow-up    59-month; no significant symptoms    HPI:    Hannah Green is a 73 y.o. female with a PMH below who presents today for delayed 31-month follow-up.  Hannah Green was last seen on 04/16/2018 post-cath (referred for CP & DOE-initially as part of preoperative evaluation for right-sided VATS surgery with Dr. Koleen Nimrod with possible lobectomy - Stage 1A SCCa - RUL) --> normal Echo & R&:HC. Was having wrist pain post cath.   Recent Hospitalizations:   2/24-05/06/18/2020: VATS guided RU&M Lobectomy & Mediastinal LN disection -- (confrimed Non-Small Cell Ca)  Complicated by post-op Afib - [employer] CV. --> restarted BB& HCTZ.  Persistent Air Leak - prolonged Chest Tube-->  Chest tube insertion site cellulitis-treated with Levaquin on discharge.  05/08/11/2020: Noted persistent pain and erythema around chest tube insertion site.  Noted low-grade fevers.  Concern for right lower lobe pneumonia despite Levaquin.  Treated with IV antibiotics via PICC line until March 20.   Reviewed  CV studies:    The following studies were reviewed today: (if available, images/films reviewed: From Epic Chart or Care Everywhere) . None:   Interval History:   Hannah Green returns here today stating that she is doing relatively well from a cardiac standpoint.  Procedures notes that ever since her surgery she has been noticing more pronounced dyspnea.  She says that she just has not felt well since her surgery.  She is not able to exercise and is therefore gained weight.  2 reasons for having exercise one being the COVID-19 restrictions limiting access to the gym but also because of dyspnea.  She just completely out of breath.  No complaints of chest pain or pressure.  Just out of breath without even exerting herself.  The only  thing from a cardiac standpoint she notes is occasional palpitations off and on but nothing really overly worrisome to her.  They last less than a minute and can occur both at rest and with walking around.  Most of the time these resolve with vagal type maneuvers.  No syncope or near syncope type symptoms.   CV Review of Symptoms (Summary) Cardiovascular ROS: positive for - dyspnea on exertion, shortness of breath and Rare palpitations negative for - chest pain, edema, orthopnea, paroxysmal nocturnal dyspnea, rapid heart rate or TIA/amaurosis fugax, syncope/near syncope, claudication.  The patient does not have symptoms concerning for COVID-19 infection (fever, chills, cough, or new shortness of breath).  The patient is practicing social distancing. ++ Masking.  She does not go out for groceries/shopping.    REVIEWED OF SYSTEMS   A comprehensive ROS was performed.  Pertinent symptoms noted above. Review of Systems  Constitutional: Positive for malaise/fatigue (Mostly because she is short of breath since surgery).  HENT: Negative for nosebleeds.   Respiratory: Positive for cough (Off and on) and shortness of breath.   Gastrointestinal: Negative for abdominal pain, blood in stool and melena.  Genitourinary: Negative for hematuria.  Neurological: Positive for dizziness (Sometimes if she stands up too quickly, has been much better of late.). Negative for focal weakness and headaches.  Psychiatric/Behavioral: Negative for depression (Not really depression, but does seem down). The patient is nervous/anxious.    I have reviewed and (if needed) personally updated the patient's problem list, medications, allergies, past medical and surgical history, social and family history.  PAST MEDICAL HISTORY   Past Medical History:  Diagnosis Date  . Breast cancer ([place]) 1999  . Cataract 2017   resolved with surgery  . COPD (chronic obstructive pulmonary disease) ([place])   . Coronary artery disease   .  GERD (gastroesophageal reflux disease)   . Hyperlipidemia   . Postoperative atrial fibrillation ([place]) 03/2018  . Rheumatoid arthritis(714.0)   . Squamous cell carcinoma of lung, stage I, right ([place]) 2020   Status post right upper and middle lobectomy     PAST SURGICAL HISTORY   Past Surgical History:  Procedure Laterality Date  . BREAST LUMPECTOMY  1999   left, needed radiation  . CATARACT EXTRACTION W/ INTRAOCULAR LENS  IMPLANT, BILATERAL Bilateral 05/19/15, 06/02/15  . CHEST TUBE INSERTION Right 04/23/2018   Procedure: Right Lateral Chest Tube Insertion;  Surgeon: Melrose Nakayama, MD;  Location: [facility];  Service: Thoracic;  Laterality: Right;  . CHOLECYSTECTOMY    . LOBECTOMY Right 04/23/2018   Procedure: RIGHT UPPER LOBECTOMY;  Surgeon: Melrose Nakayama, MD;  Location: [facility];  Service: Thoracic;  Laterality: Right;  . NM MYOVIEW LTD  01/2016   LOW RISK. Hypertensive response to exercise followed by significant drop (stage I pressure 211/70 drop to 118/75. Stage II). Apical artifact but otherwise no ischemia or infarction. EF 64%.  Marland Kitchen NODE DISSECTION Right 04/23/2018   Procedure: MEDIASTINAL NODE DISSECTION;  Surgeon: Melrose Nakayama, MD;  Location: [facility];  Service: Thoracic;  Laterality: Right;  . RIGHT/LEFT HEART CATH AND CORONARY ANGIOGRAPHY N/A 04/10/2018   Procedure: RIGHT/LEFT HEART CATH AND CORONARY ANGIOGRAPHY;  Surgeon: Leonie Man, MD;  Location: [facility];; angiographically minimal coronary artery disease.  Normal LVEDP.  Basely normal RHC pressures with no suggestion of pulmonary hypertension.  Normal CO-CI by Fick (5.9-2.8)  . TONSILLECTOMY    . TRANSTHORACIC ECHOCARDIOGRAM  11/'17; 2/'20   a) Nov 2017: Mild LVH. EF 60-65%. GR 2-DD; b) Feb 2020: Normal LV size and function.  EF 60 to 65%.  No R WMA.  GR 1 DD.  Normal RV size and function.  Normal pressures.  No valvular disease.  Marland Kitchen VIDEO ASSISTED THORACOSCOPY (VATS)/WEDGE RESECTION Right 04/23/2018    Procedure: VIDEO ASSISTED THORACOSCOPY (VATS)/WEDGE RESECTION OF RIGHT UPPER LUNG;  Surgeon: Melrose Nakayama, MD;  [facility];  Service: Thoracic;  Laterality: Right;     MEDICATIONS/ALLERGIES   Current Meds  Medication Sig  . ADVAIR DISKUS 250-50 MCG/DOSE AEPB INHALE 1 PUFF BY MOUTH EVERY 12 HOURS  . aspirin EC 81 MG tablet Take 1 tablet (81 mg total) by mouth daily.  Marland Kitchen atorvastatin (LIPITOR) 10 MG tablet Take 1 tablet (10 mg total) by mouth daily.  . cholecalciferol (VITAMIN D3) 25 MCG (1000 UT) tablet Take 1,000 Units by mouth daily.  . meloxicam (MOBIC) 15 MG tablet Take 1 tablet (15 mg total) by mouth daily.  . VENTOLIN HFA 108 (90 Base) MCG/ACT inhaler TAKE 2 PUFFS BY MOUTH EVERY 6 HOURS AS NEEDED FOR WHEEZE OR SHORTNESS OF BREATH    Allergies  Allergen Reactions  . Adhesive [Tape] Rash    Paper tape causes rash  . Neosporin [Neomycin-Bacitracin Zn-Polymyx] Itching  . Penicillins Rash    Did it involve swelling of the face/tongue/throat, SOB, or low BP? No Did it involve sudden or severe rash/hives, skin peeling, or any reaction on the inside of your mouth or nose? Yes Did you need to seek medical attention at a hospital or doctor's office?  Yes When did it last happen? From childhood If all above answers are "NO", may proceed with cephalosporin use.      SOCIAL HISTORY/FAMILY HISTORY   Social History   Tobacco Use  . Smoking status: Former Smoker    Packs/day: 1.50    Years: 50.00    Pack years: 75.00    Types: Cigarettes    Quit date: 04/01/2011    Years since quitting: 7.8  . Smokeless tobacco: Never Used  . Tobacco comment: has stopped but has started again in "crisis"  Substance Use Topics  . Alcohol use: Yes    Alcohol/week: 2.0 standard drinks    Types: 2 Glasses of wine per week  . Drug use: No   Social History   Social History Narrative   Lives alone   No living will   Requests friend Porfirio Mylar to make health care decisions.   Would  accept resuscitation but no prolonged ventilation   Would not want tube feeds if cognitively unaware    Family History family history includes Cancer in her father, mother, and sister; Diabetes in her maternal grandmother and paternal uncle.   OBJCTIVE -PE, EKG, labs   Wt Readings from Last 3 Encounters:  01/14/19 228 lb (103.4 kg)  01/07/19 227 lb (103 kg)  12/25/18 223 lb 12 oz (101.5 kg)    Physical Exam: BP (!) 145/61   Pulse 66   Temp (!) 97.1 F (36.2 C)   Ht 5\' 7"  (1.702 m)   Wt 228 lb (103.4 kg)   LMP  (LMP Unknown)   SpO2 98%   BMI 35.71 kg/m  Physical Exam  Constitutional: She appears well-developed and well-nourished. No distress.  Healthy-appearing, but now more notably obese  HENT:  Head: Normocephalic and atraumatic.  Neck: Normal range of motion. Neck supple. No hepatojugular reflux and no JVD present. Carotid bruit is not present. No thyromegaly present.  Cardiovascular: Normal rate, regular rhythm, normal heart sounds and intact distal pulses.  No extrasystoles are present. PMI is not displaced. Exam reveals no gallop and no friction rub.  No murmur heard. Pulmonary/Chest: Effort normal and breath sounds normal. No respiratory distress. She has no wheezes. She has no rales.  Abdominal: Soft. Bowel sounds are normal. She exhibits no distension. There is no abdominal tenderness. There is no rebound.  Vitals reviewed.   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  Blood pressure is little bit high.  Postoperatively it has been placed on beta-blocker and ACE inhibitor which are not currently listed.  Low threshold to consider restarting her baseline dose of medications.  At present with her not having much energy and feeling dyspneic, would probably hold off, but I suspect that low-dose beta-blocker will help  her palpitations, and would potentially consider ARB as opposed to ACE inhibitor that would help afterload reduction.       Relevant Orders   EKG 12-Lead (Completed)   DOE (dyspnea on exertion) (Chronic)    Seems like her dyspnea is worse since her surgery.  Echocardiogram suggested maybe some diastolic dysfunction, right heart cath pressures did not show any evidence of pulmonary hypertension. Relatively normal coronary arteries on cath.  Would look for baseline pulmonary etiology.      Hyperlipidemia with target LDL less than 100 (Chronic)    With evidence of coronary artery calcification and atherosclerosis despite nonobstructive disease noted on cath, she still needs to be adequately controlled with an LDL less than 100.  PCP restarted atorvastatin upon last lipid check.  Low threshold to consider titration up of dose or conversion to rosuvastatin if not adequately controlled and follow-up labs.  We will defer management to PCP.      Relevant Orders   EKG 12-Lead (Completed)       COVID-19 Education: The signs and symptoms of COVID-19 were discussed with the patient and how to seek care for testing (follow up with PCP or arrange E-visit).   The importance of social distancing was discussed today.  I spent a total of 93minutes with the patient and chart review. >  50% of the time was spent in direct patient consultation.  Additional time spent with chart review (studies, outside notes, etc): 5 Total  Time: 20 min   Current medicines are reviewed at length with the patient today.  (+/- concerns) n/a   Patient Instructions / Medication Changes & Studies & Tests Ordered   Patient Instructions  Medication Instructions:  No changes    Lab Work: Not needed  Testing/Procedures: Not needed  Follow-Up: At [facility], you and your health needs are our priority.  As part of our continuing mission to provide you with exceptional heart care, we have created designated Provider Care Teams.  These Care Teams include your primary Cardiologist (physician) and Advanced Practice Providers (APPs -  Physician Assistants and Nurse Practitioners) who all work together to provide you with the care you need, when you need it.  Your next appointment:   12 months  The format for your next appointment:   In Person  Provider:   Glenetta Hew, MD  Other Instructions    Studies Ordered:   Orders Placed This Encounter  Procedures  . EKG 12-Lead     Glenetta Hew, M.D., M.S. Interventional Cardiologist   Pager # [phone] Phone # [phone] [address] [place], [place] [address]   Thank you for choosing [facility]care at [facility]!!

## 2019-01-14 NOTE — Patient Instructions (Signed)
Medication Instructions:  No changes    Lab Work: Not needed  Testing/Procedures: Not needed  Follow-Up: At [facility], you and your health needs are our priority.  As part of our continuing mission to provide you with exceptional heart care, we have created designated Provider Care Teams.  These Care Teams include your primary Cardiologist (physician) and Advanced Practice Providers (APPs -  Physician Assistants and Nurse Practitioners) who all work together to provide you with the care you need, when you need it.  Your next appointment:   12 months  The format for your next appointment:   In Person  Provider:   Glenetta Hew, MD  Other Instructions

## 2019-01-16 ENCOUNTER — Encounter: Payer: Self-pay | Admitting: Cardiology

## 2019-01-16 NOTE — Assessment & Plan Note (Signed)
Seems euvolemic.  Blood pressure is little bit high.  Postoperatively it has been placed on beta-blocker and ACE inhibitor which are not currently listed.  Low threshold to consider restarting her baseline dose of medications.  At present with her not having much energy and feeling dyspneic, would probably hold off, but I suspect that low-dose beta-blocker will help her palpitations, and would potentially consider ARB as opposed to ACE inhibitor that would help afterload reduction.

## 2019-01-16 NOTE — Assessment & Plan Note (Signed)
Seems like her dyspnea is worse since her surgery.  Echocardiogram suggested maybe some diastolic dysfunction, right heart cath pressures did not show any evidence of pulmonary hypertension. Relatively normal coronary arteries on cath.  Would look for baseline pulmonary etiology.

## 2019-01-16 NOTE — Assessment & Plan Note (Addendum)
With evidence of coronary artery calcification and atherosclerosis despite nonobstructive disease noted on cath, she still needs to be adequately controlled with an LDL less than 100.  PCP restarted atorvastatin upon last lipid check.  Low threshold to consider titration up of dose or conversion to rosuvastatin if not adequately controlled and follow-up labs.  We will defer management to PCP.

## 2019-02-15 ENCOUNTER — Other Ambulatory Visit: Payer: Self-pay

## 2019-02-15 ENCOUNTER — Ambulatory Visit
Admission: EM | Admit: 2019-02-15 | Discharge: 2019-02-15 | Disposition: A | Discharge status: Home / Self Care | Payer: Medicare HMO | Attending: Nurse Practitioner | Admitting: Nurse Practitioner

## 2019-02-15 ENCOUNTER — Encounter: Payer: Self-pay | Admitting: Emergency Medicine

## 2019-02-15 ENCOUNTER — Ambulatory Visit: Payer: Medicare HMO | Admitting: Family Medicine

## 2019-02-15 DIAGNOSIS — Z20822 Contact with and (suspected) exposure to covid-19: Secondary | ICD-10-CM

## 2019-02-15 DIAGNOSIS — J0100 Acute maxillary sinusitis, unspecified: Secondary | ICD-10-CM | POA: Diagnosis not present

## 2019-02-15 DIAGNOSIS — Z20828 Contact with and (suspected) exposure to other viral communicable diseases: Secondary | ICD-10-CM

## 2019-02-15 DIAGNOSIS — Z7689 Persons encountering health services in other specified circumstances: Secondary | ICD-10-CM | POA: Diagnosis not present

## 2019-02-15 MED ORDER — METHYLPREDNISOLONE 4 MG PO TBPK: ORAL_TABLET | ORAL | 0 refills | Status: DC

## 2019-02-15 MED ORDER — PROMETHAZINE-DM 6.25-15 MG/5ML PO SYRP: 5.0000 mL | ORAL_SOLUTION | Freq: Four times a day (QID) | ORAL | 0 refills | Status: DC | PRN

## 2019-02-15 MED ORDER — DOXYCYCLINE HYCLATE 100 MG PO CAPS: 100.0000 mg | ORAL_CAPSULE | Freq: Two times a day (BID) | ORAL | 0 refills | Status: DC

## 2019-02-15 MED ORDER — FLUTICASONE PROPIONATE 50 MCG/ACT NA SUSP: 2.0000 | Freq: Every day | NASAL | 0 refills | Status: DC

## 2019-02-15 NOTE — ED Triage Notes (Signed)
Patient in office concerned that she might get pneumonia due to have lung issues. Has having draining, cough last wk  Denies: fever,sorethroat [id number]

## 2019-02-15 NOTE — ED Provider Notes (Signed)
Hannah Green    CSN: [id number] Arrival date & time: 02/15/19  1004      History   Chief Complaint Chief Complaint  Patient presents with  . cold symptoms    HPI Hannah Green is a 73 y.o. female.   Subjective:   Hannah Green is a 73 y.o. female with a history of non-small cell lung cancer status post right lobectomy, COPD and other comorbidities presents for evaluation of possible sinus infection. Symptoms include right ear pressure/pain, congestion, coryza, shortness of breath, wheezing, nasal congestion, post nasal drip and sinus pressure with no fever, chills, night sweats or weight loss. She also denies any body aches, sore throat, nausea, vomiting, diarrhea, headache, dizziness or change in taste/smell.  Onset of symptoms was more than 1 week ago and has been gradually worsening since that time.  She is drinking plenty of fluids and has only tried Tylenol for her symptoms.  She denies any known exposure to COVID-19.  Patient is a non-smoker.  Patient has significant risk factors for UUVOZ-36 complications.  The following portions of the patient's history were reviewed and updated as appropriate: allergies, current medications, past family history, past medical history, past social history, past surgical history and problem list.       Past Medical History:  Diagnosis Date  . Breast cancer ([place]) 1999  . Cataract 2017   resolved with surgery  . COPD (chronic obstructive pulmonary disease) ([place])   . Coronary artery disease   . GERD (gastroesophageal reflux disease)   . Hyperlipidemia   . Postoperative atrial fibrillation ([place]) 03/2018  . Rheumatoid arthritis(714.0)   . Squamous cell carcinoma of lung, stage I, right ([place]) 2020   Status post right upper and middle lobectomy    Patient Active Problem List   Diagnosis Date Noted  . Acute neck pain 12/25/2018  . Goals of care, counseling/discussion 06/12/2018  . Stage I squamous cell carcinoma of right  lung ([place]) 06/12/2018  . HCAP (healthcare-associated pneumonia) 05/08/2018  . Non-small cell lung cancer ([place]) 04/24/2018  . S/P Right Upper Lobectomy, Right Middle Lobectomy Lung 04/23/2018  . Pain and swelling of wrist, right 04/18/2018  . Exertional chest pain 04/04/2018  . DOE (dyspnea on exertion) 04/04/2018  . Pulmonary nodule 03/05/2018  . Prediabetes 01/08/2018  . COPD exacerbation ([place]) 04/27/2017  . COPD (chronic obstructive pulmonary disease) ([place]) 12/07/2016  . 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 Osteopenia of femoral neck 01/12/2016  . Rosacea 12/22/2015  . Constipation, chronic 11/11/2014  . Basal cell carcinoma of left side of nose 06/01/2012  . Routine general medical examination at a health care facility 06/02/2011  . GERD (gastroesophageal reflux disease)   . Vitamin D deficiency 02/13/2008  . ADENOCARCINOMA, BREAST 02/08/2008  . Hyperlipidemia with target LDL less than 100 02/08/2008  . RHEUMATOID ARTHRITIS 02/08/2008    Past Surgical History:  Procedure Laterality Date  . BREAST LUMPECTOMY  1999   left, needed radiation  . CATARACT EXTRACTION W/ INTRAOCULAR LENS  IMPLANT, BILATERAL Bilateral 05/19/15, 06/02/15  . CHEST TUBE INSERTION Right 04/23/2018   Procedure: Right Lateral Chest Tube Insertion;  Surgeon: Melrose Nakayama, MD;  Location: [facility];  Service: Thoracic;  Laterality: Right;  . CHOLECYSTECTOMY    . LOBECTOMY Right 04/23/2018   Procedure: RIGHT UPPER LOBECTOMY;  Surgeon: Melrose Nakayama, MD;  Location: [facility];  Service: Thoracic;  Laterality: Right;  . NM MYOVIEW LTD  01/2016   LOW RISK. Hypertensive response  to exercise followed by significant drop (stage I pressure 211/70 drop to 118/75. Stage II). Apical artifact but otherwise no ischemia or infarction. EF 64%.  Marland Kitchen NODE DISSECTION Right 04/23/2018   Procedure: MEDIASTINAL NODE DISSECTION;  Surgeon: Melrose Nakayama, MD;  Location: [facility];  Service: Thoracic;   Laterality: Right;  . RIGHT/LEFT HEART CATH AND CORONARY ANGIOGRAPHY N/A 04/10/2018   Procedure: RIGHT/LEFT HEART CATH AND CORONARY ANGIOGRAPHY;  Surgeon: Leonie Man, MD;  Location: [facility];; angiographically minimal coronary artery disease.  Normal LVEDP.  Basely normal RHC pressures with no suggestion of pulmonary hypertension.  Normal CO-CI by Fick (5.9-2.8)  . TONSILLECTOMY    . TRANSTHORACIC ECHOCARDIOGRAM  11/'17; 2/'20   a) Nov 2017: Mild LVH. EF 60-65%. GR 2-DD; b) Feb 2020: Normal LV size and function.  EF 60 to 65%.  No R WMA.  GR 1 DD.  Normal RV size and function.  Normal pressures.  No valvular disease.  Marland Kitchen VIDEO ASSISTED THORACOSCOPY (VATS)/WEDGE RESECTION Right 04/23/2018   Procedure: VIDEO ASSISTED THORACOSCOPY (VATS)/WEDGE RESECTION OF RIGHT UPPER LUNG;  Surgeon: Melrose Nakayama, MD;  Location: [facility];  Service: Thoracic;  Laterality: Right;    OB History   No obstetric history on file.      Home Medications    Prior to Admission medications   Medication Sig Start Date End Date Taking? Authorizing Provider  ADVAIR DISKUS 250-50 MCG/DOSE AEPB INHALE 1 PUFF BY MOUTH EVERY 12 HOURS 12/26/18   Icard, Octavio Graves, DO  aspirin EC 81 MG tablet Take 1 tablet (81 mg total) by mouth daily. 04/04/18   Leonie Man, MD  atorvastatin (LIPITOR) 10 MG tablet Take 1 tablet (10 mg total) by mouth daily. 12/25/18   Bedsole, Amy E, MD  cholecalciferol (VITAMIN D3) 25 MCG (1000 UT) tablet Take 1,000 Units by mouth daily.    [provider]  doxycycline (VIBRAMYCIN) 100 MG capsule Take 1 capsule (100 mg total) by mouth 2 (two) times daily. 02/15/19   Enrique Sack, FNP  fluticasone (FLONASE) 50 MCG/ACT nasal spray Place 2 sprays into both nostrils daily. 02/15/19   Enrique Sack, FNP  meloxicam (MOBIC) 15 MG tablet Take 1 tablet (15 mg total) by mouth daily. 12/25/18   Jinny Sanders, MD  methylPREDNISolone (MEDROL DOSEPAK) 4 MG TBPK tablet Take as  prescribed 02/15/19   Enrique Sack, FNP  promethazine-dextromethorphan (PROMETHAZINE-DM) 6.25-15 MG/5ML syrup Take 5 mLs by mouth 4 (four) times daily as needed for cough. 02/15/19   Enrique Sack, FNP  VENTOLIN HFA 108 (90 Base) MCG/ACT inhaler TAKE 2 PUFFS BY MOUTH EVERY 6 HOURS AS NEEDED FOR WHEEZE OR SHORTNESS OF BREATH 12/12/17   Jinny Sanders, MD    Family History Family History  Problem Relation Age of Onset  . Cancer Mother        breast  . Cancer Father        lung cancer  . Cancer Sister        throat cancer  . Diabetes Paternal Uncle   . Diabetes Maternal Grandmother     Social History Social History   Tobacco Use  . Smoking status: Former Smoker    Packs/day: 1.50    Years: 50.00    Pack years: 75.00    Types: Cigarettes    Quit date: 04/01/2011    Years since quitting: 7.8  . Smokeless tobacco: Never Used  . Tobacco comment: has stopped but has started again in "crisis"  Substance Use Topics  . Alcohol use: Yes    Alcohol/week: 2.0 standard drinks    Types: 2 Glasses of wine per week  . Drug use: No     Allergies   Adhesive [tape], Neosporin [neomycin-bacitracin zn-polymyx], and Penicillins   Review of Systems Review of Systems  Constitutional: Negative for chills, diaphoresis and fever.  HENT: Positive for congestion, ear pain, postnasal drip, rhinorrhea and sinus pressure. Negative for sore throat.   Respiratory: Positive for cough, shortness of breath and wheezing.   Cardiovascular: Negative for chest pain and palpitations.  Gastrointestinal: Negative for diarrhea, nausea and vomiting.  Musculoskeletal: Negative for myalgias.  Neurological: Negative for dizziness and headaches.  All other systems reviewed and are negative.    Physical Exam Triage Vital Signs ED Triage Vitals  Enc Vitals Group     BP 02/15/19 1026 140/80     Pulse Rate 02/15/19 1016 73     Resp 02/15/19 1016 18     Temp 02/15/19 1016 98.5 F (36.9 C)     Temp src  --      SpO2 02/15/19 1016 96 %     Weight 02/15/19 1014 217 lb (98.4 kg)     Height --      Head Circumference --      Peak Flow --      Pain Score --      Pain Loc --      Pain Edu? --      Excl. in [place]? --    No data found.  Updated Vital Signs BP 140/80 (BP Location: Right Arm)   Pulse 73   Temp 98.5 F (36.9 C)   Resp 18   Wt 217 lb (98.4 kg)   LMP  (LMP Unknown)   SpO2 96%   BMI 33.99 kg/m   Visual Acuity Right Eye Distance:   Left Eye Distance:   Bilateral Distance:    Right Eye Near:   Left Eye Near:    Bilateral Near:     Physical Exam Vitals reviewed.  Constitutional:      Appearance: Normal appearance. She is not toxic-appearing.  HENT:     Head: Normocephalic.     Right Ear: Tympanic membrane, ear canal and external ear normal. There is impacted cerumen.     Left Ear: Tympanic membrane, ear canal and external ear normal.     Nose:     Right Sinus: Maxillary sinus tenderness present.     Left Sinus: Maxillary sinus tenderness present.     Mouth/Throat:     Mouth: Mucous membranes are moist.  Eyes:     Extraocular Movements: Extraocular movements intact.     Conjunctiva/sclera: Conjunctivae normal.     Pupils: Pupils are equal, round, and reactive to light.  Cardiovascular:     Rate and Rhythm: Normal rate and regular rhythm.  Pulmonary:     Effort: Pulmonary effort is normal. No respiratory distress.     Breath sounds: No stridor. No wheezing, rhonchi or rales.  Musculoskeletal:        General: Normal range of motion.     Cervical back: Normal range of motion and neck supple.  Lymphadenopathy:     Cervical: No cervical adenopathy.  Skin:    General: Skin is warm and dry.  Neurological:     General: No focal deficit present.     Mental Status: She is alert and oriented to person, place, and time.      UC Treatments /  Results  Labs (all labs ordered are listed, but only abnormal results are displayed) Labs Reviewed  NOVEL CORONAVIRUS,  NAA    EKG   Radiology No results found.  Procedures Procedures (including critical care time)  Medications Ordered in UC Medications - No data to display  Initial Impression / Assessment and Plan / UC Course  I have reviewed the triage vital signs and the nursing notes.  Pertinent labs & imaging results that were available during my care of the patient were reviewed by me and considered in my medical decision making (see chart for details).    73 year old female with a history of non-small cell lung cancer status post right lobectomy, COPD and multiple other comorbidities presents with a 1 week history of right ear pressure, congestion, cough, shortness of breath, wheezing, nasal congestion, postnasal drip and sinus pressure.  Patient is afebrile.  Nontoxic-appearing.  Diminished lung sounds on the right.  Otherwise normal physical exam.  COVID-19 PCR testing pending.  Antibiotics, steroids, decongestants and nasal rinses.  Follow-up with PCP if symptoms persist.  Go to ED if symptoms is worse.  Today's evaluation has revealed no signs of a dangerous process. Discussed diagnosis with patient and/or guardian. Patient and/or guardian aware of their diagnosis, possible red flag symptoms to watch out for and need for close follow up. Patient and/or guardian understands verbal and written discharge instructions. Patient and/or guardian comfortable with plan and disposition.  Patient and/or guardian has a clear mental status at this time, good insight into illness (after discussion and teaching) and has clear judgment to make decisions regarding their care  This care was provided during an unprecedented National Emergency due to the Novel Coronavirus (COVID-19) pandemic. COVID-19 infections and transmission risks place heavy strains on healthcare resources.  As this pandemic evolves, our facility, providers, and staff strive to respond fluidly, to remain operational, and to provide care relative  to available resources and information. Outcomes are unpredictable and treatments are without well-defined guidelines. Further, the impact of COVID-19 on all aspects of urgent care, including the impact to patients seeking care for reasons other than COVID-19, is unavoidable during this national emergency. At this time of the global pandemic, management of patients has significantly changed, even for non-COVID positive patients given high local and regional COVID volumes at this time requiring high healthcare system and resource utilization. The standard of care for management of both COVID suspected and non-COVID suspected patients continues to change rapidly at the local, regional, national, and global levels. This patient was worked up and treated to the best available but ever changing evidence and resources available at this current time.   Documentation was completed with the aid of voice recognition software. Transcription may contain typographical errors.  Final Clinical Impressions(s) / UC Diagnoses   Final diagnoses:  Acute non-recurrent maxillary sinusitis  Encounter for screening laboratory testing for COVID-19 virus     Discharge Instructions     Take medications as prescribed. You may take tylenol or ibuprofen as needed for fevers/headache/body aches. Drink plenty of fluids. Stay in home isolation until you receive results of your COVID test. You will only be notified for positive results. You may go online to MyChart in the next few days and review your results. Please follow CDC guidelines that are attached. You may discontinue home isolation when there has been at least 10 days since symptoms onset AND 3 days fever free without antipyretics (Tylenol or Ibuprofen) AND an overall improvement in your symptoms. Go  to the ED immediately if you get worse or have any other symptoms.   Feel better soon!  Merry Melba Coon, FNP-C      ED Prescriptions    Medication Sig  Dispense Auth. Provider   methylPREDNISolone (MEDROL DOSEPAK) 4 MG TBPK tablet Take as prescribed 21 tablet Enrique Sack, FNP   doxycycline (VIBRAMYCIN) 100 MG capsule Take 1 capsule (100 mg total) by mouth 2 (two) times daily. 20 capsule Enrique Sack, FNP   fluticasone (FLONASE) 50 MCG/ACT nasal spray Place 2 sprays into both nostrils daily. 9.9 mL Enrique Sack, FNP   promethazine-dextromethorphan (PROMETHAZINE-DM) 6.25-15 MG/5ML syrup Take 5 mLs by mouth 4 (four) times daily as needed for cough. 118 mL Enrique Sack, FNP     PDMP not reviewed this encounter.   Enrique Sack, [place] 02/15/19 1043

## 2019-02-15 NOTE — Discharge Instructions (Addendum)
Take medications as prescribed. You may take tylenol or ibuprofen as needed for fevers/headache/body aches. Drink plenty of fluids. Stay in home isolation until you receive results of your COVID test. You will only be notified for positive results. You may go online to MyChart in the next few days and review your results. Please follow CDC guidelines that are attached. You may discontinue home isolation when there has been at least 10 days since symptoms onset AND 3 days fever free without antipyretics (Tylenol or Ibuprofen) AND an overall improvement in your symptoms. Go to the ED immediately if you get worse or have any other symptoms.   Feel better soon!  Merry Melba Coon, FNP-C

## 2019-02-17 LAB — NOVEL CORONAVIRUS, NAA: SARS-CoV-2, NAA: NOT DETECTED

## 2019-04-16 ENCOUNTER — Ambulatory Visit: Payer: Medicare HMO | Admitting: Thoracic Surgery (Cardiothoracic Vascular Surgery)

## 2019-04-21 ENCOUNTER — Ambulatory Visit: Payer: Medicare HMO | Attending: Internal Medicine

## 2019-04-21 DIAGNOSIS — Z23 Encounter for immunization: Secondary | ICD-10-CM | POA: Insufficient documentation

## 2019-04-21 NOTE — Progress Notes (Signed)
   Covid-19 Vaccination Clinic  Name:  LISEL SIEGRIST    MRN: [id number] DOB: Sep 12, 1945  04/21/2019  Ms. Enerson was observed post Covid-19 immunization for 15 minutes without incidence. She was provided with Vaccine Information Sheet and instruction to access the V-Safe system.   Ms. Quigley was instructed to call 911 with any severe reactions post vaccine: Marland Kitchen Difficulty breathing  . Swelling of your face and throat  . A fast heartbeat  . A bad rash all over your body  . Dizziness and weakness    Immunizations Administered    Name Date Dose VIS Date Route   Pfizer COVID-19 Vaccine 04/21/2019 11:09 AM 0.3 mL 02/08/2019 Intramuscular   Manufacturer: [place]   Lot: [id number]   [place]: [id number]

## 2019-04-23 DIAGNOSIS — R030 Elevated blood-pressure reading, without diagnosis of hypertension: Secondary | ICD-10-CM | POA: Diagnosis not present

## 2019-04-23 DIAGNOSIS — E669 Obesity, unspecified: Secondary | ICD-10-CM | POA: Diagnosis not present

## 2019-04-23 DIAGNOSIS — K5900 Constipation, unspecified: Secondary | ICD-10-CM | POA: Diagnosis not present

## 2019-04-23 DIAGNOSIS — M069 Rheumatoid arthritis, unspecified: Secondary | ICD-10-CM | POA: Diagnosis not present

## 2019-04-23 DIAGNOSIS — C3490 Malignant neoplasm of unspecified part of unspecified bronchus or lung: Secondary | ICD-10-CM | POA: Diagnosis not present

## 2019-04-23 DIAGNOSIS — Z7982 Long term (current) use of aspirin: Secondary | ICD-10-CM | POA: Diagnosis not present

## 2019-04-23 DIAGNOSIS — Z7951 Long term (current) use of inhaled steroids: Secondary | ICD-10-CM | POA: Diagnosis not present

## 2019-04-23 DIAGNOSIS — Z87891 Personal history of nicotine dependence: Secondary | ICD-10-CM | POA: Diagnosis not present

## 2019-04-23 DIAGNOSIS — E785 Hyperlipidemia, unspecified: Secondary | ICD-10-CM | POA: Diagnosis not present

## 2019-04-23 DIAGNOSIS — Z008 Encounter for other general examination: Secondary | ICD-10-CM | POA: Diagnosis not present

## 2019-04-23 DIAGNOSIS — Z791 Long term (current) use of non-steroidal anti-inflammatories (NSAID): Secondary | ICD-10-CM | POA: Diagnosis not present

## 2019-04-29 ENCOUNTER — Other Ambulatory Visit: Payer: Self-pay | Admitting: Thoracic Surgery (Cardiothoracic Vascular Surgery)

## 2019-04-29 DIAGNOSIS — C3490 Malignant neoplasm of unspecified part of unspecified bronchus or lung: Secondary | ICD-10-CM

## 2019-04-30 ENCOUNTER — Ambulatory Visit: Payer: Medicare HMO | Admitting: Thoracic Surgery (Cardiothoracic Vascular Surgery)

## 2019-05-14 ENCOUNTER — Ambulatory Visit: Payer: Medicare HMO | Attending: Internal Medicine

## 2019-05-14 DIAGNOSIS — Z23 Encounter for immunization: Secondary | ICD-10-CM

## 2019-05-14 NOTE — Progress Notes (Signed)
   Covid-19 Vaccination Clinic  Name:  Hannah Green    MRN: [id number] DOB: 1945-06-09  05/14/2019  [name] was observed post Covid-19 immunization for 15 minutes without incident. She was provided with Vaccine Information Sheet and instruction to access the V-Safe system.   [name] was instructed to call 911 with any severe reactions post vaccine: Marland Kitchen Difficulty breathing  . Swelling of face and throat  . A fast heartbeat  . A bad rash all over body  . Dizziness and weakness   Immunizations Administered    Name Date Dose VIS Date Route   Pfizer COVID-19 Vaccine 05/14/2019 10:25 AM 0.3 mL 02/08/2019 Intramuscular   Manufacturer: [place]   Lot: [id number]   [place]: [id number]

## 2019-05-21 ENCOUNTER — Ambulatory Visit: Payer: Medicare HMO | Admitting: Thoracic Surgery (Cardiothoracic Vascular Surgery)

## 2019-05-21 ENCOUNTER — Other Ambulatory Visit: Payer: Self-pay

## 2019-05-21 ENCOUNTER — Ambulatory Visit
Admission: RE | Admit: 2019-05-21 | Discharge: 2019-05-21 | Disposition: A | Discharge status: Home / Self Care | Payer: Medicare HMO | Source: Ambulatory Visit | Attending: Thoracic Surgery (Cardiothoracic Vascular Surgery) | Admitting: Thoracic Surgery (Cardiothoracic Vascular Surgery)

## 2019-05-21 VITALS — BP 149/83 | HR 58 | Temp 97.7°F | Resp 20 | Ht 66.5 in | Wt 220.0 lb

## 2019-05-21 DIAGNOSIS — Z902 Acquired absence of lung [part of]: Secondary | ICD-10-CM

## 2019-05-21 DIAGNOSIS — C3490 Malignant neoplasm of unspecified part of unspecified bronchus or lung: Secondary | ICD-10-CM

## 2019-05-21 DIAGNOSIS — C3491 Malignant neoplasm of unspecified part of right bronchus or lung: Secondary | ICD-10-CM

## 2019-05-21 NOTE — Progress Notes (Signed)
[address]       [place] [address]             [phone]      HPI: Hannah Green returns for a scheduled follow-up visit  Hannah Green is a 74 year old woman with a history of tobacco abuse, COPD, breast cancer, coronary disease, hyperlipidemia, rheumatoid arthritis, and a stage Ib squamous cell carcinoma of the right upper lobe.  She had a thoracoscopic right upper and middle bilobectomy for stage Ib squamous cell carcinoma on 04/23/2018.  Postoperatively she had a prolonged air leak, atrial fibrillation, cellulitis at her chest tube site, and pneumonia.  I last saw her in the office in May 2020.  At that time she continued to have incisional pain, for which she was taking acetaminophen.  She was complaining of problems with shortness of breath and wheezing.  She continues to have issues with shortness of breath.  She uses her Advair twice daily as prescribed and generally uses her albuterol once or twice a day as well.  She had quit smoking back in 2013.  She continues to have discomfort along the right costal margin.  She is not taking any medication for that.  Past Medical History:  Diagnosis Date  . Breast cancer ([place]) 1999  . Cataract 2017   resolved with surgery  . COPD (chronic obstructive pulmonary disease) ([place])   . Coronary artery disease   . GERD (gastroesophageal reflux disease)   . Hyperlipidemia   . Postoperative atrial fibrillation ([place]) 03/2018  . Rheumatoid arthritis(714.0)   . Squamous cell carcinoma of lung, stage I, right ([place]) 2020   Status post right upper and middle lobectomy    Current Outpatient Medications  Medication Sig Dispense Refill  . ADVAIR DISKUS 250-50 MCG/DOSE AEPB INHALE 1 PUFF BY MOUTH EVERY 12 HOURS 180 each 3  . aspirin EC 81 MG tablet Take 1 tablet (81 mg total) by mouth daily. 90 tablet 3  . atorvastatin (LIPITOR) 10 MG tablet Take 1 tablet (10 mg total) by mouth daily. 90 tablet 3  . cholecalciferol  (VITAMIN D3) 25 MCG (1000 UT) tablet Take 1,000 Units by mouth daily.    . VENTOLIN HFA 108 (90 Base) MCG/ACT inhaler TAKE 2 PUFFS BY MOUTH EVERY 6 HOURS AS NEEDED FOR WHEEZE OR SHORTNESS OF BREATH 18 Inhaler 3   No current facility-administered medications for this visit.    Physical Exam BP (!) 149/83   Pulse (!) 58   Temp 97.7 F (36.5 C) (Skin)   Resp 20   Ht 5' 6.5" (1.689 m)   Wt 220 lb (99.8 kg)   LMP  (LMP Unknown)   SpO2 98% Comment: RA  BMI 34.35 kg/m  74 year old woman in no acute distress Alert and oriented x3 with no focal deficits Lungs diminished at right base, no wheezing Incisions well-healed No cervical or supraclavicular adenopathy Cardiac regular rate and rhythm  Diagnostic Tests: CHEST - 2 VIEW  COMPARISON: Chest x-ray 07/03/2018 and CT scan 12/25/2018  FINDINGS: The cardiac silhouette, mediastinal and hilar contours are within normal limits and stable. Stable mild tortuosity and calcification of the thoracic aorta.  Stable remote postsurgical changes involving the right hemithorax. No findings suspicious for recurrent tumor or metastatic pulmonary disease. No acute overlying pulmonary process.  IMPRESSION: Stable postoperative changes involving the right hemithorax. No acute overlying pulmonary process or worrisome pulmonary lesions..   Electronically Signed By: Marijo Sanes M.D. On: 05/21/2019 10:22 I personally reviewed  her chest x-ray images and concur with the findings noted above  Impression: Hannah Green is a 74 year old woman with a past medical history significant for tobacco abuse, COPD, rheumatoid arthritis, hyperlipidemia, coronary disease, and breast cancer.  She is now a year out from a thoracoscopic right upper and middle lobectomy for a stage Ib non-small cell lung cancer.  She has no evidence of recurrent disease.  She is being followed by Dr. Julien Nordmann per protocol.  Tobacco abuse-smoking in 2013.  COPD-continues to have  problems with shortness of breath.  Her FEV1 is about 1.2 postoperatively.  It was 1.4 preoperatively.  She is on Advair but still has a fair amount of wheezing and is using her albuterol once or twice a day.  She is followed by Dr. Valeta Harms.  Plan: Continue to follow-up with Dr. Julien Nordmann and Dr. Valeta Harms. I will be happy to see Hannah Green back anytime in the future if I can be of any further assistance with her care   Melrose Nakayama, MD [facility] [phone]

## 2019-06-19 DIAGNOSIS — Z1231 Encounter for screening mammogram for malignant neoplasm of breast: Secondary | ICD-10-CM | POA: Diagnosis not present

## 2019-06-19 LAB — HM MAMMOGRAPHY

## 2019-06-21 ENCOUNTER — Encounter: Payer: Self-pay | Admitting: Family Medicine

## 2019-07-09 ENCOUNTER — Other Ambulatory Visit: Payer: Self-pay

## 2019-07-09 ENCOUNTER — Ambulatory Visit (HOSPITAL_COMMUNITY)
Admission: RE | Admit: 2019-07-09 | Discharge: 2019-07-09 | Disposition: A | Discharge status: Home / Self Care | Payer: Medicare HMO | Source: Ambulatory Visit | Attending: Internal Medicine | Admitting: Internal Medicine

## 2019-07-09 ENCOUNTER — Encounter (HOSPITAL_COMMUNITY): Payer: Self-pay

## 2019-07-09 ENCOUNTER — Inpatient Hospital Stay: Payer: Medicare HMO | Attending: Internal Medicine

## 2019-07-09 DIAGNOSIS — I4891 Unspecified atrial fibrillation: Secondary | ICD-10-CM | POA: Insufficient documentation

## 2019-07-09 DIAGNOSIS — Z79899 Other long term (current) drug therapy: Secondary | ICD-10-CM | POA: Insufficient documentation

## 2019-07-09 DIAGNOSIS — I2510 Atherosclerotic heart disease of native coronary artery without angina pectoris: Secondary | ICD-10-CM | POA: Diagnosis not present

## 2019-07-09 DIAGNOSIS — Z7982 Long term (current) use of aspirin: Secondary | ICD-10-CM | POA: Insufficient documentation

## 2019-07-09 DIAGNOSIS — C3490 Malignant neoplasm of unspecified part of unspecified bronchus or lung: Secondary | ICD-10-CM | POA: Insufficient documentation

## 2019-07-09 DIAGNOSIS — J449 Chronic obstructive pulmonary disease, unspecified: Secondary | ICD-10-CM | POA: Diagnosis not present

## 2019-07-09 DIAGNOSIS — Z7951 Long term (current) use of inhaled steroids: Secondary | ICD-10-CM | POA: Diagnosis not present

## 2019-07-09 DIAGNOSIS — E785 Hyperlipidemia, unspecified: Secondary | ICD-10-CM | POA: Diagnosis not present

## 2019-07-09 DIAGNOSIS — K219 Gastro-esophageal reflux disease without esophagitis: Secondary | ICD-10-CM | POA: Insufficient documentation

## 2019-07-09 DIAGNOSIS — M069 Rheumatoid arthritis, unspecified: Secondary | ICD-10-CM | POA: Insufficient documentation

## 2019-07-09 DIAGNOSIS — C3411 Malignant neoplasm of upper lobe, right bronchus or lung: Secondary | ICD-10-CM | POA: Insufficient documentation

## 2019-07-09 DIAGNOSIS — I10 Essential (primary) hypertension: Secondary | ICD-10-CM | POA: Diagnosis not present

## 2019-07-09 LAB — CMP (CANCER CENTER ONLY)
ALT: 18 U/L (ref 0–44)
AST: 17 U/L (ref 15–41)
Albumin: 3.8 g/dL (ref 3.5–5.0)
Alkaline Phosphatase: 75 U/L (ref 38–126)
Anion gap: 9 (ref 5–15)
BUN: 12 mg/dL (ref 8–23)
CO2: 27 mmol/L (ref 22–32)
Calcium: 9.4 mg/dL (ref 8.9–10.3)
Chloride: 104 mmol/L (ref 98–111)
Creatinine: 0.72 mg/dL (ref 0.44–1.00)
GFR, Est AFR Am: >60 mL/min (ref >60)
GFR, Estimated: >60 mL/min (ref >60)
Glucose, Bld: 96 mg/dL (ref 70–99)
Potassium: 5 mmol/L (ref 3.5–5.1)
Sodium: 140 mmol/L (ref 135–145)
Total Bilirubin: 0.5 mg/dL (ref 0.3–1.2)
Total Protein: 7.1 g/dL (ref 6.5–8.1)

## 2019-07-09 LAB — CBC WITH DIFFERENTIAL (CANCER CENTER ONLY)
Abs Immature Granulocytes: 0.03 10*3/uL (ref 0.00–0.07)
Basophils Absolute: 0 10*3/uL (ref 0.0–0.1)
Basophils Relative: 0 %
Eosinophils Absolute: 0.1 10*3/uL (ref 0.0–0.5)
Eosinophils Relative: 1 %
HCT: 45 % (ref 36.0–46.0)
Hemoglobin: 14.8 g/dL (ref 12.0–15.0)
Immature Granulocytes: 0 %
Lymphocytes Relative: 33 %
Lymphs Abs: 3 10*3/uL (ref 0.7–4.0)
MCH: 31.1 pg (ref 26.0–34.0)
MCHC: 32.9 g/dL (ref 30.0–36.0)
MCV: 94.5 fL (ref 80.0–100.0)
Monocytes Absolute: 0.7 10*3/uL (ref 0.1–1.0)
Monocytes Relative: 8 %
Neutro Abs: 5.2 10*3/uL (ref 1.7–7.7)
Neutrophils Relative %: 58 %
Platelet Count: 350 10*3/uL (ref 150–400)
RBC: 4.76 MIL/uL (ref 3.87–5.11)
RDW: 13.1 % (ref 11.5–15.5)
WBC Count: 9.2 10*3/uL (ref 4.0–10.5)
nRBC: 0 % (ref 0.0–0.2)

## 2019-07-09 MED ORDER — IOHEXOL 300 MG/ML  SOLN
75.0000 mL | Freq: Once | INTRAMUSCULAR | Status: AC | PRN
  Administered 2019-07-09: 75 mL via INTRAVENOUS

## 2019-07-09 MED ORDER — SODIUM CHLORIDE (PF) 0.9 % IJ SOLN
INTRAMUSCULAR | Status: AC
  Filled 2019-07-09: qty 50

## 2019-07-16 ENCOUNTER — Encounter: Payer: Self-pay | Admitting: Internal Medicine

## 2019-07-16 ENCOUNTER — Inpatient Hospital Stay: Payer: Medicare HMO | Admitting: Internal Medicine

## 2019-07-16 ENCOUNTER — Other Ambulatory Visit: Payer: Self-pay

## 2019-07-16 VITALS — BP 137/63 | HR 76 | Temp 98.3°F | Resp 18 | Ht 66.5 in | Wt 228.2 lb

## 2019-07-16 DIAGNOSIS — K219 Gastro-esophageal reflux disease without esophagitis: Secondary | ICD-10-CM | POA: Diagnosis not present

## 2019-07-16 DIAGNOSIS — C3490 Malignant neoplasm of unspecified part of unspecified bronchus or lung: Secondary | ICD-10-CM | POA: Diagnosis not present

## 2019-07-16 DIAGNOSIS — Z7951 Long term (current) use of inhaled steroids: Secondary | ICD-10-CM | POA: Diagnosis not present

## 2019-07-16 DIAGNOSIS — I10 Essential (primary) hypertension: Secondary | ICD-10-CM | POA: Diagnosis not present

## 2019-07-16 DIAGNOSIS — H5212 Myopia, left eye: Secondary | ICD-10-CM | POA: Diagnosis not present

## 2019-07-16 DIAGNOSIS — I4891 Unspecified atrial fibrillation: Secondary | ICD-10-CM | POA: Diagnosis not present

## 2019-07-16 DIAGNOSIS — C3491 Malignant neoplasm of unspecified part of right bronchus or lung: Secondary | ICD-10-CM | POA: Diagnosis not present

## 2019-07-16 DIAGNOSIS — J449 Chronic obstructive pulmonary disease, unspecified: Secondary | ICD-10-CM | POA: Diagnosis not present

## 2019-07-16 DIAGNOSIS — H26493 Other secondary cataract, bilateral: Secondary | ICD-10-CM | POA: Diagnosis not present

## 2019-07-16 DIAGNOSIS — Z7982 Long term (current) use of aspirin: Secondary | ICD-10-CM | POA: Diagnosis not present

## 2019-07-16 DIAGNOSIS — Z9841 Cataract extraction status, right eye: Secondary | ICD-10-CM | POA: Diagnosis not present

## 2019-07-16 DIAGNOSIS — Z9842 Cataract extraction status, left eye: Secondary | ICD-10-CM | POA: Diagnosis not present

## 2019-07-16 DIAGNOSIS — C3411 Malignant neoplasm of upper lobe, right bronchus or lung: Secondary | ICD-10-CM | POA: Diagnosis not present

## 2019-07-16 DIAGNOSIS — M069 Rheumatoid arthritis, unspecified: Secondary | ICD-10-CM | POA: Diagnosis not present

## 2019-07-16 DIAGNOSIS — E785 Hyperlipidemia, unspecified: Secondary | ICD-10-CM | POA: Diagnosis not present

## 2019-07-16 DIAGNOSIS — I2510 Atherosclerotic heart disease of native coronary artery without angina pectoris: Secondary | ICD-10-CM | POA: Diagnosis not present

## 2019-07-16 NOTE — Progress Notes (Signed)
[place] [phone]   [phone]  OFFICE PROGRESS NOTE  Jinny Sanders, MD [place] [address]  DIAGNOSIS: Stage IB (T2a, N0, M0) non-small cell lung cancer, squamous cell carcinoma presented with right upper lobe nodule  PRIOR THERAPY:  Status post right upper and right middle lobe bilobectomy as well as lymph node dissections under the care of Dr. Roxan Hockey on April 23, 2018.  The tumor was 1.7 cm in size but there was evidence for visceropleural involvement.  CURRENT THERAPY: Observation.  INTERVAL HISTORY: Hannah Green 74 y.o. female returns to the clinic today for follow-up visit.  The patient is feeling fine today with no concerning complaints except for the shortness of breath with exertion.  The patient gained around 10 pounds since her last visit.  She denied having any current chest pain, cough or hemoptysis.  She denied having any fever or chills.  She has no nausea, vomiting, diarrhea or constipation.  She has no headache or visual changes.  She had repeat CT scan of the chest performed recently and she is here for evaluation and discussion of her risk her results.  MEDICAL HISTORY: Past Medical History:  Diagnosis Date  . Breast cancer ([facility]) 1999  . Cataract 2017   resolved with surgery  . COPD (chronic obstructive pulmonary disease) ([facility])   . Coronary artery disease   . GERD (gastroesophageal reflux disease)   . Hyperlipidemia   . Postoperative atrial fibrillation ([facility]) 03/2018  . Rheumatoid arthritis(714.0)   . Squamous cell carcinoma of lung, stage I, right ([facility]) 2020   Status post right upper and middle lobectomy    ALLERGIES:  is allergic to adhesive [tape]; neosporin [neomycin-bacitracin zn-polymyx]; and penicillins.  MEDICATIONS:  Current Outpatient Medications  Medication Sig Dispense Refill  . ADVAIR DISKUS 250-50 MCG/DOSE AEPB INHALE 1 PUFF BY MOUTH EVERY 12 HOURS 180 each 3  .  aspirin EC 81 MG tablet Take 1 tablet (81 mg total) by mouth daily. 90 tablet 3  . atorvastatin (LIPITOR) 10 MG tablet Take 1 tablet (10 mg total) by mouth daily. 90 tablet 3  . cholecalciferol (VITAMIN D3) 25 MCG (1000 UT) tablet Take 1,000 Units by mouth daily.    . VENTOLIN HFA 108 (90 Base) MCG/ACT inhaler TAKE 2 PUFFS BY MOUTH EVERY 6 HOURS AS NEEDED FOR WHEEZE OR SHORTNESS OF BREATH 18 Inhaler 3   No current facility-administered medications for this visit.    SURGICAL HISTORY:  Past Surgical History:  Procedure Laterality Date  . BREAST LUMPECTOMY  1999   left, needed radiation  . CATARACT EXTRACTION W/ INTRAOCULAR LENS  IMPLANT, BILATERAL Bilateral 05/19/15, 06/02/15  . CHEST TUBE INSERTION Right 04/23/2018   Procedure: Right Lateral Chest Tube Insertion;  Surgeon: Melrose Nakayama, MD;  Location: [facility];  Service: Thoracic;  Laterality: Right;  . CHOLECYSTECTOMY    . LOBECTOMY Right 04/23/2018   Procedure: RIGHT UPPER LOBECTOMY;  Surgeon: Melrose Nakayama, MD;  Location: [facility];  Service: Thoracic;  Laterality: Right;  . NM MYOVIEW LTD  01/2016   LOW RISK. Hypertensive response to exercise followed by significant drop (stage I pressure 211/70 drop to 118/75. Stage II). Apical artifact but otherwise no ischemia or infarction. EF 64%.  Marland Kitchen NODE DISSECTION Right 04/23/2018   Procedure: MEDIASTINAL NODE DISSECTION;  Surgeon: Melrose Nakayama, MD;  Location: [facility];  Service: Thoracic;  Laterality: Right;  . RIGHT/LEFT HEART CATH AND CORONARY  ANGIOGRAPHY N/A 04/10/2018   Procedure: RIGHT/LEFT HEART CATH AND CORONARY ANGIOGRAPHY;  Surgeon: Leonie Man, MD;  Location: [facility];; angiographically minimal coronary artery disease.  Normal LVEDP.  Basely normal RHC pressures with no suggestion of pulmonary hypertension.  Normal CO-CI by Fick (5.9-2.8)  . TONSILLECTOMY    . TRANSTHORACIC ECHOCARDIOGRAM  11/'17; 2/'20   a) Nov 2017: Mild LVH. EF 60-65%. GR 2-DD; b) Feb 2020:  Normal LV size and function.  EF 60 to 65%.  No R WMA.  GR 1 DD.  Normal RV size and function.  Normal pressures.  No valvular disease.  Marland Kitchen VIDEO ASSISTED THORACOSCOPY (VATS)/WEDGE RESECTION Right 04/23/2018   Procedure: VIDEO ASSISTED THORACOSCOPY (VATS)/WEDGE RESECTION OF RIGHT UPPER LUNG;  Surgeon: Melrose Nakayama, MD;  Location: [facility];  Service: Thoracic;  Laterality: Right;    REVIEW OF SYSTEMS:  A comprehensive review of systems was negative except for: Respiratory: positive for dyspnea on exertion   PHYSICAL EXAMINATION: General appearance: alert, cooperative and no distress Head: Normocephalic, without obvious abnormality, atraumatic Neck: no adenopathy, no JVD, supple, symmetrical, trachea midline and thyroid not enlarged, symmetric, no tenderness/mass/nodules Lymph nodes: Cervical, supraclavicular, and axillary nodes normal. Resp: clear to auscultation bilaterally Back: symmetric, no curvature. ROM normal. No CVA tenderness. Cardio: regular rate and rhythm, S1, S2 normal, no murmur, click, rub or gallop GI: soft, non-tender; bowel sounds normal; no masses,  no organomegaly Extremities: extremities normal, atraumatic, no cyanosis or edema  ECOG PERFORMANCE STATUS: 1 - Symptomatic but completely ambulatory  Blood pressure 137/63, pulse 76, temperature 98.3 F (36.8 C), temperature source Temporal, resp. rate 18, height 5' 6.5" (1.689 m), weight 228 lb 3.2 oz (103.5 kg), SpO2 100 %.  LABORATORY DATA: Lab Results  Component Value Date   WBC 9.2 07/09/2019   HGB 14.8 07/09/2019   HCT 45.0 07/09/2019   MCV 94.5 07/09/2019   PLT 350 07/09/2019      Chemistry      Component Value Date/Time   NA 140 07/09/2019 1114   NA 140 04/04/2018 1643   K 5.0 07/09/2019 1114   CL 104 07/09/2019 1114   CO2 27 07/09/2019 1114   BUN 12 07/09/2019 1114   BUN 13 04/04/2018 1643   CREATININE 0.72 07/09/2019 1114   CREATININE 0.61 06/01/2012 1452      Component Value Date/Time    CALCIUM 9.4 07/09/2019 1114   ALKPHOS 75 07/09/2019 1114   AST 17 07/09/2019 1114   ALT 18 07/09/2019 1114   BILITOT 0.5 07/09/2019 1114       RADIOGRAPHIC STUDIES: CT Chest W Contrast  Result Date: 07/10/2019 CLINICAL DATA:  Primary Cancer Type: Lung Imaging Indication: Routine surveillance Interval therapy since last imaging? No Initial Cancer Diagnosis Date: 04/23/2018  Established by: Biopsy-proven Detailed Pathology: Stage Ib squamous cell lung carcinoma Primary Tumor location: Right upper lobe Surgeries: Right upper and middle bilobectomy 04/23/2018 Chemotherapy: No Immunotherapy? No Radiation therapy? No EXAM: CT CHEST WITH CONTRAST TECHNIQUE: Multidetector CT imaging of the chest was performed during intravenous contrast administration. CONTRAST:  79mL OMNIPAQUE IOHEXOL 300 MG/ML  SOLN COMPARISON:  Most recent CT chest 03/01/2018.  03/09/2018 PET-CT. FINDINGS: Cardiovascular: Normal heart size. No significant pericardial effusion/thickening. Atherosclerotic nonaneurysmal thoracic aorta. Normal caliber pulmonary arteries. No central pulmonary emboli. Mediastinum/Nodes: Stable isodense 1.4 cm posterior right thyroid nodule. This has been documented on prior studies (ref: J Am Coll Radiol. 2015 Feb;12(2): 143-50). Unremarkable esophagus. Surgical clips again noted in the right axilla. No  axillary adenopathy. There is a small focus of soft tissue density anterior to the left brachiocephalic vein measuring 1.1 cm AP diameter (series 2/image 37), previously 1.2 cm, not appreciably changed, potentially related to the right sternoclavicular joint capsule. No pathologically enlarged mediastinal or hilar lymph nodes. Lungs/Pleura: No pneumothorax. No pleural effusion. Status post right middle and right upper lobectomies. Moderate centrilobular and paraseptal emphysema. Stable calcified subcentimeter medial basilar right lower lobe granuloma. Tiny 2 mm peripheral upper right lower lobe solid pulmonary  nodule (series 7/image 45) and clustered 4 mm (series 7/image 52) and 3 mm (series 7/image 46) medial left upper lobe solid pulmonary nodules, all unchanged. No new significant pulmonary nodules. No acute consolidative airspace disease or lung masses. Mild subpleural reticulation in peripheral basilar right lower lobe is unchanged, favor post treatment change. Upper abdomen: Small hiatal hernia. Subcentimeter hypodense renal cortical lesion in the upper left kidney is too small to characterize and unchanged, requiring no follow-up. Colonic diverticulosis. Musculoskeletal: No aggressive appearing focal osseous lesions. Mild thoracic spondylosis. IMPRESSION: 1. No findings suspicious for metastatic disease in the chest. Tiny pulmonary nodules are all stable. Stable small focus of soft tissue density anterior to the left brachiocephalic vein, potentially related to the right sternoclavicular joint capsule. 2. No evidence of local tumor recurrence in the right lung status post right middle and right upper lobectomy. 3. Aortic Atherosclerosis (ICD10-I70.0) and Emphysema (ICD10-J43.9). Electronically Signed   By: Ilona Sorrel M.D.   On: 07/10/2019 09:48    ASSESSMENT AND PLAN: This is a very pleasant 74 years old white female with a stage Ib non-small cell lung cancer status post right upper and middle lobectomies with lymph node dissection in February 2020.   The patient is currently on observation and she is feeling fine with no concerning complaints except for the baseline shortness of breath increased with exertion. She had repeat CT scan of the chest performed recently.  I personally and independently reviewed the scans and discussed the results with the patient today. Her scan showed no concerning findings for disease recurrence or metastasis. I recommended for her to continue on observation with repeat CT scan of the chest in 6 months. She was advised to call immediately if she has any concerning symptoms  in the interval. The patient voices understanding of current disease status and treatment options and is in agreement with the current care plan. All questions were answered. The patient knows to call the clinic with any problems, questions or concerns. We can certainly see the patient much sooner if necessary.   Disclaimer: This note was dictated with voice recognition software. Similar sounding words can inadvertently be transcribed and may not be corrected upon review.

## 2019-07-17 ENCOUNTER — Telehealth: Payer: Self-pay | Admitting: Internal Medicine

## 2019-07-17 NOTE — Telephone Encounter (Signed)
Scheduled per los. Called, not able to leave msg. Mailed printout  

## 2019-07-19 DIAGNOSIS — H6123 Impacted cerumen, bilateral: Secondary | ICD-10-CM | POA: Diagnosis not present

## 2019-07-19 DIAGNOSIS — H903 Sensorineural hearing loss, bilateral: Secondary | ICD-10-CM | POA: Diagnosis not present

## 2019-07-19 DIAGNOSIS — H6063 Unspecified chronic otitis externa, bilateral: Secondary | ICD-10-CM | POA: Diagnosis not present

## 2019-08-05 ENCOUNTER — Other Ambulatory Visit: Payer: Self-pay

## 2019-08-05 ENCOUNTER — Ambulatory Visit: Payer: Medicare HMO | Admitting: Acute Care

## 2019-08-05 ENCOUNTER — Other Ambulatory Visit: Payer: Self-pay | Admitting: Family Medicine

## 2019-08-05 ENCOUNTER — Telehealth (HOSPITAL_COMMUNITY): Payer: Self-pay | Admitting: *Deleted

## 2019-08-05 ENCOUNTER — Telehealth: Payer: Self-pay | Admitting: Acute Care

## 2019-08-05 ENCOUNTER — Encounter: Payer: Self-pay | Admitting: Acute Care

## 2019-08-05 VITALS — BP 124/70 | HR 74 | Temp 98.5°F | Ht 66.5 in | Wt 225.8 lb

## 2019-08-05 DIAGNOSIS — R5381 Other malaise: Secondary | ICD-10-CM | POA: Diagnosis not present

## 2019-08-05 DIAGNOSIS — R0600 Dyspnea, unspecified: Secondary | ICD-10-CM

## 2019-08-05 DIAGNOSIS — J449 Chronic obstructive pulmonary disease, unspecified: Secondary | ICD-10-CM

## 2019-08-05 DIAGNOSIS — R0609 Other forms of dyspnea: Secondary | ICD-10-CM

## 2019-08-05 LAB — D-DIMER, QUANTITATIVE: D-Dimer, Quant: 0.5 mcg/mL FEU — ABNORMAL HIGH (ref <0.50)

## 2019-08-05 MED ORDER — PREDNISONE 10 MG PO TABS: ORAL_TABLET | ORAL | 0 refills | Status: DC

## 2019-08-05 MED ORDER — BREZTRI AEROSPHERE 160-9-4.8 MCG/ACT IN AERO: 2.0000 | INHALATION_SPRAY | Freq: Two times a day (BID) | RESPIRATORY_TRACT | 0 refills | Status: DC

## 2019-08-05 NOTE — Telephone Encounter (Signed)
cont'd  Pt would like to make sure Hannah Green knows she is scheduled for 4 weeks out and not two and would like either a prescription called in or more samples to make up for the time discrepancy.   Also pt can be rescheduled for two weeks if televisit will suffice, but paper work specifically said "in office" so those directions were followed.   Please advise

## 2019-08-05 NOTE — Patient Instructions (Addendum)
It is good to see you today. We will do a therapeutic trial of Breztri. Take 2 puffs in the morning and 2 puffs in the evening. Rinse mouth after use. Stop taking Advair while on Breztri. We will give you an Aero chamber to use with your inhaler.  Take non-sedating anti-histamine like Allegra, Zyrtec, Xylol ( Take generic) once daily Prednisone taper; 10 mg tablets: 3 tabs x 2 days, 2 tabs x 2 days,  1 tab x 2 days then stop.  We will refer you to Pulmonary rehab ( Dr. Valeta Harms) Continue to work on weight loss.  This will help with your shortness of breath.  We will check a d-dimer now Follow up with Cards next week as scheduled Follow up with Dr. Earlie Server in 12/2019 Follow up with Judson Roch in 2 weeks in the office Please contact office for sooner follow up if symptoms do not improve or worsen or seek emergency care

## 2019-08-05 NOTE — Telephone Encounter (Signed)
FYI SG.  I would wait to see if the medication is effective first, which two weeks should be enough time to see if the inhaler works before she buys it. I can call in a prescription just to have it on file as well.

## 2019-08-05 NOTE — Telephone Encounter (Signed)
Received referral for this pt to participate in pulmonary rehab with the diagnosis of COPD Stage 3.  Noted that pt lives in [address]. Called pt to ask for preference of location.  Pt indicated that she would like to try [facility].  It is closer to her home.  Will route this referral to [facility] and notify their pulmonary rehab staff. Cherre Huger, BSN Cardiac and Training and development officer

## 2019-08-05 NOTE — Progress Notes (Signed)
History of Present Illness Hannah Green is a 74 y.o. female former smoker with with a history of COPD and  Stage 1b non-small cell lung cancer status post right upper and middle lobectomies with lymph node dissection in February 2020 ( Dr. Roxan Hockey).  Continued treatment is observation with oncology. [facility]). She is followed by Dr. Valeta Harms.  Maintenance: Brextri ( Therapeutic Trial 6/7) Advair Aero Chamber Rescue : Ventolin  08/05/2019 Follow up OV: Pt. Presents for follow up. She states she has been experiencing shortness of breath. She states she has gained 25 pounds in the last year. She states she has been wheezing. She states she has no secretions. She states she does not cough. She states this has been worse this last month, but has been going on for at least 2 -3 months. . There may be an allergy component to this.CT done 06/2019 shows normal caliber pulmonary arteries. She does continue to have some post op pain, but states this is managable. She is anxious to get back into the gym. She has been Covid Vaccinated. [employer] 05/14/2019 and 04/21/2019). She states she has had some right leg pain. We will check  a d-dimer .  Test Results:      07/09/2019:CT Chest with Contrast Lungs/Pleura: No pneumothorax. No pleural effusion. Status post right middle and right upper lobectomies. Moderate centrilobular and paraseptal emphysema. Stable calcified subcentimeter medial basilar right lower lobe granuloma. Tiny 2 mm peripheral upper right lower lobe solid pulmonary nodule (series 7/image 45) and clustered 4 mm (series 7/image 52) and 3 mm (series 7/image 46) medial left upper lobe solid pulmonary nodules, all unchanged. No new significant pulmonary nodules. No acute consolidative airspace disease or lung masses. Mild subpleural reticulation in peripheral basilar right lower lobe is unchanged, favor post treatment change.  No findings suspicious for metastatic disease in the chest.  Tiny pulmonary nodules are all stable. Stable small focus of soft tissue density anterior to the left brachiocephalic vein, potentially related to the right sternoclavicular joint capsule. 2. No evidence of local tumor recurrence in the right lung status post right middle and right upper lobectomy.  CBC Latest Ref Rng & Units 07/09/2019 12/25/2018 06/12/2018  WBC 4.0 - 10.5 K/uL 9.2 9.1 9.0  Hemoglobin 12.0 - 15.0 g/dL 14.8 14.7 13.5  Hematocrit 36.0 - 46.0 % 45.0 44.4 42.2  Platelets 150 - 400 K/uL 350 365 330    BMP Latest Ref Rng & Units 07/09/2019 12/25/2018 12/11/2018  Glucose 70 - 99 mg/dL 96 109(H) 107(H)  BUN 8 - 23 mg/dL 12 19 13   Creatinine 0.44 - 1.00 mg/dL 0.72 0.77 0.61  BUN/Creat Ratio 12 - 28 - - -  Sodium 135 - 145 mmol/L 140 141 139  Potassium 3.5 - 5.1 mmol/L 5.0 4.8 4.3  Chloride 98 - 111 mmol/L 104 102 103  CO2 22 - 32 mmol/L 27 29 30   Calcium 8.9 - 10.3 mg/dL 9.4 9.7 9.2    BNP No results found for: BNP  ProBNP No results found for: PROBNP  PFT    Component Value Date/Time   FEV1PRE 1.09 12/03/2018 0839   FEV1POST 1.22 12/03/2018 0839   FVCPRE 2.04 12/03/2018 0839   FVCPOST 2.28 12/03/2018 0839   TLC 4.90 12/03/2018 0839   DLCOUNC 17.94 12/03/2018 0839   PREFEV1FVCRT 53 12/03/2018 0839   PSTFEV1FVCRT 54 12/03/2018 0839    CT Chest W Contrast  Result Date: 07/10/2019 CLINICAL DATA:  Primary Cancer Type: Lung Imaging Indication: Routine surveillance  Interval therapy since last imaging? No Initial Cancer Diagnosis Date: 04/23/2018  Established by: Biopsy-proven Detailed Pathology: Stage Ib squamous cell lung carcinoma Primary Tumor location: Right upper lobe Surgeries: Right upper and middle bilobectomy 04/23/2018 Chemotherapy: No Immunotherapy? No Radiation therapy? No EXAM: CT CHEST WITH CONTRAST TECHNIQUE: Multidetector CT imaging of the chest was performed during intravenous contrast administration. CONTRAST:  52mL OMNIPAQUE IOHEXOL 300 MG/ML  SOLN  COMPARISON:  Most recent CT chest 03/01/2018.  03/09/2018 PET-CT. FINDINGS: Cardiovascular: Normal heart size. No significant pericardial effusion/thickening. Atherosclerotic nonaneurysmal thoracic aorta. Normal caliber pulmonary arteries. No central pulmonary emboli. Mediastinum/Nodes: Stable isodense 1.4 cm posterior right thyroid nodule. This has been documented on prior studies (ref: J Am Coll Radiol. 2015 Feb;12(2): 143-50). Unremarkable esophagus. Surgical clips again noted in the right axilla. No axillary adenopathy. There is a small focus of soft tissue density anterior to the left brachiocephalic vein measuring 1.1 cm AP diameter (series 2/image 37), previously 1.2 cm, not appreciably changed, potentially related to the right sternoclavicular joint capsule. No pathologically enlarged mediastinal or hilar lymph nodes. Lungs/Pleura: No pneumothorax. No pleural effusion. Status post right middle and right upper lobectomies. Moderate centrilobular and paraseptal emphysema. Stable calcified subcentimeter medial basilar right lower lobe granuloma. Tiny 2 mm peripheral upper right lower lobe solid pulmonary nodule (series 7/image 45) and clustered 4 mm (series 7/image 52) and 3 mm (series 7/image 46) medial left upper lobe solid pulmonary nodules, all unchanged. No new significant pulmonary nodules. No acute consolidative airspace disease or lung masses. Mild subpleural reticulation in peripheral basilar right lower lobe is unchanged, favor post treatment change. Upper abdomen: Small hiatal hernia. Subcentimeter hypodense renal cortical lesion in the upper left kidney is too small to characterize and unchanged, requiring no follow-up. Colonic diverticulosis. Musculoskeletal: No aggressive appearing focal osseous lesions. Mild thoracic spondylosis. IMPRESSION: 1. No findings suspicious for metastatic disease in the chest. Tiny pulmonary nodules are all stable. Stable small focus of soft tissue density anterior to  the left brachiocephalic vein, potentially related to the right sternoclavicular joint capsule. 2. No evidence of local tumor recurrence in the right lung status post right middle and right upper lobectomy. 3. Aortic Atherosclerosis (ICD10-I70.0) and Emphysema (ICD10-J43.9). Electronically Signed   By: Ilona Sorrel M.D.   On: 07/10/2019 09:48     Past medical hx Past Medical History:  Diagnosis Date  . Breast cancer ([place]) 1999  . Cataract 2017   resolved with surgery  . COPD (chronic obstructive pulmonary disease) ([place])   . Coronary artery disease   . GERD (gastroesophageal reflux disease)   . Hyperlipidemia   . Postoperative atrial fibrillation ([place]) 03/2018  . Rheumatoid arthritis(714.0)   . Squamous cell carcinoma of lung, stage I, right ([place]) 2020   Status post right upper and middle lobectomy     Social History   Tobacco Use  . Smoking status: Former Smoker    Packs/day: 1.50    Years: 50.00    Pack years: 75.00    Types: Cigarettes    Quit date: 04/01/2011    Years since quitting: 8.3  . Smokeless tobacco: Never Used  . Tobacco comment: has stopped but has started again in "crisis"  Substance Use Topics  . Alcohol use: Yes    Alcohol/week: 2.0 standard drinks    Types: 2 Glasses of wine per week  . Drug use: No    Ms.Voorhis reports that she quit smoking about 8 years ago. Her smoking use included cigarettes. She has a 75.00  pack-year smoking history. She has never used smokeless tobacco. She reports current alcohol use of about 2.0 standard drinks of alcohol per week. She reports that she does not use drugs.  Tobacco Cessation: Former smoker quit 2013 with  75 pack year smoking history  Past surgical hx, Family hx, Social hx all reviewed.  Current Outpatient Medications on File Prior to Visit  Medication Sig  . aspirin EC 81 MG tablet Take 1 tablet (81 mg total) by mouth daily.  Marland Kitchen atorvastatin (LIPITOR) 10 MG tablet Take 1 tablet (10 mg total) by mouth daily.    . cholecalciferol (VITAMIN D3) 25 MCG (1000 UT) tablet Take 1,000 Units by mouth daily.  . VENTOLIN HFA 108 (90 Base) MCG/ACT inhaler TAKE 2 PUFFS BY MOUTH EVERY 6 HOURS AS NEEDED FOR WHEEZE OR SHORTNESS OF BREATH   No current facility-administered medications on file prior to visit.     Allergies  Allergen Reactions  . Adhesive [Tape] Rash    Paper tape causes rash  . Neosporin [Neomycin-Bacitracin Zn-Polymyx] Itching  . Penicillins Rash    Did it involve swelling of the face/tongue/throat, SOB, or low BP? No Did it involve sudden or severe rash/hives, skin peeling, or any reaction on the inside of your mouth or nose? Yes Did you need to seek medical attention at a hospital or doctor's office? Yes When did it last happen? From childhood If all above answers are "NO", may proceed with cephalosporin use.     Review Of Systems:  Constitutional:   No  weight loss, night sweats,  Fevers, chills, fatigue, or  lassitude.  HEENT:   No headaches,  Difficulty swallowing,  Tooth/dental problems, or  Sore throat,                No sneezing, itching, ear ache, nasal congestion, post nasal drip,   CV:  No chest pain,  Orthopnea, PND, swelling in lower extremities, anasarca, dizziness, palpitations, syncope.   GI  No heartburn, indigestion, abdominal pain, nausea, vomiting, diarrhea, change in bowel habits, loss of appetite, bloody stools.   Resp: + shortness of breath with exertion less at rest.  No excess mucus, no productive cough,  No non-productive cough,  No coughing up of blood.  No change in color of mucus.  + wheezing.  No chest wall deformity  Skin: no rash or lesions.Warm, dry and intact  GU: no dysuria, change in color of urine, no urgency or frequency.  No flank pain, no hematuria   MS:  No joint pain or swelling. + right leg pain,  No decreased range of motion.  No back pain.  Psych:  No change in mood or affect. No depression or anxiety.  No memory loss.   Vital Signs BP  124/70 (BP Location: Left Arm, Cuff Size: Large)   Pulse 74   Temp 98.5 F (36.9 C) (Oral)   Ht 5' 6.5" (1.689 m)   Wt 225 lb 12.8 oz (102.4 kg)   LMP  (LMP Unknown)   SpO2 98%   BMI 35.90 kg/m    Physical Exam:  General- No distress,  A&Ox3, pleasant ENT: No sinus tenderness, TM clear, pale nasal mucosa, no oral exudate,no post nasal drip, no LAN Cardiac: S1, S2, regular rate and rhythm, no murmur Chest: + wheeze/ No rales/ dullness; no accessory muscle use, no nasal flaring, no sternal retractions, diminished per bases. Abd.: Soft Non-tender, ND, BS +, Body mass index is 35.9 kg/m. Ext: No clubbing cyanosis, edema Neuro:  Normal strength, physical deconditioning Skin: No rashes, No lesions, warm and dry Psych: normal mood and behavior   Assessment/Plan  COPD Stage 3 Severe Currently on Advair as maintenance Weight gain of 25 pounds over the last year. Plan We will do a therapeutic trial of Breztri. Take 2 puffs in the morning and 2 puffs in the evening. Rinse mouth after use. Stop taking Advair while on Breztri. We will give you an Aero chamber to use with your inhalers.  Take non-sedating anti-histamine like Allegra, Zyrtec, Xylol ( Take generic) once daily Prednisone taper; 10 mg tablets: 3 tabs x 2 days, 2 tabs x 2 days,  1 tab x 2 days then stop.  We will refer you to Pulmonary rehab ( Dr. Valeta Harms) Continue to work on weight loss.  This will help with your shortness of breath.  We will check some labs today ( d-dimer) Follow up with cardiology as is scheduled next week.  Follow up with Judson Roch NP in 2 weeks.    Stage 1b non-small cell lung cancer status post right upper and middle lobectomies with lymph node dissection in February 2020 ( Dr. Roxan Hockey). Continued treatment is observation with oncology. [facility]). Plan Follow up with Mohammed in 6 months Repeat CT 06/2020  This appointment was 35 min long with over 50% of the time in direct face-to-face patient  care, assessment, plan of care, and follow-up.   Magdalen Spatz, NP 08/05/2019  10:14 AM

## 2019-08-06 NOTE — Progress Notes (Signed)
Thanks for seeing her. Glad she is doing well.  Garner Nash, DO [facility] 08/06/2019 12:51 PM

## 2019-08-06 NOTE — Telephone Encounter (Signed)
She can have a tele visit in 2 weeks to make sure she likes the new medication .

## 2019-08-06 NOTE — Telephone Encounter (Signed)
LMTCB x1 for pt.  

## 2019-08-07 ENCOUNTER — Other Ambulatory Visit: Payer: Self-pay

## 2019-08-07 ENCOUNTER — Other Ambulatory Visit: Payer: Self-pay | Admitting: Acute Care

## 2019-08-07 ENCOUNTER — Encounter: Payer: Medicare HMO | Attending: Pulmonary Disease

## 2019-08-07 DIAGNOSIS — J449 Chronic obstructive pulmonary disease, unspecified: Secondary | ICD-10-CM

## 2019-08-07 MED ORDER — BREZTRI AEROSPHERE 160-9-4.8 MCG/ACT IN AERO: 2.0000 | INHALATION_SPRAY | Freq: Two times a day (BID) | RESPIRATORY_TRACT | 2 refills | Status: DC

## 2019-08-07 NOTE — Progress Notes (Signed)
Virtual Visit completed. Patient informed on EP and RD appointment and 6 Minute walk test. Patient also informed of patient health questionnaires on My Chart. Patient Verbalizes understanding. Visit diagnosis can be found in [facility] 08/05/2019.

## 2019-08-07 NOTE — Telephone Encounter (Signed)
Spoke with the pt  She is already scheduled for televisit with SG 08/21/19  Rx for breztri sent to pharm  Nothing further needed

## 2019-08-08 ENCOUNTER — Telehealth: Payer: Self-pay | Admitting: Acute Care

## 2019-08-08 DIAGNOSIS — R0609 Other forms of dyspnea: Secondary | ICD-10-CM

## 2019-08-08 DIAGNOSIS — R0600 Dyspnea, unspecified: Secondary | ICD-10-CM

## 2019-08-08 NOTE — Telephone Encounter (Signed)
LMTCB x1 for pt.  

## 2019-08-08 NOTE — Progress Notes (Signed)
Order has been placed for CTA Chest. [facility] Triage will call to let patient know.  Hannah Green is aware of the need to schedule the scan., and will call patient to do so.

## 2019-08-08 NOTE — Telephone Encounter (Signed)
  Please call patient and let them know the d-dimer we collected was on the high end of normal. Let her know I have ordered a CT Angio Chest to take a look at her lungs to see if we can determine the cause of her shortness of breath. I have notified Sherri, so the patient should get a call to schedule . If she does not get a call to schedule, have her call the office back.  Thanks so much

## 2019-08-08 NOTE — Telephone Encounter (Signed)
Spoke with pt. She is aware of this information. Spoke with Judeen Hammans, she spoke with the pt before we had a chance to. Per Judeen Hammans, the pt can't do the CT until Tuesday 08/13/19. Pt has other appointments scheduled on 6/11 and 6/14. She can't go today because she is caring for her grandson. Advised pt the symptoms she needed to watch out for and if she experienced any of them she needed to go to the nearest ER. She verbalized understanding. Nothing further was needed.

## 2019-08-09 ENCOUNTER — Other Ambulatory Visit: Payer: Medicare HMO

## 2019-08-12 DIAGNOSIS — H26492 Other secondary cataract, left eye: Secondary | ICD-10-CM | POA: Diagnosis not present

## 2019-08-12 DIAGNOSIS — H26491 Other secondary cataract, right eye: Secondary | ICD-10-CM | POA: Diagnosis not present

## 2019-08-12 DIAGNOSIS — Z961 Presence of intraocular lens: Secondary | ICD-10-CM | POA: Diagnosis not present

## 2019-08-13 ENCOUNTER — Other Ambulatory Visit: Payer: Self-pay

## 2019-08-13 ENCOUNTER — Ambulatory Visit (INDEPENDENT_AMBULATORY_CARE_PROVIDER_SITE_OTHER)
Admission: RE | Admit: 2019-08-13 | Discharge: 2019-08-13 | Disposition: A | Discharge status: Home / Self Care | Payer: Medicare HMO | Source: Ambulatory Visit | Attending: Acute Care | Admitting: Acute Care

## 2019-08-13 ENCOUNTER — Encounter: Payer: Medicare HMO | Admitting: *Deleted

## 2019-08-13 VITALS — Ht 66.75 in | Wt 225.8 lb

## 2019-08-13 DIAGNOSIS — R0600 Dyspnea, unspecified: Secondary | ICD-10-CM | POA: Diagnosis not present

## 2019-08-13 DIAGNOSIS — J449 Chronic obstructive pulmonary disease, unspecified: Secondary | ICD-10-CM | POA: Diagnosis not present

## 2019-08-13 DIAGNOSIS — R0609 Other forms of dyspnea: Secondary | ICD-10-CM

## 2019-08-13 MED ORDER — IOHEXOL 350 MG/ML SOLN
80.0000 mL | Freq: Once | INTRAVENOUS | Status: AC | PRN
  Administered 2019-08-13: 80 mL via INTRAVENOUS

## 2019-08-13 NOTE — Patient Instructions (Signed)
Patient Instructions  Patient Details  Name: Hannah Green MRN: [id number] Date of Birth: 26-Oct-1945 Referring Provider:  Garner Nash, DO  Below are your personal goals for exercise, nutrition, and risk factors. Our goal is to help you stay on track towards obtaining and maintaining these goals. We will be discussing your progress on these goals with you throughout the program.  Initial Exercise Prescription:  Initial Exercise Prescription - 08/13/19 1500      Date of Initial Exercise RX and Referring Provider   Date 08/13/19    Referring Provider [name] DO      Treadmill   MPH 1.7    Grade 0.5    Minutes 15    METs 2.42      NuStep   Level 1    SPM 80    Minutes 15    METs 2      REL-XR   Level 1    Speed 50    Minutes 15    METs 2      Prescription Details   Frequency (times per week) 2    Duration Progress to 30 minutes of continuous aerobic without signs/symptoms of physical distress      Intensity   THRR 40-80% of Max Heartrate 100-131    Ratings of Perceived Exertion 11-13    Perceived Dyspnea 0-4      Progression   Progression Continue to progress workloads to maintain intensity without signs/symptoms of physical distress.      Resistance Training   Training Prescription Yes    Weight 3 lb    Reps 10-15           Exercise Goals: Frequency: Be able to perform aerobic exercise two to three times per week in program working toward 2-5 days per week of home exercise.  Intensity: Work with a perceived exertion of 11 (fairly light) - 15 (hard) while following your exercise prescription.  We will make changes to your prescription with you as you progress through the program.   Duration: Be able to do 30 to 45 minutes of continuous aerobic exercise in addition to a 5 minute warm-up and a 5 minute cool-down routine.   Nutrition Goals: Your personal nutrition goals will be established when you do your nutrition analysis with the  dietician.  The following are general nutrition guidelines to follow: Cholesterol < 200mg /day Sodium < 1500mg /day Fiber: Women over 50 yrs - 21 grams per day  Personal Goals:  Personal Goals and Risk Factors at Admission - 08/13/19 1506      Core Components/Risk Factors/Patient Goals on Admission    Weight Management Yes;Weight Loss;Obesity    Intervention Weight Management: Develop a combined nutrition and exercise program designed to reach desired caloric intake, while maintaining appropriate intake of nutrient and fiber, sodium and fats, and appropriate energy expenditure required for the weight goal.;Weight Management: Provide education and appropriate resources to help participant work on and attain dietary goals.;Weight Management/Obesity: Establish reasonable short term and long term weight goals.;Obesity: Provide education and appropriate resources to help participant work on and attain dietary goals.    Admit Weight 225 lb 12.8 oz (102.4 kg)    Goal Weight: Short Term 220 lb (99.8 kg)    Goal Weight: Long Term 215 lb (97.5 kg)    Expected Outcomes Long Term: Adherence to nutrition and physical activity/exercise program aimed toward attainment of established weight goal;Short Term: Continue to assess and modify interventions until short term weight is achieved;Weight Loss:  Understanding of general recommendations for a balanced deficit meal plan, which promotes 1-2 lb weight loss per week and includes a negative energy balance of [phone] kcal/d;Understanding recommendations for meals to include 15-35% energy as protein, 25-35% energy from fat, 35-60% energy from carbohydrates, less than 200mg  of dietary cholesterol, 20-35 gm of total fiber daily;Understanding of distribution of calorie intake throughout the day with the consumption of 4-5 meals/snacks    Improve shortness of breath with ADL's Yes    Intervention Provide education, individualized exercise plan and daily activity instruction  to help decrease symptoms of SOB with activities of daily living.    Expected Outcomes Short Term: Improve cardiorespiratory fitness to achieve a reduction of symptoms when performing ADLs;Long Term: Be able to perform more ADLs without symptoms or delay the onset of symptoms    Lipids Yes    Intervention Provide education and support for participant on nutrition & aerobic/resistive exercise along with prescribed medications to achieve LDL 70mg , HDL >40mg .    Expected Outcomes Short Term: Participant states understanding of desired cholesterol values and is compliant with medications prescribed. Participant is following exercise prescription and nutrition guidelines.;Long Term: Cholesterol controlled with medications as prescribed, with individualized exercise RX and with personalized nutrition plan. Value goals: LDL < 70mg , HDL > 40 mg.           Tobacco Use Initial Evaluation: Social History   Tobacco Use  Smoking Status Former Smoker  . Packs/day: 1.50  . Years: 50.00  . Pack years: 75.00  . Types: Cigarettes  . Quit date: 04/01/2011  . Years since quitting: 8.3  Smokeless Tobacco Never Used  Tobacco Comment   has stopped but has started again in "crisis"    Exercise Goals and Review:  Exercise Goals    Row Name 08/13/19 1505             Exercise Goals   Increase Physical Activity Yes       Intervention Provide advice, education, support and counseling about physical activity/exercise needs.;Develop an individualized exercise prescription for aerobic and resistive training based on initial evaluation findings, risk stratification, comorbidities and participant's personal goals.       Expected Outcomes Short Term: Attend rehab on a regular basis to increase amount of physical activity.;Long Term: Add in home exercise to make exercise part of routine and to increase amount of physical activity.;Long Term: Exercising regularly at least 3-5 days a week.       Increase Strength and  Stamina Yes       Intervention Provide advice, education, support and counseling about physical activity/exercise needs.;Develop an individualized exercise prescription for aerobic and resistive training based on initial evaluation findings, risk stratification, comorbidities and participant's personal goals.       Expected Outcomes Short Term: Increase workloads from initial exercise prescription for resistance, speed, and METs.;Short Term: Perform resistance training exercises routinely during rehab and add in resistance training at home;Long Term: Improve cardiorespiratory fitness, muscular endurance and strength as measured by increased METs and functional capacity (6MWT)       Able to understand and use rate of perceived exertion (RPE) scale Yes       Intervention Provide education and explanation on how to use RPE scale       Expected Outcomes Short Term: Able to use RPE daily in rehab to express subjective intensity level;Long Term:  Able to use RPE to guide intensity level when exercising independently       Able to understand and  use Dyspnea scale Yes       Intervention Provide education and explanation on how to use Dyspnea scale       Expected Outcomes Short Term: Able to use Dyspnea scale daily in rehab to express subjective sense of shortness of breath during exertion;Long Term: Able to use Dyspnea scale to guide intensity level when exercising independently       Knowledge and understanding of Target Heart Rate Range (THRR) Yes       Intervention Provide education and explanation of THRR including how the numbers were predicted and where they are located for reference       Expected Outcomes Short Term: Able to state/look up THRR;Short Term: Able to use daily as guideline for intensity in rehab;Long Term: Able to use THRR to govern intensity when exercising independently       Able to check pulse independently Yes       Intervention Review the importance of being able to check your own  pulse for safety during independent exercise;Provide education and demonstration on how to check pulse in carotid and radial arteries.       Expected Outcomes Short Term: Able to explain why pulse checking is important during independent exercise;Long Term: Able to check pulse independently and accurately       Understanding of Exercise Prescription Yes       Intervention Provide education, explanation, and written materials on patient's individual exercise prescription       Expected Outcomes Short Term: Able to explain program exercise prescription;Long Term: Able to explain home exercise prescription to exercise independently              Copy of goals given to participant.

## 2019-08-13 NOTE — Progress Notes (Signed)
Pulmonary Individual Treatment Plan  Patient Details  Name: Hannah Green MRN: [id number] Date of Birth: [birth year] Referring Provider:     Pulmonary Rehab from 08/13/2019 in [facility] Cardiac and Pulmonary Rehab  Referring Provider [name] DO      Initial Encounter Date:    Pulmonary Rehab from 08/13/2019 in [facility] Cardiac and Pulmonary Rehab  Date 08/13/19      Visit Diagnosis: Stage 3 severe COPD by GOLD classification ([place])  Patient's Home Medications on Admission:  Current Outpatient Medications:  .  albuterol (VENTOLIN HFA) 108 (90 Base) MCG/ACT inhaler, TAKE 2 PUFFS BY MOUTH EVERY 6 HOURS AS NEEDED FOR WHEEZE OR SHORTNESS OF BREATH, Disp: 8.5 g, Rfl: 3 .  aspirin EC 81 MG tablet, Take 1 tablet (81 mg total) by mouth daily., Disp: 90 tablet, Rfl: 3 .  atorvastatin (LIPITOR) 10 MG tablet, Take 1 tablet (10 mg total) by mouth daily., Disp: 90 tablet, Rfl: 3 .  Budeson-Glycopyrrol-Formoterol (BREZTRI AEROSPHERE) 160-9-4.8 MCG/ACT AERO, Inhale 2 puffs into the lungs in the morning and at bedtime., Disp: 5.9 g, Rfl: 0 .  Budeson-Glycopyrrol-Formoterol (BREZTRI AEROSPHERE) 160-9-4.8 MCG/ACT AERO, Inhale 2 puffs into the lungs 2 (two) times daily., Disp: 10.7 g, Rfl: 2 .  cholecalciferol (VITAMIN D3) 25 MCG (1000 UT) tablet, Take 1,000 Units by mouth daily., Disp: , Rfl:  .  predniSONE (DELTASONE) 10 MG tablet, Take 3 tabs for 2 days, 2 tabs for 2 days, then 1 tab for 2 days, then stop., Disp: 12 tablet, Rfl: 0 No current facility-administered medications for this visit.  Facility-Administered Medications Ordered in Other Visits:  .  iohexol (OMNIPAQUE) 350 MG/ML injection 80 mL, 80 mL, Intravenous, Once PRN, Magdalen Spatz, NP  Past Medical History: Past Medical History:  Diagnosis Date  . Breast cancer ([place]) 1999  . Cataract 2017   resolved with surgery  . COPD (chronic obstructive pulmonary disease) ([place])   . Coronary artery disease   . GERD (gastroesophageal reflux  disease)   . Hyperlipidemia   . Postoperative atrial fibrillation ([place]) 03/2018  . Rheumatoid arthritis(714.0)   . Squamous cell carcinoma of lung, stage I, right ([place]) 2020   Status post right upper and middle lobectomy    Tobacco Use: Social History   Tobacco Use  Smoking Status Former Smoker  . Packs/day: 1.50  . Years: 50.00  . Pack years: 75.00  . Types: Cigarettes  . Quit date: 04/01/2011  . Years since quitting: 8.3  Smokeless Tobacco Never Used  Tobacco Comment   has stopped but has started again in "crisis"    Labs: Recent Review Flowsheet Data    Labs for ITP Cardiac and Pulmonary Rehab Latest Ref Rng & Units 04/10/2018 04/19/2018 04/23/2018 04/24/2018 12/11/2018   Cholestrol 0 - 200 mg/dL - - - - 218(H)   LDLCALC 0 - 99 mg/dL - - - - 127(H)   LDLDIRECT mg/dL - - - - -   HDL >39.00 mg/dL - - - - 57.90   Trlycerides 0 - 149 mg/dL - - - - 165.0(H)   Hemoglobin A1c 4.6 - 6.5 % - - - - 5.9   PHART 7.35 - 7.45 - TEST WILL BE CREDITED 7.425 7.354 -   PCO2ART 32 - 48 mmHg - TEST WILL BE CREDITED 41.1 48.4(H) -   HCO3 20.0 - 28.0 mmol/L 29.0(H) TEST WILL BE CREDITED 26.5 26.3 -   TCO2 22 - 32 mmol/L 31 - - - -   ACIDBASEDEF 0.0 - 2.0  mmol/L - TEST WILL BE CREDITED - - -   O2SAT % 76.0 TEST WILL BE CREDITED 97.7 98.6 -       Pulmonary Assessment Scores:  Pulmonary Assessment Scores    Row Name 08/13/19 1507         ADL UCSD   ADL Phase Entry     SOB Score total 57     Rest 1     Walk 3     Stairs 4     Bath 0     Dress 0     Shop 4       CAT Score   CAT Score 21       mMRC Score   mMRC Score 3            UCSD: Self-administered rating of dyspnea associated with activities of daily living (ADLs) 6-point scale (0 = "not at all" to 5 = "maximal or unable to do because of breathlessness")  Scoring Scores range from 0 to 120.  Minimally important difference is 5 units  CAT: CAT can identify the health impairment of COPD patients and is better correlated  with disease progression.  CAT has a scoring range of zero to 40. The CAT score is classified into four groups of low (less than 10), medium (10 - 20), high (21-30) and very high (31-40) based on the impact level of disease on health status. A CAT score over 10 suggests significant symptoms.  A worsening CAT score could be explained by an exacerbation, poor medication adherence, poor inhaler technique, or progression of COPD or comorbid conditions.  CAT MCID is 2 points  mMRC: mMRC (Modified Medical Research Council) Dyspnea Scale is used to assess the degree of baseline functional disability in patients of respiratory disease due to dyspnea. No minimal important difference is established. A decrease in score of 1 point or greater is considered a positive change.   Pulmonary Function Assessment:  Pulmonary Function Assessment - 08/07/19 1410      Breath   Shortness of Breath Yes;Limiting activity           Exercise Target Goals: Exercise Program Goal: Individual exercise prescription set using results from initial 6 min walk test and THRR while considering  patient's activity barriers and safety.   Exercise Prescription Goal: Initial exercise prescription builds to 30-45 minutes a day of aerobic activity, 2-3 days per week.  Home exercise guidelines will be given to patient during program as part of exercise prescription that the participant will acknowledge.  Education: Aerobic Exercise & Resistance Training: - Gives group verbal and written instruction on the various components of exercise. Focuses on aerobic and resistive training programs and the benefits of this training and how to safely progress through these programs..   Education: Exercise & Equipment Safety: - Individual verbal instruction and demonstration of equipment use and safety with use of the equipment.   Pulmonary Rehab from 08/13/2019 in [facility] Cardiac and Pulmonary Rehab  Date 08/13/19  Educator [facility]  Instruction  Review Code 1- Verbalizes Understanding      Education: Exercise Physiology & General Exercise Guidelines: - Group verbal and written instruction with models to review the exercise physiology of the cardiovascular system and associated critical values. Provides general exercise guidelines with specific guidelines to those with heart or lung disease.    Education: Flexibility, Balance, Mind/Body Relaxation: Provides group verbal/written instruction on the benefits of flexibility and balance training, including mind/body exercise modes such as yoga, pilates and  tai chi.  Demonstration and skill practice provided.   Activity Barriers & Risk Stratification:  Activity Barriers & Cardiac Risk Stratification - 08/13/19 1501      Activity Barriers & Cardiac Risk Stratification   Activity Barriers Deconditioning;Joint Problems;Muscular Weakness;Shortness of Breath;Balance Concerns   r knee pain          6 Minute Walk:  6 Minute Walk    Row Name 08/13/19 1459         6 Minute Walk   Phase Initial     Distance 1035 feet     Walk Time 5 minutes     # of Rest Breaks 1  1 min     MPH 2.35     METS 2.32     RPE 13     Perceived Dyspnea  3     VO2 Peak 8.11     Symptoms Yes (comment)     Comments SOB, knee pain (5/10)     Resting HR 70 bpm     Resting BP 132/74     Resting Oxygen Saturation  98 %     Exercise Oxygen Saturation  during 6 min walk 94 %     Max Ex. HR 108 bpm     Max Ex. BP 204/70     2 Minute Post BP 160/80       Interval HR   1 Minute HR 94     2 Minute HR 108     3 Minute HR 102     4 Minute HR 102  seated     5 Minute HR 79     6 Minute HR 100     2 Minute Post HR 71     Interval Heart Rate? Yes       Interval Oxygen   Interval Oxygen? Yes     Baseline Oxygen Saturation % 98 %     1 Minute Oxygen Saturation % 97 %     1 Minute Liters of Oxygen 0 L  Room Air     2 Minute Oxygen Saturation % 94 %     2 Minute Liters of Oxygen 0 L     3 Minute Oxygen  Saturation % 96 %     3 Minute Liters of Oxygen 0 L     4 Minute Oxygen Saturation % 95 %     4 Minute Liters of Oxygen 0 L     5 Minute Oxygen Saturation % 97 %     5 Minute Liters of Oxygen 0 L     6 Minute Oxygen Saturation % 96 %     6 Minute Liters of Oxygen 0 L     2 Minute Post Oxygen Saturation % 98 %     2 Minute Post Liters of Oxygen 0 L           Oxygen Initial Assessment:  Oxygen Initial Assessment - 08/07/19 1409      Home Oxygen   Home Oxygen Device None    Sleep Oxygen Prescription None    Home Exercise Oxygen Prescription None    Home at Rest Exercise Oxygen Prescription None    Compliance with Home Oxygen Use Yes      Initial 6 min Walk   Oxygen Used None      Program Oxygen Prescription   Program Oxygen Prescription None      Intervention   Short Term Goals To learn and exhibit compliance with exercise, home  and travel O2 prescription;To learn and understand importance of monitoring SPO2 with pulse oximeter and demonstrate accurate use of the pulse oximeter.;To learn and understand importance of maintaining oxygen saturations>88%;To learn and demonstrate proper pursed lip breathing techniques or other breathing techniques.;To learn and demonstrate proper use of respiratory medications    Long  Term Goals Exhibits compliance with exercise, home and travel O2 prescription;Verbalizes importance of monitoring SPO2 with pulse oximeter and return demonstration;Maintenance of O2 saturations>88%;Exhibits proper breathing techniques, such as pursed lip breathing or other method taught during program session;Compliance with respiratory medication;Demonstrates proper use of MDI's           Oxygen Re-Evaluation:   Oxygen Discharge (Final Oxygen Re-Evaluation):   Initial Exercise Prescription:  Initial Exercise Prescription - 08/13/19 1500      Date of Initial Exercise RX and Referring Provider   Date 08/13/19    Referring Provider [name] DO       Treadmill   MPH 1.7    Grade 0.5    Minutes 15    METs 2.42      NuStep   Level 1    SPM 80    Minutes 15    METs 2      REL-XR   Level 1    Speed 50    Minutes 15    METs 2      Prescription Details   Frequency (times per week) 2    Duration Progress to 30 minutes of continuous aerobic without signs/symptoms of physical distress      Intensity   THRR 40-80% of Max Heartrate 100-131    Ratings of Perceived Exertion 11-13    Perceived Dyspnea 0-4      Progression   Progression Continue to progress workloads to maintain intensity without signs/symptoms of physical distress.      Resistance Training   Training Prescription Yes    Weight 3 lb    Reps 10-15           Perform Capillary Blood Glucose checks as needed.  Exercise Prescription Changes:  Exercise Prescription Changes    Row Name 08/13/19 1500             Response to Exercise   Blood Pressure (Admit) 132/74       Blood Pressure (Exercise) 204/70  rck 168/80       Blood Pressure (Exit) 126/70       Heart Rate (Admit) 70 bpm       Heart Rate (Exercise) 108 bpm       Heart Rate (Exit) 78 bpm       Oxygen Saturation (Admit) 98 %       Oxygen Saturation (Exercise) 94 %       Oxygen Saturation (Exit) 94 %       Rating of Perceived Exertion (Exercise) 13       Perceived Dyspnea (Exercise) 3       Symptoms SOB, R knee pain 5/10       Comments walk test results              Exercise Comments:   Exercise Goals and Review:  Exercise Goals    Row Name 08/13/19 1505             Exercise Goals   Increase Physical Activity Yes       Intervention Provide advice, education, support and counseling about physical activity/exercise needs.;Develop an individualized exercise prescription for aerobic and resistive training based on  initial evaluation findings, risk stratification, comorbidities and participant's personal goals.       Expected Outcomes Short Term: Attend rehab on a regular basis to  increase amount of physical activity.;Long Term: Add in home exercise to make exercise part of routine and to increase amount of physical activity.;Long Term: Exercising regularly at least 3-5 days a week.       Increase Strength and Stamina Yes       Intervention Provide advice, education, support and counseling about physical activity/exercise needs.;Develop an individualized exercise prescription for aerobic and resistive training based on initial evaluation findings, risk stratification, comorbidities and participant's personal goals.       Expected Outcomes Short Term: Increase workloads from initial exercise prescription for resistance, speed, and METs.;Short Term: Perform resistance training exercises routinely during rehab and add in resistance training at home;Long Term: Improve cardiorespiratory fitness, muscular endurance and strength as measured by increased METs and functional capacity (6MWT)       Able to understand and use rate of perceived exertion (RPE) scale Yes       Intervention Provide education and explanation on how to use RPE scale       Expected Outcomes Short Term: Able to use RPE daily in rehab to express subjective intensity level;Long Term:  Able to use RPE to guide intensity level when exercising independently       Able to understand and use Dyspnea scale Yes       Intervention Provide education and explanation on how to use Dyspnea scale       Expected Outcomes Short Term: Able to use Dyspnea scale daily in rehab to express subjective sense of shortness of breath during exertion;Long Term: Able to use Dyspnea scale to guide intensity level when exercising independently       Knowledge and understanding of Target Heart Rate Range (THRR) Yes       Intervention Provide education and explanation of THRR including how the numbers were predicted and where they are located for reference       Expected Outcomes Short Term: Able to state/look up THRR;Short Term: Able to use daily  as guideline for intensity in rehab;Long Term: Able to use THRR to govern intensity when exercising independently       Able to check pulse independently Yes       Intervention Review the importance of being able to check your own pulse for safety during independent exercise;Provide education and demonstration on how to check pulse in carotid and radial arteries.       Expected Outcomes Short Term: Able to explain why pulse checking is important during independent exercise;Long Term: Able to check pulse independently and accurately       Understanding of Exercise Prescription Yes       Intervention Provide education, explanation, and written materials on patient's individual exercise prescription       Expected Outcomes Short Term: Able to explain program exercise prescription;Long Term: Able to explain home exercise prescription to exercise independently              Exercise Goals Re-Evaluation :   Discharge Exercise Prescription (Final Exercise Prescription Changes):  Exercise Prescription Changes - 08/13/19 1500      Response to Exercise   Blood Pressure (Admit) 132/74    Blood Pressure (Exercise) 204/70   rck 168/80   Blood Pressure (Exit) 126/70    Heart Rate (Admit) 70 bpm    Heart Rate (Exercise) 108 bpm    Heart  Rate (Exit) 78 bpm    Oxygen Saturation (Admit) 98 %    Oxygen Saturation (Exercise) 94 %    Oxygen Saturation (Exit) 94 %    Rating of Perceived Exertion (Exercise) 13    Perceived Dyspnea (Exercise) 3    Symptoms SOB, R knee pain 5/10    Comments walk test results           Nutrition:  Target Goals: Understanding of nutrition guidelines, daily intake of sodium '1500mg'$ , cholesterol '200mg'$ , calories 30% from fat and 7% or less from saturated fats, daily to have 5 or more servings of fruits and vegetables.  Education: Controlling Sodium/Reading Food Labels -Group verbal and written material supporting the discussion of sodium use in heart healthy nutrition.  Review and explanation with models, verbal and written materials for utilization of the food label.   Education: General Nutrition Guidelines/Fats and Fiber: -Group instruction provided by verbal, written material, models and posters to present the general guidelines for heart healthy nutrition. Gives an explanation and review of dietary fats and fiber.   Biometrics:  Pre Biometrics - 08/13/19 1505      Pre Biometrics   Height 5' 6.75" (1.695 m)    Weight 225 lb 12.8 oz (102.4 kg)    BMI (Calculated) 35.65    Single Leg Stand 10.94 seconds            Nutrition Therapy Plan and Nutrition Goals:   Nutrition Assessments:  Nutrition Assessments - 08/13/19 1506      MEDFICTS Scores   Pre Score 49           MEDIFICTS Score Key:          ?70 Need to make dietary changes          40-70 Heart Healthy Diet         ? 40 Therapeutic Level Cholesterol Diet  Nutrition Goals Re-Evaluation:   Nutrition Goals Discharge (Final Nutrition Goals Re-Evaluation):   Psychosocial: Target Goals: Acknowledge presence or absence of significant depression and/or stress, maximize coping skills, provide positive support system. Participant is able to verbalize types and ability to use techniques and skills needed for reducing stress and depression.   Education: Depression - Provides group verbal and written instruction on the correlation between heart/lung disease and depressed mood, treatment options, and the stigmas associated with seeking treatment.   Education: Sleep Hygiene -Provides group verbal and written instruction about how sleep can affect your health.  Define sleep hygiene, discuss sleep cycles and impact of sleep habits. Review good sleep hygiene tips.    Education: Stress and Anxiety: - Provides group verbal and written instruction about the health risks of elevated stress and causes of high stress.  Discuss the correlation between heart/lung disease and anxiety and treatment  options. Review healthy ways to manage with stress and anxiety.   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      Family Dynamics   Good Support System? Yes    Comments She can look to her son for support. Patient is not able to sleep well.      Barriers   Psychosocial barriers to participate  in program The patient should benefit from training in stress management and relaxation.      Screening Interventions   Interventions Encouraged to exercise;To provide support and resources with identified psychosocial needs;Provide feedback about the scores to participant    Expected Outcomes Short Term goal: Utilizing psychosocial counselor, staff and physician to assist with identification of specific Stressors or current issues interfering with healing process. Setting desired goal for each stressor or current issue identified.;Long Term Goal: Stressors or current issues are controlled or eliminated.;Short Term goal: Identification and review with participant of any Quality of Life or Depression concerns found by scoring the questionnaire.;Long Term goal: The participant improves quality of Life and PHQ9 Scores as seen by post scores and/or verbalization of changes           Quality of Life Scores:  Scores of 19 and below usually indicate a poorer quality of life in these areas.  A difference of  2-3 points is a clinically meaningful difference.  A difference of 2-3 points in the total score of the Quality of Life Index has been associated with significant improvement in overall quality of life, self-image, physical symptoms, and general health in studies assessing  change in quality of life.  PHQ-9: Recent Review Flowsheet Data    Depression screen [facility] 2/9 08/13/2019 12/11/2018 12/05/2017 11/24/2016 11/24/2015   Decreased Interest 0 0 0 1 0   Down, Depressed, Hopeless 0 0 1 0 0   PHQ - 2 Score 0 0 1 1 0   Altered sleeping 1 0 2 0 -   Tired, decreased energy 2 0 1 2 -   Change in appetite 2 0 2 2 -   Feeling bad or failure about yourself  0 0 1 1 -   Trouble concentrating 0 0 0 0 -   Moving slowly or fidgety/restless 0 0 0 0 -   Suicidal thoughts 0 0 0 0 -   PHQ-9 Score 5 0 7 6 -   Difficult doing work/chores Somewhat difficult Not difficult at all Not difficult at all Not difficult at all -     Interpretation of Total Score  Total Score Depression Severity:  1-4 = Minimal depression, 5-9 = Mild depression, 10-14 = Moderate depression, 15-19 = Moderately severe depression, 20-27 = Severe depression   Psychosocial Evaluation and Intervention:  Psychosocial Evaluation - 08/07/19 1405      Psychosocial Evaluation & Interventions   Interventions Encouraged to exercise with the program and follow exercise prescription    Comments 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 She can look to her son for support. Patient is not able to sleep well.    Expected Outcomes Short: Continue to exercise regularly to support mental health and notify staff of any changes. Long: maintain mental health and well being through teaching of rehab or prescribed medications independently.    Continue Psychosocial Services  Follow up required by staff           Psychosocial Re-Evaluation:   Psychosocial Discharge (Final Psychosocial Re-Evaluation):   Education: Education Goals: Education classes will be provided on a weekly basis, covering required topics. Participant will state understanding/return demonstration of topics presented.  Learning Barriers/Preferences:  Learning Barriers/Preferences - 08/07/19 1403      Learning  Barriers/Preferences   Learning Barriers None    Learning Preferences None           General Pulmonary Education Topics:  Infection Prevention: -  Provides verbal and written material to individual with discussion of infection control including proper hand washing and proper equipment cleaning during exercise session.   Pulmonary Rehab from 08/13/2019 in [facility] Cardiac and Pulmonary Rehab  Date 08/13/19  Educator [facility]  Instruction Review Code 1- Verbalizes Understanding      Falls Prevention: - Provides verbal and written material to individual with discussion of falls prevention and safety.   Pulmonary Rehab from 08/13/2019 in [facility] Cardiac and Pulmonary Rehab  Date 08/13/19  Educator [facility]  Instruction Review Code 1- Verbalizes Understanding      Chronic Lung Diseases: - Group verbal and written instruction to review updates, respiratory medications, advancements in procedures and treatments. Discuss use of supplemental oxygen including available portable oxygen systems, continuous and intermittent flow rates, concentrators, personal use and safety guidelines. Review proper use of inhaler and spacers. Provide informative websites for self-education.    Energy Conservation: - Provide group verbal and written instruction for methods to conserve energy, plan and organize activities. Instruct on pacing techniques, use of adaptive equipment and posture/positioning to relieve shortness of breath.   Triggers and Exacerbations: - Group verbal and written instruction to review types of environmental triggers and ways to prevent exacerbations. Discuss weather changes, air quality and the benefits of nasal washing. Review warning signs and symptoms to help prevent infections. Discuss techniques for effective airway clearance, coughing, and vibrations.   AED/CPR: - Group verbal and written instruction with the use of models to demonstrate the basic use of the AED with the basic ABC's of  resuscitation.   Anatomy and Physiology of the Lungs: - Group verbal and written instruction with the use of models to provide basic lung anatomy and physiology related to function, structure and complications of lung disease.   Anatomy & Physiology of the Heart: - Group verbal and written instruction and models provide basic cardiac anatomy and physiology, with the coronary electrical and arterial systems. Review of Valvular disease and Heart Failure   Cardiac Medications: - Group verbal and written instruction to review commonly prescribed medications for heart disease. Reviews the medication, class of the drug, and side effects.   Other: -Provides group and verbal instruction on various topics (see comments)   Knowledge Questionnaire Score:  Knowledge Questionnaire Score - 08/13/19 1506      Knowledge Questionnaire Score   Pre Score 11/18 Education Focus; Pulm A&P, O2 safety            Core Components/Risk Factors/Patient Goals at Admission:  Personal Goals and Risk Factors at Admission - 08/13/19 1506      Core Components/Risk Factors/Patient Goals on Admission    Weight Management Yes;Weight Loss;Obesity    Intervention Weight Management: Develop a combined nutrition and exercise program designed to reach desired caloric intake, while maintaining appropriate intake of nutrient and fiber, sodium and fats, and appropriate energy expenditure required for the weight goal.;Weight Management: Provide education and appropriate resources to help participant work on and attain dietary goals.;Weight Management/Obesity: Establish reasonable short term and long term weight goals.;Obesity: Provide education and appropriate resources to help participant work on and attain dietary goals.    Admit Weight 225 lb 12.8 oz (102.4 kg)    Goal Weight: Short Term 220 lb (99.8 kg)    Goal Weight: Long Term 215 lb (97.5 kg)    Expected Outcomes Long Term: Adherence to nutrition and physical  activity/exercise program aimed toward attainment of established weight goal;Short Term: Continue to assess and modify interventions until short term  weight is achieved;Weight Loss: Understanding of general recommendations for a balanced deficit meal plan, which promotes 1-2 lb weight loss per week and includes a negative energy balance of [phone] kcal/d;Understanding recommendations for meals to include 15-35% energy as protein, 25-35% energy from fat, 35-60% energy from carbohydrates, less than 224m of dietary cholesterol, 20-35 gm of total fiber daily;Understanding of distribution of calorie intake throughout the day with the consumption of 4-5 meals/snacks    Improve shortness of breath with ADL's Yes    Intervention Provide education, individualized exercise plan and daily activity instruction to help decrease symptoms of SOB with activities of daily living.    Expected Outcomes Short Term: Improve cardiorespiratory fitness to achieve a reduction of symptoms when performing ADLs;Long Term: Be able to perform more ADLs without symptoms or delay the onset of symptoms    Lipids Yes    Intervention Provide education and support for participant on nutrition & aerobic/resistive exercise along with prescribed medications to achieve LDL <722m HDL >4080m   Expected Outcomes Short Term: Participant states understanding of desired cholesterol values and is compliant with medications prescribed. Participant is following exercise prescription and nutrition guidelines.;Long Term: Cholesterol controlled with medications as prescribed, with individualized exercise RX and with personalized nutrition plan. Value goals: LDL < 27m59mDL > 40 mg.           Education:Diabetes - Individual verbal and written instruction to review signs/symptoms of diabetes, desired ranges of glucose level fasting, after meals and with exercise. Acknowledge that pre and post exercise glucose checks will be done for 3 sessions at entry  of program.   Education: Know Your Numbers and Risk Factors: -Group verbal and written instruction about important numbers in your health.  Discussion of what are risk factors and how they play a role in the disease process.  Review of Cholesterol, Blood Pressure, Diabetes, and BMI and the role they play in your overall health.   Core Components/Risk Factors/Patient Goals Review:    Core Components/Risk Factors/Patient Goals at Discharge (Final Review):    ITP Comments:  ITP Comments    Row Name 08/07/19 1413 08/13/19 1458         ITP Comments Virtual Visit completed. Patient informed on EP and RD appointment and 6 Minute walk test. Patient also informed of patient health questionnaires on My Chart. Patient Verbalizes understanding. Visit diagnosis can be found in [facility]/2021. Completed 6MWT and gym orientation.  Initial ITP created and sent for review to Dr. MarkEmily Filbertdical Director.             Comments: Initial ITP

## 2019-08-14 ENCOUNTER — Encounter: Payer: Self-pay | Admitting: *Deleted

## 2019-08-14 DIAGNOSIS — J449 Chronic obstructive pulmonary disease, unspecified: Secondary | ICD-10-CM

## 2019-08-14 NOTE — Progress Notes (Signed)
Pulmonary Individual Treatment Plan  Patient Details  Name: NAHAL WANLESS MRN: [id number] Date of Birth: 17-Oct-1945 Referring Provider:     Pulmonary Rehab from 08/13/2019 in [facility] Cardiac and Pulmonary Rehab  Referring Provider [name] DO      Initial Encounter Date:    Pulmonary Rehab from 08/13/2019 in [facility] Cardiac and Pulmonary Rehab  Date 08/13/19      Visit Diagnosis: Stage 3 severe COPD by GOLD classification ([place])  Patient's Home Medications on Admission:  Current Outpatient Medications:  .  albuterol (VENTOLIN HFA) 108 (90 Base) MCG/ACT inhaler, TAKE 2 PUFFS BY MOUTH EVERY 6 HOURS AS NEEDED FOR WHEEZE OR SHORTNESS OF BREATH, Disp: 8.5 g, Rfl: 3 .  aspirin EC 81 MG tablet, Take 1 tablet (81 mg total) by mouth daily., Disp: 90 tablet, Rfl: 3 .  atorvastatin (LIPITOR) 10 MG tablet, Take 1 tablet (10 mg total) by mouth daily., Disp: 90 tablet, Rfl: 3 .  Budeson-Glycopyrrol-Formoterol (BREZTRI AEROSPHERE) 160-9-4.8 MCG/ACT AERO, Inhale 2 puffs into the lungs in the morning and at bedtime., Disp: 5.9 g, Rfl: 0 .  Budeson-Glycopyrrol-Formoterol (BREZTRI AEROSPHERE) 160-9-4.8 MCG/ACT AERO, Inhale 2 puffs into the lungs 2 (two) times daily., Disp: 10.7 g, Rfl: 2 .  cholecalciferol (VITAMIN D3) 25 MCG (1000 UT) tablet, Take 1,000 Units by mouth daily., Disp: , Rfl:  .  predniSONE (DELTASONE) 10 MG tablet, Take 3 tabs for 2 days, 2 tabs for 2 days, then 1 tab for 2 days, then stop., Disp: 12 tablet, Rfl: 0  Past Medical History: Past Medical History:  Diagnosis Date  . Breast cancer ([place]) 1999  . Cataract 2017   resolved with surgery  . COPD (chronic obstructive pulmonary disease) ([place])   . Coronary artery disease   . GERD (gastroesophageal reflux disease)   . Hyperlipidemia   . Postoperative atrial fibrillation ([place]) 03/2018  . Rheumatoid arthritis(714.0)   . Squamous cell carcinoma of lung, stage I, right ([place]) 2020   Status post right upper and middle lobectomy     Tobacco Use: Social History   Tobacco Use  Smoking Status Former Smoker  . Packs/day: 1.50  . Years: 50.00  . Pack years: 75.00  . Types: Cigarettes  . Quit date: 04/01/2011  . Years since quitting: 8.3  Smokeless Tobacco Never Used  Tobacco Comment   has stopped but has started again in "crisis"    Labs: Recent Review Flowsheet Data    Labs for ITP Cardiac and Pulmonary Rehab Latest Ref Rng & Units 04/10/2018 04/19/2018 04/23/2018 04/24/2018 12/11/2018   Cholestrol 0 - 200 mg/dL - - - - 218(H)   LDLCALC 0 - 99 mg/dL - - - - 127(H)   LDLDIRECT mg/dL - - - - -   HDL >39.00 mg/dL - - - - 57.90   Trlycerides 0 - 149 mg/dL - - - - 165.0(H)   Hemoglobin A1c 4.6 - 6.5 % - - - - 5.9   PHART 7.35 - 7.45 - TEST WILL BE CREDITED 7.425 7.354 -   PCO2ART 32 - 48 mmHg - TEST WILL BE CREDITED 41.1 48.4(H) -   HCO3 20.0 - 28.0 mmol/L 29.0(H) TEST WILL BE CREDITED 26.5 26.3 -   TCO2 22 - 32 mmol/L 31 - - - -   ACIDBASEDEF 0.0 - 2.0 mmol/L - TEST WILL BE CREDITED - - -   O2SAT % 76.0 TEST WILL BE CREDITED 97.7 98.6 -       Pulmonary Assessment Scores:  Pulmonary Assessment  Scores    Row Name 08/13/19 1507         ADL UCSD   ADL Phase Entry     SOB Score total 57     Rest 1     Walk 3     Stairs 4     Bath 0     Dress 0     Shop 4       CAT Score   CAT Score 21       mMRC Score   mMRC Score 3            UCSD: Self-administered rating of dyspnea associated with activities of daily living (ADLs) 6-point scale (0 = "not at all" to 5 = "maximal or unable to do because of breathlessness")  Scoring Scores range from 0 to 120.  Minimally important difference is 5 units  CAT: CAT can identify the health impairment of COPD patients and is better correlated with disease progression.  CAT has a scoring range of zero to 40. The CAT score is classified into four groups of low (less than 10), medium (10 - 20), high (21-30) and very high (31-40) based on the impact level of disease  on health status. A CAT score over 10 suggests significant symptoms.  A worsening CAT score could be explained by an exacerbation, poor medication adherence, poor inhaler technique, or progression of COPD or comorbid conditions.  CAT MCID is 2 points  mMRC: mMRC (Modified Medical Research Council) Dyspnea Scale is used to assess the degree of baseline functional disability in patients of respiratory disease due to dyspnea. No minimal important difference is established. A decrease in score of 1 point or greater is considered a positive change.   Pulmonary Function Assessment:  Pulmonary Function Assessment - 08/07/19 1410      Breath   Shortness of Breath Yes;Limiting activity           Exercise Target Goals: Exercise Program Goal: Individual exercise prescription set using results from initial 6 min walk test and THRR while considering  patient's activity barriers and safety.   Exercise Prescription Goal: Initial exercise prescription builds to 30-45 minutes a day of aerobic activity, 2-3 days per week.  Home exercise guidelines will be given to patient during program as part of exercise prescription that the participant will acknowledge.  Education: Aerobic Exercise & Resistance Training: - Gives group verbal and written instruction on the various components of exercise. Focuses on aerobic and resistive training programs and the benefits of this training and how to safely progress through these programs..   Education: Exercise & Equipment Safety: - Individual verbal instruction and demonstration of equipment use and safety with use of the equipment.   Pulmonary Rehab from 08/13/2019 in [facility] Cardiac and Pulmonary Rehab  Date 08/13/19  Educator [facility]  Instruction Review Code 1- Verbalizes Understanding      Education: Exercise Physiology & General Exercise Guidelines: - Group verbal and written instruction with models to review the exercise physiology of the cardiovascular system  and associated critical values. Provides general exercise guidelines with specific guidelines to those with heart or lung disease.    Education: Flexibility, Balance, Mind/Body Relaxation: Provides group verbal/written instruction on the benefits of flexibility and balance training, including mind/body exercise modes such as yoga, pilates and tai chi.  Demonstration and skill practice provided.   Activity Barriers & Risk Stratification:  Activity Barriers & Cardiac Risk Stratification - 08/13/19 1501      Activity Barriers &  Cardiac Risk Stratification   Activity Barriers Deconditioning;Joint Problems;Muscular Weakness;Shortness of Breath;Balance Concerns   r knee pain          6 Minute Walk:  6 Minute Walk    Row Name 08/13/19 1459         6 Minute Walk   Phase Initial     Distance 1035 feet     Walk Time 5 minutes     # of Rest Breaks 1  1 min     MPH 2.35     METS 2.32     RPE 13     Perceived Dyspnea  3     VO2 Peak 8.11     Symptoms Yes (comment)     Comments SOB, knee pain (5/10)     Resting HR 70 bpm     Resting BP 132/74     Resting Oxygen Saturation  98 %     Exercise Oxygen Saturation  during 6 min walk 94 %     Max Ex. HR 108 bpm     Max Ex. BP 204/70     2 Minute Post BP 160/80       Interval HR   1 Minute HR 94     2 Minute HR 108     3 Minute HR 102     4 Minute HR 102  seated     5 Minute HR 79     6 Minute HR 100     2 Minute Post HR 71     Interval Heart Rate? Yes       Interval Oxygen   Interval Oxygen? Yes     Baseline Oxygen Saturation % 98 %     1 Minute Oxygen Saturation % 97 %     1 Minute Liters of Oxygen 0 L  Room Air     2 Minute Oxygen Saturation % 94 %     2 Minute Liters of Oxygen 0 L     3 Minute Oxygen Saturation % 96 %     3 Minute Liters of Oxygen 0 L     4 Minute Oxygen Saturation % 95 %     4 Minute Liters of Oxygen 0 L     5 Minute Oxygen Saturation % 97 %     5 Minute Liters of Oxygen 0 L     6 Minute Oxygen  Saturation % 96 %     6 Minute Liters of Oxygen 0 L     2 Minute Post Oxygen Saturation % 98 %     2 Minute Post Liters of Oxygen 0 L           Oxygen Initial Assessment:  Oxygen Initial Assessment - 08/07/19 1409      Home Oxygen   Home Oxygen Device None    Sleep Oxygen Prescription None    Home Exercise Oxygen Prescription None    Home at Rest Exercise Oxygen Prescription None    Compliance with Home Oxygen Use Yes      Initial 6 min Walk   Oxygen Used None      Program Oxygen Prescription   Program Oxygen Prescription None      Intervention   Short Term Goals To learn and exhibit compliance with exercise, home and travel O2 prescription;To learn and understand importance of monitoring SPO2 with pulse oximeter and demonstrate accurate use of the pulse oximeter.;To learn and understand importance of maintaining oxygen saturations>88%;To learn and demonstrate  proper pursed lip breathing techniques or other breathing techniques.;To learn and demonstrate proper use of respiratory medications    Long  Term Goals Exhibits compliance with exercise, home and travel O2 prescription;Verbalizes importance of monitoring SPO2 with pulse oximeter and return demonstration;Maintenance of O2 saturations>88%;Exhibits proper breathing techniques, such as pursed lip breathing or other method taught during program session;Compliance with respiratory medication;Demonstrates proper use of MDI's           Oxygen Re-Evaluation:   Oxygen Discharge (Final Oxygen Re-Evaluation):   Initial Exercise Prescription:  Initial Exercise Prescription - 08/13/19 1500      Date of Initial Exercise RX and Referring Provider   Date 08/13/19    Referring Provider [name] DO      Treadmill   MPH 1.7    Grade 0.5    Minutes 15    METs 2.42      NuStep   Level 1    SPM 80    Minutes 15    METs 2      REL-XR   Level 1    Speed 50    Minutes 15    METs 2      Prescription Details    Frequency (times per week) 2    Duration Progress to 30 minutes of continuous aerobic without signs/symptoms of physical distress      Intensity   THRR 40-80% of Max Heartrate 100-131    Ratings of Perceived Exertion 11-13    Perceived Dyspnea 0-4      Progression   Progression Continue to progress workloads to maintain intensity without signs/symptoms of physical distress.      Resistance Training   Training Prescription Yes    Weight 3 lb    Reps 10-15           Perform Capillary Blood Glucose checks as needed.  Exercise Prescription Changes:  Exercise Prescription Changes    Row Name 08/13/19 1500             Response to Exercise   Blood Pressure (Admit) 132/74       Blood Pressure (Exercise) 204/70  rck 168/80       Blood Pressure (Exit) 126/70       Heart Rate (Admit) 70 bpm       Heart Rate (Exercise) 108 bpm       Heart Rate (Exit) 78 bpm       Oxygen Saturation (Admit) 98 %       Oxygen Saturation (Exercise) 94 %       Oxygen Saturation (Exit) 94 %       Rating of Perceived Exertion (Exercise) 13       Perceived Dyspnea (Exercise) 3       Symptoms SOB, R knee pain 5/10       Comments walk test results              Exercise Comments:   Exercise Goals and Review:  Exercise Goals    Row Name 08/13/19 1505             Exercise Goals   Increase Physical Activity Yes       Intervention Provide advice, education, support and counseling about physical activity/exercise needs.;Develop an individualized exercise prescription for aerobic and resistive training based on initial evaluation findings, risk stratification, comorbidities and participant's personal goals.       Expected Outcomes Short Term: Attend rehab on a regular basis to increase amount of physical activity.;Long Term:  Add in home exercise to make exercise part of routine and to increase amount of physical activity.;Long Term: Exercising regularly at least 3-5 days a week.       Increase  Strength and Stamina Yes       Intervention Provide advice, education, support and counseling about physical activity/exercise needs.;Develop an individualized exercise prescription for aerobic and resistive training based on initial evaluation findings, risk stratification, comorbidities and participant's personal goals.       Expected Outcomes Short Term: Increase workloads from initial exercise prescription for resistance, speed, and METs.;Short Term: Perform resistance training exercises routinely during rehab and add in resistance training at home;Long Term: Improve cardiorespiratory fitness, muscular endurance and strength as measured by increased METs and functional capacity (6MWT)       Able to understand and use rate of perceived exertion (RPE) scale Yes       Intervention Provide education and explanation on how to use RPE scale       Expected Outcomes Short Term: Able to use RPE daily in rehab to express subjective intensity level;Long Term:  Able to use RPE to guide intensity level when exercising independently       Able to understand and use Dyspnea scale Yes       Intervention Provide education and explanation on how to use Dyspnea scale       Expected Outcomes Short Term: Able to use Dyspnea scale daily in rehab to express subjective sense of shortness of breath during exertion;Long Term: Able to use Dyspnea scale to guide intensity level when exercising independently       Knowledge and understanding of Target Heart Rate Range (THRR) Yes       Intervention Provide education and explanation of THRR including how the numbers were predicted and where they are located for reference       Expected Outcomes Short Term: Able to state/look up THRR;Short Term: Able to use daily as guideline for intensity in rehab;Long Term: Able to use THRR to govern intensity when exercising independently       Able to check pulse independently Yes       Intervention Review the importance of being able to check  your own pulse for safety during independent exercise;Provide education and demonstration on how to check pulse in carotid and radial arteries.       Expected Outcomes Short Term: Able to explain why pulse checking is important during independent exercise;Long Term: Able to check pulse independently and accurately       Understanding of Exercise Prescription Yes       Intervention Provide education, explanation, and written materials on patient's individual exercise prescription       Expected Outcomes Short Term: Able to explain program exercise prescription;Long Term: Able to explain home exercise prescription to exercise independently              Exercise Goals Re-Evaluation :   Discharge Exercise Prescription (Final Exercise Prescription Changes):  Exercise Prescription Changes - 08/13/19 1500      Response to Exercise   Blood Pressure (Admit) 132/74    Blood Pressure (Exercise) 204/70   rck 168/80   Blood Pressure (Exit) 126/70    Heart Rate (Admit) 70 bpm    Heart Rate (Exercise) 108 bpm    Heart Rate (Exit) 78 bpm    Oxygen Saturation (Admit) 98 %    Oxygen Saturation (Exercise) 94 %    Oxygen Saturation (Exit) 94 %    Rating of  Perceived Exertion (Exercise) 13    Perceived Dyspnea (Exercise) 3    Symptoms SOB, R knee pain 5/10    Comments walk test results           Nutrition:  Target Goals: Understanding of nutrition guidelines, daily intake of sodium <152m, cholesterol <2085m calories 30% from fat and 7% or less from saturated fats, daily to have 5 or more servings of fruits and vegetables.  Education: Controlling Sodium/Reading Food Labels -Group verbal and written material supporting the discussion of sodium use in heart healthy nutrition. Review and explanation with models, verbal and written materials for utilization of the food label.   Education: General Nutrition Guidelines/Fats and Fiber: -Group instruction provided by verbal, written material, models  and posters to present the general guidelines for heart healthy nutrition. Gives an explanation and review of dietary fats and fiber.   Biometrics:  Pre Biometrics - 08/13/19 1505      Pre Biometrics   Height 5' 6.75" (1.695 m)    Weight 225 lb 12.8 oz (102.4 kg)    BMI (Calculated) 35.65    Single Leg Stand 10.94 seconds            Nutrition Therapy Plan and Nutrition Goals:   Nutrition Assessments:  Nutrition Assessments - 08/13/19 1506      MEDFICTS Scores   Pre Score 49           MEDIFICTS Score Key:          ?70 Need to make dietary changes          40-70 Heart Healthy Diet         ? 40 Therapeutic Level Cholesterol Diet  Nutrition Goals Re-Evaluation:   Nutrition Goals Discharge (Final Nutrition Goals Re-Evaluation):   Psychosocial: Target Goals: Acknowledge presence or absence of significant depression and/or stress, maximize coping skills, provide positive support system. Participant is able to verbalize types and ability to use techniques and skills needed for reducing stress and depression.   Education: Depression - Provides group verbal and written instruction on the correlation between heart/lung disease and depressed mood, treatment options, and the stigmas associated with seeking treatment.   Education: Sleep Hygiene -Provides group verbal and written instruction about how sleep can affect your health.  Define sleep hygiene, discuss sleep cycles and impact of sleep habits. Review good sleep hygiene tips.    Education: Stress and Anxiety: - Provides group verbal and written instruction about the health risks of elevated stress and causes of high stress.  Discuss the correlation between heart/lung disease and anxiety and treatment options. Review healthy ways to manage with stress and anxiety.   Initial Review & Psychosocial Screening:  Initial Psych Review & Screening - 08/07/19 1403      Initial Review   Current issues with Current  Anxiety/Panic;Current Sleep Concerns;Current Stress Concerns    Source of Stress Concerns Chronic Illness;Unable to participate in former interests or hobbies    Comments 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      Family Dynamics   Good Support System? Yes    Comments She can look to her son for support. Patient is not able to sleep well.      Barriers   Psychosocial barriers to participate in program The patient should benefit from training in stress management and relaxation.      Screening Interventions   Interventions Encouraged to exercise;To provide support and resources with identified psychosocial  needs;Provide feedback about the scores to participant    Expected Outcomes Short Term goal: Utilizing psychosocial counselor, staff and physician to assist with identification of specific Stressors or current issues interfering with healing process. Setting desired goal for each stressor or current issue identified.;Long Term Goal: Stressors or current issues are controlled or eliminated.;Short Term goal: Identification and review with participant of any Quality of Life or Depression concerns found by scoring the questionnaire.;Long Term goal: The participant improves quality of Life and PHQ9 Scores as seen by post scores and/or verbalization of changes           Quality of Life Scores:  Scores of 19 and below usually indicate a poorer quality of life in these areas.  A difference of  2-3 points is a clinically meaningful difference.  A difference of 2-3 points in the total score of the Quality of Life Index has been associated with significant improvement in overall quality of life, self-image, physical symptoms, and general health in studies assessing change in quality of life.  PHQ-9: Recent Review Flowsheet Data    Depression screen [facility] 2/9 08/13/2019 12/11/2018 12/05/2017 11/24/2016 11/24/2015   Decreased Interest 0 0 0 1 0   Down, Depressed, Hopeless 0 0  1 0 0   PHQ - 2 Score 0 0 1 1 0   Altered sleeping 1 0 2 0 -   Tired, decreased energy 2 0 1 2 -   Change in appetite 2 0 2 2 -   Feeling bad or failure about yourself  0 0 1 1 -   Trouble concentrating 0 0 0 0 -   Moving slowly or fidgety/restless 0 0 0 0 -   Suicidal thoughts 0 0 0 0 -   PHQ-9 Score 5 0 7 6 -   Difficult doing work/chores Somewhat difficult Not difficult at all Not difficult at all Not difficult at all -     Interpretation of Total Score  Total Score Depression Severity:  1-4 = Minimal depression, 5-9 = Mild depression, 10-14 = Moderate depression, 15-19 = Moderately severe depression, 20-27 = Severe depression   Psychosocial Evaluation and Intervention:  Psychosocial Evaluation - 08/07/19 1405      Psychosocial Evaluation & Interventions   Interventions Encouraged to exercise with the program and follow exercise prescription    Comments 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 She can look to her son for support. Patient is not able to sleep well.    Expected Outcomes Short: Continue to exercise regularly to support mental health and notify staff of any changes. Long: maintain mental health and well being through teaching of rehab or prescribed medications independently.    Continue Psychosocial Services  Follow up required by staff           Psychosocial Re-Evaluation:   Psychosocial Discharge (Final Psychosocial Re-Evaluation):   Education: Education Goals: Education classes will be provided on a weekly basis, covering required topics. Participant will state understanding/return demonstration of topics presented.  Learning Barriers/Preferences:  Learning Barriers/Preferences - 08/07/19 1403      Learning Barriers/Preferences   Learning Barriers None    Learning Preferences None           General Pulmonary Education Topics:  Infection Prevention: - Provides verbal and written material to individual with discussion  of infection control including proper hand washing and proper equipment cleaning during exercise session.   Pulmonary Rehab from 08/13/2019 in [facility] Cardiac  and Pulmonary Rehab  Date 08/13/19  Educator [facility]  Instruction Review Code 1- Verbalizes Understanding      Falls Prevention: - Provides verbal and written material to individual with discussion of falls prevention and safety.   Pulmonary Rehab from 08/13/2019 in [facility] Cardiac and Pulmonary Rehab  Date 08/13/19  Educator [facility]  Instruction Review Code 1- Verbalizes Understanding      Chronic Lung Diseases: - Group verbal and written instruction to review updates, respiratory medications, advancements in procedures and treatments. Discuss use of supplemental oxygen including available portable oxygen systems, continuous and intermittent flow rates, concentrators, personal use and safety guidelines. Review proper use of inhaler and spacers. Provide informative websites for self-education.    Energy Conservation: - Provide group verbal and written instruction for methods to conserve energy, plan and organize activities. Instruct on pacing techniques, use of adaptive equipment and posture/positioning to relieve shortness of breath.   Triggers and Exacerbations: - Group verbal and written instruction to review types of environmental triggers and ways to prevent exacerbations. Discuss weather changes, air quality and the benefits of nasal washing. Review warning signs and symptoms to help prevent infections. Discuss techniques for effective airway clearance, coughing, and vibrations.   AED/CPR: - Group verbal and written instruction with the use of models to demonstrate the basic use of the AED with the basic ABC's of resuscitation.   Anatomy and Physiology of the Lungs: - Group verbal and written instruction with the use of models to provide basic lung anatomy and physiology related to function, structure and complications of lung  disease.   Anatomy & Physiology of the Heart: - Group verbal and written instruction and models provide basic cardiac anatomy and physiology, with the coronary electrical and arterial systems. Review of Valvular disease and Heart Failure   Cardiac Medications: - Group verbal and written instruction to review commonly prescribed medications for heart disease. Reviews the medication, class of the drug, and side effects.   Other: -Provides group and verbal instruction on various topics (see comments)   Knowledge Questionnaire Score:  Knowledge Questionnaire Score - 08/13/19 1506      Knowledge Questionnaire Score   Pre Score 11/18 Education Focus; Pulm A&P, O2 safety            Core Components/Risk Factors/Patient Goals at Admission:  Personal Goals and Risk Factors at Admission - 08/13/19 1506      Core Components/Risk Factors/Patient Goals on Admission    Weight Management Yes;Weight Loss;Obesity    Intervention Weight Management: Develop a combined nutrition and exercise program designed to reach desired caloric intake, while maintaining appropriate intake of nutrient and fiber, sodium and fats, and appropriate energy expenditure required for the weight goal.;Weight Management: Provide education and appropriate resources to help participant work on and attain dietary goals.;Weight Management/Obesity: Establish reasonable short term and long term weight goals.;Obesity: Provide education and appropriate resources to help participant work on and attain dietary goals.    Admit Weight 225 lb 12.8 oz (102.4 kg)    Goal Weight: Short Term 220 lb (99.8 kg)    Goal Weight: Long Term 215 lb (97.5 kg)    Expected Outcomes Long Term: Adherence to nutrition and physical activity/exercise program aimed toward attainment of established weight goal;Short Term: Continue to assess and modify interventions until short term weight is achieved;Weight Loss: Understanding of general recommendations for a  balanced deficit meal plan, which promotes 1-2 lb weight loss per week and includes a negative energy balance of [phone] kcal/d;Understanding recommendations  for meals to include 15-35% energy as protein, 25-35% energy from fat, 35-60% energy from carbohydrates, less than 230m of dietary cholesterol, 20-35 gm of total fiber daily;Understanding of distribution of calorie intake throughout the day with the consumption of 4-5 meals/snacks    Improve shortness of breath with ADL's Yes    Intervention Provide education, individualized exercise plan and daily activity instruction to help decrease symptoms of SOB with activities of daily living.    Expected Outcomes Short Term: Improve cardiorespiratory fitness to achieve a reduction of symptoms when performing ADLs;Long Term: Be able to perform more ADLs without symptoms or delay the onset of symptoms    Lipids Yes    Intervention Provide education and support for participant on nutrition & aerobic/resistive exercise along with prescribed medications to achieve LDL <771m HDL >4045m   Expected Outcomes Short Term: Participant states understanding of desired cholesterol values and is compliant with medications prescribed. Participant is following exercise prescription and nutrition guidelines.;Long Term: Cholesterol controlled with medications as prescribed, with individualized exercise RX and with personalized nutrition plan. Value goals: LDL < 27m57mDL > 40 mg.           Education:Diabetes - Individual verbal and written instruction to review signs/symptoms of diabetes, desired ranges of glucose level fasting, after meals and with exercise. Acknowledge that pre and post exercise glucose checks will be done for 3 sessions at entry of program.   Education: Know Your Numbers and Risk Factors: -Group verbal and written instruction about important numbers in your health.  Discussion of what are risk factors and how they play a role in the disease process.   Review of Cholesterol, Blood Pressure, Diabetes, and BMI and the role they play in your overall health.   Core Components/Risk Factors/Patient Goals Review:    Core Components/Risk Factors/Patient Goals at Discharge (Final Review):    ITP Comments:  ITP Comments    Row Name 08/07/19 1413 08/13/19 1458 08/14/19 0653       ITP Comments Virtual Visit completed. Patient informed on EP and RD appointment and 6 Minute walk test. Patient also informed of patient health questionnaires on My Chart. Patient Verbalizes understanding. Visit diagnosis can be found in [facility]/2021. Completed 6MWT and gym orientation.  Initial ITP created and sent for review to Dr. MarkEmily Filbertdical Director. 30 Day review completed. Medical Director ITP review done, changes made as directed, and signed approval by Medical Director.            Comments: 30 Day review completed. Medical Director ITP review done, changes made as directed, and signed approval by Medical Director.

## 2019-08-15 ENCOUNTER — Telehealth: Payer: Self-pay | Admitting: Acute Care

## 2019-08-15 ENCOUNTER — Other Ambulatory Visit: Payer: Self-pay

## 2019-08-15 ENCOUNTER — Encounter: Payer: Medicare HMO | Admitting: *Deleted

## 2019-08-15 DIAGNOSIS — J449 Chronic obstructive pulmonary disease, unspecified: Secondary | ICD-10-CM | POA: Diagnosis not present

## 2019-08-15 NOTE — Progress Notes (Signed)
I called the patient to review the results of her CTA. There was no answer. I have left a HIPPA compliant message on her VM and requested she return the call for results. I have provided the office contact information.

## 2019-08-15 NOTE — Telephone Encounter (Signed)
Called and spoke with pt. Pt stated that she missed a call from Judson Roch in regards to results of her CTa and is requesting to have Sarah cal her back.  Sarah, please advise.

## 2019-08-15 NOTE — Progress Notes (Signed)
Daily Session Note  Patient Details  Name: MARLEENA SHUBERT MRN: [id number] Date of Birth: November 08, 1945 Referring Provider:     Pulmonary Rehab from 08/13/2019 in [facility] Cardiac and Pulmonary Rehab  Referring Provider [name] DO      Encounter Date: 08/15/2019  Check In:  Session Check In - 08/15/19 1116      Check-In   Supervising physician immediately available to respond to emergencies See telemetry face sheet for immediately available ER MD    Location [facility]-Cardiac & Pulmonary Rehab    Staff Present Renita Papa, RN BSN;Melissa Caiola RDN, Rowe Pavy, BA, ACSM CEP, Exercise Physiologist    Virtual Visit No    Medication changes reported     No    Fall or balance concerns reported    No    Warm-up and Cool-down Performed on first and last piece of equipment    Resistance Training Performed Yes    VAD Patient? No    PAD/SET Patient? No      Pain Assessment   Currently in Pain? No/denies              Social History   Tobacco Use  Smoking Status Former Smoker  . Packs/day: 1.50  . Years: 50.00  . Pack years: 75.00  . Types: Cigarettes  . Quit date: 04/01/2011  . Years since quitting: 8.3  Smokeless Tobacco Never Used  Tobacco Comment   has stopped but has started again in "crisis"    Goals Met:  Independence with exercise equipment Exercise tolerated well No report of cardiac concerns or symptoms Strength training completed today  Goals Unmet:  Not Applicable  Comments: First full day of exercise!  Patient was oriented to gym and equipment including functions, settings, policies, and procedures.  Patient's individual exercise prescription and treatment plan were reviewed.  All starting workloads were established based on the results of the 6 minute walk test done at initial orientation visit.  The plan for exercise progression was also introduced and progression will be customized based on patient's performance and goals.    Dr. Emily Filbert is  Medical Director for [facility] and [facility].

## 2019-08-16 ENCOUNTER — Telehealth: Payer: Self-pay | Admitting: Acute Care

## 2019-08-16 NOTE — Telephone Encounter (Signed)
Please let her know the scan showed no blood clot or any other issues. This is good news. Thanks so much.

## 2019-08-16 NOTE — Telephone Encounter (Signed)
Magdalen Spatz, NP 1 hour ago (10:38 AM)     Please let her know the scan showed no blood clot or any other issues. This is good news. Thanks so much.     Called and spoke with pt letting her know the info sated by Judson Roch and she verbalized understanding. Nothing further needed.

## 2019-08-16 NOTE — Telephone Encounter (Signed)
Attempted to call pt but unable to reach. Left message for her to return call. 

## 2019-08-20 ENCOUNTER — Other Ambulatory Visit: Payer: Self-pay

## 2019-08-21 ENCOUNTER — Ambulatory Visit (INDEPENDENT_AMBULATORY_CARE_PROVIDER_SITE_OTHER): Payer: Medicare HMO | Admitting: Acute Care

## 2019-08-21 ENCOUNTER — Encounter: Payer: Self-pay | Admitting: Acute Care

## 2019-08-21 ENCOUNTER — Telehealth: Payer: Self-pay | Admitting: Acute Care

## 2019-08-21 DIAGNOSIS — J449 Chronic obstructive pulmonary disease, unspecified: Secondary | ICD-10-CM | POA: Diagnosis not present

## 2019-08-21 DIAGNOSIS — Z87891 Personal history of nicotine dependence: Secondary | ICD-10-CM

## 2019-08-21 NOTE — Telephone Encounter (Signed)
Will call pt in the morning.

## 2019-08-21 NOTE — Telephone Encounter (Signed)
Please give this patient some samples of Trelegy 100 if we have some. If we only have samples of Trelegy 200, please give her those.tele visit with me in 2 weeks Please call her and ask her to come in to pick up the samples. She will need a follow up tele visit with me in 2 weeks. She will also need instruction on how to use the trelegy. If we do not have any samples, please call in a prescription for Trelegy 100, and call her and let her know.She will still need 2 week tele visit follow up with me. Thanks so mcuh.

## 2019-08-21 NOTE — Progress Notes (Signed)
PCCM: Thanks for seeing her [facility] 08/21/2019 5:41 PM

## 2019-08-21 NOTE — Patient Instructions (Signed)
It was good to speak with you today. I am glad you are feeling better. Your CT Angio did not show a blood clot as we discussed by phone last week.  We will do a therapeutic trial of Trlegy Take 1 puff once daily. Rinse mouth after use. Stop taking Advair while on Trelegy We will give you an Aero chamber to use with your aerosol  inhalers.  Continue  non-sedating anti-histamine like Allegra, Zyrtec, Xylol ( Take generic) once daily I will call in a refill for your Albuterol inhaler today. Use this as needed for breakthrough shortness of breath or wheezing.  We will refer you to Pulmonary rehab ( Dr. Valeta Harms) Continue to work on weight loss.  This will help with your shortness of breath.  Follow up with cardiology .Marland Kitchen  Follow up with Judson Roch NP in 2 weeks to assess for tolerance of Trelegy. Follow up with Dr. Earlie Server in 6 months per oncology, Repeat CT Chest 06/2020 Please contact office for sooner follow up if symptoms do not improve or worsen or seek emergency care .

## 2019-08-21 NOTE — Progress Notes (Signed)
Virtual Visit via Telephone Note  I connected with Hannah Green on 08/21/19 at  3:00 PM EDT by telephone and verified that I am speaking with the correct person using two identifiers.  Location: Patient: At home Provider: Working remotely from home   I discussed the limitations, risks, security and privacy concerns of performing an evaluation and management service by telephone and the availability of in person appointments. I also discussed with the patient that there may be a patient responsible charge related to this service. The patient expressed understanding and agreed to proceed.  Synopsis: Hannah Green is a 74 y.o. female former smoker with with a history of COPD and  Stage 1b non-small cell lung cancer status post right upper and middle lobectomies with lymph node dissection in February 2020 ( Dr. Roxan Hockey). Continued treatment is observation with oncology. [facility]). She is followed by Dr. Valeta Harms. Maintenance: Brextri ( Therapeutic Trial 6/7) Advair Aero Chamber Rescue : Ventolin   History of Present Illness:  Pt. Presents for follow up. She was seen 08/05/2019 for worsening shortness of breath over the last year. She had also had a 25 pound weight gain in the last year. There may be an allergy component to this worsening dyspnea. .CT done 06/2019 shows normal caliber pulmonary arteries. She does continue to have some post op pain, but states this is managable. She is anxious to get back into the gym. She has been Covid Vaccinated. [employer] 05/14/2019 and 04/21/2019).   We did a therapeutic Trial with Breztri, and we are following up to see if she has had any improvement of her dyspnea  on this medication. We also prescribed an aero chamber to see if this has improved her use of her inhalers.   Pt.  states she completed her prednisone taper, and  has had great improvement on the [place]. Less Dyspnea. She would like a prescription sent in. Upon placing the order, her  insurance does not cover this medication. They do however cover Trelegy.We will therefore do a therapeutic Trial on Trelegy to see if she has the same results of less Dyspnea, as this will be more affordable.  She denies any chest pain, hemoptysis or orthopnea. No fever or discolored secretions..     Observations/Objective:  No dyspnea noted per phone. Speaking in full sentences.  CTA 08/13/2019 No definite evidence of pulmonary embolus. Status post right upper and middle lobectomy. Stable small bilateral pulmonary nodules are noted compared to prior exam. Large sliding-type hiatal hernia.  07/09/2019:CT Chest with Contrast Lungs/Pleura: No pneumothorax. No pleural effusion. Status post right middle and right upper lobectomies. Moderate centrilobular and paraseptal emphysema. Stable calcified subcentimeter medial basilar right lower lobe granuloma. Tiny 2 mm peripheral upper right lower lobe solid pulmonary nodule (series 7/image 45) and clustered 4 mm (series 7/image 52) and 3 mm (series 7/image 46) medial left upper lobe solid pulmonary nodules, all unchanged. No new significant pulmonary nodules. No acute consolidative airspace disease or lung masses. Mild subpleural reticulation in peripheral basilar right lower lobe is unchanged, favor post treatment change.  No findings suspicious for metastatic disease in the chest. Tiny pulmonary nodules are all stable. Stable small focus of soft tissue density anterior to the left brachiocephalic vein, potentially related to the right sternoclavicular joint capsule. 2. No evidence of local tumor recurrence in the right lung status post right middle and right upper lobectomy.  PFT's  Severe obstructive defect, significant BD response  Assessment and Plan: COPD Stage 3 Severe  Currently on Advair as maintenance Weight gain of 25 pounds over the last year. Plan Your CT Angio did not show a blood clot as we discussed by phone last week.   We will do a therapeutic trial of Trlegy Take 1 puff once daily. Rinse mouth after use. Stop taking Advair while on Trelegy We will give you an Aero chamber to use with your aerosol  inhalers.  Continue  non-sedating anti-histamine like Allegra, Zyrtec, Xylol ( Take generic) once daily I will call in a refill for your Albuterol inhaler today. Use this as needed for breakthrough shortness of breath or wheezing.  We will refer you to Pulmonary rehab ( Dr. Valeta Harms) Continue to work on weight loss.  This will help with your shortness of breath.  Follow up with cardiology .Marland Kitchen  Follow up with Judson Roch NP in 2 weeks to assess for tolerance of Trelegy. Please contact office for sooner follow up if symptoms do not improve or worsen or seek emergency care .   Stage 1b non-small cell lung cancer status post right upper and middle lobectomies with lymph node dissection in February 2020 ( Dr. Roxan Hockey). Continued treatment is observation with oncology. [facility]). Plan Follow up with Mohammed in 6 months Repeat CT 06/2020    Follow Up Instructions: Follow up with Judson Roch NP in 2 weeks to assess for tolerance of Trelegy.   I discussed the assessment and treatment plan with the patient. The patient was provided an opportunity to ask questions and all were answered. The patient agreed with the plan and demonstrated an understanding of the instructions.   The patient was advised to call back or seek an in-person evaluation if the symptoms worsen or if the condition fails to improve as anticipated.  I provided 32  minutes of non-face-to-face time during this encounter.   Magdalen Spatz, NP 08/21/2019

## 2019-08-22 ENCOUNTER — Telehealth: Payer: Self-pay | Admitting: Acute Care

## 2019-08-22 ENCOUNTER — Encounter: Payer: Medicare HMO | Admitting: *Deleted

## 2019-08-22 ENCOUNTER — Other Ambulatory Visit: Payer: Self-pay

## 2019-08-22 DIAGNOSIS — J449 Chronic obstructive pulmonary disease, unspecified: Secondary | ICD-10-CM | POA: Diagnosis not present

## 2019-08-22 NOTE — Telephone Encounter (Signed)
ATC pt, no answer. Left message for pt to call back.  

## 2019-08-22 NOTE — Progress Notes (Signed)
Daily Session Note  Patient Details  Name: Hannah Green MRN: [id number] Date of Birth: [birth year] Referring Provider:     Pulmonary Rehab from 08/13/2019 in [facility] Cardiac and Pulmonary Rehab  Referring Provider [name] DO      Encounter Date: 08/22/2019  Check In:  Session Check In - 08/22/19 1132      Check-In   Supervising physician immediately available to respond to emergencies See telemetry face sheet for immediately available ER MD    Location [facility]-Cardiac & Pulmonary Rehab    Staff Present Renita Papa, RN BSN;Joseph [address] [place], [place], [place], CCRP, CCET    Virtual Visit No    Medication changes reported     No    Fall or balance concerns reported    No    Warm-up and Cool-down Performed on first and last piece of equipment    Resistance Training Performed Yes    VAD Patient? No    PAD/SET Patient? No      Pain Assessment   Currently in Pain? No/denies              Social History   Tobacco Use  Smoking Status Former Smoker  . Packs/day: 1.50  . Years: 50.00  . Pack years: 75.00  . Types: Cigarettes  . Quit date: 04/01/2011  . Years since quitting: 8.3  Smokeless Tobacco Never Used  Tobacco Comment   has stopped but has started again in "crisis"    Goals Met:  Independence with exercise equipment Exercise tolerated well No report of cardiac concerns or symptoms Strength training completed today  Goals Unmet:  Not Applicable  Comments: Pt able to follow exercise prescription today without complaint.  Will continue to monitor for progression.    Dr. Emily Filbert is Medical Director for [facility] and [facility].

## 2019-08-22 NOTE — Telephone Encounter (Signed)
Called patient and left message to call us back.

## 2019-08-23 ENCOUNTER — Telehealth: Payer: Self-pay | Admitting: Acute Care

## 2019-08-23 MED ORDER — TRELEGY ELLIPTA 100-62.5-25 MCG/INH IN AEPB: 1.0000 | INHALATION_SPRAY | Freq: Every day | RESPIRATORY_TRACT | 0 refills | Status: DC

## 2019-08-23 NOTE — Telephone Encounter (Signed)
Called and spoke with pt letting her know that I was putting samples of trelegy up front for her to come by and pick up. Pt verbalized understanding. Also scheduled pt a follow up  televisit with SG in 2 weeks. Nothing further needed.

## 2019-08-23 NOTE — Telephone Encounter (Signed)
I had a tele visit with her Wednesday. We reviewed the results then. She should not need any further review of the CTA, as it was negative for PE. Thanks Ria Comment

## 2019-08-23 NOTE — Telephone Encounter (Signed)
Pt is returning call in regards to her most recent CTa results. There is documentation on the result note from Judson Roch stating that she tried to call the pt in regards to results.  Judson Roch - please advise. Thanks!

## 2019-08-26 DIAGNOSIS — Z961 Presence of intraocular lens: Secondary | ICD-10-CM | POA: Diagnosis not present

## 2019-08-26 DIAGNOSIS — H26491 Other secondary cataract, right eye: Secondary | ICD-10-CM | POA: Diagnosis not present

## 2019-08-27 ENCOUNTER — Encounter: Payer: Medicare HMO | Admitting: *Deleted

## 2019-08-27 ENCOUNTER — Other Ambulatory Visit: Payer: Self-pay

## 2019-08-27 DIAGNOSIS — J449 Chronic obstructive pulmonary disease, unspecified: Secondary | ICD-10-CM

## 2019-08-27 NOTE — Progress Notes (Signed)
Daily Session Note  Patient Details  Name: Hannah Green MRN: [id number] Date of Birth: 16-Oct-1945 Referring Provider:     Pulmonary Rehab from 08/13/2019 in [facility] Cardiac and Pulmonary Rehab  Referring Provider [name] DO      Encounter Date: 08/27/2019  Check In:  Session Check In - 08/27/19 1118      Check-In   Supervising physician immediately available to respond to emergencies See telemetry face sheet for immediately available ER MD    Location [facility]-Cardiac & Pulmonary Rehab    Staff Present Renita Papa, RN BSN;Joseph Foy [place], [place], ACSM CEP, Exercise Physiologist;Kara Eliezer Bottom, MS Exercise Physiologist;Klani Caridi Luan [place], MA, RCEP, CCRP, CCET    Virtual Visit No    Medication changes reported     No    Fall or balance concerns reported    No    Warm-up and Cool-down Performed on first and last piece of equipment    Resistance Training Performed Yes    VAD Patient? No    PAD/SET Patient? No      Pain Assessment   Currently in Pain? No/denies              Social History   Tobacco Use  Smoking Status Former Smoker  . Packs/day: 1.50  . Years: 50.00  . Pack years: 75.00  . Types: Cigarettes  . Quit date: 04/01/2011  . Years since quitting: 8.4  Smokeless Tobacco Never Used  Tobacco Comment   has stopped but has started again in "crisis"    Goals Met:  Proper associated with RPD/PD & O2 Sat Independence with exercise equipment Using PLB without cueing & demonstrates good technique Exercise tolerated well No report of cardiac concerns or symptoms Strength training completed today  Goals Unmet:  Not Applicable  Comments: Pt able to follow exercise prescription today without complaint.  Will continue to monitor for progression.    Dr. Emily Filbert is Medical Director for [facility] and [facility].

## 2019-08-29 ENCOUNTER — Encounter: Payer: Medicare HMO | Attending: Pulmonary Disease | Admitting: *Deleted

## 2019-08-29 ENCOUNTER — Other Ambulatory Visit: Payer: Self-pay

## 2019-08-29 ENCOUNTER — Encounter: Payer: Self-pay | Admitting: Cardiology

## 2019-08-29 ENCOUNTER — Ambulatory Visit: Payer: Medicare HMO | Admitting: Cardiology

## 2019-08-29 VITALS — BP 130/70 | HR 71 | Temp 97.8°F | Ht 66.5 in | Wt 226.0 lb

## 2019-08-29 DIAGNOSIS — J449 Chronic obstructive pulmonary disease, unspecified: Secondary | ICD-10-CM | POA: Diagnosis not present

## 2019-08-29 DIAGNOSIS — M79661 Pain in right lower leg: Secondary | ICD-10-CM | POA: Diagnosis not present

## 2019-08-29 DIAGNOSIS — I5189 Other ill-defined heart diseases: Secondary | ICD-10-CM

## 2019-08-29 DIAGNOSIS — R0789 Other chest pain: Secondary | ICD-10-CM | POA: Insufficient documentation

## 2019-08-29 DIAGNOSIS — M7989 Other specified soft tissue disorders: Secondary | ICD-10-CM

## 2019-08-29 DIAGNOSIS — R0609 Other forms of dyspnea: Secondary | ICD-10-CM

## 2019-08-29 DIAGNOSIS — R0600 Dyspnea, unspecified: Secondary | ICD-10-CM

## 2019-08-29 NOTE — Progress Notes (Signed)
Primary Care Provider: Jinny Sanders, MD Cardiologist: Glenetta Hew, MD Electrophysiologist: None  Clinic Note: Chief Complaint  Patient presents with  . Chest Pain  . Leg Swelling  . Palpitations    HPI:    Hannah Green is a 74 y.o. female with a PMH Severe COPD (by GOLD) & STage 1A SCCa of RUL (s/p R Sided VATS bilobectomy - complicated by post-OP Afib)) with normal Echo & R/LHC below who presents today for ~6 month f/u with c/o CHEST PAIN, SOB/DOE & R>>L LE swelling.  Hannah Green was last seen on 01/14/2019 --> doing fairly well from a cardiac standpoint.  Noticing more pronounced dyspnea since her surgery and not feeling well since.  Had not yet been on to get into activity because of COVID-19 restrictions of gym access.  No chest pain or pressure.  Just DOE.  Occasional palpitations but stable.  Recent Hospitalizations: None  Reviewed  CV studies:    The following studies were reviewed today: (if available, images/films reviewed: From Epic Chart or Care Everywhere) . None:   Interval History:   Hannah Green is here today for routine follow-up with a few complaints.  She has been having right leg greater than left swelling-last week she was not even able to put her shoe on.  Currently looks little better than it has.  Definitely the right is worse than the left and she has not had any injuries. ->  No real associated PND or orthopnea.  For the past couple months she has been noticing off and on episodes of fast heart rates that can last anywhere from 30 seconds to 10 had 15 minutes.  She describes it as feeling her heart pounding rapidly, but not necessarily irregular.  She does feel short of breath with these episodes, but has not really felt lightheaded or dizzy.  She has had diffuse episodes of chest discomfort on the left midsternal wall as well as underneath the breast.  Sometimes he also has on the right side.  Worse with deep inspiration and with  certain movements.  Not necessarily worse with exertion.  At baseline she is short of breath with exertion, but this is stable not actually worse or better than it has been before.  The chest discomfort she feels is not with exertion and not associated with dyspnea.  Palpitations are not necessarily exertional and not associated with dyspnea.  CV Review of Symptoms (Summary) Cardiovascular ROS: positive for - chest pain, dyspnea on exertion, edema and rapid heart rate negative for - orthopnea, paroxysmal nocturnal dyspnea, shortness of breath or Syncope/near syncope, TIA/amaurosis fugax, claudication  The patient does not have symptoms concerning for COVID-19 infection (fever, chills, cough, or new shortness of breath).  The patient is practicing social distancing & Masking.    REVIEWED OF SYSTEMS   Review of Systems  Constitutional: Positive for malaise/fatigue. Negative for weight loss.  HENT: Negative for congestion and nosebleeds.   Respiratory: Positive for cough and shortness of breath. Negative for sputum production and wheezing.   Cardiovascular: Positive for leg swelling (R>L).  Gastrointestinal: Negative for blood in stool, constipation and melena.  Genitourinary: Negative for frequency and hematuria.  Musculoskeletal: Positive for joint pain (with leg swelling). Negative for falls.  Neurological: Negative for focal weakness and headaches.  Psychiatric/Behavioral: Negative.    I have reviewed and (if needed) personally updated the patient's problem list, medications, allergies, past medical and surgical history, social and family history.   PAST  MEDICAL HISTORY   Past Medical History:  Diagnosis Date  . Breast cancer ([place]) 1999  . Cataract 2017   resolved with surgery  . COPD (chronic obstructive pulmonary disease) ([place])   . Coronary artery disease   . GERD (gastroesophageal reflux disease)   . Hyperlipidemia   . Postoperative atrial fibrillation ([place]) 03/2018  .  Rheumatoid arthritis(714.0)   . Squamous cell carcinoma of lung, stage I, right ([place]) 2020   Status post right upper and middle lobectomy    PAST SURGICAL HISTORY   Past Surgical History:  Procedure Laterality Date  . BREAST LUMPECTOMY  1999   left, needed radiation  . CATARACT EXTRACTION W/ INTRAOCULAR LENS  IMPLANT, BILATERAL Bilateral 05/19/15, 06/02/15  . CHEST TUBE INSERTION Right 04/23/2018   Procedure: Right Lateral Chest Tube Insertion;  Surgeon: Melrose Nakayama, MD;  Location: [facility];  Service: Thoracic;  Laterality: Right;  . CHOLECYSTECTOMY    . LOBECTOMY Right 04/23/2018   Procedure: RIGHT UPPER LOBECTOMY;  Surgeon: Melrose Nakayama, MD;  Location: [facility];  Service: Thoracic;  Laterality: Right;  . NM MYOVIEW LTD  01/2016   LOW RISK. Hypertensive response to exercise followed by significant drop (stage I pressure 211/70 drop to 118/75. Stage II). Apical artifact but otherwise no ischemia or infarction. EF 64%.  Marland Kitchen NODE DISSECTION Right 04/23/2018   Procedure: MEDIASTINAL NODE DISSECTION;  Surgeon: Melrose Nakayama, MD;  Location: [facility];  Service: Thoracic;  Laterality: Right;  . RIGHT/LEFT HEART CATH AND CORONARY ANGIOGRAPHY N/A 04/10/2018   Procedure: RIGHT/LEFT HEART CATH AND CORONARY ANGIOGRAPHY;  Surgeon: Leonie Man, MD;  Location: [facility];; angiographically minimal coronary artery disease.  Normal LVEDP.  Basely normal RHC pressures with no suggestion of pulmonary hypertension.  Normal CO-CI by Fick (5.9-2.8)  . TONSILLECTOMY    . TRANSTHORACIC ECHOCARDIOGRAM  11/'17; 2/'20   a) Nov 2017: Mild LVH. EF 60-65%. GR 2-DD; b) Feb 2020: Normal LV size and function.  EF 60 to 65%.  No R WMA.  GR 1 DD.  Normal RV size and function.  Normal pressures.  No valvular disease.  Marland Kitchen VIDEO ASSISTED THORACOSCOPY (VATS)/WEDGE RESECTION Right 04/23/2018   Procedure: VIDEO ASSISTED THORACOSCOPY (VATS)/WEDGE RESECTION OF RIGHT UPPER LUNG;  Surgeon: Melrose Nakayama,  MD;  Location[facility];  Service: Thoracic;  Laterality: Right;    MEDICATIONS/ALLERGIES   Current Meds  Medication Sig  . albuterol (VENTOLIN HFA) 108 (90 Base) MCG/ACT inhaler TAKE 2 PUFFS BY MOUTH EVERY 6 HOURS AS NEEDED FOR WHEEZE OR SHORTNESS OF BREATH  . aspirin EC 81 MG tablet Take 1 tablet (81 mg total) by mouth daily.  Marland Kitchen atorvastatin (LIPITOR) 10 MG tablet Take 1 tablet (10 mg total) by mouth daily.  . Budeson-Glycopyrrol-Formoterol (BREZTRI AEROSPHERE) 160-9-4.8 MCG/ACT AERO Inhale 2 puffs into the lungs in the morning and at bedtime.  . Budeson-Glycopyrrol-Formoterol (BREZTRI AEROSPHERE) 160-9-4.8 MCG/ACT AERO Inhale 2 puffs into the lungs 2 (two) times daily.  . cholecalciferol (VITAMIN D3) 25 MCG (1000 UT) tablet Take 1,000 Units by mouth daily.  . Fluticasone-Umeclidin-Vilant (TRELEGY ELLIPTA) 100-62.5-25 MCG/INH AEPB Inhale 1 puff into the lungs daily.  . predniSONE (DELTASONE) 10 MG tablet Take 3 tabs for 2 days, 2 tabs for 2 days, then 1 tab for 2 days, then stop.    Allergies  Allergen Reactions  . Adhesive [Tape] Rash    Paper tape causes rash  . Neosporin [Neomycin-Bacitracin Zn-Polymyx] Itching  . Penicillins Rash  Did it involve swelling of the face/tongue/throat, SOB, or low BP? No Did it involve sudden or severe rash/hives, skin peeling, or any reaction on the inside of your mouth or nose? Yes Did you need to seek medical attention at a hospital or doctor's office? Yes When did it last happen? From childhood If all above answers are "NO", may proceed with cephalosporin use.     SOCIAL HISTORY/FAMILY HISTORY   Reviewed in Epic:  Pertinent findings: No new changes  OBJCTIVE -PE, EKG, labs   Wt Readings from Last 3 Encounters:  08/29/19 226 lb (102.5 kg)  08/13/19 225 lb 12.8 oz (102.4 kg)  08/05/19 225 lb 12.8 oz (102.4 kg)    Physical Exam: BP 130/70   Pulse 71   Temp 97.8 F (36.6 C)   Ht 5' 6.5" (1.689 m)   Wt 226 lb (102.5 kg)   LMP  (LMP  Unknown)   SpO2 92%   BMI 35.93 kg/m  Physical Exam Constitutional:      General: She is not in acute distress.    Appearance: Normal appearance. She is obese. She is not toxic-appearing.  HENT:     Head: Normocephalic and atraumatic.  Neck:     Vascular: No carotid bruit, hepatojugular reflux or JVD.  Cardiovascular:     Rate and Rhythm: Normal rate and regular rhythm.  No extrasystoles are present.    Chest Wall: PMI is not displaced (Difficult to assess due to body habitus.).     Pulses: Normal pulses.     Heart sounds: Normal heart sounds. No murmur heard.  No friction rub. No S4 sounds.   Pulmonary:     Effort: Pulmonary effort is normal. No respiratory distress.     Breath sounds: No wheezing or rales.     Comments: Diminished right-sided breath sounds, but clear Chest:     Chest wall: Tenderness (Point tenderness along the second and third costochondral margin as well as the floating rib 7 and 8 costal margins.  This does reproduce her pain.) present.  Musculoskeletal:        General: Swelling present.     Cervical back: Normal range of motion and neck supple.     Right lower leg: Edema (2+) present.     Left lower leg: Edema (Trace- 1+) present.  Neurological:     General: No focal deficit present.     Mental Status: She is alert and oriented to person, place, and time.  Psychiatric:        Mood and Affect: Mood normal.        Behavior: Behavior normal.        Thought Content: Thought content normal.        Judgment: Judgment normal.      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  No swelling to suggest any CHF findings, however now she is having edema and exertional dyspnea.  Plan: We check 2D echo. Would likely benefit from being on a diuretic.  Not currently on any antihypertensives, could also consider afterload reduction with ACE inhibitor or ARB.      DOE (dyspnea on exertion) - Primary (Chronic)    Seems like her dyspnea has gotten worse and she is now having swelling as well as palpitations.  Plan: Recheck 2D echo, but otherwise would suspect baseline pulmonary etiology.      Relevant Orders   ECHOCARDIOGRAM COMPLETE   Left-sided chest wall pain    Symptoms sound very musculoskeletal in nature.  Reproducible on exam.  Has had a pretty negative ischemic evaluation.  I do not think this is very likely to be related to CAD.  Recommend treatment with analgesics such as NSAIDs or Tylenol.      Relevant Orders   ECHOCARDIOGRAM COMPLETE   Pain and swelling of right lower leg    Unilateral swelling is somewhat concerning although it could be that just one side is more swollen than the other.  Will check lower extremity venous Dopplers to evaluate for DVT, however it does sound like she may have had a Baker's cyst since she was feeling swelling behind the back of the knee.  We will also check 2D echo to exclude CHF.      Relevant Orders   VAS [place] LOWER EXTREMITY VENOUS (DVT)       COVID-19 Education: The signs and symptoms of COVID-19 were discussed with the patient and how to seek care for testing (follow up with PCP or arrange E-visit).   The importance of social distancing and COVID-19 vaccination was discussed today.  I spent a total of 76minutes with the patient. >  50% of the time was  spent in direct patient consultation.  Additional time spent with chart review  / charting (studies, outside notes, etc): 6 Total Time: 32 min   Current medicines are reviewed at length with the patient today.  (+/- concerns) none  Notice: This dictation was prepared with Dragon dictation along with smaller phrase technology. Any transcriptional errors that result from this process are unintentional and may not be corrected upon review.  Patient Instructions / Medication Changes & Studies & Tests Ordered   Patient Instructions  Medication Instructions:  NO CHANGES  *If you need a refill on your cardiac medications before your next appointment, please call your pharmacy*   Lab Work: NOT NEEDED If you have labs (blood work) drawn today and your tests are completely normal, you will receive your results only by: Marland Kitchen MyChart Message (if you have MyChart) OR . A paper copy in the mail If you have any lab test that is abnormal or we need to change your treatment, we will call you to review the results.   Testing/Procedures: WILL BE SCHEDULE AT  [place] Your physician has requested that you have an echocardiogram. Echocardiography is a painless test that uses sound waves to create images of your heart. It provides your doctor with information about the size and shape of your heart and how well your heart's chambers and valves are working. This procedure takes approximately one hour. There are no restrictions for this procedure.   AND  WILL BE SCHEDULE AT [place] [address] Your  physician has requested that you have a lower extremity venous duplex FOR DVT. This test is an ultrasound of the veins in the legs. It looks at venous blood flow that carries blood from the heart to the legs. Allow one hour for a Lower Venous exam.  There are no restrictions or special instructions.     Follow-Up: At [facility], you and your health needs are our priority.   As part of our continuing mission to provide you with exceptional heart care, we have created designated Provider Care Teams.  These Care Teams include your primary Cardiologist (physician) and Advanced Practice Providers (APPs -  Physician Assistants and Nurse Practitioners) who all work together to provide you with the care you need, when you need it.    Your next appointment:   2 month(s)  The format for your next appointment:   In Person  Provider:   Glenetta Hew, MD   Other Instructions     Studies Ordered:   Orders Placed This Encounter  Procedures  . ECHOCARDIOGRAM COMPLETE  . VAS [place] LOWER EXTREMITY VENOUS (DVT)     Glenetta Hew, M.D., M.S. Interventional Cardiologist   Pager # [phone] Phone # [phone] [address] [place], [place] [address]   Thank you for choosing Heartcare at [facility]!!

## 2019-08-29 NOTE — Patient Instructions (Addendum)
Medication Instructions:  NO CHANGES  *If you need a refill on your cardiac medications before your next appointment, please call your pharmacy*   Lab Work: NOT NEEDED If you have labs (blood work) drawn today and your tests are completely normal, you will receive your results only by: Marland Kitchen MyChart Message (if you have MyChart) OR . A paper copy in the mail If you have any lab test that is abnormal or we need to change your treatment, we will call you to review the results.   Testing/Procedures: WILL BE SCHEDULE AT  [place] Your physician has requested that you have an echocardiogram. Echocardiography is a painless test that uses sound waves to create images of your heart. It provides your doctor with information about the size and shape of your heart and how well your heart's chambers and valves are working. This procedure takes approximately one hour. There are no restrictions for this procedure.   AND  WILL BE SCHEDULE AT [place] Your physician has requested that you have a lower extremity venous duplex FOR DVT. This test is an ultrasound of the veins in the legs. It looks at venous blood flow that carries blood from the heart to the legs. Allow one hour for a Lower Venous exam.  There are no restrictions or special instructions.     Follow-Up: At [facility], you and your health needs are our priority.  As part of our continuing mission to provide you with exceptional heart care, we have created designated Provider Care Teams.  These Care Teams include your primary Cardiologist (physician) and Advanced Practice Providers (APPs -  Physician Assistants and Nurse Practitioners) who all work together to provide you with the care you need, when you need it.    Your next appointment:   2 month(s)  The format for your next appointment:   In Person  Provider:   Glenetta Hew, MD   Other Instructions

## 2019-08-29 NOTE — Progress Notes (Signed)
Daily Session Note  Patient Details  Name: ZAURIA DOMBEK MRN: [id number] Date of Birth: [birth year] Referring Provider:     Pulmonary Rehab from 08/13/2019 in [facility] Card[facility] Pul[facility] Re[facility]  Referring Provider [name] DO      Encounter Date: 08/29/2019  Check In:  Session Check In - 08/29/19 1508      Check-In   Supervising physician immediately available to respond to emergencies See telemetry face sheet for immediately available ER MD    Location [facility]-Cardiac & Pulmonary Rehab    Staff Present Renita Papa, RN BSN;Joseph [address] [place], [place], [place], CCRP, CCET    Virtual Visit No    Medication changes reported     No    Fall or balance concerns reported    No    Warm-up and Cool-down Performed on first and last piece of equipment    Resistance Training Performed Yes    VAD Patient? No    PAD/SET Patient? No      Pain Assessment   Currently in Pain? No/denies              Social History   Tobacco Use  Smoking Status Former Smoker  . Packs/day: 1.50  . Years: 50.00  . Pack years: 75.00  . Types: Cigarettes  . Quit date: 04/01/2011  . Years since quitting: 8.4  Smokeless Tobacco Never Used  Tobacco Comment   has stopped but has started again in "crisis"    Goals Met:  Independence with exercise equipment Exercise tolerated well No report of cardiac concerns or symptoms Strength training completed today  Goals Unmet:  Not Applicable  Comments: Pt able to follow exercise prescription today without complaint.  Will continue to monitor for progression.    Dr. Emily Filbert is Medical Director for [facility] and [facility].

## 2019-08-30 ENCOUNTER — Telehealth: Payer: Self-pay | Admitting: Cardiology

## 2019-08-30 ENCOUNTER — Ambulatory Visit: Payer: Medicare HMO | Admitting: Acute Care

## 2019-08-30 NOTE — Telephone Encounter (Signed)
error 

## 2019-09-01 ENCOUNTER — Encounter: Payer: Self-pay | Admitting: Cardiology

## 2019-09-01 NOTE — Assessment & Plan Note (Signed)
Unilateral swelling is somewhat concerning although it could be that just one side is more swollen than the other.  Will check lower extremity venous Dopplers to evaluate for DVT, however it does sound like she may have had a Baker's cyst since she was feeling swelling behind the back of the knee.  We will also check 2D echo to exclude CHF.

## 2019-09-01 NOTE — Assessment & Plan Note (Signed)
Symptoms sound very musculoskeletal in nature.  Reproducible on exam.  Has had a pretty negative ischemic evaluation.  I do not think this is very likely to be related to CAD.  Recommend treatment with analgesics such as NSAIDs or Tylenol.

## 2019-09-01 NOTE — Assessment & Plan Note (Signed)
In the past, her echo was relatively normal in the right and left heart cath look normal.  No swelling to suggest any CHF findings, however now she is having edema and exertional dyspnea.  Plan: We check 2D echo. Would likely benefit from being on a diuretic.  Not currently on any antihypertensives, could also consider afterload reduction with ACE inhibitor or ARB.

## 2019-09-01 NOTE — Assessment & Plan Note (Signed)
Seems like her dyspnea has gotten worse and she is now having swelling as well as palpitations.  Plan: Recheck 2D echo, but otherwise would suspect baseline pulmonary etiology.

## 2019-09-03 ENCOUNTER — Other Ambulatory Visit: Payer: Self-pay

## 2019-09-03 ENCOUNTER — Encounter: Payer: Medicare HMO | Admitting: *Deleted

## 2019-09-03 DIAGNOSIS — J449 Chronic obstructive pulmonary disease, unspecified: Secondary | ICD-10-CM

## 2019-09-03 NOTE — Progress Notes (Signed)
Daily Session Note  Patient Details  Name: Hannah Green MRN: [id number] Date of Birth: 1945-09-01 Referring Provider:     Pulmonary Rehab from 08/13/2019 in [facility] Cardiac and Pulmonary Rehab  Referring Provider [name] DO      Encounter Date: 09/03/2019  Check In:  Session Check In - 09/03/19 1159      Check-In   Staff Present Heath Lark, RN, BSN, Jacklynn Bue, MS Exercise Physiologist;Amanda Oletta Darter, BA, ACSM CEP, Exercise Physiologist;Jessica [place], MA, RCEP, CCRP, CCET    Virtual Visit No    Medication changes reported     No    Fall or balance concerns reported    No    Warm-up and Cool-down Performed on first and last piece of equipment    Resistance Training Performed Yes    VAD Patient? No    PAD/SET Patient? No      Pain Assessment   Currently in Pain? No/denies              Social History   Tobacco Use  Smoking Status Former Smoker  . Packs/day: 1.50  . Years: 50.00  . Pack years: 75.00  . Types: Cigarettes  . Quit date: 04/01/2011  . Years since quitting: 8.4  Smokeless Tobacco Never Used  Tobacco Comment   has stopped but has started again in "crisis"    Goals Met:  Proper associated with RPD/PD & O2 Sat Independence with exercise equipment Exercise tolerated well No report of cardiac concerns or symptoms  Goals Unmet:  Not Applicable  Comments: Pt able to follow exercise prescription today without complaint.  Will continue to monitor for progression.    Dr. Emily Filbert is Medical Director for [place] and [facility].

## 2019-09-05 ENCOUNTER — Encounter: Payer: Medicare HMO | Admitting: *Deleted

## 2019-09-05 ENCOUNTER — Other Ambulatory Visit: Payer: Self-pay

## 2019-09-05 DIAGNOSIS — J449 Chronic obstructive pulmonary disease, unspecified: Secondary | ICD-10-CM | POA: Diagnosis not present

## 2019-09-05 NOTE — Progress Notes (Signed)
Daily Session Note  Patient Details  Name: Hannah Green MRN: [id number] Date of Birth: May 19, 1945 Referring Provider:     Pulmonary Rehab from 08/13/2019 in [facility] Cardiac and Pulmonary Rehab  Referring Provider [name] DO      Encounter Date: 09/05/2019  Check In:  Session Check In - 09/05/19 1112      Check-In   Supervising physician immediately available to respond to emergencies See telemetry face sheet for immediately available ER MD    Location [facility]-Cardiac & Pulmonary Rehab    Staff Present Renita Papa, RN BSN;Joseph [address] [place], [place], [place], CCRP, [place], [place], ACSM CEP, Exercise Physiologist    Virtual Visit No    Medication changes reported     No    Fall or balance concerns reported    No    Resistance Training Performed Yes    VAD Patient? No    PAD/SET Patient? No      Pain Assessment   Currently in Pain? No/denies              Social History   Tobacco Use  Smoking Status Former Smoker  . Packs/day: 1.50  . Years: 50.00  . Pack years: 75.00  . Types: Cigarettes  . Quit date: 04/01/2011  . Years since quitting: 8.4  Smokeless Tobacco Never Used  Tobacco Comment   has stopped but has started again in "crisis"    Goals Met:  Independence with exercise equipment Exercise tolerated well No report of cardiac concerns or symptoms Strength training completed today  Goals Unmet:  Not Applicable  Comments: Pt able to follow exercise prescription today without complaint.  Will continue to monitor for progression.    Dr. Emily Filbert is Medical Director for [place] and [facility].

## 2019-09-11 ENCOUNTER — Encounter: Payer: Self-pay | Admitting: *Deleted

## 2019-09-11 DIAGNOSIS — J449 Chronic obstructive pulmonary disease, unspecified: Secondary | ICD-10-CM

## 2019-09-11 NOTE — Progress Notes (Signed)
Pulmonary Individual Treatment Plan  Patient Details  Name: Hannah Green MRN: [id number] Date of Birth: [birth year] Referring Provider:     Pulmonary Rehab from 08/13/2019 in [facility] Cardiac and Pulmonary Rehab  Referring Provider [name] DO      Initial Encounter Date:    Pulmonary Rehab from 08/13/2019 in [facility] Cardiac and Pulmonary Rehab  Date 08/13/19      Visit Diagnosis: Stage 3 severe COPD by GOLD classification ([place])  Patient's Home Medications on Admission:  Current Outpatient Medications:    albuterol (VENTOLIN HFA) 108 (90 Base) MCG/ACT inhaler, TAKE 2 PUFFS BY MOUTH EVERY 6 HOURS AS NEEDED FOR WHEEZE OR SHORTNESS OF BREATH, Disp: 8.5 g, Rfl: 3   aspirin EC 81 MG tablet, Take 1 tablet (81 mg total) by mouth daily., Disp: 90 tablet, Rfl: 3   atorvastatin (LIPITOR) 10 MG tablet, Take 1 tablet (10 mg total) by mouth daily., Disp: 90 tablet, Rfl: 3   Budeson-Glycopyrrol-Formoterol (BREZTRI AEROSPHERE) 160-9-4.8 MCG/ACT AERO, Inhale 2 puffs into the lungs in the morning and at bedtime., Disp: 5.9 g, Rfl: 0   Budeson-Glycopyrrol-Formoterol (BREZTRI AEROSPHERE) 160-9-4.8 MCG/ACT AERO, Inhale 2 puffs into the lungs 2 (two) times daily., Disp: 10.7 g, Rfl: 2   cholecalciferol (VITAMIN D3) 25 MCG (1000 UT) tablet, Take 1,000 Units by mouth daily., Disp: , Rfl:    Fluticasone-Umeclidin-Vilant (TRELEGY ELLIPTA) 100-62.5-25 MCG/INH AEPB, Inhale 1 puff into the lungs daily., Disp: 14 each, Rfl: 0   predniSONE (DELTASONE) 10 MG tablet, Take 3 tabs for 2 days, 2 tabs for 2 days, then 1 tab for 2 days, then stop., Disp: 12 tablet, Rfl: 0  Past Medical History: Past Medical History:  Diagnosis Date   Breast cancer ([place]) 1999   Cataract 2017   resolved with surgery   COPD (chronic obstructive pulmonary disease) ([place])    Coronary artery disease    GERD (gastroesophageal reflux disease)    Hyperlipidemia    Postoperative atrial fibrillation ([place]) 03/2018   Rheumatoid  arthritis(714.0)    Squamous cell carcinoma of lung, stage I, right ([place]) 2020   Status post right upper and middle lobectomy    Tobacco Use: Social History   Tobacco Use  Smoking Status Former Smoker   Packs/day: 1.50   Years: 50.00   Pack years: 75.00   Types: Cigarettes   Quit date: 04/01/2011   Years since quitting: 8.4  Smokeless Tobacco Never Used  Tobacco Comment   has stopped but has started again in "crisis"    Labs: Recent Review Flowsheet Data    Labs for ITP Cardiac and Pulmonary Rehab Latest Ref Rng & Units 04/10/2018 04/19/2018 04/23/2018 04/24/2018 12/11/2018   Cholestrol 0 - 200 mg/dL - - - - 218(H)   LDLCALC 0 - 99 mg/dL - - - - 127(H)   LDLDIRECT mg/dL - - - - -   HDL >39.00 mg/dL - - - - 57.90   Trlycerides 0 - 149 mg/dL - - - - 165.0(H)   Hemoglobin A1c 4.6 - 6.5 % - - - - 5.9   PHART 7.35 - 7.45 - TEST WILL BE CREDITED 7.425 7.354 -   PCO2ART 32 - 48 mmHg - TEST WILL BE CREDITED 41.1 48.4(H) -   HCO3 20.0 - 28.0 mmol/L 29.0(H) TEST WILL BE CREDITED 26.5 26.3 -   TCO2 22 - 32 mmol/L 31 - - - -   ACIDBASEDEF 0.0 - 2.0 mmol/L - TEST WILL BE CREDITED - - -   O2SAT %  76.0 TEST WILL BE CREDITED 97.7 98.6 -       Pulmonary Assessment Scores:  Pulmonary Assessment Scores    Row Name 08/13/19 1507         ADL UCSD   ADL Phase Entry     SOB Score total 57     Rest 1     Walk 3     Stairs 4     Bath 0     Dress 0     Shop 4       CAT Score   CAT Score 21       mMRC Score   mMRC Score 3            UCSD: Self-administered rating of dyspnea associated with activities of daily living (ADLs) 6-point scale (0 = "not at all" to 5 = "maximal or unable to do because of breathlessness")  Scoring Scores range from 0 to 120.  Minimally important difference is 5 units  CAT: CAT can identify the health impairment of COPD patients and is better correlated with disease progression.  CAT has a scoring range of zero to 40. The CAT score is classified  into four groups of low (less than 10), medium (10 - 20), high (21-30) and very high (31-40) based on the impact level of disease on health status. A CAT score over 10 suggests significant symptoms.  A worsening CAT score could be explained by an exacerbation, poor medication adherence, poor inhaler technique, or progression of COPD or comorbid conditions.  CAT MCID is 2 points  mMRC: mMRC (Modified Medical Research Council) Dyspnea Scale is used to assess the degree of baseline functional disability in patients of respiratory disease due to dyspnea. No minimal important difference is established. A decrease in score of 1 point or greater is considered a positive change.   Pulmonary Function Assessment:  Pulmonary Function Assessment - 08/07/19 1410      Breath   Shortness of Breath Yes;Limiting activity           Exercise Target Goals: Exercise Program Goal: Individual exercise prescription set using results from initial 6 min walk test and THRR while considering  patients activity barriers and safety.   Exercise Prescription Goal: Initial exercise prescription builds to 30-45 minutes a day of aerobic activity, 2-3 days per week.  Home exercise guidelines will be given to patient during program as part of exercise prescription that the participant will acknowledge.  Education: Aerobic Exercise & Resistance Training: - Gives group verbal and written instruction on the various components of exercise. Focuses on aerobic and resistive training programs and the benefits of this training and how to safely progress through these programs..   Education: Exercise & Equipment Safety: - Individual verbal instruction and demonstration of equipment use and safety with use of the equipment.   Pulmonary Rehab from 09/05/2019 in [facility] Cardiac and Pulmonary Rehab  Date 08/13/19  Educator [facility]  Instruction Review Code 1- Verbalizes Understanding      Education: Exercise Physiology & General Exercise  Guidelines: - Group verbal and written instruction with models to review the exercise physiology of the cardiovascular system and associated critical values. Provides general exercise guidelines with specific guidelines to those with heart or lung disease.    Education: Flexibility, Balance, Mind/Body Relaxation: Provides group verbal/written instruction on the benefits of flexibility and balance training, including mind/body exercise modes such as yoga, pilates and tai chi.  Demonstration and skill practice provided.   Activity Barriers &  Risk Stratification:  Activity Barriers & Cardiac Risk Stratification - 08/13/19 1501      Activity Barriers & Cardiac Risk Stratification   Activity Barriers Deconditioning;Joint Problems;Muscular Weakness;Shortness of Breath;Balance Concerns   r knee pain          6 Minute Walk:  6 Minute Walk    Row Name 08/13/19 1459         6 Minute Walk   Phase Initial     Distance 1035 feet     Walk Time 5 minutes     # of Rest Breaks 1  1 min     MPH 2.35     METS 2.32     RPE 13     Perceived Dyspnea  3     VO2 Peak 8.11     Symptoms Yes (comment)     Comments SOB, knee pain (5/10)     Resting HR 70 bpm     Resting BP 132/74     Resting Oxygen Saturation  98 %     Exercise Oxygen Saturation  during 6 min walk 94 %     Max Ex. HR 108 bpm     Max Ex. BP 204/70     2 Minute Post BP 160/80       Interval HR   1 Minute HR 94     2 Minute HR 108     3 Minute HR 102     4 Minute HR 102  seated     5 Minute HR 79     6 Minute HR 100     2 Minute Post HR 71     Interval Heart Rate? Yes       Interval Oxygen   Interval Oxygen? Yes     Baseline Oxygen Saturation % 98 %     1 Minute Oxygen Saturation % 97 %     1 Minute Liters of Oxygen 0 L  Room Air     2 Minute Oxygen Saturation % 94 %     2 Minute Liters of Oxygen 0 L     3 Minute Oxygen Saturation % 96 %     3 Minute Liters of Oxygen 0 L     4 Minute Oxygen Saturation % 95 %     4  Minute Liters of Oxygen 0 L     5 Minute Oxygen Saturation % 97 %     5 Minute Liters of Oxygen 0 L     6 Minute Oxygen Saturation % 96 %     6 Minute Liters of Oxygen 0 L     2 Minute Post Oxygen Saturation % 98 %     2 Minute Post Liters of Oxygen 0 L           Oxygen Initial Assessment:  Oxygen Initial Assessment - 08/07/19 1409      Home Oxygen   Home Oxygen Device None    Sleep Oxygen Prescription None    Home Exercise Oxygen Prescription None    Home at Rest Exercise Oxygen Prescription None    Compliance with Home Oxygen Use Yes      Initial 6 min Walk   Oxygen Used None      Program Oxygen Prescription   Program Oxygen Prescription None      Intervention   Short Term Goals To learn and exhibit compliance with exercise, home and travel O2 prescription;To learn and understand importance of monitoring SPO2 with pulse  oximeter and demonstrate accurate use of the pulse oximeter.;To learn and understand importance of maintaining oxygen saturations>88%;To learn and demonstrate proper pursed lip breathing techniques or other breathing techniques.;To learn and demonstrate proper use of respiratory medications    Long  Term Goals Exhibits compliance with exercise, home and travel O2 prescription;Verbalizes importance of monitoring SPO2 with pulse oximeter and return demonstration;Maintenance of O2 saturations>88%;Exhibits proper breathing techniques, such as pursed lip breathing or other method taught during program session;Compliance with respiratory medication;Demonstrates proper use of MDIs           Oxygen Re-Evaluation:  Oxygen Re-Evaluation    Row Name 08/15/19 1121 09/03/19 1156           Program Oxygen Prescription   Program Oxygen Prescription None None        Home Oxygen   Home Oxygen Device None None      Sleep Oxygen Prescription None None      Home Exercise Oxygen Prescription None None      Home at Rest Exercise Oxygen Prescription None None       Compliance with Home Oxygen Use Yes Yes        Goals/Expected Outcomes   Short Term Goals To learn and exhibit compliance with exercise, home and travel O2 prescription;To learn and understand importance of monitoring SPO2 with pulse oximeter and demonstrate accurate use of the pulse oximeter.;To learn and understand importance of maintaining oxygen saturations>88%;To learn and demonstrate proper pursed lip breathing techniques or other breathing techniques.;To learn and demonstrate proper use of respiratory medications To learn and exhibit compliance with exercise, home and travel O2 prescription;To learn and understand importance of monitoring SPO2 with pulse oximeter and demonstrate accurate use of the pulse oximeter.;To learn and understand importance of maintaining oxygen saturations>88%;To learn and demonstrate proper pursed lip breathing techniques or other breathing techniques.;To learn and demonstrate proper use of respiratory medications      Long  Term Goals Exhibits compliance with exercise, home and travel O2 prescription;Verbalizes importance of monitoring SPO2 with pulse oximeter and return demonstration;Maintenance of O2 saturations>88%;Exhibits proper breathing techniques, such as pursed lip breathing or other method taught during program session;Compliance with respiratory medication;Demonstrates proper use of MDIs Exhibits compliance with exercise, home and travel O2 prescription;Verbalizes importance of monitoring SPO2 with pulse oximeter and return demonstration;Maintenance of O2 saturations>88%;Exhibits proper breathing techniques, such as pursed lip breathing or other method taught during program session;Compliance with respiratory medication;Demonstrates proper use of MDIs      Comments Reviewed PLB technique with pt.  Talked about how it works and it's importance in maintaining their exercise saturations. Lindsie is using her PLB and finds it helpful. She does not have a pulse  oximeter so we talked about getting one.  She will look into it.      Goals/Expected Outcomes Short: Become more profiecient at using PLB.   Long: Become independent at using PLB. Short: Get pulse oximeter.  Long: Continue to work on PLB.             Oxygen Discharge (Final Oxygen Re-Evaluation):  Oxygen Re-Evaluation - 09/03/19 1156      Program Oxygen Prescription   Program Oxygen Prescription None      Home Oxygen   Home Oxygen Device None    Sleep Oxygen Prescription None    Home Exercise Oxygen Prescription None    Home at Rest Exercise Oxygen Prescription None    Compliance with Home Oxygen Use Yes      Goals/Expected Outcomes  Short Term Goals To learn and exhibit compliance with exercise, home and travel O2 prescription;To learn and understand importance of monitoring SPO2 with pulse oximeter and demonstrate accurate use of the pulse oximeter.;To learn and understand importance of maintaining oxygen saturations>88%;To learn and demonstrate proper pursed lip breathing techniques or other breathing techniques.;To learn and demonstrate proper use of respiratory medications    Long  Term Goals Exhibits compliance with exercise, home and travel O2 prescription;Verbalizes importance of monitoring SPO2 with pulse oximeter and return demonstration;Maintenance of O2 saturations>88%;Exhibits proper breathing techniques, such as pursed lip breathing or other method taught during program session;Compliance with respiratory medication;Demonstrates proper use of MDIs    Comments Aamira is using her PLB and finds it helpful. She does not have a pulse oximeter so we talked about getting one.  She will look into it.    Goals/Expected Outcomes Short: Get pulse oximeter.  Long: Continue to work on PLB.           Initial Exercise Prescription:  Initial Exercise Prescription - 08/13/19 1500      Date of Initial Exercise RX and Referring Provider   Date 08/13/19    Referring Provider [name] DO      Treadmill   MPH 1.7    Grade 0.5    Minutes 15    METs 2.42      NuStep   Level 1    SPM 80    Minutes 15    METs 2      REL-XR   Level 1    Speed 50    Minutes 15    METs 2      Prescription Details   Frequency (times per week) 2    Duration Progress to 30 minutes of continuous aerobic without signs/symptoms of physical distress      Intensity   THRR 40-80% of Max Heartrate 100-131    Ratings of Perceived Exertion 11-13    Perceived Dyspnea 0-4      Progression   Progression Continue to progress workloads to maintain intensity without signs/symptoms of physical distress.      Resistance Training   Training Prescription Yes    Weight 3 lb    Reps 10-15           Perform Capillary Blood Glucose checks as needed.  Exercise Prescription Changes:  Exercise Prescription Changes    Row Name 08/13/19 1500 08/27/19 1000 09/10/19 1300         Response to Exercise   Blood Pressure (Admit) 132/74 130/60 146/70     Blood Pressure (Exercise) 204/70  rck 168/80 136/70 148/68     Blood Pressure (Exit) 126/70 132/62 122/60     Heart Rate (Admit) 70 bpm 71 bpm 69 bpm     Heart Rate (Exercise) 108 bpm 108 bpm 97 bpm     Heart Rate (Exit) 78 bpm 80 bpm 80 bpm     Oxygen Saturation (Admit) 98 % 95 % 96 %     Oxygen Saturation (Exercise) 94 % 94 % 94 %     Oxygen Saturation (Exit) 94 % 97 % 98 %     Rating of Perceived Exertion (Exercise) '13 11 13     ' Perceived Dyspnea (Exercise) '3 3 2     ' Symptoms SOB, R knee pain 5/10 none none     Comments walk test results second full day of exercise --     Duration -- Progress to 30 minutes of  aerobic  without signs/symptoms of physical distress Continue with 30 min of aerobic exercise without signs/symptoms of physical distress.     Intensity -- THRR unchanged THRR unchanged       Progression   Progression -- Continue to progress workloads to maintain intensity without signs/symptoms of physical distress. Continue  to progress workloads to maintain intensity without signs/symptoms of physical distress.     Average METs -- 2.5 2.5       Resistance Training   Training Prescription -- Yes Yes     Weight -- 3 lb 3 lb     Reps -- 10-15 10-15       Interval Training   Interval Training -- No No       Treadmill   MPH -- 1.7 1.9     Grade -- 0.5 0.5     Minutes -- 15 15     METs -- 2.42 2.6       NuStep   Level -- 4 4     SPM -- -- 80     Minutes -- 15 15     METs -- 2.5 2.4       REL-XR   Level -- 1 --     Minutes -- 15 --            Exercise Comments:   Exercise Goals and Review:  Exercise Goals    Row Name 08/13/19 1505             Exercise Goals   Increase Physical Activity Yes       Intervention Provide advice, education, support and counseling about physical activity/exercise needs.;Develop an individualized exercise prescription for aerobic and resistive training based on initial evaluation findings, risk stratification, comorbidities and participant's personal goals.       Expected Outcomes Short Term: Attend rehab on a regular basis to increase amount of physical activity.;Long Term: Add in home exercise to make exercise part of routine and to increase amount of physical activity.;Long Term: Exercising regularly at least 3-5 days a week.       Increase Strength and Stamina Yes       Intervention Provide advice, education, support and counseling about physical activity/exercise needs.;Develop an individualized exercise prescription for aerobic and resistive training based on initial evaluation findings, risk stratification, comorbidities and participant's personal goals.       Expected Outcomes Short Term: Increase workloads from initial exercise prescription for resistance, speed, and METs.;Short Term: Perform resistance training exercises routinely during rehab and add in resistance training at home;Long Term: Improve cardiorespiratory fitness, muscular endurance and strength  as measured by increased METs and functional capacity (6MWT)       Able to understand and use rate of perceived exertion (RPE) scale Yes       Intervention Provide education and explanation on how to use RPE scale       Expected Outcomes Short Term: Able to use RPE daily in rehab to express subjective intensity level;Long Term:  Able to use RPE to guide intensity level when exercising independently       Able to understand and use Dyspnea scale Yes       Intervention Provide education and explanation on how to use Dyspnea scale       Expected Outcomes Short Term: Able to use Dyspnea scale daily in rehab to express subjective sense of shortness of breath during exertion;Long Term: Able to use Dyspnea scale to guide intensity level when exercising independently  Knowledge and understanding of Target Heart Rate Range (THRR) Yes       Intervention Provide education and explanation of THRR including how the numbers were predicted and where they are located for reference       Expected Outcomes Short Term: Able to state/look up THRR;Short Term: Able to use daily as guideline for intensity in rehab;Long Term: Able to use THRR to govern intensity when exercising independently       Able to check pulse independently Yes       Intervention Review the importance of being able to check your own pulse for safety during independent exercise;Provide education and demonstration on how to check pulse in carotid and radial arteries.       Expected Outcomes Short Term: Able to explain why pulse checking is important during independent exercise;Long Term: Able to check pulse independently and accurately       Understanding of Exercise Prescription Yes       Intervention Provide education, explanation, and written materials on patient's individual exercise prescription       Expected Outcomes Short Term: Able to explain program exercise prescription;Long Term: Able to explain home exercise prescription to exercise  independently              Exercise Goals Re-Evaluation :  Exercise Goals Re-Evaluation    Row Name 08/15/19 1121 08/27/19 1054 09/03/19 1142 09/10/19 1305       Exercise Goal Re-Evaluation   Exercise Goals Review Increase Physical Activity;Able to understand and use rate of perceived exertion (RPE) scale;Knowledge and understanding of Target Heart Rate Range (THRR);Understanding of Exercise Prescription;Increase Strength and Stamina;Able to understand and use Dyspnea scale;Able to check pulse independently Increase Physical Activity;Understanding of Exercise Prescription;Increase Strength and Stamina Increase Physical Activity;Understanding of Exercise Prescription;Increase Strength and Stamina Increase Physical Activity;Understanding of Exercise Prescription;Increase Strength and Stamina    Comments Reviewed RPE and dyspnea scales, THR and program prescription with pt today.  Pt voiced understanding and was given a copy of goals to take home. Jaid is off to a good start in rehab.  She has completed her first two full days of exercise She is already up to level 4 on the NuStep.  We will continue to monitor her progress. Kayana is doing well in rehab.  She is not a fan on the XR. We will look at moving her around.  She is planning to walk and swim while out on vacation next week. Anusha has increased her speed on TM.  Staff will follow up with her when she returns from vacation.    Expected Outcomes Short: Use RPE daily to regulate intensity. Long: Follow program prescription in THR. Short:Continue to attend regularly Long: Continue to follow program prescription Short: Walk and swimmon vacation Long; Continue to improve stamina. Short: stay active on vacation Long: increase MET level           Discharge Exercise Prescription (Final Exercise Prescription Changes):  Exercise Prescription Changes - 09/10/19 1300      Response to Exercise   Blood Pressure (Admit) 146/70    Blood Pressure  (Exercise) 148/68    Blood Pressure (Exit) 122/60    Heart Rate (Admit) 69 bpm    Heart Rate (Exercise) 97 bpm    Heart Rate (Exit) 80 bpm    Oxygen Saturation (Admit) 96 %    Oxygen Saturation (Exercise) 94 %    Oxygen Saturation (Exit) 98 %    Rating of Perceived Exertion (Exercise) 13  Perceived Dyspnea (Exercise) 2    Symptoms none    Duration Continue with 30 min of aerobic exercise without signs/symptoms of physical distress.    Intensity THRR unchanged      Progression   Progression Continue to progress workloads to maintain intensity without signs/symptoms of physical distress.    Average METs 2.5      Resistance Training   Training Prescription Yes    Weight 3 lb    Reps 10-15      Interval Training   Interval Training No      Treadmill   MPH 1.9    Grade 0.5    Minutes 15    METs 2.6      NuStep   Level 4    SPM 80    Minutes 15    METs 2.4           Nutrition:  Target Goals: Understanding of nutrition guidelines, daily intake of sodium <1539m, cholesterol <203m calories 30% from fat and 7% or less from saturated fats, daily to have 5 or more servings of fruits and vegetables.  Education: Controlling Sodium/Reading Food Labels -Group verbal and written material supporting the discussion of sodium use in heart healthy nutrition. Review and explanation with models, verbal and written materials for utilization of the food label.   Education: General Nutrition Guidelines/Fats and Fiber: -Group instruction provided by verbal, written material, models and posters to present the general guidelines for heart healthy nutrition. Gives an explanation and review of dietary fats and fiber.   Biometrics:  Pre Biometrics - 08/13/19 1505      Pre Biometrics   Height 5' 6.75" (1.695 m)    Weight 225 lb 12.8 oz (102.4 kg)    BMI (Calculated) 35.65    Single Leg Stand 10.94 seconds            Nutrition Therapy Plan and Nutrition Goals:   Nutrition  Assessments:  Nutrition Assessments - 08/13/19 1506      MEDFICTS Scores   Pre Score 49           MEDIFICTS Score Key:          ?70 Need to make dietary changes          40-70 Heart Healthy Diet         ? 40 Therapeutic Level Cholesterol Diet  Nutrition Goals Re-Evaluation:  Nutrition Goals Re-Evaluation    [place] 09/03/19 1157             Goals   Nutrition Goal Meet with dietician       Comment SaAmiayahas already to started to make some healthier changes in her diet and seeing the difference in her weight and how she is feeling.       Expected Outcome Short: Meet with dietcian Long: Continue to work on healthy changes.              Nutrition Goals Discharge (Final Nutrition Goals Re-Evaluation):  Nutrition Goals Re-Evaluation - 09/03/19 1157      Goals   Nutrition Goal Meet with dietician    Comment SaVerlynas already to started to make some healthier changes in her diet and seeing the difference in her weight and how she is feeling.    Expected Outcome Short: Meet with dietcian Long: Continue to work on healthy changes.           Psychosocial: Target Goals: Acknowledge presence or absence of significant depression and/or stress, maximize coping  skills, provide positive support system. Participant is able to verbalize types and ability to use techniques and skills needed for reducing stress and depression.   Education: Depression - Provides group verbal and written instruction on the correlation between heart/lung disease and depressed mood, treatment options, and the stigmas associated with seeking treatment.   Education: Sleep Hygiene -Provides group verbal and written instruction about how sleep can affect your health.  Define sleep hygiene, discuss sleep cycles and impact of sleep habits. Review good sleep hygiene tips.    Pulmonary Rehab from 09/05/2019 in [facility] Cardiac and Pulmonary Rehab  Date 09/05/19  Educator [facility]  Instruction Review Code 1- Verbalizes  Understanding      Education: Stress and Anxiety: - Provides group verbal and written instruction about the health risks of elevated stress and causes of high stress.  Discuss the correlation between heart/lung disease and anxiety and treatment options. Review healthy ways to manage with stress and anxiety.   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      Family Dynamics   Good Support System? Yes    Comments She can look to her son for support. Patient is not able to sleep well.      Barriers   Psychosocial barriers to participate in program The patient should benefit from training in stress management and relaxation.      Screening Interventions   Interventions Encouraged to exercise;To provide support and resources with identified psychosocial needs;Provide feedback about the scores to participant    Expected Outcomes Short Term goal: Utilizing psychosocial counselor, staff and physician to assist with identification of specific Stressors or current issues interfering with healing process. Setting desired goal for each stressor or current issue identified.;Long Term Goal: Stressors or current issues are controlled or eliminated.;Short Term goal: Identification and review with participant of any Quality of Life or Depression concerns found by scoring the questionnaire.;Long Term goal: The participant improves quality of Life and PHQ9 Scores as seen by post scores and/or verbalization of changes           Quality of Life Scores:  Scores of 19 and below usually indicate a poorer quality of life in these areas.  A difference of  2-3 points is a  clinically meaningful difference.  A difference of 2-3 points in the total score of the Quality of Life Index has been associated with significant improvement in overall quality of life, self-image, physical symptoms, and general health in studies assessing change in quality of life.  PHQ-9: Recent Review Flowsheet Data    Depression screen [facility] 2/9 08/13/2019 12/11/2018 12/05/2017 11/24/2016 11/24/2015   Decreased Interest 0 0 0 1 0   Down, Depressed, Hopeless 0 0 1 0 0   PHQ - 2 Score 0 0 1 1 0   Altered sleeping 1 0 2 0 -   Tired, decreased energy 2 0 1 2 -   Change in appetite 2 0 2 2 -   Feeling bad or failure about yourself  0 0 1 1 -   Trouble concentrating 0 0 0 0 -   Moving slowly or fidgety/restless 0 0 0 0 -  Suicidal thoughts 0 0 0 0 -   PHQ-9 Score 5 0 7 6 -   Difficult doing work/chores Somewhat difficult Not difficult at all Not difficult at all Not difficult at all -     Interpretation of Total Score  Total Score Depression Severity:  1-4 = Minimal depression, 5-9 = Mild depression, 10-14 = Moderate depression, 15-19 = Moderately severe depression, 20-27 = Severe depression   Psychosocial Evaluation and Intervention:  Psychosocial Evaluation - 08/07/19 1405      Psychosocial Evaluation & Interventions   Interventions Encouraged to exercise with the program and follow exercise prescription    Comments 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 She can look to her son for support. Patient is not able to sleep well.    Expected Outcomes Short: Continue to exercise regularly to support mental health and notify staff of any changes. Long: maintain mental health and well being through teaching of rehab or prescribed medications independently.    Continue Psychosocial Services  Follow up required by staff           Psychosocial Re-Evaluation:  Psychosocial Re-Evaluation    [place] Name 09/03/19 1146             Psychosocial Re-Evaluation    Current issues with Current Stress Concerns;Current Sleep Concerns       Comments Aryam is doing well in rehab.  She is going to the beach for the week next week to [facility]. She is looking forward to her trip to just get away for a few days.  She continues to have trouble sleeping, so I sent her information to review.       Expected Outcomes Short: Review sleep info and enjoy vacation Long; Continue to stay positive.       Interventions Encouraged to attend Pulmonary Rehabilitation for the exercise;Stress management education       Continue Psychosocial Services  Follow up required by staff              Psychosocial Discharge (Final Psychosocial Re-Evaluation):  Psychosocial Re-Evaluation - 09/03/19 1146      Psychosocial Re-Evaluation   Current issues with Current Stress Concerns;Current Sleep Concerns    Comments Colin is doing well in rehab.  She is going to the beach for the week next week to [facility]. She is looking forward to her trip to just get away for a few days.  She continues to have trouble sleeping, so I sent her information to review.    Expected Outcomes Short: Review sleep info and enjoy vacation Long; Continue to stay positive.    Interventions Encouraged to attend Pulmonary Rehabilitation for the exercise;Stress management education    Continue Psychosocial Services  Follow up required by staff           Education: Education Goals: Education classes will be provided on a weekly basis, covering required topics. Participant will state understanding/return demonstration of topics presented.  Learning Barriers/Preferences:  Learning Barriers/Preferences - 08/07/19 1403      Learning Barriers/Preferences   Learning Barriers None    Learning Preferences None           General Pulmonary Education Topics:  Infection Prevention: - Provides verbal and written material to individual with discussion of infection control including proper hand washing and  proper equipment cleaning during exercise session.   Pulmonary Rehab from 09/05/2019 in [facility] Cardiac and Pulmonary Rehab  Date 08/13/19  Educator [facility]  Instruction Review Code 1- Verbalizes Understanding      Falls Prevention: - Provides verbal and written material to individual with discussion of falls prevention and safety.   Pulmonary Rehab from 09/05/2019 in [facility] Cardiac and Pulmonary Rehab  Date 08/13/19  Educator [facility]  Instruction Review Code 1- Verbalizes Understanding      Chronic Lung Diseases: - Group verbal and written instruction to review updates, respiratory medications, advancements in procedures and treatments. Discuss use of supplemental oxygen including available portable oxygen systems, continuous and intermittent flow rates, concentrators, personal use and safety guidelines. Review proper use of inhaler and spacers. Provide informative websites for self-education.    Energy Conservation: - Provide group verbal and written instruction for methods to conserve energy, plan and organize activities. Instruct on pacing techniques, use of adaptive equipment and posture/positioning to relieve shortness of breath.   Triggers and Exacerbations: - Group verbal and written instruction to review types of environmental triggers and ways to prevent exacerbations. Discuss weather changes, air quality and the benefits of nasal washing. Review warning signs and symptoms to help prevent infections. Discuss techniques for effective airway clearance, coughing, and vibrations.   AED/CPR: - Group verbal and written instruction with the use of models to demonstrate the basic use of the AED with the basic ABC's of resuscitation.   Anatomy and Physiology of the Lungs: - Group verbal and written instruction with the use of models to provide basic lung anatomy and physiology related to function, structure and complications of lung disease.   Anatomy & Physiology of the Heart: - Group verbal and  written instruction and models provide basic cardiac anatomy and physiology, with the coronary electrical and arterial systems. Review of Valvular disease and Heart Failure   Cardiac Medications: - Group verbal and written instruction to review commonly prescribed medications for heart disease. Reviews the medication, class of the drug, and side effects.   Other: -Provides group and verbal instruction on various topics (see comments)   Knowledge Questionnaire Score:  Knowledge Questionnaire Score - 08/13/19 1506      Knowledge Questionnaire Score   Pre Score 11/18 Education Focus; Pulm A&P, O2 safety            Core Components/Risk Factors/Patient Goals at Admission:  Personal Goals and Risk Factors at Admission - 08/13/19 1506      Core Components/Risk Factors/Patient Goals on Admission    Weight Management Yes;Weight Loss;Obesity    Intervention Weight Management: Develop a combined nutrition and exercise program designed to reach desired caloric intake, while maintaining appropriate intake of nutrient and fiber, sodium and fats, and appropriate energy expenditure required for the weight goal.;Weight Management: Provide education and appropriate resources to help participant work on and attain dietary goals.;Weight Management/Obesity: Establish reasonable short term and long term weight goals.;Obesity: Provide education and appropriate resources to help participant work on and attain dietary goals.    Admit Weight 225 lb 12.8 oz (102.4 kg)    Goal Weight: Short Term 220 lb (99.8 kg)    Goal Weight: Long Term 215 lb (97.5 kg)    Expected Outcomes Long Term: Adherence to nutrition and physical activity/exercise program aimed toward attainment of established weight goal;Short Term: Continue to assess and modify interventions until short term weight is achieved;Weight Loss: Understanding of general recommendations for a balanced deficit meal plan, which promotes 1-2 lb weight loss per  week and includes a negative energy balance of [phone] kcal/d;Understanding recommendations for meals to include 15-35% energy as protein, 25-35% energy  from fat, 35-60% energy from carbohydrates, less than 262m of dietary cholesterol, 20-35 gm of total fiber daily;Understanding of distribution of calorie intake throughout the day with the consumption of 4-5 meals/snacks    Improve shortness of breath with ADL's Yes    Intervention Provide education, individualized exercise plan and daily activity instruction to help decrease symptoms of SOB with activities of daily living.    Expected Outcomes Short Term: Improve cardiorespiratory fitness to achieve a reduction of symptoms when performing ADLs;Long Term: Be able to perform more ADLs without symptoms or delay the onset of symptoms    Lipids Yes    Intervention Provide education and support for participant on nutrition & aerobic/resistive exercise along with prescribed medications to achieve LDL <729m HDL >4067m   Expected Outcomes Short Term: Participant states understanding of desired cholesterol values and is compliant with medications prescribed. Participant is following exercise prescription and nutrition guidelines.;Long Term: Cholesterol controlled with medications as prescribed, with individualized exercise RX and with personalized nutrition plan. Value goals: LDL < 71m76mDL > 40 mg.           Education:Diabetes - Individual verbal and written instruction to review signs/symptoms of diabetes, desired ranges of glucose level fasting, after meals and with exercise. Acknowledge that pre and post exercise glucose checks will be done for 3 sessions at entry of program.   Education: Know Your Numbers and Risk Factors: -Group verbal and written instruction about important numbers in your health.  Discussion of what are risk factors and how they play a role in the disease process.  Review of Cholesterol, Blood Pressure, Diabetes, and BMI and the  role they play in your overall health.   Core Components/Risk Factors/Patient Goals Review:   Goals and Risk Factor Review    Row Name 09/03/19 1144             Core Components/Risk Factors/Patient Goals Review   Personal Goals Review Weight Management/Obesity;Improve shortness of breath with ADL's;Hypertension       Review SandAmayranydoing well in rehab.  Her weight is trending down.  She just doesn't like our scale compared to hers at home. Her blood pressures are good in class and she does not check them at home.  Her doctor wants her to check it.  Her breathing is doing well.       Expected Outcomes Short: Start checking blood pressures. Long: Continue to monitor risk factors.              Core Components/Risk Factors/Patient Goals at Discharge (Final Review):   Goals and Risk Factor Review - 09/03/19 1144      Core Components/Risk Factors/Patient Goals Review   Personal Goals Review Weight Management/Obesity;Improve shortness of breath with ADL's;Hypertension    Review SandAnalysedoing well in rehab.  Her weight is trending down.  She just doesn't like our scale compared to hers at home. Her blood pressures are good in class and she does not check them at home.  Her doctor wants her to check it.  Her breathing is doing well.    Expected Outcomes Short: Start checking blood pressures. Long: Continue to monitor risk factors.           ITP Comments:  ITP Comments    Row Name 08/07/19 1413 08/13/19 1458 08/14/19 0653 08/15/19 1120 09/11/19 1050   ITP Comments Virtual Visit completed. Patient informed on EP and RD appointment and 6 Minute walk test. Patient also informed of patient health  questionnaires on My Chart. Patient Verbalizes understanding. Visit diagnosis can be found in [facility] 08/05/2019. Completed 6MWT and gym orientation.  Initial ITP created and sent for review to Dr. Emily Filbert, Medical Director. 30 Day review completed. Medical Director ITP review done, changes made as  directed, and signed approval by Medical Director. First full day of exercise!  Patient was oriented to gym and equipment including functions, settings, policies, and procedures.  Patient's individual exercise prescription and treatment plan were reviewed.  All starting workloads were established based on the results of the 6 minute walk test done at initial orientation visit.  The plan for exercise progression was also introduced and progression will be customized based on patient's performance and goals. 30 Day review completed. Medical Director ITP review done, changes made as directed, and signed approval by Medical Director.          Comments:

## 2019-09-16 ENCOUNTER — Encounter: Payer: Self-pay | Admitting: Acute Care

## 2019-09-16 ENCOUNTER — Other Ambulatory Visit: Payer: Self-pay

## 2019-09-16 ENCOUNTER — Ambulatory Visit (INDEPENDENT_AMBULATORY_CARE_PROVIDER_SITE_OTHER): Payer: Medicare HMO | Admitting: Acute Care

## 2019-09-16 DIAGNOSIS — J449 Chronic obstructive pulmonary disease, unspecified: Secondary | ICD-10-CM | POA: Diagnosis not present

## 2019-09-16 MED ORDER — ALBUTEROL SULFATE HFA 108 (90 BASE) MCG/ACT IN AERS: INHALATION_SPRAY | RESPIRATORY_TRACT | 3 refills | Status: DC

## 2019-09-16 MED ORDER — TRELEGY ELLIPTA 100-62.5-25 MCG/INH IN AEPB: 1.0000 | INHALATION_SPRAY | Freq: Every day | RESPIRATORY_TRACT | 0 refills | Status: DC

## 2019-09-16 NOTE — Progress Notes (Signed)
Virtual Visit via Telephone Note  I connected with Hannah Green on 09/16/19 at  9:30 AM EDT by telephone and verified that I am speaking with the correct person using two identifiers.  Location: Patient: At home Provider: [place], [place], [place], [address]   I discussed the limitations, risks, security and privacy concerns of performing an evaluation and management service by telephone and the availability of in person appointments. I also discussed with the patient that there may be a patient responsible charge related to this service. The patient expressed understanding and agreed to proceed.  Synopsis: Hannah Green a 74 y.o.femaleformer smokerwith with a history of COPD and Stage 1b non-small cell lung cancer status post right upper and middle lobectomies with lymph node dissection in February 2020( Dr. Roxan Hockey).Continued treatment is observation with oncology. [facility]). She is followed by Dr. Valeta Harms. Maintenance: Brextri ( Therapeutic Trial 6/7)>> too expensive Trelegy Trial ( 7/19) Aero Chamber Rescue : Ventolin   History of Present Illness: Pt. Presents for follow up. We did a therapeutic trial of Trelegy. She had originally tried [place], but she could not afford it. Her insurance does cover Trelegy. She feels the  Trelegy has been a big help. She Would like a prescription . She is using her rescue inhaler occasionally, usually with high humidity.  She needs another one sent in. She states she has fewer secretions, and she is better able to do her pulmonary rehab. She feels her muscles of respiration are stronger. She is really enjoying going for rehab. She denies fever, chest pain, orthopnea or hemoptysis.     Observations/Objective: 08/13/2019 CTA No definite evidence of pulmonary embolus. Status post right upper and middle lobectomy. Stable small bilateral pulmonary nodules are noted compared to prior exam. Large sliding-type hiatal  hernia.  12/2019:CT Chest with Contrast Lungs/Pleura: No pneumothorax. No pleural effusion. Status post right middle and right upper lobectomies. Moderate centrilobular and paraseptal emphysema. Stable calcified subcentimeter medial basilar right lower lobe granuloma. Tiny 2 mm peripheral upper right lower lobe solid pulmonary nodule (series 7/image 45) and clustered 4 mm (series 7/image 52) and 3 mm (series 7/image 46) medial left upper lobe solid pulmonary nodules, all unchanged. No new significant pulmonary nodules. No acute consolidative airspace disease or lung masses. Mild subpleural reticulation in peripheral basilar right lower lobe is unchanged, favor post treatment change.  No findings suspicious for metastatic disease in the chest. Tiny pulmonary nodules are all stable. Stable small focus of soft tissue density anterior to the left brachiocephalic vein, potentially related to the right sternoclavicular joint capsule. 2. No evidence of local tumor recurrence in the right lung status post right middle and right upper lobectomy.  PFT's  Severe obstructive defect, significant BD response   Assessment and Plan: COPD Stage 3 Severe Therapeutic Trial of Trelegy>> went well, would like prescription Plan We will prescribe Trelegy Take 1 puff , once daily Rinse mouth after use Continue Albuterol Rescue as needed for shortness of breath  Or wheezing . Call for an appointment if you are using your rescue inhaler more frequently. Continue pulmonary rehab, as this seems to be helping you. Follow up in 6 months with Dr. Valeta Harms of [facility] NP. Note your daily symptoms > remember "red flags" for COPD:  Increase in cough, increase in sputum production, increase in shortness of breath or activity intolerance. If you notice these symptoms, please call to be seen.   Follow up with oncology as scheduled Please contact office for  sooner follow up if symptoms do not improve or worsen or seek  emergency care   Follow Up Instructions: Follow up in 6 months with Dr. Valeta Harms Call us sooner  If you need [place] sooner.     I discussed the assessment and treatment plan with the patient. The patient was provided an opportunity to ask questions and all were answered. The patient agreed with the plan and demonstrated an understanding of the instructions.   The patient was advised to call back or seek an in-person evaluation if the symptoms worsen or if the condition fails to improve as anticipated.  I provided 25 minutes of non-face-to-face time during this encounter.   Magdalen Spatz, NP 09/16/2019 11:05 AM

## 2019-09-16 NOTE — Patient Instructions (Addendum)
It is good to see you today. We will send in a prescription for Trelegy. Remember 1 puff , once daily. Rinse mouth after use. Continue Albuterol Rescue as needed for shortness of breath  Or wheezing . Call for an appointment if you are using your rescue inhaler more frequently. Continue pulmonary rehab, as this seems to be helping you. Follow up in 6 months with Dr. Valeta Harms of [place] NP. Follow up with oncology as scheduled Please contact office for sooner follow up if symptoms do not improve or worsen or seek emergency care

## 2019-09-18 DIAGNOSIS — L814 Other melanin hyperpigmentation: Secondary | ICD-10-CM | POA: Diagnosis not present

## 2019-09-18 DIAGNOSIS — D485 Neoplasm of uncertain behavior of skin: Secondary | ICD-10-CM | POA: Diagnosis not present

## 2019-09-18 DIAGNOSIS — L578 Other skin changes due to chronic exposure to nonionizing radiation: Secondary | ICD-10-CM | POA: Diagnosis not present

## 2019-09-18 DIAGNOSIS — Z85828 Personal history of other malignant neoplasm of skin: Secondary | ICD-10-CM | POA: Diagnosis not present

## 2019-09-18 DIAGNOSIS — D225 Melanocytic nevi of trunk: Secondary | ICD-10-CM | POA: Diagnosis not present

## 2019-09-18 DIAGNOSIS — L820 Inflamed seborrheic keratosis: Secondary | ICD-10-CM | POA: Diagnosis not present

## 2019-09-18 DIAGNOSIS — L570 Actinic keratosis: Secondary | ICD-10-CM | POA: Diagnosis not present

## 2019-09-18 DIAGNOSIS — L821 Other seborrheic keratosis: Secondary | ICD-10-CM | POA: Diagnosis not present

## 2019-09-19 ENCOUNTER — Other Ambulatory Visit: Payer: Self-pay

## 2019-09-19 ENCOUNTER — Encounter: Payer: Medicare HMO | Admitting: *Deleted

## 2019-09-19 DIAGNOSIS — J449 Chronic obstructive pulmonary disease, unspecified: Secondary | ICD-10-CM | POA: Diagnosis not present

## 2019-09-19 NOTE — Progress Notes (Signed)
Daily Session Note  Patient Details  Name: Hannah Green MRN: [id number] Date of Birth: 1945/11/04 Referring Provider:     Pulmonary Rehab from 08/13/2019 in [facility] Cardiac and Pulmonary Rehab  Referring Provider [name] DO      Encounter Date: 09/19/2019  Check In:  Session Check In - 09/19/19 1113      Check-In   Supervising physician immediately available to respond to emergencies See telemetry face sheet for immediately available ER MD    Location [facility]-Cardiac & Pulmonary Rehab    Staff Present Renita Papa, RN Margurite Auerbach, MS Exercise Physiologist;Amanda Oletta Darter, BA, ACSM CEP, Exercise Physiologist;Jessica Luan Pulling, MA, RCEP, CCRP, CCET    Virtual Visit No    Medication changes reported     No    Fall or balance concerns reported    No    Warm-up and Cool-down Performed on first and last piece of equipment    Resistance Training Performed Yes    VAD Patient? No    PAD/SET Patient? No      Pain Assessment   Currently in Pain? No/denies              Social History   Tobacco Use  Smoking Status Former Smoker  . Packs/day: 1.50  . Years: 50.00  . Pack years: 75.00  . Types: Cigarettes  . Quit date: 04/01/2011  . Years since quitting: 8.4  Smokeless Tobacco Never Used  Tobacco Comment   has stopped but has started again in "crisis"    Goals Met:  Independence with exercise equipment Exercise tolerated well No report of cardiac concerns or symptoms Strength training completed today  Goals Unmet:  Not Applicable  Comments: Pt able to follow exercise prescription today without complaint.  Will continue to monitor for progression.    Dr. Emily Filbert is Medical Director for [place] and [facility].

## 2019-09-20 ENCOUNTER — Encounter: Payer: Medicare HMO | Admitting: *Deleted

## 2019-09-20 ENCOUNTER — Other Ambulatory Visit: Payer: Self-pay

## 2019-09-20 DIAGNOSIS — J449 Chronic obstructive pulmonary disease, unspecified: Secondary | ICD-10-CM | POA: Diagnosis not present

## 2019-09-20 NOTE — Progress Notes (Signed)
Daily Session Note  Patient Details  Name: Hannah Green MRN: [id number] Date of Birth: [birth year] Referring Provider:     Pulmonary Rehab from 08/13/2019 in [facility] Cardiac and Pulmonary Rehab  Referring Provider [name] DO      Encounter Date: 09/20/2019  Check In:  Session Check In - 09/20/19 0956      Check-In   Supervising physician immediately available to respond to emergencies See telemetry face sheet for immediately available ER MD    Location [facility]-Cardiac & Pulmonary Rehab    Staff Present Renita Papa, RN BSN;Jessica Luan Pulling, MA, RCEP, CCRP, CCET;Melissa Gordon RDN, LDN    Virtual Visit No    Medication changes reported     No    Fall or balance concerns reported    No    Warm-up and Cool-down Performed on first and last piece of equipment    Resistance Training Performed Yes    VAD Patient? No    PAD/SET Patient? No      Pain Assessment   Currently in Pain? No/denies              Social History   Tobacco Use  Smoking Status Former Smoker  . Packs/day: 1.50  . Years: 50.00  . Pack years: 75.00  . Types: Cigarettes  . Quit date: 04/01/2011  . Years since quitting: 8.4  Smokeless Tobacco Never Used  Tobacco Comment   has stopped but has started again in "crisis"    Goals Met:  Independence with exercise equipment Exercise tolerated well No report of cardiac concerns or symptoms Strength training completed today  Goals Unmet:  Not Applicable  Comments: Pt able to follow exercise prescription today without complaint.  Will continue to monitor for progression.    Dr. Emily Filbert is Medical Director for [place] and [facility].

## 2019-09-23 ENCOUNTER — Ambulatory Visit (HOSPITAL_COMMUNITY): Payer: Medicare HMO | Attending: Internal Medicine

## 2019-09-23 ENCOUNTER — Telehealth: Payer: Self-pay | Admitting: Acute Care

## 2019-09-23 ENCOUNTER — Other Ambulatory Visit: Payer: Self-pay

## 2019-09-23 DIAGNOSIS — R0600 Dyspnea, unspecified: Secondary | ICD-10-CM | POA: Insufficient documentation

## 2019-09-23 DIAGNOSIS — R0789 Other chest pain: Secondary | ICD-10-CM | POA: Insufficient documentation

## 2019-09-23 DIAGNOSIS — R0609 Other forms of dyspnea: Secondary | ICD-10-CM

## 2019-09-23 LAB — ECHOCARDIOGRAM COMPLETE
Area-P 1/2: 3.06 cm2
S' Lateral: 2.9 cm

## 2019-09-23 NOTE — Telephone Encounter (Signed)
Attempted to call pt but unable to reach. Left message for her to return call. 

## 2019-09-24 ENCOUNTER — Encounter: Payer: Medicare HMO | Admitting: *Deleted

## 2019-09-24 DIAGNOSIS — J449 Chronic obstructive pulmonary disease, unspecified: Secondary | ICD-10-CM

## 2019-09-24 MED ORDER — TRELEGY ELLIPTA 100-62.5-25 MCG/INH IN AEPB: 1.0000 | INHALATION_SPRAY | Freq: Every day | RESPIRATORY_TRACT | 5 refills | Status: DC

## 2019-09-24 NOTE — Telephone Encounter (Signed)
Pt returning missed call. States she needs a 30 day and not 90 day of trelegy. Gives verbal consent to leave detailed vm if no answer. Can be reached at [phone]

## 2019-09-24 NOTE — Telephone Encounter (Signed)
Rx for Trelegy has been sent to pharmacy for pt. Called and spoke with pt letting her know this had been done and she verbalized understanding. Nothing further needed.

## 2019-09-24 NOTE — Progress Notes (Signed)
Daily Session Note  Patient Details  Name: GOLDYE TOURANGEAU MRN: [id number] Date of Birth: [birth year] Referring Provider:     Pulmonary Rehab from 08/13/2019 in [facility] Cardiac and Pulmonary Rehab  Referring Provider [name] DO      Encounter Date: 09/24/2019  Check In:  Session Check In - 09/24/19 1131      Check-In   Supervising physician immediately available to respond to emergencies See telemetry face sheet for immediately available ER MD    Location [facility]-Cardiac & Pulmonary Rehab    Staff Present Heath Lark, RN, BSN, CCRP;Melissa [place] RDN, LDN;Joseph [employer], [place], ACSM CEP, Exercise Physiologist    Virtual Visit No    Medication changes reported     No    Fall or balance concerns reported    No    Warm-up and Cool-down Performed on first and last piece of equipment    Resistance Training Performed Yes    VAD Patient? No    PAD/SET Patient? No      Pain Assessment   Currently in Pain? No/denies              Social History   Tobacco Use  Smoking Status Former Smoker  . Packs/day: 1.50  . Years: 50.00  . Pack years: 75.00  . Types: Cigarettes  . Quit date: 04/01/2011  . Years since quitting: 8.4  Smokeless Tobacco Never Used  Tobacco Comment   has stopped but has started again in "crisis"    Goals Met:  Proper associated with RPD/PD & O2 Sat Independence with exercise equipment Exercise tolerated well No report of cardiac concerns or symptoms  Goals Unmet:  Not Applicable  Comments: Pt able to follow exercise prescription today without complaint.  Will continue to monitor for progression.  Reviewed home exercise with pt today.  Pt plans to walk/staff videos for exercise.  Reviewed THR, pulse, RPE, sign and symptoms, pulse oximetery and when to call 911 or MD.  Also discussed weather considerations and indoor options.  Pt voiced understanding.   Dr. Emily Filbert is Medical Director for [place] and  [facility].

## 2019-09-25 ENCOUNTER — Ambulatory Visit (HOSPITAL_COMMUNITY)
Admission: RE | Admit: 2019-09-25 | Discharge: 2019-09-25 | Disposition: A | Discharge status: Home / Self Care | Payer: Medicare HMO | Source: Ambulatory Visit | Attending: Cardiology | Admitting: Cardiology

## 2019-09-25 ENCOUNTER — Inpatient Hospital Stay (HOSPITAL_COMMUNITY): Admission: RE | Admit: 2019-09-25 | Payer: Medicare HMO | Source: Ambulatory Visit

## 2019-09-25 ENCOUNTER — Other Ambulatory Visit: Payer: Self-pay

## 2019-09-25 DIAGNOSIS — M79661 Pain in right lower leg: Secondary | ICD-10-CM | POA: Insufficient documentation

## 2019-09-25 DIAGNOSIS — M7989 Other specified soft tissue disorders: Secondary | ICD-10-CM | POA: Diagnosis not present

## 2019-09-26 ENCOUNTER — Encounter: Payer: Medicare HMO | Admitting: *Deleted

## 2019-09-26 DIAGNOSIS — J449 Chronic obstructive pulmonary disease, unspecified: Secondary | ICD-10-CM | POA: Diagnosis not present

## 2019-09-26 NOTE — Progress Notes (Signed)
Daily Session Note  Patient Details  Name: Hannah Green MRN: [id number] Date of Birth: 06/24/1945 Referring Provider:     Pulmonary Rehab from 08/13/2019 in [facility] Cardiac and Pulmonary Rehab  Referring Provider [name] DO      Encounter Date: 09/26/2019  Check In:  Session Check In - 09/26/19 1044      Check-In   Supervising physician immediately available to respond to emergencies See telemetry face sheet for immediately available ER MD    Location [facility]-Cardiac & Pulmonary Rehab    Staff Present Renita Papa, RN BSN;Melissa Caiola RDN, Rowe Pavy, BA, ACSM CEP, Exercise Physiologist;Joseph Tessie Fass RCP,RRT,BSRT    Virtual Visit No    Medication changes reported     No    Fall or balance concerns reported    No    Warm-up and Cool-down Performed on first and last piece of equipment    Resistance Training Performed Yes    VAD Patient? No    PAD/SET Patient? No      Pain Assessment   Currently in Pain? No/denies              Social History   Tobacco Use  Smoking Status Former Smoker  . Packs/day: 1.50  . Years: 50.00  . Pack years: 75.00  . Types: Cigarettes  . Quit date: 04/01/2011  . Years since quitting: 8.4  Smokeless Tobacco Never Used  Tobacco Comment   has stopped but has started again in "crisis"    Goals Met:  Independence with exercise equipment Exercise tolerated well No report of cardiac concerns or symptoms Strength training completed today  Goals Unmet:  Not Applicable  Comments: Pt able to follow exercise prescription today without complaint.  Will continue to monitor for progression.    Dr. Emily Filbert is Medical Director for [place] and [facility].

## 2019-10-01 ENCOUNTER — Other Ambulatory Visit: Payer: Self-pay

## 2019-10-01 ENCOUNTER — Encounter: Payer: Medicare HMO | Attending: Pulmonary Disease | Admitting: *Deleted

## 2019-10-01 DIAGNOSIS — J449 Chronic obstructive pulmonary disease, unspecified: Secondary | ICD-10-CM | POA: Diagnosis not present

## 2019-10-01 NOTE — Progress Notes (Signed)
Daily Session Note  Patient Details  Name: CHARMAYNE ODELL MRN: [id number] Date of Birth: 01/02/1946 Referring Provider:     Pulmonary Rehab from 08/13/2019 in [facility] Cardiac and Pulmonary Rehab  Referring Provider [name] DO      Encounter Date: 10/01/2019  Check In:  Session Check In - 10/01/19 1133      Check-In   Supervising physician immediately available to respond to emergencies See telemetry face sheet for immediately available ER MD    Location [facility]-Cardiac & Pulmonary Rehab    Staff Present Heath Lark, RN, BSN, CCRP;Melissa [place] RDN, LDN;Joseph [employer], [place], ACSM CEP, Exercise Physiologist    Virtual Visit No    Medication changes reported     No    Fall or balance concerns reported    No    Warm-up and Cool-down Performed on first and last piece of equipment    Resistance Training Performed Yes    VAD Patient? No    PAD/SET Patient? No      Pain Assessment   Currently in Pain? No/denies              Social History   Tobacco Use  Smoking Status Former Smoker  . Packs/day: 1.50  . Years: 50.00  . Pack years: 75.00  . Types: Cigarettes  . Quit date: 04/01/2011  . Years since quitting: 8.5  Smokeless Tobacco Never Used  Tobacco Comment   has stopped but has started again in "crisis"    Goals Met:  Proper associated with RPD/PD & O2 Sat Independence with exercise equipment Exercise tolerated well No report of cardiac concerns or symptoms  Goals Unmet:  Not Applicable  Comments: Pt able to follow exercise prescription today without complaint.  Will continue to monitor for progression.    Dr. Emily Filbert is Medical Director for [place] and [facility].

## 2019-10-03 ENCOUNTER — Encounter: Payer: Medicare HMO | Admitting: *Deleted

## 2019-10-03 ENCOUNTER — Telehealth: Payer: Self-pay | Admitting: *Deleted

## 2019-10-03 ENCOUNTER — Other Ambulatory Visit: Payer: Self-pay

## 2019-10-03 DIAGNOSIS — J449 Chronic obstructive pulmonary disease, unspecified: Secondary | ICD-10-CM

## 2019-10-03 NOTE — Progress Notes (Signed)
Daily Session Note  Patient Details  Name: Hannah Green MRN: [id number] Date of Birth: [birth year] Referring Provider:     Pulmonary Rehab from 08/13/2019 in [facility] Cardiac and Pulmonary Rehab  Referring Provider [name] DO      Encounter Date: 10/03/2019  Check In:  Session Check In - 10/03/19 1114      Check-In   Supervising physician immediately available to respond to emergencies See telemetry face sheet for immediately available ER MD    Location [facility]-Cardiac & Pulmonary Rehab    Staff Present Renita Papa, RN BSN;Joseph Lou Miner, [place] Exercise Physiologist;Melissa [place] Pillar RDN, LDN    Virtual Visit No    Medication changes reported     No    Fall or balance concerns reported    No    Warm-up and Cool-down Performed on first and last piece of equipment    Resistance Training Performed Yes    VAD Patient? No    PAD/SET Patient? No      Pain Assessment   Currently in Pain? No/denies              Social History   Tobacco Use  Smoking Status Former Smoker  . Packs/day: 1.50  . Years: 50.00  . Pack years: 75.00  . Types: Cigarettes  . Quit date: 04/01/2011  . Years since quitting: 8.5  Smokeless Tobacco Never Used  Tobacco Comment   has stopped but has started again in "crisis"    Goals Met:  Independence with exercise equipment Exercise tolerated well No report of cardiac concerns or symptoms Strength training completed today  Goals Unmet:  Not Applicable  Comments: Pt able to follow exercise prescription today without complaint.  Will continue to monitor for progression.    Dr. Emily Filbert is Medical Director for [place] and [facility].

## 2019-10-03 NOTE — Telephone Encounter (Signed)
Released to mychart.  Left detailed message on voicemail of results any question may call back

## 2019-10-03 NOTE — Telephone Encounter (Signed)
-----   Message from Leonie Man, MD sent at 09/29/2019  3:03 AM EDT ----- Kermit Balo news: No evidence of deep vein thrombosis in the right leg and no evidence of any indirect obstruction.  No evidence of left-sided DVT.  Glenetta Hew, MD

## 2019-10-09 ENCOUNTER — Encounter: Payer: Self-pay | Admitting: *Deleted

## 2019-10-09 DIAGNOSIS — J449 Chronic obstructive pulmonary disease, unspecified: Secondary | ICD-10-CM

## 2019-10-09 NOTE — Progress Notes (Signed)
Pulmonary Individual Treatment Plan  Patient Details  Name: ILLYRIA SOBOCINSKI MRN: [id number] Date of Birth: 12-27-1945 Referring Provider:     Pulmonary Rehab from 08/13/2019 in [facility] Cardiac and Pulmonary Rehab  Referring Provider [name] DO      Initial Encounter Date:    Pulmonary Rehab from 08/13/2019 in [facility] Cardiac and Pulmonary Rehab  Date 08/13/19      Visit Diagnosis: Stage 3 severe COPD by GOLD classification ([place])  Patient's Home Medications on Admission:  Current Outpatient Medications:  .  albuterol (VENTOLIN HFA) 108 (90 Base) MCG/ACT inhaler, TAKE 2 PUFFS BY MOUTH EVERY 6 HOURS AS NEEDED FOR WHEEZE OR SHORTNESS OF BREATH, Disp: 8.5 g, Rfl: 3 .  aspirin EC 81 MG tablet, Take 1 tablet (81 mg total) by mouth daily., Disp: 90 tablet, Rfl: 3 .  atorvastatin (LIPITOR) 10 MG tablet, Take 1 tablet (10 mg total) by mouth daily., Disp: 90 tablet, Rfl: 3 .  cholecalciferol (VITAMIN D3) 25 MCG (1000 UT) tablet, Take 1,000 Units by mouth daily., Disp: , Rfl:  .  Fluticasone-Umeclidin-Vilant (TRELEGY ELLIPTA) 100-62.5-25 MCG/INH AEPB, Inhale 1 puff into the lungs daily., Disp: 60 each, Rfl: 5  Past Medical History: Past Medical History:  Diagnosis Date  . Breast cancer ([place]) 1999  . Cataract 2017   resolved with surgery  . COPD (chronic obstructive pulmonary disease) ([place])   . Coronary artery disease   . GERD (gastroesophageal reflux disease)   . Hyperlipidemia   . Postoperative atrial fibrillation ([place]) 03/2018  . Rheumatoid arthritis(714.0)   . Squamous cell carcinoma of lung, stage I, right ([place]) 2020   Status post right upper and middle lobectomy    Tobacco Use: Social History   Tobacco Use  Smoking Status Former Smoker  . Packs/day: 1.50  . Years: 50.00  . Pack years: 75.00  . Types: Cigarettes  . Quit date: 04/01/2011  . Years since quitting: 8.5  Smokeless Tobacco Never Used  Tobacco Comment   has stopped but has started again in "crisis"     Labs: Recent Review Flowsheet Data    Labs for ITP Cardiac and Pulmonary Rehab Latest Ref Rng & Units 04/10/2018 04/19/2018 04/23/2018 04/24/2018 12/11/2018   Cholestrol 0 - 200 mg/dL - - - - 218(H)   LDLCALC 0 - 99 mg/dL - - - - 127(H)   LDLDIRECT mg/dL - - - - -   HDL >39.00 mg/dL - - - - 57.90   Trlycerides 0 - 149 mg/dL - - - - 165.0(H)   Hemoglobin A1c 4.6 - 6.5 % - - - - 5.9   PHART 7.35 - 7.45 - TEST WILL BE CREDITED 7.425 7.354 -   PCO2ART 32 - 48 mmHg - TEST WILL BE CREDITED 41.1 48.4(H) -   HCO3 20.0 - 28.0 mmol/L 29.0(H) TEST WILL BE CREDITED 26.5 26.3 -   TCO2 22 - 32 mmol/L 31 - - - -   ACIDBASEDEF 0.0 - 2.0 mmol/L - TEST WILL BE CREDITED - - -   O2SAT % 76.0 TEST WILL BE CREDITED 97.7 98.6 -       Pulmonary Assessment Scores:  Pulmonary Assessment Scores    Row Name 08/13/19 1507         ADL UCSD   ADL Phase Entry     SOB Score total 57     Rest 1     Walk 3     Stairs 4     Bath 0  Dress 0     Shop 4       CAT Score   CAT Score 21       mMRC Score   mMRC Score 3            UCSD: Self-administered rating of dyspnea associated with activities of daily living (ADLs) 6-point scale (0 = "not at all" to 5 = "maximal or unable to do because of breathlessness")  Scoring Scores range from 0 to 120.  Minimally important difference is 5 units  CAT: CAT can identify the health impairment of COPD patients and is better correlated with disease progression.  CAT has a scoring range of zero to 40. The CAT score is classified into four groups of low (less than 10), medium (10 - 20), high (21-30) and very high (31-40) based on the impact level of disease on health status. A CAT score over 10 suggests significant symptoms.  A worsening CAT score could be explained by an exacerbation, poor medication adherence, poor inhaler technique, or progression of COPD or comorbid conditions.  CAT MCID is 2 points  mMRC: mMRC (Modified Medical Research Council) Dyspnea  Scale is used to assess the degree of baseline functional disability in patients of respiratory disease due to dyspnea. No minimal important difference is established. A decrease in score of 1 point or greater is considered a positive change.   Pulmonary Function Assessment:  Pulmonary Function Assessment - 08/07/19 1410      Breath   Shortness of Breath Yes;Limiting activity           Exercise Target Goals: Exercise Program Goal: Individual exercise prescription set using results from initial 6 min walk test and THRR while considering  patient's activity barriers and safety.   Exercise Prescription Goal: Initial exercise prescription builds to 30-45 minutes a day of aerobic activity, 2-3 days per week.  Home exercise guidelines will be given to patient during program as part of exercise prescription that the participant will acknowledge.  Education: Aerobic Exercise & Resistance Training: - Gives group verbal and written instruction on the various components of exercise. Focuses on aerobic and resistive training programs and the benefits of this training and how to safely progress through these programs..   Education: Exercise & Equipment Safety: - Individual verbal instruction and demonstration of equipment use and safety with use of the equipment.   Pulmonary Rehab from 09/26/2019 in [facility] Cardiac and Pulmonary Rehab  Date 08/13/19  Educator [facility]  Instruction Review Code 1- Verbalizes Understanding      Education: Exercise Physiology & General Exercise Guidelines: - Group verbal and written instruction with models to review the exercise physiology of the cardiovascular system and associated critical values. Provides general exercise guidelines with specific guidelines to those with heart or lung disease.    Education: Flexibility, Balance, Mind/Body Relaxation: Provides group verbal/written instruction on the benefits of flexibility and balance training, including mind/body  exercise modes such as yoga, pilates and tai chi.  Demonstration and skill practice provided.   Activity Barriers & Risk Stratification:  Activity Barriers & Cardiac Risk Stratification - 08/13/19 1501      Activity Barriers & Cardiac Risk Stratification   Activity Barriers Deconditioning;Joint Problems;Muscular Weakness;Shortness of Breath;Balance Concerns   r knee pain          6 Minute Walk:  6 Minute Walk    Row Name 08/13/19 1459         6 Minute Walk   Phase Initial  Distance 1035 feet     Walk Time 5 minutes     # of Rest Breaks 1  1 min     MPH 2.35     METS 2.32     RPE 13     Perceived Dyspnea  3     VO2 Peak 8.11     Symptoms Yes (comment)     Comments SOB, knee pain (5/10)     Resting HR 70 bpm     Resting BP 132/74     Resting Oxygen Saturation  98 %     Exercise Oxygen Saturation  during 6 min walk 94 %     Max Ex. HR 108 bpm     Max Ex. BP 204/70     2 Minute Post BP 160/80       Interval HR   1 Minute HR 94     2 Minute HR 108     3 Minute HR 102     4 Minute HR 102  seated     5 Minute HR 79     6 Minute HR 100     2 Minute Post HR 71     Interval Heart Rate? Yes       Interval Oxygen   Interval Oxygen? Yes     Baseline Oxygen Saturation % 98 %     1 Minute Oxygen Saturation % 97 %     1 Minute Liters of Oxygen 0 L  Room Air     2 Minute Oxygen Saturation % 94 %     2 Minute Liters of Oxygen 0 L     3 Minute Oxygen Saturation % 96 %     3 Minute Liters of Oxygen 0 L     4 Minute Oxygen Saturation % 95 %     4 Minute Liters of Oxygen 0 L     5 Minute Oxygen Saturation % 97 %     5 Minute Liters of Oxygen 0 L     6 Minute Oxygen Saturation % 96 %     6 Minute Liters of Oxygen 0 L     2 Minute Post Oxygen Saturation % 98 %     2 Minute Post Liters of Oxygen 0 L           Oxygen Initial Assessment:  Oxygen Initial Assessment - 08/07/19 1409      Home Oxygen   Home Oxygen Device None    Sleep Oxygen Prescription None    Home  Exercise Oxygen Prescription None    Home at Rest Exercise Oxygen Prescription None    Compliance with Home Oxygen Use Yes      Initial 6 min Walk   Oxygen Used None      Program Oxygen Prescription   Program Oxygen Prescription None      Intervention   Short Term Goals To learn and exhibit compliance with exercise, home and travel O2 prescription;To learn and understand importance of monitoring SPO2 with pulse oximeter and demonstrate accurate use of the pulse oximeter.;To learn and understand importance of maintaining oxygen saturations>88%;To learn and demonstrate proper pursed lip breathing techniques or other breathing techniques.;To learn and demonstrate proper use of respiratory medications    Long  Term Goals Exhibits compliance with exercise, home and travel O2 prescription;Verbalizes importance of monitoring SPO2 with pulse oximeter and return demonstration;Maintenance of O2 saturations>88%;Exhibits proper breathing techniques, such as pursed lip breathing or other method taught during program  session;Compliance with respiratory medication;Demonstrates proper use of MDI's           Oxygen Re-Evaluation:  Oxygen Re-Evaluation    Row Name 08/15/19 1121 09/03/19 1156 09/24/19 1150         Program Oxygen Prescription   Program Oxygen Prescription None None None       Home Oxygen   Home Oxygen Device None None None     Sleep Oxygen Prescription None None None     Home Exercise Oxygen Prescription None None None     Home at Rest Exercise Oxygen Prescription None None None     Compliance with Home Oxygen Use Yes Yes Yes       Goals/Expected Outcomes   Short Term Goals To learn and exhibit compliance with exercise, home and travel O2 prescription;To learn and understand importance of monitoring SPO2 with pulse oximeter and demonstrate accurate use of the pulse oximeter.;To learn and understand importance of maintaining oxygen saturations>88%;To learn and demonstrate proper  pursed lip breathing techniques or other breathing techniques.;To learn and demonstrate proper use of respiratory medications To learn and exhibit compliance with exercise, home and travel O2 prescription;To learn and understand importance of monitoring SPO2 with pulse oximeter and demonstrate accurate use of the pulse oximeter.;To learn and understand importance of maintaining oxygen saturations>88%;To learn and demonstrate proper pursed lip breathing techniques or other breathing techniques.;To learn and demonstrate proper use of respiratory medications To learn and exhibit compliance with exercise, home and travel O2 prescription;To learn and understand importance of monitoring SPO2 with pulse oximeter and demonstrate accurate use of the pulse oximeter.;To learn and understand importance of maintaining oxygen saturations>88%;To learn and demonstrate proper pursed lip breathing techniques or other breathing techniques.;To learn and demonstrate proper use of respiratory medications     Long  Term Goals Exhibits compliance with exercise, home and travel O2 prescription;Verbalizes importance of monitoring SPO2 with pulse oximeter and return demonstration;Maintenance of O2 saturations>88%;Exhibits proper breathing techniques, such as pursed lip breathing or other method taught during program session;Compliance with respiratory medication;Demonstrates proper use of MDI's Exhibits compliance with exercise, home and travel O2 prescription;Verbalizes importance of monitoring SPO2 with pulse oximeter and return demonstration;Maintenance of O2 saturations>88%;Exhibits proper breathing techniques, such as pursed lip breathing or other method taught during program session;Compliance with respiratory medication;Demonstrates proper use of MDI's Exhibits compliance with exercise, home and travel O2 prescription;Verbalizes importance of monitoring SPO2 with pulse oximeter and return demonstration;Maintenance of O2  saturations>88%;Exhibits proper breathing techniques, such as pursed lip breathing or other method taught during program session;Compliance with respiratory medication;Demonstrates proper use of MDI's     Comments Reviewed PLB technique with pt.  Talked about how it works and it's importance in maintaining their exercise saturations. Chinyere is using her PLB and finds it helpful. She does not have a pulse oximeter so we talked about getting one.  She will look into it. Naliah plans to add a day of exercise in addition to LW class.  Staff reviewed importance of checking O2 during exercise.     Goals/Expected Outcomes Short: Become more profiecient at using PLB.   Long: Become independent at using PLB. Short: Get pulse oximeter.  Long: Continue to work on PLB. Short: monitor O2 when exercising at home Long: use PLB as needed            Oxygen Discharge (Final Oxygen Re-Evaluation):  Oxygen Re-Evaluation - 09/24/19 1150      Program Oxygen Prescription   Program Oxygen Prescription None  Home Oxygen   Home Oxygen Device None    Sleep Oxygen Prescription None    Home Exercise Oxygen Prescription None    Home at Rest Exercise Oxygen Prescription None    Compliance with Home Oxygen Use Yes      Goals/Expected Outcomes   Short Term Goals To learn and exhibit compliance with exercise, home and travel O2 prescription;To learn and understand importance of monitoring SPO2 with pulse oximeter and demonstrate accurate use of the pulse oximeter.;To learn and understand importance of maintaining oxygen saturations>88%;To learn and demonstrate proper pursed lip breathing techniques or other breathing techniques.;To learn and demonstrate proper use of respiratory medications    Long  Term Goals Exhibits compliance with exercise, home and travel O2 prescription;Verbalizes importance of monitoring SPO2 with pulse oximeter and return demonstration;Maintenance of O2 saturations>88%;Exhibits proper breathing  techniques, such as pursed lip breathing or other method taught during program session;Compliance with respiratory medication;Demonstrates proper use of MDI's    Comments Joud plans to add a day of exercise in addition to LW class.  Staff reviewed importance of checking O2 during exercise.    Goals/Expected Outcomes Short: monitor O2 when exercising at home Long: use PLB as needed           Initial Exercise Prescription:  Initial Exercise Prescription - 08/13/19 1500      Date of Initial Exercise RX and Referring Provider   Date 08/13/19    Referring Provider [name] DO      Treadmill   MPH 1.7    Grade 0.5    Minutes 15    METs 2.42      NuStep   Level 1    SPM 80    Minutes 15    METs 2      REL-XR   Level 1    Speed 50    Minutes 15    METs 2      Prescription Details   Frequency (times per week) 2    Duration Progress to 30 minutes of continuous aerobic without signs/symptoms of physical distress      Intensity   THRR 40-80% of Max Heartrate 100-131    Ratings of Perceived Exertion 11-13    Perceived Dyspnea 0-4      Progression   Progression Continue to progress workloads to maintain intensity without signs/symptoms of physical distress.      Resistance Training   Training Prescription Yes    Weight 3 lb    Reps 10-15           Perform Capillary Blood Glucose checks as needed.  Exercise Prescription Changes:  Exercise Prescription Changes    Row Name 08/13/19 1500 08/27/19 1000 09/10/19 1300 09/24/19 1300 10/08/19 1300     Response to Exercise   Blood Pressure (Admit) 132/74 130/60 146/70 114/66 130/60   Blood Pressure (Exercise) 204/70  rck 168/80 136/70 148/68 142/68 164/72   Blood Pressure (Exit) 126/70 132/62 122/60 110/60 122/54   Heart Rate (Admit) 70 bpm 71 bpm 69 bpm 65 bpm 73 bpm   Heart Rate (Exercise) 108 bpm 108 bpm 97 bpm 87 bpm 103 bpm   Heart Rate (Exit) 78 bpm 80 bpm 80 bpm 79 bpm 77 bpm   Oxygen Saturation (Admit) 98 %  95 % 96 % 96 % 96 %   Oxygen Saturation (Exercise) 94 % 94 % 94 % 94 % 94 %   Oxygen Saturation (Exit) 94 % 97 % 98 % 93 % 96 %  Rating of Perceived Exertion (Exercise) _0 Perceived Dyspnea (Exercise) _1 Symptoms SOB, R knee pain 5/10 none none none none   Comments walk test results second full day of exercise -- -- --   Duration -- Progress to 30 minutes of  aerobic without signs/symptoms of physical distress Continue with 30 min of aerobic exercise without signs/symptoms of physical distress. Continue with 30 min of aerobic exercise without signs/symptoms of physical distress. Continue with 30 min of aerobic exercise without signs/symptoms of physical distress.   Intensity -- THRR unchanged THRR unchanged THRR unchanged THRR unchanged     Progression   Progression -- Continue to progress workloads to maintain intensity without signs/symptoms of physical distress. Continue to progress workloads to maintain intensity without signs/symptoms of physical distress. Continue to progress workloads to maintain intensity without signs/symptoms of physical distress. Continue to progress workloads to maintain intensity without signs/symptoms of physical distress.   Average METs -- 2.5 2.5 2.75 2.5     Resistance Training   Training Prescription -- Yes Yes Yes Yes   Weight -- 3 lb 3 lb 3 lb 3 lb   Reps -- 10-15 10-15 10-15 10-15     Interval Training   Interval Training -- No No No No     Treadmill   MPH -- 1.7 1.9 1.9 1.8   Grade -- 0.5 0.5 0.5 0.5   Minutes -- _2 METs -- 2.42 2.6 2.59 2.59     NuStep   Level -- _3 SPM -- -- 80 -- 80   Minutes -- _4 METs -- 2.5 2.4 2.9 2.4     REL-XR   Level -- 1 -- 1 --   Minutes -- 15 -- 15 --     Home Exercise Plan   Plans to continue exercise at -- -- -- Home (comment)  walking, also considering joining [employer] (comment)  walking, also considering joining [employer] -- -- --  Add 2 additional days to program exercise sessions. Add 2 additional days to program exercise sessions.   Initial Home Exercises Provided -- -- -- 09/24/19 09/24/19          Exercise Comments:   Exercise Goals and Review:  Exercise Goals    Row Name 08/13/19 1505             Exercise Goals   Increase Physical Activity Yes       Intervention Provide advice, education, support and counseling about physical activity/exercise needs.;Develop an individualized exercise prescription for aerobic and resistive training based on initial evaluation findings, risk stratification, comorbidities and participant's personal goals.       Expected Outcomes Short Term: Attend rehab on a regular basis to increase amount of physical activity.;Long Term: Add in home exercise to make exercise part of routine and to increase amount of physical activity.;Long Term: Exercising regularly at least 3-5 days a week.       Increase Strength and Stamina Yes       Intervention Provide advice, education, support and counseling about physical activity/exercise needs.;Develop an individualized exercise prescription for aerobic and resistive training based on initial evaluation findings, risk stratification, comorbidities and participant's personal goals.       Expected Outcomes Short Term: Increase workloads from initial exercise prescription for resistance, speed, and METs.;Short Term: Perform resistance training exercises routinely  during rehab and add in resistance training at home;Long Term: Improve cardiorespiratory fitness, muscular endurance and strength as measured by increased METs and functional capacity (6MWT)       Able to understand and use rate of perceived exertion (RPE) scale Yes       Intervention Provide education and explanation on how to use RPE scale       Expected Outcomes Short Term: Able to use RPE daily in rehab to express subjective intensity level;Long Term:  Able to use RPE to guide intensity  level when exercising independently       Able to understand and use Dyspnea scale Yes       Intervention Provide education and explanation on how to use Dyspnea scale       Expected Outcomes Short Term: Able to use Dyspnea scale daily in rehab to express subjective sense of shortness of breath during exertion;Long Term: Able to use Dyspnea scale to guide intensity level when exercising independently       Knowledge and understanding of Target Heart Rate Range (THRR) Yes       Intervention Provide education and explanation of THRR including how the numbers were predicted and where they are located for reference       Expected Outcomes Short Term: Able to state/look up THRR;Short Term: Able to use daily as guideline for intensity in rehab;Long Term: Able to use THRR to govern intensity when exercising independently       Able to check pulse independently Yes       Intervention Review the importance of being able to check your own pulse for safety during independent exercise;Provide education and demonstration on how to check pulse in carotid and radial arteries.       Expected Outcomes Short Term: Able to explain why pulse checking is important during independent exercise;Long Term: Able to check pulse independently and accurately       Understanding of Exercise Prescription Yes       Intervention Provide education, explanation, and written materials on patient's individual exercise prescription       Expected Outcomes Short Term: Able to explain program exercise prescription;Long Term: Able to explain home exercise prescription to exercise independently              Exercise Goals Re-Evaluation :  Exercise Goals Re-Evaluation    Row Name 08/15/19 1121 08/27/19 1054 09/03/19 1142 09/10/19 1305 09/24/19 1145     Exercise Goal Re-Evaluation   Exercise Goals Review Increase Physical Activity;Able to understand and use rate of perceived exertion (RPE) scale;Knowledge and understanding of Target  Heart Rate Range (THRR);Understanding of Exercise Prescription;Increase Strength and Stamina;Able to understand and use Dyspnea scale;Able to check pulse independently Increase Physical Activity;Understanding of Exercise Prescription;Increase Strength and Stamina Increase Physical Activity;Understanding of Exercise Prescription;Increase Strength and Stamina Increase Physical Activity;Understanding of Exercise Prescription;Increase Strength and Stamina Increase Physical Activity;Understanding of Exercise Prescription;Increase Strength and Stamina   Comments Reviewed RPE and dyspnea scales, THR and program prescription with pt today.  Pt voiced understanding and was given a copy of goals to take home. Fia is off to a good start in rehab.  She has completed her first two full days of exercise She is already up to level 4 on the NuStep.  We will continue to monitor her progress. Milda is doing well in rehab.  She is not a fan on the XR. We will look at moving her around.  She is planning to walk and swim while  out on vacation next week. Alaynna has increased her speed on TM.  Staff will follow up with her when she returns from vacation. Reviewed home exercise with pt today.  Pt plans to walk/staff videos for exercise.  Reviewed THR, pulse, RPE, sign and symptoms, pulse oximetery and when to call 911 or MD.  Also discussed weather considerations and indoor options.  Pt voiced understanding.   Expected Outcomes Short: Use RPE daily to regulate intensity. Long: Follow program prescription in THR. Short:Continue to attend regularly Long: Continue to follow program prescription Short: Walk and swimmon vacation Long; Continue to improve stamina. Short: stay active on vacation Long: increase MET level Short: add one day of exercise in addition to program sessions Long:  increase stamina   Row Name 09/24/19 1335 10/08/19 1340           Exercise Goal Re-Evaluation   Exercise Goals Review Increase Physical  Activity;Understanding of Exercise Prescription;Increase Strength and Stamina Increase Physical Activity;Understanding of Exercise Prescription;Increase Strength and Stamina      Comments Oliviah is doing well in rehab.  She is now up to level 4 onte NuStep.  We will continue to monitor her progress. Vendela works at [employer] and reaches THR range during some of her sessions.  Staff will encourage increasing workloads slightly to maintain THR during session.      Expected Outcomes Short: Start to slowly bring up workloads  Long: Continue to improve stamina. Short:  increase workloads Long: improve MET level             Discharge Exercise Prescription (Final Exercise Prescription Changes):  Exercise Prescription Changes - 10/08/19 1300      Response to Exercise   Blood Pressure (Admit) 130/60    Blood Pressure (Exercise) 164/72    Blood Pressure (Exit) 122/54    Heart Rate (Admit) 73 bpm    Heart Rate (Exercise) 103 bpm    Heart Rate (Exit) 77 bpm    Oxygen Saturation (Admit) 96 %    Oxygen Saturation (Exercise) 94 %    Oxygen Saturation (Exit) 96 %    Rating of Perceived Exertion (Exercise) 12    Perceived Dyspnea (Exercise) 2    Symptoms none    Duration Continue with 30 min of aerobic exercise without signs/symptoms of physical distress.    Intensity THRR unchanged      Progression   Progression Continue to progress workloads to maintain intensity without signs/symptoms of physical distress.    Average METs 2.5      Resistance Training   Training Prescription Yes    Weight 3 lb    Reps 10-15      Interval Training   Interval Training No      Treadmill   MPH 1.8    Grade 0.5    Minutes 15    METs 2.59      NuStep   Level 3    SPM 80    Minutes 15    METs 2.4      Home Exercise Plan   Plans to continue exercise at Home (comment)   walking, also considering joining [employer]   Frequency Add 2 additional days to program exercise sessions.    Initial Home Exercises  Provided 09/24/19           Nutrition:  Target Goals: Understanding of nutrition guidelines, daily intake of sodium <1572m, cholesterol <2070m calories 30% from fat and 7% or less from saturated fats, daily to have 5 or  more servings of fruits and vegetables.  Education: Controlling Sodium/Reading Food Labels -Group verbal and written material supporting the discussion of sodium use in heart healthy nutrition. Review and explanation with models, verbal and written materials for utilization of the food label.   Education: General Nutrition Guidelines/Fats and Fiber: -Group instruction provided by verbal, written material, models and posters to present the general guidelines for heart healthy nutrition. Gives an explanation and review of dietary fats and fiber.   Biometrics:  Pre Biometrics - 08/13/19 1505      Pre Biometrics   Height 5' 6.75" (1.695 m)    Weight 225 lb 12.8 oz (102.4 kg)    BMI (Calculated) 35.65    Single Leg Stand 10.94 seconds            Nutrition Therapy Plan and Nutrition Goals:  Nutrition Therapy & Goals - 10/03/19 1130      Nutrition Therapy   Diet Pulmonary MNT, heart healthy eating    Drug/Food Interactions Statins/Certain Fruits    Protein (specify units) 80g    Fiber 25 grams    Whole Grain Foods 3 servings    Saturated Fats 12 max. grams    Fruits and Vegetables 5 servings/day    Sodium 1.5 grams      Personal Nutrition Goals   Nutrition Goal ST: add granola to ice cream, eat sweet potatos as a snack, add protein source to snack or breakfast LT: improve breathing (now 2/10)    Comments B: tries to eat when she gets up. An egg (soft boiled) toast or or english muffin (whole wheat) - butter. Coffee or hot tea (sugar free french vanilla creamer). S: L: tuna (mustard and green olives) with crackers (wheat thins (reduced fat)). Diet green tea. D: salmon, no red meat, some portk, mostly chicken or fish (baked). asparagus, lots of salad with other  nonstarchy vegetables, baked vegetables (olive oil) S: loves to bake, ice cream Drinks: diet green tea and water Discussed heart healthy eaitng, pulmonary MNT, discussed adding one more good source of protein. Pt reports loving sweet potatos and would like to have granola on her ice cream (will find a healthy version)      Intervention Plan   Intervention Prescribe, educate and counsel regarding individualized specific dietary modifications aiming towards targeted core components such as weight, hypertension, lipid management, diabetes, heart failure and other comorbidities.;Nutrition handout(s) given to patient.    Expected Outcomes Short Term Goal: Understand basic principles of dietary content, such as calories, fat, sodium, cholesterol and nutrients.;Short Term Goal: A plan has been developed with personal nutrition goals set during dietitian appointment.;Long Term Goal: Adherence to prescribed nutrition plan.           Nutrition Assessments:  Nutrition Assessments - 08/13/19 1506      MEDFICTS Scores   Pre Score 49           MEDIFICTS Score Key:          ?70 Need to make dietary changes          40-70 Heart Healthy Diet         ? 40 Therapeutic Level Cholesterol Diet  Nutrition Goals Re-Evaluation:  Nutrition Goals Re-Evaluation    [place] Name 09/03/19 1157             Goals   Nutrition Goal Meet with dietician       Comment Maurianna has already to started to make some healthier changes in her diet and seeing the  difference in her weight and how she is feeling.       Expected Outcome Short: Meet with dietcian Long: Continue to work on healthy changes.              Nutrition Goals Discharge (Final Nutrition Goals Re-Evaluation):  Nutrition Goals Re-Evaluation - 09/03/19 1157      Goals   Nutrition Goal Meet with dietician    Comment Flornce has already to started to make some healthier changes in her diet and seeing the difference in her weight and how she is feeling.     Expected Outcome Short: Meet with dietcian Long: Continue to work on healthy changes.           Psychosocial: Target Goals: Acknowledge presence or absence of significant depression and/or stress, maximize coping skills, provide positive support system. Participant is able to verbalize types and ability to use techniques and skills needed for reducing stress and depression.   Education: Depression - Provides group verbal and written instruction on the correlation between heart/lung disease and depressed mood, treatment options, and the stigmas associated with seeking treatment.   Education: Sleep Hygiene -Provides group verbal and written instruction about how sleep can affect your health.  Define sleep hygiene, discuss sleep cycles and impact of sleep habits. Review good sleep hygiene tips.    Pulmonary Rehab from 09/26/2019 in [facility] Cardiac and Pulmonary Rehab  Date 09/05/19  Educator [facility]  Instruction Review Code 1- Verbalizes Understanding      Education: Stress and Anxiety: - Provides group verbal and written instruction about the health risks of elevated stress and causes of high stress.  Discuss the correlation between heart/lung disease and anxiety and treatment options. Review healthy ways to manage with stress and anxiety.   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      Family Dynamics   Good Support System? Yes    Comments She can look to her son for support. Patient is not able to sleep well.      Barriers   Psychosocial barriers to participate in program The patient should benefit from training in stress management and relaxation.       Screening Interventions   Interventions Encouraged to exercise;To provide support and resources with identified psychosocial needs;Provide feedback about the scores to participant    Expected Outcomes Short Term goal: Utilizing psychosocial counselor, staff and physician to assist with identification of specific Stressors or current issues interfering with healing process. Setting desired goal for each stressor or current issue identified.;Long Term Goal: Stressors or current issues are controlled or eliminated.;Short Term goal: Identification and review with participant of any Quality of Life or Depression concerns found by scoring the questionnaire.;Long Term goal: The participant improves quality of Life and PHQ9 Scores as seen by post scores and/or verbalization of changes           Quality of Life Scores:  Scores of 19 and below usually indicate a poorer quality of life in these areas.  A difference of  2-3 points is a clinically meaningful difference.  A difference of 2-3 points in the total score of the Quality of Life  Index has been associated with significant improvement in overall quality of life, self-image, physical symptoms, and general health in studies assessing change in quality of life.  PHQ-9: Recent Review Flowsheet Data    Depression screen [facility] 2/9 08/13/2019 12/11/2018 12/05/2017 11/24/2016 11/24/2015   Decreased Interest 0 0 0 1 0   Down, Depressed, Hopeless 0 0 1 0 0   PHQ - 2 Score 0 0 1 1 0   Altered sleeping 1 0 2 0 -   Tired, decreased energy 2 0 1 2 -   Change in appetite 2 0 2 2 -   Feeling bad or failure about yourself  0 0 1 1 -   Trouble concentrating 0 0 0 0 -   Moving slowly or fidgety/restless 0 0 0 0 -   Suicidal thoughts 0 0 0 0 -   PHQ-9 Score 5 0 7 6 -   Difficult doing work/chores Somewhat difficult Not difficult at all Not difficult at all Not difficult at all -     Interpretation of Total Score  Total Score Depression Severity:  1-4 = Minimal  depression, 5-9 = Mild depression, 10-14 = Moderate depression, 15-19 = Moderately severe depression, 20-27 = Severe depression   Psychosocial Evaluation and Intervention:  Psychosocial Evaluation - 08/07/19 1405      Psychosocial Evaluation & Interventions   Interventions Encouraged to exercise with the program and follow exercise prescription    Comments 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 She can look to her son for support. Patient is not able to sleep well.    Expected Outcomes Short: Continue to exercise regularly to support mental health and notify staff of any changes. Long: maintain mental health and well being through teaching of rehab or prescribed medications independently.    Continue Psychosocial Services  Follow up required by staff           Psychosocial Re-Evaluation:  Psychosocial Re-Evaluation    [place] Name 09/03/19 1146 09/24/19 1138           Psychosocial Re-Evaluation   Current issues with Current Stress Concerns;Current Sleep Concerns --      Comments Maronda is doing well in rehab.  She is going to the beach for the week next week to [facility]. She is looking forward to her trip to just get away for a few days.  She continues to have trouble sleeping, so I sent her information to review. Marianita still doesnt sleep well.  She has trouble going back to sleep if she gets up.  Other than sleep, she feels good mentally.      Expected Outcomes Short: Review sleep info and enjoy vacation Long; Continue to stay positive. Short: continue to work on sleeping better Long: maintain positive outlook      Interventions Encouraged to attend Pulmonary Rehabilitation for the exercise;Stress management education --      Continue Psychosocial Services  Follow up required by staff --             Psychosocial Discharge (Final Psychosocial Re-Evaluation):  Psychosocial Re-Evaluation - 09/24/19 1138      Psychosocial Re-Evaluation   Comments Raiven  still doesnt sleep well.  She has trouble going back to sleep if she gets up.  Other than sleep, she feels good mentally.    Expected Outcomes Short: continue to work on sleeping better Long: maintain positive outlook  Education: Education Goals: Education classes will be provided on a weekly basis, covering required topics. Participant will state understanding/return demonstration of topics presented.  Learning Barriers/Preferences:  Learning Barriers/Preferences - 08/07/19 1403      Learning Barriers/Preferences   Learning Barriers None    Learning Preferences None           General Pulmonary Education Topics:  Infection Prevention: - Provides verbal and written material to individual with discussion of infection control including proper hand washing and proper equipment cleaning during exercise session.   Pulmonary Rehab from 09/26/2019 in [facility] Cardiac and Pulmonary Rehab  Date 08/13/19  Educator [facility]  Instruction Review Code 1- Verbalizes Understanding      Falls Prevention: - Provides verbal and written material to individual with discussion of falls prevention and safety.   Pulmonary Rehab from 09/26/2019 in [facility] Cardiac and Pulmonary Rehab  Date 08/13/19  Educator [facility]  Instruction Review Code 1- Verbalizes Understanding      Chronic Lung Diseases: - Group verbal and written instruction to review updates, respiratory medications, advancements in procedures and treatments. Discuss use of supplemental oxygen including available portable oxygen systems, continuous and intermittent flow rates, concentrators, personal use and safety guidelines. Review proper use of inhaler and spacers. Provide informative websites for self-education.    Pulmonary Rehab from 09/26/2019 in [facility] Cardiac and Pulmonary Rehab  Date 09/19/19  Educator jh  Instruction Review Code 1- Verbalizes Understanding      Energy Conservation: - Provide group verbal and written instruction for  methods to conserve energy, plan and organize activities. Instruct on pacing techniques, use of adaptive equipment and posture/positioning to relieve shortness of breath.   Triggers and Exacerbations: - Group verbal and written instruction to review types of environmental triggers and ways to prevent exacerbations. Discuss weather changes, air quality and the benefits of nasal washing. Review warning signs and symptoms to help prevent infections. Discuss techniques for effective airway clearance, coughing, and vibrations.   AED/CPR: - Group verbal and written instruction with the use of models to demonstrate the basic use of the AED with the basic ABC's of resuscitation.   Anatomy and Physiology of the Lungs: - Group verbal and written instruction with the use of models to provide basic lung anatomy and physiology related to function, structure and complications of lung disease.   Anatomy & Physiology of the Heart: - Group verbal and written instruction and models provide basic cardiac anatomy and physiology, with the coronary electrical and arterial systems. Review of Valvular disease and Heart Failure   Cardiac Medications: - Group verbal and written instruction to review commonly prescribed medications for heart disease. Reviews the medication, class of the drug, and side effects.   Pulmonary Rehab from 09/26/2019 in [facility] Cardiac and Pulmonary Rehab  Date 09/26/19  Educator SB  Instruction Review Code 1- Verbalizes Understanding      Other: -Provides group and verbal instruction on various topics (see comments)   Knowledge Questionnaire Score:  Knowledge Questionnaire Score - 08/13/19 1506      Knowledge Questionnaire Score   Pre Score 11/18 Education Focus; Pulm A&P, O2 safety            Core Components/Risk Factors/Patient Goals at Admission:  Personal Goals and Risk Factors at Admission - 08/13/19 1506      Core Components/Risk Factors/Patient Goals on Admission     Weight Management Yes;Weight Loss;Obesity    Intervention Weight Management: Develop a combined nutrition and exercise program designed to reach desired caloric  intake, while maintaining appropriate intake of nutrient and fiber, sodium and fats, and appropriate energy expenditure required for the weight goal.;Weight Management: Provide education and appropriate resources to help participant work on and attain dietary goals.;Weight Management/Obesity: Establish reasonable short term and long term weight goals.;Obesity: Provide education and appropriate resources to help participant work on and attain dietary goals.    Admit Weight 225 lb 12.8 oz (102.4 kg)    Goal Weight: Short Term 220 lb (99.8 kg)    Goal Weight: Long Term 215 lb (97.5 kg)    Expected Outcomes Long Term: Adherence to nutrition and physical activity/exercise program aimed toward attainment of established weight goal;Short Term: Continue to assess and modify interventions until short term weight is achieved;Weight Loss: Understanding of general recommendations for a balanced deficit meal plan, which promotes 1-2 lb weight loss per week and includes a negative energy balance of [phone] kcal/d;Understanding recommendations for meals to include 15-35% energy as protein, 25-35% energy from fat, 35-60% energy from carbohydrates, less than 245m of dietary cholesterol, 20-35 gm of total fiber daily;Understanding of distribution of calorie intake throughout the day with the consumption of 4-5 meals/snacks    Improve shortness of breath with ADL's Yes    Intervention Provide education, individualized exercise plan and daily activity instruction to help decrease symptoms of SOB with activities of daily living.    Expected Outcomes Short Term: Improve cardiorespiratory fitness to achieve a reduction of symptoms when performing ADLs;Long Term: Be able to perform more ADLs without symptoms or delay the onset of symptoms    Lipids Yes    Intervention  Provide education and support for participant on nutrition & aerobic/resistive exercise along with prescribed medications to achieve LDL <758m HDL >4071m   Expected Outcomes Short Term: Participant states understanding of desired cholesterol values and is compliant with medications prescribed. Participant is following exercise prescription and nutrition guidelines.;Long Term: Cholesterol controlled with medications as prescribed, with individualized exercise RX and with personalized nutrition plan. Value goals: LDL < 61m90mDL > 40 mg.           Education:Diabetes - Individual verbal and written instruction to review signs/symptoms of diabetes, desired ranges of glucose level fasting, after meals and with exercise. Acknowledge that pre and post exercise glucose checks will be done for 3 sessions at entry of program.   Education: Know Your Numbers and Risk Factors: -Group verbal and written instruction about important numbers in your health.  Discussion of what are risk factors and how they play a role in the disease process.  Review of Cholesterol, Blood Pressure, Diabetes, and BMI and the role they play in your overall health.   Core Components/Risk Factors/Patient Goals Review:   Goals and Risk Factor Review    Row Name 09/03/19 1144 09/24/19 1135           Core Components/Risk Factors/Patient Goals Review   Personal Goals Review Weight Management/Obesity;Improve shortness of breath with ADL's;Hypertension Weight Management/Obesity;Improve shortness of breath with ADL's;Hypertension      Review SandDenysedoing well in rehab.  Her weight is trending down.  She just doesn't like our scale compared to hers at home. Her blood pressures are good in class and she does not check them at home.  Her doctor wants her to check it.  Her breathing is doing well. SandJacobyt got back from vacation so her weight may be up.  She doesnt monitor BP at home.  She has a time scheduled to meet with  RD about  diet.      Expected Outcomes Short: Start checking blood pressures. Long: Continue to monitor risk factors. Short: neet with RD Long: manage risk factors             Core Components/Risk Factors/Patient Goals at Discharge (Final Review):   Goals and Risk Factor Review - 09/24/19 1135      Core Components/Risk Factors/Patient Goals Review   Personal Goals Review Weight Management/Obesity;Improve shortness of breath with ADL's;Hypertension    Review Kamaria just got back from vacation so her weight may be up.  She doesnt monitor BP at home.  She has a time scheduled to meet with RD about diet.    Expected Outcomes Short: neet with RD Long: manage risk factors           ITP Comments:  ITP Comments    Row Name 08/07/19 1413 08/13/19 1458 08/14/19 0653 08/15/19 1120 09/11/19 1050   ITP Comments Virtual Visit completed. Patient informed on EP and RD appointment and 6 Minute walk test. Patient also informed of patient health questionnaires on My Chart. Patient Verbalizes understanding. Visit diagnosis can be found in [facility] 08/05/2019. Completed 6MWT and gym orientation.  Initial ITP created and sent for review to Dr. Emily Filbert, Medical Director. 30 Day review completed. Medical Director ITP review done, changes made as directed, and signed approval by Medical Director. First full day of exercise!  Patient was oriented to gym and equipment including functions, settings, policies, and procedures.  Patient's individual exercise prescription and treatment plan were reviewed.  All starting workloads were established based on the results of the 6 minute walk test done at initial orientation visit.  The plan for exercise progression was also introduced and progression will be customized based on patient's performance and goals. 30 Day review completed. Medical Director ITP review done, changes made as directed, and signed approval by Medical Director.   [place] Name 10/09/19 0629           ITP Comments 30 Day  review completed. Medical Director ITP review done, changes made as directed, and signed approval by Medical Director.              Comments:

## 2019-10-10 ENCOUNTER — Encounter: Payer: Medicare HMO | Admitting: *Deleted

## 2019-10-10 ENCOUNTER — Other Ambulatory Visit: Payer: Self-pay

## 2019-10-10 DIAGNOSIS — J449 Chronic obstructive pulmonary disease, unspecified: Secondary | ICD-10-CM

## 2019-10-10 NOTE — Progress Notes (Signed)
Daily Session Note  Patient Details  Name: Hannah Green MRN: [id number] Date of Birth: 1946-02-07 Referring Provider:     Pulmonary Rehab from 08/13/2019 in [facility] Cardiac and Pulmonary Rehab  Referring Provider [name] DO      Encounter Date: 10/10/2019  Check In:  Session Check In - 10/10/19 1104      Check-In   Supervising physician immediately available to respond to emergencies See telemetry face sheet for immediately available ER MD    Location [facility]-Cardiac & Pulmonary Rehab    Staff Present Renita Papa, RN BSN;Jessica Luan Pulling, MA, RCEP, CCRP, Marylynn Pearson, MS Exercise Physiologist;Melissa Caiola RDN, LDN;Susanne Bice, RN, BSN, CCRP    Virtual Visit No    Medication changes reported     No    Fall or balance concerns reported    No    Warm-up and Cool-down Performed on first and last piece of equipment    Resistance Training Performed Yes    VAD Patient? No    PAD/SET Patient? No      Pain Assessment   Currently in Pain? No/denies              Social History   Tobacco Use  Smoking Status Former Smoker  . Packs/day: 1.50  . Years: 50.00  . Pack years: 75.00  . Types: Cigarettes  . Quit date: 04/01/2011  . Years since quitting: 8.5  Smokeless Tobacco Never Used  Tobacco Comment   has stopped but has started again in "crisis"    Goals Met:  Independence with exercise equipment Exercise tolerated well No report of cardiac concerns or symptoms Strength training completed today  Goals Unmet:  Not Applicable  Comments: Pt able to follow exercise prescription today without complaint.  Will continue to monitor for progression.    Dr. Emily Filbert is Medical Director for [place] and [facility].

## 2019-10-14 DIAGNOSIS — L570 Actinic keratosis: Secondary | ICD-10-CM | POA: Diagnosis not present

## 2019-10-14 DIAGNOSIS — L820 Inflamed seborrheic keratosis: Secondary | ICD-10-CM | POA: Diagnosis not present

## 2019-10-15 ENCOUNTER — Other Ambulatory Visit: Payer: Self-pay

## 2019-10-15 ENCOUNTER — Encounter: Payer: Medicare HMO | Admitting: *Deleted

## 2019-10-15 DIAGNOSIS — J449 Chronic obstructive pulmonary disease, unspecified: Secondary | ICD-10-CM | POA: Diagnosis not present

## 2019-10-15 NOTE — Progress Notes (Signed)
Daily Session Note  Patient Details  Name: Hannah Green MRN: [id number] Date of Birth: [birth year] Referring Provider:     Pulmonary Rehab from 08/13/2019 in [facility] Cardiac and Pulmonary Rehab  Referring Provider [name] DO      Encounter Date: 10/15/2019  Check In:  Session Check In - 10/15/19 1151      Check-In   Supervising physician immediately available to respond to emergencies See telemetry face sheet for immediately available ER MD    Location [facility]-Cardiac & Pulmonary Rehab    Staff Present Heath Lark, RN, BSN, CCRP;Joseph Hood RCP,RRT,BSRT;Melissa Baltic RDN, Rowe Pavy, [place], ACSM CEP, Exercise Physiologist    Virtual Visit No    Medication changes reported     No    Fall or balance concerns reported    No    Warm-up and Cool-down Performed on first and last piece of equipment    Resistance Training Performed Yes    VAD Patient? No    PAD/SET Patient? No      Pain Assessment   Currently in Pain? No/denies              Social History   Tobacco Use  Smoking Status Former Smoker  . Packs/day: 1.50  . Years: 50.00  . Pack years: 75.00  . Types: Cigarettes  . Quit date: 04/01/2011  . Years since quitting: 8.5  Smokeless Tobacco Never Used  Tobacco Comment   has stopped but has started again in "crisis"    Goals Met:  Proper associated with RPD/PD & O2 Sat Independence with exercise equipment Exercise tolerated well No report of cardiac concerns or symptoms  Goals Unmet:  Not Applicable  Comments: Pt able to follow exercise prescription today without complaint.  Will continue to monitor for progression.    Dr. Emily Filbert is Medical Director for [place] and [facility].

## 2019-10-22 ENCOUNTER — Encounter: Payer: Medicare HMO | Admitting: *Deleted

## 2019-10-22 ENCOUNTER — Other Ambulatory Visit: Payer: Self-pay

## 2019-10-22 DIAGNOSIS — J449 Chronic obstructive pulmonary disease, unspecified: Secondary | ICD-10-CM | POA: Diagnosis not present

## 2019-10-22 NOTE — Progress Notes (Signed)
Daily Session Note  Patient Details  Name: Hannah Green MRN: [id number] Date of Birth: 02-03-1946 Referring Provider:     Pulmonary Rehab from 08/13/2019 in [facility]  Referring Provider [name] DO      Encounter Date: 10/22/2019  Check In:  Session Check In - 10/22/19 1146      Check-In   Supervising physician immediately available to respond to emergencies See telemetry face sheet for immediately available ER MD    Location [facility]-Cardiac & Pulmonary Rehab    Staff Present Heath Lark, RN, BSN, Jacklynn Bue, MS Exercise Physiologist;Amanda Oletta Darter, [place], ACSM CEP, Exercise Physiologist;Melissa Caiola RDN, LDN    Virtual Visit No    Medication changes reported     No    Fall or balance concerns reported    No    Warm-up and Cool-down Performed on first and last piece of equipment    Resistance Training Performed Yes    VAD Patient? No    PAD/SET Patient? No      Pain Assessment   Currently in Pain? No/denies              Social History   Tobacco Use  Smoking Status Former Smoker  . Packs/day: 1.50  . Years: 50.00  . Pack years: 75.00  . Types: Cigarettes  . Quit date: 04/01/2011  . Years since quitting: 8.5  Smokeless Tobacco Never Used  Tobacco Comment   has stopped but has started again in "crisis"    Goals Met:  Proper associated with RPD/PD & O2 Sat Independence with exercise equipment Exercise tolerated well No report of cardiac concerns or symptoms  Goals Unmet:  Not Applicable  Comments: Pt able to follow exercise prescription today without complaint.  Will continue to monitor for progression.    Dr. Emily Filbert is Medical Director for [place] and [facility].

## 2019-11-05 ENCOUNTER — Ambulatory Visit: Payer: Medicare HMO | Admitting: Cardiology

## 2019-11-05 ENCOUNTER — Encounter: Payer: Medicare HMO | Attending: Pulmonary Disease | Admitting: *Deleted

## 2019-11-05 ENCOUNTER — Other Ambulatory Visit: Payer: Self-pay

## 2019-11-05 DIAGNOSIS — J449 Chronic obstructive pulmonary disease, unspecified: Secondary | ICD-10-CM | POA: Insufficient documentation

## 2019-11-05 NOTE — Progress Notes (Signed)
Daily Session Note  Patient Details  Name: Hannah Green MRN: [id number] Date of Birth: Jul 10, 1945 Referring Provider:     Pulmonary Rehab from 08/13/2019 in [facility] Card[facility] Pul[facility] Re[facility]  Referring Provider [name] DO      Encounter Date: 11/05/2019  Check In:  Session Check In - 11/05/19 1117      Check-In   Supervising physician immediately available to respond to emergencies See telemetry face sheet for immediately available ER MD    Staff Present Justin Mend RCP,RRT,BSRT;Naela Nodal Frederico Hamman, RN Vickki Hearing, BA, ACSM CEP, Exercise Physiologist;Melissa Caiola RDN, LDN    Virtual Visit No    Medication changes reported     No    Fall or balance concerns reported    No    Tobacco Cessation No Change    Warm-up and Cool-down Performed on first and last piece of equipment    Resistance Training Performed Yes    VAD Patient? No    PAD/SET Patient? No      Pain Assessment   Currently in Pain? No/denies              Social History   Tobacco Use  Smoking Status Former Smoker  . Packs/day: 1.50  . Years: 50.00  . Pack years: 75.00  . Types: Cigarettes  . Quit date: 04/01/2011  . Years since quitting: 8.6  Smokeless Tobacco Never Used  Tobacco Comment   has stopped but has started again in "crisis"    Goals Met:  Proper associated with RPD/PD & O2 Sat Independence with exercise equipment Using PLB without cueing & demonstrates good technique Exercise tolerated well Strength training completed today  Goals Unmet:  Not Applicable  Comments: Pt able to follow exercise prescription today without complaint.  Will continue to monitor for progression.    Dr. Emily Filbert is Medical Director for [place] [facility].

## 2019-11-06 ENCOUNTER — Encounter: Payer: Self-pay | Admitting: *Deleted

## 2019-11-06 DIAGNOSIS — J449 Chronic obstructive pulmonary disease, unspecified: Secondary | ICD-10-CM

## 2019-11-06 NOTE — Progress Notes (Signed)
Pulmonary Individual Treatment Plan  Patient Details  Name: TALYIA ALLENDE MRN: [id number] Date of Birth: [birth year] Referring Provider:     Pulmonary Rehab from 08/13/2019 in [facility] Cardiac and Pulmonary Rehab  Referring Provider [name] DO      Initial Encounter Date:    Pulmonary Rehab from 08/13/2019 in [facility] Cardiac and Pulmonary Rehab  Date 08/13/19      Visit Diagnosis: Stage 3 severe COPD by GOLD classification ([place])  Patient's Home Medications on Admission:  Current Outpatient Medications:  .  albuterol (VENTOLIN HFA) 108 (90 Base) MCG/ACT inhaler, TAKE 2 PUFFS BY MOUTH EVERY 6 HOURS AS NEEDED FOR WHEEZE OR SHORTNESS OF BREATH, Disp: 8.5 g, Rfl: 3 .  aspirin EC 81 MG tablet, Take 1 tablet (81 mg total) by mouth daily., Disp: 90 tablet, Rfl: 3 .  atorvastatin (LIPITOR) 10 MG tablet, Take 1 tablet (10 mg total) by mouth daily., Disp: 90 tablet, Rfl: 3 .  cholecalciferol (VITAMIN D3) 25 MCG (1000 UT) tablet, Take 1,000 Units by mouth daily., Disp: , Rfl:  .  Fluticasone-Umeclidin-Vilant (TRELEGY ELLIPTA) 100-62.5-25 MCG/INH AEPB, Inhale 1 puff into the lungs daily., Disp: 60 each, Rfl: 5  Past Medical History: Past Medical History:  Diagnosis Date  . Breast cancer ([place]) 1999  . Cataract 2017   resolved with surgery  . COPD (chronic obstructive pulmonary disease) ([place])   . Coronary artery disease   . GERD (gastroesophageal reflux disease)   . Hyperlipidemia   . Postoperative atrial fibrillation ([place]) 03/2018  . Rheumatoid arthritis(714.0)   . Squamous cell carcinoma of lung, stage I, right ([place]) 2020   Status post right upper and middle lobectomy    Tobacco Use: Social History   Tobacco Use  Smoking Status Former Smoker  . Packs/day: 1.50  . Years: 50.00  . Pack years: 75.00  . Types: Cigarettes  . Quit date: 04/01/2011  . Years since quitting: 8.6  Smokeless Tobacco Never Used  Tobacco Comment   has stopped but has started again in "crisis"     Labs: Recent Review Flowsheet Data    Labs for ITP Cardiac and Pulmonary Rehab Latest Ref Rng & Units 04/10/2018 04/19/2018 04/23/2018 04/24/2018 12/11/2018   Cholestrol 0 - 200 mg/dL - - - - 218(H)   LDLCALC 0 - 99 mg/dL - - - - 127(H)   LDLDIRECT mg/dL - - - - -   HDL >39.00 mg/dL - - - - 57.90   Trlycerides 0 - 149 mg/dL - - - - 165.0(H)   Hemoglobin A1c 4.6 - 6.5 % - - - - 5.9   PHART 7.35 - 7.45 - TEST WILL BE CREDITED 7.425 7.354 -   PCO2ART 32 - 48 mmHg - TEST WILL BE CREDITED 41.1 48.4(H) -   HCO3 20.0 - 28.0 mmol/L 29.0(H) TEST WILL BE CREDITED 26.5 26.3 -   TCO2 22 - 32 mmol/L 31 - - - -   ACIDBASEDEF 0.0 - 2.0 mmol/L - TEST WILL BE CREDITED - - -   O2SAT % 76.0 TEST WILL BE CREDITED 97.7 98.6 -       Pulmonary Assessment Scores:  Pulmonary Assessment Scores    Row Name 08/13/19 1507         ADL UCSD   ADL Phase Entry     SOB Score total 57     Rest 1     Walk 3     Stairs 4     Bath 0  Dress 0     Shop 4       CAT Score   CAT Score 21       mMRC Score   mMRC Score 3            UCSD: Self-administered rating of dyspnea associated with activities of daily living (ADLs) 6-point scale (0 = "not at all" to 5 = "maximal or unable to do because of breathlessness")  Scoring Scores range from 0 to 120.  Minimally important difference is 5 units  CAT: CAT can identify the health impairment of COPD patients and is better correlated with disease progression.  CAT has a scoring range of zero to 40. The CAT score is classified into four groups of low (less than 10), medium (10 - 20), high (21-30) and very high (31-40) based on the impact level of disease on health status. A CAT score over 10 suggests significant symptoms.  A worsening CAT score could be explained by an exacerbation, poor medication adherence, poor inhaler technique, or progression of COPD or comorbid conditions.  CAT MCID is 2 points  mMRC: mMRC (Modified Medical Research Council) Dyspnea  Scale is used to assess the degree of baseline functional disability in patients of respiratory disease due to dyspnea. No minimal important difference is established. A decrease in score of 1 point or greater is considered a positive change.   Pulmonary Function Assessment:  Pulmonary Function Assessment - 08/07/19 1410      Breath   Shortness of Breath Yes;Limiting activity           Exercise Target Goals: Exercise Program Goal: Individual exercise prescription set using results from initial 6 min walk test and THRR while considering  patient's activity barriers and safety.   Exercise Prescription Goal: Initial exercise prescription builds to 30-45 minutes a day of aerobic activity, 2-3 days per week.  Home exercise guidelines will be given to patient during program as part of exercise prescription that the participant will acknowledge.  Education: Aerobic Exercise & Resistance Training: - Gives group verbal and written instruction on the various components of exercise. Focuses on aerobic and resistive training programs and the benefits of this training and how to safely progress through these programs..   Education: Exercise & Equipment Safety: - Individual verbal instruction and demonstration of equipment use and safety with use of the equipment.   Pulmonary Rehab from 10/10/2019 in [facility] Cardiac and Pulmonary Rehab  Date 08/13/19  Educator [facility]  Instruction Review Code 1- Verbalizes Understanding      Education: Exercise Physiology & General Exercise Guidelines: - Group verbal and written instruction with models to review the exercise physiology of the cardiovascular system and associated critical values. Provides general exercise guidelines with specific guidelines to those with heart or lung disease.    Education: Flexibility, Balance, Mind/Body Relaxation: Provides group verbal/written instruction on the benefits of flexibility and balance training, including mind/body  exercise modes such as yoga, pilates and tai chi.  Demonstration and skill practice provided.   Activity Barriers & Risk Stratification:  Activity Barriers & Cardiac Risk Stratification - 08/13/19 1501      Activity Barriers & Cardiac Risk Stratification   Activity Barriers Deconditioning;Joint Problems;Muscular Weakness;Shortness of Breath;Balance Concerns   r knee pain          6 Minute Walk:  6 Minute Walk    Row Name 08/13/19 1459         6 Minute Walk   Phase Initial  Distance 1035 feet     Walk Time 5 minutes     # of Rest Breaks 1  1 min     MPH 2.35     METS 2.32     RPE 13     Perceived Dyspnea  3     VO2 Peak 8.11     Symptoms Yes (comment)     Comments SOB, knee pain (5/10)     Resting HR 70 bpm     Resting BP 132/74     Resting Oxygen Saturation  98 %     Exercise Oxygen Saturation  during 6 min walk 94 %     Max Ex. HR 108 bpm     Max Ex. BP 204/70     2 Minute Post BP 160/80       Interval HR   1 Minute HR 94     2 Minute HR 108     3 Minute HR 102     4 Minute HR 102  seated     5 Minute HR 79     6 Minute HR 100     2 Minute Post HR 71     Interval Heart Rate? Yes       Interval Oxygen   Interval Oxygen? Yes     Baseline Oxygen Saturation % 98 %     1 Minute Oxygen Saturation % 97 %     1 Minute Liters of Oxygen 0 L  Room Air     2 Minute Oxygen Saturation % 94 %     2 Minute Liters of Oxygen 0 L     3 Minute Oxygen Saturation % 96 %     3 Minute Liters of Oxygen 0 L     4 Minute Oxygen Saturation % 95 %     4 Minute Liters of Oxygen 0 L     5 Minute Oxygen Saturation % 97 %     5 Minute Liters of Oxygen 0 L     6 Minute Oxygen Saturation % 96 %     6 Minute Liters of Oxygen 0 L     2 Minute Post Oxygen Saturation % 98 %     2 Minute Post Liters of Oxygen 0 L           Oxygen Initial Assessment:  Oxygen Initial Assessment - 08/07/19 1409      Home Oxygen   Home Oxygen Device None    Sleep Oxygen Prescription None    Home  Exercise Oxygen Prescription None    Home at Rest Exercise Oxygen Prescription None    Compliance with Home Oxygen Use Yes      Initial 6 min Walk   Oxygen Used None      Program Oxygen Prescription   Program Oxygen Prescription None      Intervention   Short Term Goals To learn and exhibit compliance with exercise, home and travel O2 prescription;To learn and understand importance of monitoring SPO2 with pulse oximeter and demonstrate accurate use of the pulse oximeter.;To learn and understand importance of maintaining oxygen saturations>88%;To learn and demonstrate proper pursed lip breathing techniques or other breathing techniques.;To learn and demonstrate proper use of respiratory medications    Long  Term Goals Exhibits compliance with exercise, home and travel O2 prescription;Verbalizes importance of monitoring SPO2 with pulse oximeter and return demonstration;Maintenance of O2 saturations>88%;Exhibits proper breathing techniques, such as pursed lip breathing or other method taught during program  session;Compliance with respiratory medication;Demonstrates proper use of MDI's           Oxygen Re-Evaluation:  Oxygen Re-Evaluation    Row Name 08/15/19 1121 09/03/19 1156 09/24/19 1150         Program Oxygen Prescription   Program Oxygen Prescription None None None       Home Oxygen   Home Oxygen Device None None None     Sleep Oxygen Prescription None None None     Home Exercise Oxygen Prescription None None None     Home at Rest Exercise Oxygen Prescription None None None     Compliance with Home Oxygen Use Yes Yes Yes       Goals/Expected Outcomes   Short Term Goals To learn and exhibit compliance with exercise, home and travel O2 prescription;To learn and understand importance of monitoring SPO2 with pulse oximeter and demonstrate accurate use of the pulse oximeter.;To learn and understand importance of maintaining oxygen saturations>88%;To learn and demonstrate proper  pursed lip breathing techniques or other breathing techniques.;To learn and demonstrate proper use of respiratory medications To learn and exhibit compliance with exercise, home and travel O2 prescription;To learn and understand importance of monitoring SPO2 with pulse oximeter and demonstrate accurate use of the pulse oximeter.;To learn and understand importance of maintaining oxygen saturations>88%;To learn and demonstrate proper pursed lip breathing techniques or other breathing techniques.;To learn and demonstrate proper use of respiratory medications To learn and exhibit compliance with exercise, home and travel O2 prescription;To learn and understand importance of monitoring SPO2 with pulse oximeter and demonstrate accurate use of the pulse oximeter.;To learn and understand importance of maintaining oxygen saturations>88%;To learn and demonstrate proper pursed lip breathing techniques or other breathing techniques.;To learn and demonstrate proper use of respiratory medications     Long  Term Goals Exhibits compliance with exercise, home and travel O2 prescription;Verbalizes importance of monitoring SPO2 with pulse oximeter and return demonstration;Maintenance of O2 saturations>88%;Exhibits proper breathing techniques, such as pursed lip breathing or other method taught during program session;Compliance with respiratory medication;Demonstrates proper use of MDI's Exhibits compliance with exercise, home and travel O2 prescription;Verbalizes importance of monitoring SPO2 with pulse oximeter and return demonstration;Maintenance of O2 saturations>88%;Exhibits proper breathing techniques, such as pursed lip breathing or other method taught during program session;Compliance with respiratory medication;Demonstrates proper use of MDI's Exhibits compliance with exercise, home and travel O2 prescription;Verbalizes importance of monitoring SPO2 with pulse oximeter and return demonstration;Maintenance of O2  saturations>88%;Exhibits proper breathing techniques, such as pursed lip breathing or other method taught during program session;Compliance with respiratory medication;Demonstrates proper use of MDI's     Comments Reviewed PLB technique with pt.  Talked about how it works and it's importance in maintaining their exercise saturations. Chinyere is using her PLB and finds it helpful. She does not have a pulse oximeter so we talked about getting one.  She will look into it. Elli plans to add a day of exercise in addition to LW class.  Staff reviewed importance of checking O2 during exercise.     Goals/Expected Outcomes Short: Become more profiecient at using PLB.   Long: Become independent at using PLB. Short: Get pulse oximeter.  Long: Continue to work on PLB. Short: monitor O2 when exercising at home Long: use PLB as needed            Oxygen Discharge (Final Oxygen Re-Evaluation):  Oxygen Re-Evaluation - 09/24/19 1150      Program Oxygen Prescription   Program Oxygen Prescription None  Home Oxygen   Home Oxygen Device None    Sleep Oxygen Prescription None    Home Exercise Oxygen Prescription None    Home at Rest Exercise Oxygen Prescription None    Compliance with Home Oxygen Use Yes      Goals/Expected Outcomes   Short Term Goals To learn and exhibit compliance with exercise, home and travel O2 prescription;To learn and understand importance of monitoring SPO2 with pulse oximeter and demonstrate accurate use of the pulse oximeter.;To learn and understand importance of maintaining oxygen saturations>88%;To learn and demonstrate proper pursed lip breathing techniques or other breathing techniques.;To learn and demonstrate proper use of respiratory medications    Long  Term Goals Exhibits compliance with exercise, home and travel O2 prescription;Verbalizes importance of monitoring SPO2 with pulse oximeter and return demonstration;Maintenance of O2 saturations>88%;Exhibits proper breathing  techniques, such as pursed lip breathing or other method taught during program session;Compliance with respiratory medication;Demonstrates proper use of MDI's    Comments Joud plans to add a day of exercise in addition to LW class.  Staff reviewed importance of checking O2 during exercise.    Goals/Expected Outcomes Short: monitor O2 when exercising at home Long: use PLB as needed           Initial Exercise Prescription:  Initial Exercise Prescription - 08/13/19 1500      Date of Initial Exercise RX and Referring Provider   Date 08/13/19    Referring Provider [name] DO      Treadmill   MPH 1.7    Grade 0.5    Minutes 15    METs 2.42      NuStep   Level 1    SPM 80    Minutes 15    METs 2      REL-XR   Level 1    Speed 50    Minutes 15    METs 2      Prescription Details   Frequency (times per week) 2    Duration Progress to 30 minutes of continuous aerobic without signs/symptoms of physical distress      Intensity   THRR 40-80% of Max Heartrate 100-131    Ratings of Perceived Exertion 11-13    Perceived Dyspnea 0-4      Progression   Progression Continue to progress workloads to maintain intensity without signs/symptoms of physical distress.      Resistance Training   Training Prescription Yes    Weight 3 lb    Reps 10-15           Perform Capillary Blood Glucose checks as needed.  Exercise Prescription Changes:  Exercise Prescription Changes    Row Name 08/13/19 1500 08/27/19 1000 09/10/19 1300 09/24/19 1300 10/08/19 1300     Response to Exercise   Blood Pressure (Admit) 132/74 130/60 146/70 114/66 130/60   Blood Pressure (Exercise) 204/70  rck 168/80 136/70 148/68 142/68 164/72   Blood Pressure (Exit) 126/70 132/62 122/60 110/60 122/54   Heart Rate (Admit) 70 bpm 71 bpm 69 bpm 65 bpm 73 bpm   Heart Rate (Exercise) 108 bpm 108 bpm 97 bpm 87 bpm 103 bpm   Heart Rate (Exit) 78 bpm 80 bpm 80 bpm 79 bpm 77 bpm   Oxygen Saturation (Admit) 98 %  95 % 96 % 96 % 96 %   Oxygen Saturation (Exercise) 94 % 94 % 94 % 94 % 94 %   Oxygen Saturation (Exit) 94 % 97 % 98 % 93 % 96 %  Rating of Perceived Exertion (Exercise) 13 11 13 12 12    Perceived Dyspnea (Exercise) 3 3 2 2 2    Symptoms SOB, R knee pain 5/10 none none none none   Comments walk test results second full day of exercise -- -- --   Duration -- Progress to 30 minutes of  aerobic without signs/symptoms of physical distress Continue with 30 min of aerobic exercise without signs/symptoms of physical distress. Continue with 30 min of aerobic exercise without signs/symptoms of physical distress. Continue with 30 min of aerobic exercise without signs/symptoms of physical distress.   Intensity -- THRR unchanged THRR unchanged THRR unchanged THRR unchanged     Progression   Progression -- Continue to progress workloads to maintain intensity without signs/symptoms of physical distress. Continue to progress workloads to maintain intensity without signs/symptoms of physical distress. Continue to progress workloads to maintain intensity without signs/symptoms of physical distress. Continue to progress workloads to maintain intensity without signs/symptoms of physical distress.   Average METs -- 2.5 2.5 2.75 2.5     Resistance Training   Training Prescription -- Yes Yes Yes Yes   Weight -- 3 lb 3 lb 3 lb 3 lb   Reps -- 10-15 10-15 10-15 10-15     Interval Training   Interval Training -- No No No No     Treadmill   MPH -- 1.7 1.9 1.9 1.8   Grade -- 0.5 0.5 0.5 0.5   Minutes -- 15 15 15 15    METs -- 2.42 2.6 2.59 2.59     NuStep   Level -- 4 4 4 3    SPM -- -- 80 -- 80   Minutes -- 15 15 15 15    METs -- 2.5 2.4 2.9 2.4     REL-XR   Level -- 1 -- 1 --   Minutes -- 15 -- 15 --     Home Exercise Plan   Plans to continue exercise at -- -- -- Home (comment)  walking, also considering joining [employer] (comment)  walking, also considering joining [employer] -- -- --  Add 2 additional days to program exercise sessions. Add 2 additional days to program exercise sessions.   Initial Home Exercises Provided -- -- -- 09/24/19 09/24/19   Row Name 10/22/19 1300 11/05/19 1300           Response to Exercise   Blood Pressure (Admit) 128/64 122/66      Blood Pressure (Exercise) 150/62 146/50      Blood Pressure (Exit) 112/64 136/56      Heart Rate (Admit) 74 bpm 72 bpm      Heart Rate (Exercise) 106 bpm 107 bpm      Heart Rate (Exit) 80 bpm 84 bpm      Oxygen Saturation (Admit) 96 % 96 %      Oxygen Saturation (Exercise) 94 % 95 %      Oxygen Saturation (Exit) 97 % 98 %      Rating of Perceived Exertion (Exercise) 13 13      Perceived Dyspnea (Exercise) 3 1      Symptoms none none      Duration Continue with 30 min of aerobic exercise without signs/symptoms of physical distress. Continue with 30 min of aerobic exercise without signs/symptoms of physical distress.      Intensity THRR unchanged THRR unchanged        Progression   Progression Continue to progress workloads to maintain intensity without signs/symptoms  of physical distress. Continue to progress workloads to maintain intensity without signs/symptoms of physical distress.      Average METs 2.96 2.4        Resistance Training   Training Prescription Yes Yes      Weight 3 lb 3 lb      Reps 10-15 10-15        Interval Training   Interval Training No No        Treadmill   MPH 1.9 2      Grade 0.5 0.5      Minutes 15 15      METs 2.59 2.67        NuStep   Level 4 4      SPM -- 80      Minutes 15 15      METs 3.3 2.1        Biostep-RELP   Level 1 --      Minutes 15 --      METs 3 --        Home Exercise Plan   Plans to continue exercise at Home (comment)  walking, also considering joining [employer] --      Frequency Add 2 additional days to program exercise sessions. --      Initial Home Exercises Provided 09/24/19 --             Exercise Comments:   Exercise Goals and  Review:  Exercise Goals    Row Name 08/13/19 1505             Exercise Goals   Increase Physical Activity Yes       Intervention Provide advice, education, support and counseling about physical activity/exercise needs.;Develop an individualized exercise prescription for aerobic and resistive training based on initial evaluation findings, risk stratification, comorbidities and participant's personal goals.       Expected Outcomes Short Term: Attend rehab on a regular basis to increase amount of physical activity.;Long Term: Add in home exercise to make exercise part of routine and to increase amount of physical activity.;Long Term: Exercising regularly at least 3-5 days a week.       Increase Strength and Stamina Yes       Intervention Provide advice, education, support and counseling about physical activity/exercise needs.;Develop an individualized exercise prescription for aerobic and resistive training based on initial evaluation findings, risk stratification, comorbidities and participant's personal goals.       Expected Outcomes Short Term: Increase workloads from initial exercise prescription for resistance, speed, and METs.;Short Term: Perform resistance training exercises routinely during rehab and add in resistance training at home;Long Term: Improve cardiorespiratory fitness, muscular endurance and strength as measured by increased METs and functional capacity (6MWT)       Able to understand and use rate of perceived exertion (RPE) scale Yes       Intervention Provide education and explanation on how to use RPE scale       Expected Outcomes Short Term: Able to use RPE daily in rehab to express subjective intensity level;Long Term:  Able to use RPE to guide intensity level when exercising independently       Able to understand and use Dyspnea scale Yes       Intervention Provide education and explanation on how to use Dyspnea scale       Expected Outcomes Short Term: Able to use Dyspnea  scale daily in rehab to express subjective sense of shortness of breath during exertion;Long Term: Able  to use Dyspnea scale to guide intensity level when exercising independently       Knowledge and understanding of Target Heart Rate Range (THRR) Yes       Intervention Provide education and explanation of THRR including how the numbers were predicted and where they are located for reference       Expected Outcomes Short Term: Able to state/look up THRR;Short Term: Able to use daily as guideline for intensity in rehab;Long Term: Able to use THRR to govern intensity when exercising independently       Able to check pulse independently Yes       Intervention Review the importance of being able to check your own pulse for safety during independent exercise;Provide education and demonstration on how to check pulse in carotid and radial arteries.       Expected Outcomes Short Term: Able to explain why pulse checking is important during independent exercise;Long Term: Able to check pulse independently and accurately       Understanding of Exercise Prescription Yes       Intervention Provide education, explanation, and written materials on patient's individual exercise prescription       Expected Outcomes Short Term: Able to explain program exercise prescription;Long Term: Able to explain home exercise prescription to exercise independently              Exercise Goals Re-Evaluation :  Exercise Goals Re-Evaluation    Row Name 08/15/19 1121 08/27/19 1054 09/03/19 1142 09/10/19 1305 09/24/19 1145     Exercise Goal Re-Evaluation   Exercise Goals Review Increase Physical Activity;Able to understand and use rate of perceived exertion (RPE) scale;Knowledge and understanding of Target Heart Rate Range (THRR);Understanding of Exercise Prescription;Increase Strength and Stamina;Able to understand and use Dyspnea scale;Able to check pulse independently Increase Physical Activity;Understanding of Exercise  Prescription;Increase Strength and Stamina Increase Physical Activity;Understanding of Exercise Prescription;Increase Strength and Stamina Increase Physical Activity;Understanding of Exercise Prescription;Increase Strength and Stamina Increase Physical Activity;Understanding of Exercise Prescription;Increase Strength and Stamina   Comments Reviewed RPE and dyspnea scales, THR and program prescription with pt today.  Pt voiced understanding and was given a copy of goals to take home. Kamrie is off to a good start in rehab.  She has completed her first two full days of exercise She is already up to level 4 on the NuStep.  We will continue to monitor her progress. Bayleigh is doing well in rehab.  She is not a fan on the XR. We will look at moving her around.  She is planning to walk and swim while out on vacation next week. Nadene has increased her speed on TM.  Staff will follow up with her when she returns from vacation. Reviewed home exercise with pt today.  Pt plans to walk/staff videos for exercise.  Reviewed THR, pulse, RPE, sign and symptoms, pulse oximetery and when to call 911 or MD.  Also discussed weather considerations and indoor options.  Pt voiced understanding.   Expected Outcomes Short: Use RPE daily to regulate intensity. Long: Follow program prescription in THR. Short:Continue to attend regularly Long: Continue to follow program prescription Short: Walk and swimmon vacation Long; Continue to improve stamina. Short: stay active on vacation Long: increase MET level Short: add one day of exercise in addition to program sessions Long:  increase stamina   Row Name 09/24/19 1335 10/08/19 1340 10/22/19 1349 11/05/19 1121       Exercise Goal Re-Evaluation   Exercise Goals Review Increase Physical Activity;Understanding of  Exercise Prescription;Increase Strength and Stamina Increase Physical Activity;Understanding of Exercise Prescription;Increase Strength and Stamina Increase Physical  Activity;Understanding of Exercise Prescription;Increase Strength and Stamina Increase Physical Activity;Understanding of Exercise Prescription;Increase Strength and Stamina    Comments Crystalynn is doing well in rehab.  She is now up to level 4 onte NuStep.  We will continue to monitor her progress. Tailynn works at [employer] and reaches THR range during some of her sessions.  Staff will encourage increasing workloads slightly to maintain THR during session. Karliah has been doing well in rehab. She is now up to level 4 on the NuSep.  She is still on level 1 for BioStep, so we will encourage her to increase that workload.  We will continue to monitor her progress. Treadmill - 20 minutes 1x/week - 23mph. Discussed adding in weighted exercises and 150 minutes of cardio exercise.    Expected Outcomes Short: Start to slowly bring up workloads  Long: Continue to improve stamina. Short:  increase workloads Long: improve MET level Short: Increase workload on BioStep. Long: Continue to improve stamina. Short: Increase home exercise Long: Continue to improve stamina.           Discharge Exercise Prescription (Final Exercise Prescription Changes):  Exercise Prescription Changes - 11/05/19 1300      Response to Exercise   Blood Pressure (Admit) 122/66    Blood Pressure (Exercise) 146/50    Blood Pressure (Exit) 136/56    Heart Rate (Admit) 72 bpm    Heart Rate (Exercise) 107 bpm    Heart Rate (Exit) 84 bpm    Oxygen Saturation (Admit) 96 %    Oxygen Saturation (Exercise) 95 %    Oxygen Saturation (Exit) 98 %    Rating of Perceived Exertion (Exercise) 13    Perceived Dyspnea (Exercise) 1    Symptoms none    Duration Continue with 30 min of aerobic exercise without signs/symptoms of physical distress.    Intensity THRR unchanged      Progression   Progression Continue to progress workloads to maintain intensity without signs/symptoms of physical distress.    Average METs 2.4      Resistance Training    Training Prescription Yes    Weight 3 lb    Reps 10-15      Interval Training   Interval Training No      Treadmill   MPH 2    Grade 0.5    Minutes 15    METs 2.67      NuStep   Level 4    SPM 80    Minutes 15    METs 2.1           Nutrition:  Target Goals: Understanding of nutrition guidelines, daily intake of sodium '1500mg'$ , cholesterol '200mg'$ , calories 30% from fat and 7% or less from saturated fats, daily to have 5 or more servings of fruits and vegetables.  Education: Controlling Sodium/Reading Food Labels -Group verbal and written material supporting the discussion of sodium use in heart healthy nutrition. Review and explanation with models, verbal and written materials for utilization of the food label.   Education: General Nutrition Guidelines/Fats and Fiber: -Group instruction provided by verbal, written material, models and posters to present the general guidelines for heart healthy nutrition. Gives an explanation and review of dietary fats and fiber.   Biometrics:  Pre Biometrics - 08/13/19 1505      Pre Biometrics   Height 5' 6.75" (1.695 m)    Weight 225 lb 12.8 oz (  102.4 kg)    BMI (Calculated) 35.65    Single Leg Stand 10.94 seconds            Nutrition Therapy Plan and Nutrition Goals:  Nutrition Therapy & Goals - 10/03/19 1130      Nutrition Therapy   Diet Pulmonary MNT, heart healthy eating    Drug/Food Interactions Statins/Certain Fruits    Protein (specify units) 80g    Fiber 25 grams    Whole Grain Foods 3 servings    Saturated Fats 12 max. grams    Fruits and Vegetables 5 servings/day    Sodium 1.5 grams      Personal Nutrition Goals   Nutrition Goal ST: add granola to ice cream, eat sweet potatos as a snack, add protein source to snack or breakfast LT: improve breathing (now 2/10)    Comments B: tries to eat when she gets up. An egg (soft boiled) toast or or english muffin (whole wheat) - butter. Coffee or hot tea (sugar free  french vanilla creamer). S: L: tuna (mustard and green olives) with crackers (wheat thins (reduced fat)). Diet green tea. D: salmon, no red meat, some portk, mostly chicken or fish (baked). asparagus, lots of salad with other nonstarchy vegetables, baked vegetables (olive oil) S: loves to bake, ice cream Drinks: diet green tea and water Discussed heart healthy eaitng, pulmonary MNT, discussed adding one more good source of protein. Pt reports loving sweet potatos and would like to have granola on her ice cream (will find a healthy version)      Intervention Plan   Intervention Prescribe, educate and counsel regarding individualized specific dietary modifications aiming towards targeted core components such as weight, hypertension, lipid management, diabetes, heart failure and other comorbidities.;Nutrition handout(s) given to patient.    Expected Outcomes Short Term Goal: Understand basic principles of dietary content, such as calories, fat, sodium, cholesterol and nutrients.;Short Term Goal: A plan has been developed with personal nutrition goals set during dietitian appointment.;Long Term Goal: Adherence to prescribed nutrition plan.           Nutrition Assessments:  Nutrition Assessments - 08/13/19 1506      MEDFICTS Scores   Pre Score 49           MEDIFICTS Score Key:          ?70 Need to make dietary changes          40-70 Heart Healthy Diet         ? 40 Therapeutic Level Cholesterol Diet  Nutrition Goals Re-Evaluation:  Nutrition Goals Re-Evaluation    Row Name 09/03/19 1157 11/05/19 1135           Goals   Nutrition Goal Meet with dietician ST: continue current changes LT: improve breathing (now 3/10)      Comment Zamira has already to started to make some healthier changes in her diet and seeing the difference in her weight and how she is feeling. She tries new recipes and has a heart healthy diet. continue current chnages.      Expected Outcome Short: Meet with dietcian  Long: Continue to work on healthy changes. ST: continue current changes LT: improve breathing (now 3/10)             Nutrition Goals Discharge (Final Nutrition Goals Re-Evaluation):  Nutrition Goals Re-Evaluation - 11/05/19 1135      Goals   Nutrition Goal ST: continue current changes LT: improve breathing (now 3/10)    Comment She tries new  recipes and has a heart healthy diet. continue current chnages.    Expected Outcome ST: continue current changes LT: improve breathing (now 3/10)           Psychosocial: Target Goals: Acknowledge presence or absence of significant depression and/or stress, maximize coping skills, provide positive support system. Participant is able to verbalize types and ability to use techniques and skills needed for reducing stress and depression.   Education: Depression - Provides group verbal and written instruction on the correlation between heart/lung disease and depressed mood, treatment options, and the stigmas associated with seeking treatment.   Education: Sleep Hygiene -Provides group verbal and written instruction about how sleep can affect your health.  Define sleep hygiene, discuss sleep cycles and impact of sleep habits. Review good sleep hygiene tips.    Pulmonary Rehab from 10/10/2019 in [facility] Cardiac and Pulmonary Rehab  Date 09/05/19  Educator [facility]  Instruction Review Code 1- Verbalizes Understanding      Education: Stress and Anxiety: - Provides group verbal and written instruction about the health risks of elevated stress and causes of high stress.  Discuss the correlation between heart/lung disease and anxiety and treatment options. Review healthy ways to manage with stress and anxiety.   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      Family Dynamics   Good Support System? Yes    Comments She can look to her son for support. Patient is not able to sleep well.      Barriers   Psychosocial barriers to participate in program The patient should benefit from training in stress management and relaxation.      Screening Interventions   Interventions Encouraged to exercise;To provide support and resources with identified psychosocial needs;Provide feedback about the scores to participant    Expected Outcomes Short Term goal: Utilizing psychosocial counselor, staff and physician to assist with identification of specific Stressors or current issues interfering with healing process. Setting desired goal for each stressor or current issue identified.;Long Term Goal: Stressors or current issues are controlled or eliminated.;Short Term goal: Identification and review with participant of any Quality of Life or Depression concerns found by scoring the questionnaire.;Long Term goal: The participant improves quality of Life and PHQ9 Scores as seen by post scores and/or verbalization of changes           Quality of Life Scores:  Scores of 19 and below usually indicate a poorer quality of life in these areas.  A difference of  2-3 points is a clinically meaningful difference.  A difference of 2-3 points in the total score of the Quality of Life Index has been associated with significant improvement in overall quality of life, self-image, physical symptoms, and general health in studies assessing change in quality of life.  PHQ-9: Recent Review Flowsheet Data    Depression screen [facility] 2/9 08/13/2019 12/11/2018 12/05/2017 11/24/2016 11/24/2015   Decreased Interest 0 0 0 1 0   Down, Depressed, Hopeless 0 0 1 0 0   PHQ - 2 Score 0 0 1 1 0   Altered sleeping 1 0 2 0 -   Tired, decreased energy 2 0  1 2 -    Change in appetite 2 0 2 2 -   Feeling bad or failure about yourself  0 0 1 1 -   Trouble concentrating 0 0 0 0 -   Moving slowly or fidgety/restless 0 0 0 0 -   Suicidal thoughts 0 0 0 0 -   PHQ-9 Score 5 0 7 6 -   Difficult doing work/chores Somewhat difficult Not difficult at all Not difficult at all Not difficult at all -     Interpretation of Total Score  Total Score Depression Severity:  1-4 = Minimal depression, 5-9 = Mild depression, 10-14 = Moderate depression, 15-19 = Moderately severe depression, 20-27 = Severe depression   Psychosocial Evaluation and Intervention:  Psychosocial Evaluation - 08/07/19 1405      Psychosocial Evaluation & Interventions   Interventions Encouraged to exercise with the program and follow exercise prescription    Comments 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 She can look to her son for support. Patient is not able to sleep well.    Expected Outcomes Short: Continue to exercise regularly to support mental health and notify staff of any changes. Long: maintain mental health and well being through teaching of rehab or prescribed medications independently.    Continue Psychosocial Services  Follow up required by staff           Psychosocial Re-Evaluation:  Psychosocial Re-Evaluation    Row Name 09/03/19 1146 09/24/19 1138 11/05/19 1125         Psychosocial Re-Evaluation   Current issues with Current Stress Concerns;Current Sleep Concerns -- Current Stress Concerns;Current Sleep Concerns     Comments Zyniah is doing well in rehab.  She is going to the beach for the week next week to [facility]. She is looking forward to her trip to just get away for a few days.  She continues to have trouble sleeping, so I sent her information to review. Keriana still doesnt sleep well.  She has trouble going back to sleep if she gets up.  Other than sleep, she feels good mentally. Her family gives her stress and her plumbing has  been backed up which is causing her stress. Shakeisha is still has trouble sleeping and thinks she may need a new mattress in addition to stress. Discussed sleep hygiene.     Expected Outcomes Short: Review sleep info and enjoy vacation Long; Continue to stay positive. Short: continue to work on sleeping better Long: maintain positive outlook Short: continue to work on sleeping better Long: maintain positive outlook     Interventions Encouraged to attend Pulmonary Rehabilitation for the exercise;Stress management education -- Encouraged to attend Pulmonary Rehabilitation for the exercise;Stress management education     Continue Psychosocial Services  Follow up required by staff -- Follow up required by staff     Comments -- -- working on stress and sleep management       Initial Review   Source of Stress Concerns -- -- Chronic Illness;Unable to participate in former interests or hobbies            Psychosocial Discharge (Final Psychosocial Re-Evaluation):  Psychosocial Re-Evaluation - 11/05/19 1125      Psychosocial Re-Evaluation   Current issues with Current Stress Concerns;Current Sleep Concerns    Comments Her family gives her stress and her plumbing has been backed up which is causing her stress. Ravan is still has trouble sleeping and thinks she may  need a new mattress in addition to stress. Discussed sleep hygiene.    Expected Outcomes Short: continue to work on sleeping better Long: maintain positive outlook    Interventions Encouraged to attend Pulmonary Rehabilitation for the exercise;Stress management education    Continue Psychosocial Services  Follow up required by staff    Comments working on stress and sleep management      Initial Review   Source of Stress Concerns Chronic Illness;Unable to participate in former interests or hobbies           Education: Education Goals: Education classes will be provided on a weekly basis, covering required topics. Participant will state  understanding/return demonstration of topics presented.  Learning Barriers/Preferences:  Learning Barriers/Preferences - 08/07/19 1403      Learning Barriers/Preferences   Learning Barriers None    Learning Preferences None           General Pulmonary Education Topics:  Infection Prevention: - Provides verbal and written material to individual with discussion of infection control including proper hand washing and proper equipment cleaning during exercise session.   Pulmonary Rehab from 10/10/2019 in [facility] Cardiac and Pulmonary Rehab  Date 08/13/19  Educator [facility]  Instruction Review Code 1- Verbalizes Understanding      Falls Prevention: - Provides verbal and written material to individual with discussion of falls prevention and safety.   Pulmonary Rehab from 10/10/2019 in [facility] Cardiac and Pulmonary Rehab  Date 08/13/19  Educator [facility]  Instruction Review Code 1- Verbalizes Understanding      Chronic Lung Diseases: - Group verbal and written instruction to review updates, respiratory medications, advancements in procedures and treatments. Discuss use of supplemental oxygen including available portable oxygen systems, continuous and intermittent flow rates, concentrators, personal use and safety guidelines. Review proper use of inhaler and spacers. Provide informative websites for self-education.    Pulmonary Rehab from 10/10/2019 in [facility] Cardiac and Pulmonary Rehab  Date 09/19/19  Educator jh  Instruction Review Code 1- Verbalizes Understanding      Energy Conservation: - Provide group verbal and written instruction for methods to conserve energy, plan and organize activities. Instruct on pacing techniques, use of adaptive equipment and posture/positioning to relieve shortness of breath.   Triggers and Exacerbations: - Group verbal and written instruction to review types of environmental triggers and ways to prevent exacerbations. Discuss weather changes, air quality and the  benefits of nasal washing. Review warning signs and symptoms to help prevent infections. Discuss techniques for effective airway clearance, coughing, and vibrations.   AED/CPR: - Group verbal and written instruction with the use of models to demonstrate the basic use of the AED with the basic ABC's of resuscitation.   Anatomy and Physiology of the Lungs: - Group verbal and written instruction with the use of models to provide basic lung anatomy and physiology related to function, structure and complications of lung disease.   Anatomy & Physiology of the Heart: - Group verbal and written instruction and models provide basic cardiac anatomy and physiology, with the coronary electrical and arterial systems. Review of Valvular disease and Heart Failure   Cardiac Medications: - Group verbal and written instruction to review commonly prescribed medications for heart disease. Reviews the medication, class of the drug, and side effects.   Pulmonary Rehab from 10/10/2019 in [facility] Cardiac and Pulmonary Rehab  Date 09/26/19  Educator SB  Instruction Review Code 1- Verbalizes Understanding      Other: -Provides group and verbal instruction on various topics (see comments)  Knowledge Questionnaire Score:  Knowledge Questionnaire Score - 08/13/19 1506      Knowledge Questionnaire Score   Pre Score 11/18 Education Focus; Pulm A&P, O2 safety            Core Components/Risk Factors/Patient Goals at Admission:  Personal Goals and Risk Factors at Admission - 08/13/19 1506      Core Components/Risk Factors/Patient Goals on Admission    Weight Management Yes;Weight Loss;Obesity    Intervention Weight Management: Develop a combined nutrition and exercise program designed to reach desired caloric intake, while maintaining appropriate intake of nutrient and fiber, sodium and fats, and appropriate energy expenditure required for the weight goal.;Weight Management: Provide education and appropriate  resources to help participant work on and attain dietary goals.;Weight Management/Obesity: Establish reasonable short term and long term weight goals.;Obesity: Provide education and appropriate resources to help participant work on and attain dietary goals.    Admit Weight 225 lb 12.8 oz (102.4 kg)    Goal Weight: Short Term 220 lb (99.8 kg)    Goal Weight: Long Term 215 lb (97.5 kg)    Expected Outcomes Long Term: Adherence to nutrition and physical activity/exercise program aimed toward attainment of established weight goal;Short Term: Continue to assess and modify interventions until short term weight is achieved;Weight Loss: Understanding of general recommendations for a balanced deficit meal plan, which promotes 1-2 lb weight loss per week and includes a negative energy balance of [phone] kcal/d;Understanding recommendations for meals to include 15-35% energy as protein, 25-35% energy from fat, 35-60% energy from carbohydrates, less than $RemoveB'200mg'FfAvGIVh$  of dietary cholesterol, 20-35 gm of total fiber daily;Understanding of distribution of calorie intake throughout the day with the consumption of 4-5 meals/snacks    Improve shortness of breath with ADL's Yes    Intervention Provide education, individualized exercise plan and daily activity instruction to help decrease symptoms of SOB with activities of daily living.    Expected Outcomes Short Term: Improve cardiorespiratory fitness to achieve a reduction of symptoms when performing ADLs;Long Term: Be able to perform more ADLs without symptoms or delay the onset of symptoms    Lipids Yes    Intervention Provide education and support for participant on nutrition & aerobic/resistive exercise along with prescribed medications to achieve LDL '70mg'$ , HDL >$Remo'40mg'GMtNI$ .    Expected Outcomes Short Term: Participant states understanding of desired cholesterol values and is compliant with medications prescribed. Participant is following exercise prescription and nutrition  guidelines.;Long Term: Cholesterol controlled with medications as prescribed, with individualized exercise RX and with personalized nutrition plan. Value goals: LDL < $Rem'70mg'YNCw$ , HDL > 40 mg.           Education:Diabetes - Individual verbal and written instruction to review signs/symptoms of diabetes, desired ranges of glucose level fasting, after meals and with exercise. Acknowledge that pre and post exercise glucose checks will be done for 3 sessions at entry of program.   Education: Know Your Numbers and Risk Factors: -Group verbal and written instruction about important numbers in your health.  Discussion of what are risk factors and how they play a role in the disease process.  Review of Cholesterol, Blood Pressure, Diabetes, and BMI and the role they play in your overall health.   Pulmonary Rehab from 10/10/2019 in [facility] Cardiac and Pulmonary Rehab  Date 10/10/19  Educator SB  Instruction Review Code 1- Verbalizes Understanding      Core Components/Risk Factors/Patient Goals Review:   Goals and Risk Factor Review    Row Name 09/03/19 1144 09/24/19  1135 11/05/19 1134         Core Components/Risk Factors/Patient Goals Review   Personal Goals Review Weight Management/Obesity;Improve shortness of breath with ADL's;Hypertension Weight Management/Obesity;Improve shortness of breath with ADL's;Hypertension Weight Management/Obesity;Improve shortness of breath with ADL's;Hypertension     Review Evangelyne is doing well in rehab.  Her weight is trending down.  She just doesn't like our scale compared to hers at home. Her blood pressures are good in class and she does not check them at home.  Her doctor wants her to check it.  Her breathing is doing well. Uva just got back from vacation so her weight may be up.  She doesnt monitor BP at home.  She has a time scheduled to meet with RD about diet. She doesnt monitor BP at home, she has a cuff at home and will start monitoring it.     Expected Outcomes  Short: Start checking blood pressures. Long: Continue to monitor risk factors. Short: neet with RD Long: manage risk factors Short: PLB anf monitor BP Long: manage risk factors            Core Components/Risk Factors/Patient Goals at Discharge (Final Review):   Goals and Risk Factor Review - 11/05/19 1134      Core Components/Risk Factors/Patient Goals Review   Personal Goals Review Weight Management/Obesity;Improve shortness of breath with ADL's;Hypertension    Review She doesnt monitor BP at home, she has a cuff at home and will start monitoring it.    Expected Outcomes Short: PLB anf monitor BP Long: manage risk factors           ITP Comments:  ITP Comments    Row Name 08/07/19 1413 08/13/19 1458 08/14/19 0653 08/15/19 1120 09/11/19 1050   ITP Comments Virtual Visit completed. Patient informed on EP and RD appointment and 6 Minute walk test. Patient also informed of patient health questionnaires on My Chart. Patient Verbalizes understanding. Visit diagnosis can be found in [facility] 08/05/2019. Completed 6MWT and gym orientation.  Initial ITP created and sent for review to Dr. Emily Filbert, Medical Director. 30 Day review completed. Medical Director ITP review done, changes made as directed, and signed approval by Medical Director. First full day of exercise!  Patient was oriented to gym and equipment including functions, settings, policies, and procedures.  Patient's individual exercise prescription and treatment plan were reviewed.  All starting workloads were established based on the results of the 6 minute walk test done at initial orientation visit.  The plan for exercise progression was also introduced and progression will be customized based on patient's performance and goals. 30 Day review completed. Medical Director ITP review done, changes made as directed, and signed approval by Medical Director.   [place] Name 10/09/19 0629 11/06/19 1615         ITP Comments 30 Day review completed.  Medical Director ITP review done, changes made as directed, and signed approval by Medical Director. 30 day review completed. ITP sent to Dr. Emily Filbert, Medical Director of Cardiac and Pulmonary Rehab. Continue with ITP unless changes are made by physician.             Comments: 30 day review

## 2019-11-07 ENCOUNTER — Other Ambulatory Visit: Payer: Self-pay

## 2019-11-07 ENCOUNTER — Encounter: Payer: Medicare HMO | Admitting: *Deleted

## 2019-11-07 DIAGNOSIS — J449 Chronic obstructive pulmonary disease, unspecified: Secondary | ICD-10-CM

## 2019-11-07 NOTE — Progress Notes (Signed)
Daily Session Note  Patient Details  Name: Hannah Green MRN: [id number] Date of Birth: 06-20-1945 Referring Provider:     Pulmonary Rehab from 08/13/2019 in [facility] Cardiac and Pulmonary Rehab  Referring Provider [name] DO      Encounter Date: 11/07/2019  Check In:  Session Check In - 11/07/19 1111      Check-In   Supervising physician immediately available to respond to emergencies See telemetry face sheet for immediately available ER MD    Location [facility]-Cardiac & Pulmonary Rehab    Staff Present Renita Papa, RN BSN;Joseph [address] [place], [place], [place], CCRP, CCET    Virtual Visit No    Medication changes reported     No    Fall or balance concerns reported    No    Warm-up and Cool-down Performed on first and last piece of equipment    Resistance Training Performed Yes    VAD Patient? No    PAD/SET Patient? No      Pain Assessment   Currently in Pain? No/denies              Social History   Tobacco Use  Smoking Status Former Smoker  . Packs/day: 1.50  . Years: 50.00  . Pack years: 75.00  . Types: Cigarettes  . Quit date: 04/01/2011  . Years since quitting: 8.6  Smokeless Tobacco Never Used  Tobacco Comment   has stopped but has started again in "crisis"    Goals Met:  Independence with exercise equipment Exercise tolerated well No report of cardiac concerns or symptoms Strength training completed today  Goals Unmet:  Not Applicable  Comments: Pt able to follow exercise prescription today without complaint.  Will continue to monitor for progression.    Dr. Emily Filbert is Medical Director for [place] and [facility].

## 2019-11-13 ENCOUNTER — Ambulatory Visit: Payer: Medicare HMO | Admitting: Cardiology

## 2019-11-13 NOTE — Progress Notes (Deleted)
Primary Care Provider: Jinny Sanders, MD Cardiologist: Glenetta Hew, MD Electrophysiologist: None  Clinic Note: No chief complaint on file.   HPI:    Hannah Green is a 74 y.o. female with a PMH Severe COPD (by GOLD) & STage 1A SCCa of RUL (s/p R Sided VATS bilobectomy - complicated by post-OP Afib)) with normal Echo & R/LHC below who presents today for ~2 month f/u for LE swelling & SOB/DOE.   01/14/2019 --> doing fairly well from a cardiac standpoint.  Noticing more pronounced dyspnea since her Lung CA surgery and not feeling well since.  Had not yet been on to get into activity because of COVID-19 restrictions of gym access.  No chest pain or pressure.  Just DOE.  Occasional palpitations but stable.  Hannah Green was last seen on 08/29/2019 - Noted R>L edema w/o more PND/Orthopnea. Off & on rapid HR ~30 sec up to 10-15 min described as pounding rapidly associated with SOB. Diffuse episodes of CP (L mid-sternal) worse with deep inspiration & not exertion.  Stable baseline SOB. --> Echo & LE V Dopplers ordered.   Recent Hospitalizations: None  Reviewed  CV studies:    The following studies were reviewed today: (if available, images/films reviewed: From Epic Chart or Care Everywhere)  . TTE (09/23/2019): EF 70-75%, Hyperdynamic LV - NO RWMA. Mild LVH. Mild MR. Normal PAP/RAP . LE Vein DVT Study: R - NO DVT. NO obstruction proximal to inguinal ligament. NO Left CFV obstruction   Interval History:   Hannah Green is here today for routine follow-up with a few complaints.  She has been having right leg greater than left swelling-last week she was not even able to put her shoe on.  Currently looks little better than it has.  Definitely the right is worse than the left and she has not had any injuries. ->  No real associated PND or orthopnea.  For the past couple months she has been noticing off and on episodes of fast heart rates that can last anywhere from 30 seconds to 10  had 15 minutes.  She describes it as feeling her heart pounding rapidly, but not necessarily irregular.  She does feel short of breath with these episodes, but has not really felt lightheaded or dizzy.  She has had diffuse episodes of chest discomfort on the left midsternal wall as well as underneath the breast.  Sometimes he also has on the right side.  Worse with deep inspiration and with certain movements.  Not necessarily worse with exertion.  At baseline she is short of breath with exertion, but this is stable not actually worse or better than it has been before.  The chest discomfort she feels is not with exertion and not associated with dyspnea.  Palpitations are not necessarily exertional and not associated with dyspnea.  CV Review of Symptoms (Summary) Cardiovascular ROS: positive for - chest pain, dyspnea on exertion, edema and rapid heart rate negative for - orthopnea, paroxysmal nocturnal dyspnea, shortness of breath or Syncope/near syncope, TIA/amaurosis fugax, claudication  The patient does not have symptoms concerning for COVID-19 infection (fever, chills, cough, or new shortness of breath).  The patient is practicing social distancing & Masking.    REVIEWED OF SYSTEMS   Review of Systems  Constitutional: Positive for malaise/fatigue. Negative for weight loss.  HENT: Negative for congestion and nosebleeds.   Respiratory: Positive for cough and shortness of breath. Negative for sputum production and wheezing.   Cardiovascular: Positive for  leg swelling (R>L).  Gastrointestinal: Negative for blood in stool, constipation and melena.  Genitourinary: Negative for frequency and hematuria.  Musculoskeletal: Positive for joint pain (with leg swelling). Negative for falls.  Neurological: Negative for focal weakness and headaches.  Psychiatric/Behavioral: Negative.    I have reviewed and (if needed) personally updated the patient's problem list, medications, allergies, past medical  and surgical history, social and family history.   PAST MEDICAL HISTORY   Past Medical History:  Diagnosis Date  . Breast cancer ([place]) 1999  . Cataract 2017   resolved with surgery  . COPD (chronic obstructive pulmonary disease) ([place])   . Coronary artery disease   . GERD (gastroesophageal reflux disease)   . Hyperlipidemia   . Postoperative atrial fibrillation ([place]) 03/2018  . Rheumatoid arthritis(714.0)   . Squamous cell carcinoma of lung, stage I, right ([place]) 2020   Status post right upper and middle lobectomy    PAST SURGICAL HISTORY   Past Surgical History:  Procedure Laterality Date  . BREAST LUMPECTOMY  1999   left, needed radiation  . CATARACT EXTRACTION W/ INTRAOCULAR LENS  IMPLANT, BILATERAL Bilateral 05/19/15, 06/02/15  . CHEST TUBE INSERTION Right 04/23/2018   Procedure: Right Lateral Chest Tube Insertion;  Surgeon: Melrose Nakayama, MD;  Location: [place];  Service: Thoracic;  Laterality: Right;  . CHOLECYSTECTOMY    . LOBECTOMY Right 04/23/2018   Procedure: RIGHT UPPER LOBECTOMY;  Surgeon: Melrose Nakayama, MD;  Location: [place];  Service: Thoracic;  Laterality: Right;  . NM MYOVIEW LTD  01/2016   LOW RISK. Hypertensive response to exercise followed by significant drop (stage I pressure 211/70 drop to 118/75. Stage II). Apical artifact but otherwise no ischemia or infarction. EF 64%.  Marland Kitchen NODE DISSECTION Right 04/23/2018   Procedure: MEDIASTINAL NODE DISSECTION;  Surgeon: Melrose Nakayama, MD;  Location: [place];  Service: Thoracic;  Laterality: Right;  . RIGHT/LEFT HEART CATH AND CORONARY ANGIOGRAPHY N/A 04/10/2018   Procedure: RIGHT/LEFT HEART CATH AND CORONARY ANGIOGRAPHY;  Surgeon: Leonie Man, MD;  Location: [place] CV LAB;; angiographically minimal coronary artery disease.  Normal LVEDP.  Basely normal RHC pressures with no suggestion of pulmonary hypertension.  Normal CO-CI by Fick (5.9-2.8)  . TONSILLECTOMY    . TRANSTHORACIC ECHOCARDIOGRAM   11/'17; 2/'20   a) Nov 2017: Mild LVH. EF 60-65%. GR 2-DD; b) Feb 2020: Normal LV size and function.  EF 60 to 65%.  No R WMA.  GR 1 DD.  Normal RV size and function.  Normal pressures.  No valvular disease.  Marland Kitchen VIDEO ASSISTED THORACOSCOPY (VATS)/WEDGE RESECTION Right 04/23/2018   Procedure: VIDEO ASSISTED THORACOSCOPY (VATS)/WEDGE RESECTION OF RIGHT UPPER LUNG;  Surgeon: Melrose Nakayama, MD;  Location: [facility] OR;  Service: Thoracic;  Laterality: Right;    MEDICATIONS/ALLERGIES   No outpatient medications have been marked as taking for the 11/13/19 encounter (Appointment) with Leonie Man, MD.    Allergies  Allergen Reactions  . Adhesive [Tape] Rash    Paper tape causes rash  . Neosporin [Neomycin-Bacitracin Zn-Polymyx] Itching  . Penicillins Rash    Did it involve swelling of the face/tongue/throat, SOB, or low BP? No Did it involve sudden or severe rash/hives, skin peeling, or any reaction on the inside of your mouth or nose? Yes Did you need to seek medical attention at a hospital or doctor's office? Yes When did it last happen? From childhood If all above answers are "NO", may proceed with cephalosporin use.  SOCIAL HISTORY/FAMILY HISTORY   Reviewed in Epic:  Pertinent findings: No new changes  OBJCTIVE -PE, EKG, labs   Wt Readings from Last 3 Encounters:  08/29/19 226 lb (102.5 kg)  08/13/19 225 lb 12.8 oz (102.4 kg)  08/05/19 225 lb 12.8 oz (102.4 kg)    Physical Exam: LMP  (LMP Unknown)  Physical Exam Constitutional:      General: She is not in acute distress.    Appearance: Normal appearance. She is obese. She is not toxic-appearing.  HENT:     Head: Normocephalic and atraumatic.  Neck:     Vascular: No carotid bruit, hepatojugular reflux or JVD.  Cardiovascular:     Rate and Rhythm: Normal rate and regular rhythm.  No extrasystoles are present.    Chest Wall: PMI is not displaced (Difficult to assess due to body habitus.).     Pulses: Normal pulses.       Heart sounds: Normal heart sounds. No murmur heard.  No friction rub. No S4 sounds.   Pulmonary:     Effort: Pulmonary effort is normal. No respiratory distress.     Breath sounds: No wheezing or rales.     Comments: Diminished right-sided breath sounds, but clear Chest:     Chest wall: Tenderness (Point tenderness along the second and third costochondral margin as well as the floating rib 7 and 8 costal margins.  This does reproduce her pain.) present.  Musculoskeletal:        General: Swelling present.     Cervical back: Normal range of motion and neck supple.     Right lower leg: Edema (2+) present.     Left lower leg: Edema (Trace- 1+) present.  Neurological:     General: No focal deficit present.     Mental Status: She is alert and oriented to person, place, and time.  Psychiatric:        Mood and Affect: Mood normal.        Behavior: Behavior normal.        Thought Content: Thought content normal.        Judgment: Judgment normal.      Adult ECG Report n/a  Recent Labs:  Lab Results  Component Value Date   CHOL 218 (H) 12/11/2018   HDL 57.90 12/11/2018   LDLCALC 127 (H) 12/11/2018   LDLDIRECT 136.2 06/03/2011   TRIG 165.0 (H) 12/11/2018   CHOLHDL 4 12/11/2018   Lab Results  Component Value Date   CREATININE 0.72 07/09/2019   BUN 12 07/09/2019   NA 140 07/09/2019   K 5.0 07/09/2019   CL 104 07/09/2019   CO2 27 07/09/2019   Lab Results  Component Value Date   TSH 2.469 04/28/2018    ASSESSMENT/PLAN    Problem List Items Addressed This Visit    None       COVID-19 Education: The signs and symptoms of COVID-19 were discussed with the patient and how to seek care for testing (follow up with PCP or arrange E-visit).   The importance of social distancing and COVID-19 vaccination was discussed today.  I spent a total of 92minutes with the patient. >  50% of the time was spent in direct patient consultation.  Additional time spent with chart review   / charting (studies, outside notes, etc): 6 Total Time: 32 min   Current medicines are reviewed at length with the patient today.  (+/- concerns) none  Notice: This dictation was prepared with Dragon dictation along with smaller  Company secretary. Any transcriptional errors that result from this process are unintentional and may not be corrected upon review.  Patient Instructions / Medication Changes & Studies & Tests Ordered   There are no Patient Instructions on file for this visit.   Studies Ordered:   No orders of the defined types were placed in this encounter.    Glenetta Hew, M.D., M.S. Interventional Cardiologist   Pager # [phone] Phone # [phone] [address]. [place], [place] [address]   Thank you for choosing Heartcare at [facility]!!

## 2019-11-19 ENCOUNTER — Encounter: Payer: Self-pay | Admitting: *Deleted

## 2019-11-19 DIAGNOSIS — J449 Chronic obstructive pulmonary disease, unspecified: Secondary | ICD-10-CM

## 2019-11-21 ENCOUNTER — Other Ambulatory Visit: Payer: Self-pay

## 2019-11-21 ENCOUNTER — Encounter: Payer: Medicare HMO | Admitting: *Deleted

## 2019-11-21 DIAGNOSIS — J449 Chronic obstructive pulmonary disease, unspecified: Secondary | ICD-10-CM | POA: Diagnosis not present

## 2019-11-21 NOTE — Progress Notes (Signed)
Daily Session Note  Patient Details  Name: Hannah Green MRN: [id number] Date of Birth: 1945-12-31 Referring Provider:     Pulmonary Rehab from 08/13/2019 in [facility] Cardiac and Pulmonary Rehab  Referring Provider [name] DO      Encounter Date: 11/21/2019  Check In:  Session Check In - 11/21/19 1122      Check-In   Supervising physician immediately available to respond to emergencies See telemetry face sheet for immediately available ER MD    Location [facility]-Cardiac & Pulmonary Rehab    Staff Present Hope Budds RDN, LDN;[facility], RN Margurite Auerbach, MS Exercise Physiologist;Jessica [place], MA, RCEP, CCRP, CCET    Virtual Visit No    Medication changes reported     No    Fall or balance concerns reported    No    Warm-up and Cool-down Performed on first and last piece of equipment    Resistance Training Performed Yes    VAD Patient? No    PAD/SET Patient? No      Pain Assessment   Currently in Pain? No/denies              Social History   Tobacco Use  Smoking Status Former Smoker  . Packs/day: 1.50  . Years: 50.00  . Pack years: 75.00  . Types: Cigarettes  . Quit date: 04/01/2011  . Years since quitting: 8.6  Smokeless Tobacco Never Used  Tobacco Comment   has stopped but has started again in "crisis"    Goals Met:  Independence with exercise equipment Exercise tolerated well No report of cardiac concerns or symptoms Strength training completed today  Goals Unmet:  Not Applicable  Comments: Pt able to follow exercise prescription today without complaint.  Will continue to monitor for progression.    Dr. Emily Filbert is Medical Director for [place] and [facility].

## 2019-11-26 ENCOUNTER — Encounter: Payer: Medicare HMO | Admitting: *Deleted

## 2019-11-26 ENCOUNTER — Other Ambulatory Visit: Payer: Self-pay

## 2019-11-26 DIAGNOSIS — J449 Chronic obstructive pulmonary disease, unspecified: Secondary | ICD-10-CM | POA: Diagnosis not present

## 2019-11-26 NOTE — Progress Notes (Signed)
Daily Session Note  Patient Details  Name: Hannah Green MRN: [id number] Date of Birth: 02/12/1946 Referring Provider:     Pulmonary Rehab from 08/13/2019 in [facility] Cardiac and Pulmonary Rehab  Referring Provider [name] DO      Encounter Date: 11/26/2019  Check In:  Session Check In - 11/26/19 1202      Check-In   Supervising physician immediately available to respond to emergencies See telemetry face sheet for immediately available ER MD    Location [facility]-Cardiac & Pulmonary Rehab    Staff Present Darel Hong, RN BSN;Melissa Caiola RDN, LDN;Kimoni Pagliarulo, RN, BSN, CCRP;Amanda Sommer, BA, ACSM CEP, Exercise Physiologist    Virtual Visit No    Medication changes reported     No    Fall or balance concerns reported    No    Warm-up and Cool-down Performed on first and last piece of equipment    Resistance Training Performed Yes    VAD Patient? No    PAD/SET Patient? No      Pain Assessment   Currently in Pain? No/denies              Social History   Tobacco Use  Smoking Status Former Smoker  . Packs/day: 1.50  . Years: 50.00  . Pack years: 75.00  . Types: Cigarettes  . Quit date: 04/01/2011  . Years since quitting: 8.6  Smokeless Tobacco Never Used  Tobacco Comment   has stopped but has started again in "crisis"    Goals Met:  Proper associated with RPD/PD & O2 Sat Independence with exercise equipment Exercise tolerated well No report of cardiac concerns or symptoms  Goals Unmet:  Not Applicable  Comments: Pt able to follow exercise prescription today without complaint.  Will continue to monitor for progression.    Dr. Emily Filbert is Medical Director for [place] and [facility].

## 2019-11-28 IMAGING — DX DG CHEST 1V PORT
1 series · 1 of 1 positions shown · non-contrast
Comparison: 04/23/2018

CLINICAL DATA: Status post right upper lobectomy.

EXAM:
PORTABLE CHEST 1 VIEW

[chest]
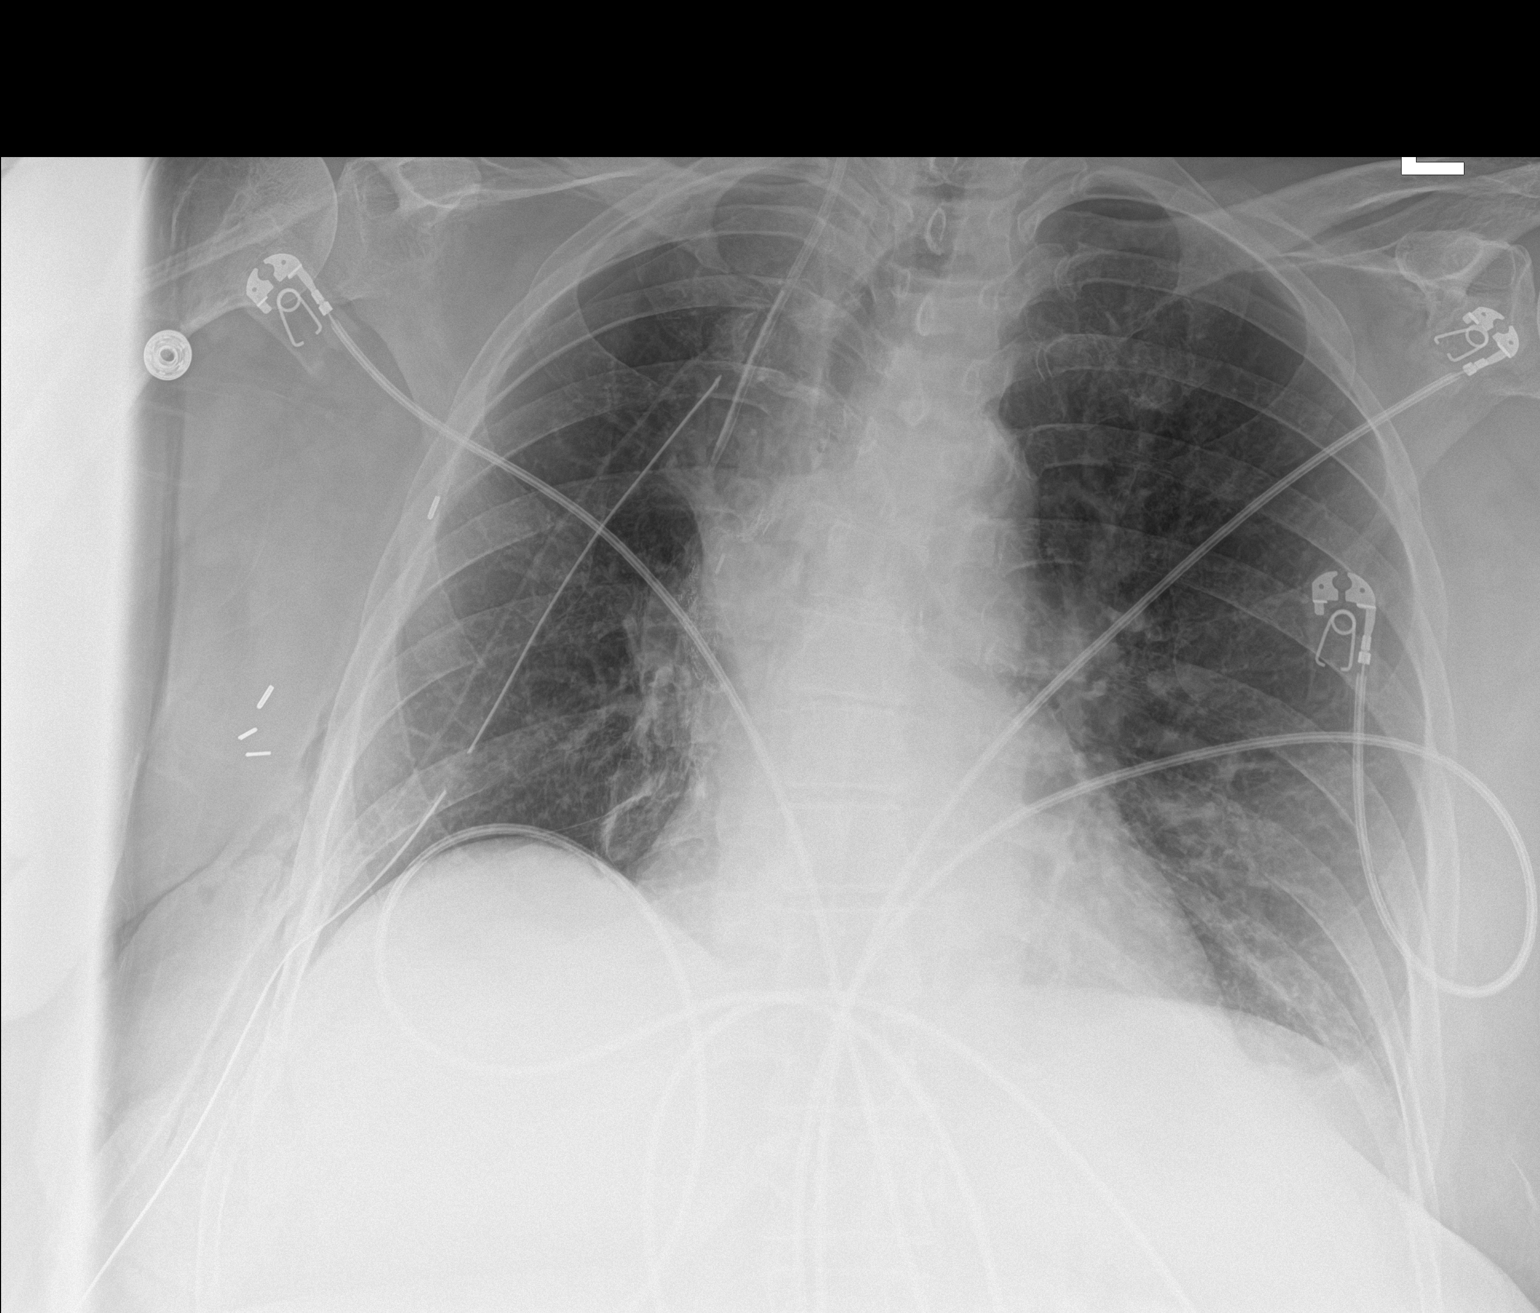

[1 of 1 positions shown; findings below may reference images not displayed]

FINDINGS: There is a right-sided chest tube in place. Right apical
pneumothorax measures 10 mm in thickness, unchanged from previous
exam. Normal heart size. No pleural effusions or edema. No airspace
opacities.
IMPRESSION: 1. Stable position of right chest tube with persistent small right
apical pneumothorax.

## 2019-11-30 IMAGING — DX DG CHEST 1V PORT
1 series · 1 of 1 positions shown · non-contrast
Comparison: 04/25/2018.

CLINICAL DATA: Pneumothorax.  Chest tube.

EXAM:
PORTABLE CHEST 1 VIEW

[chest ap]
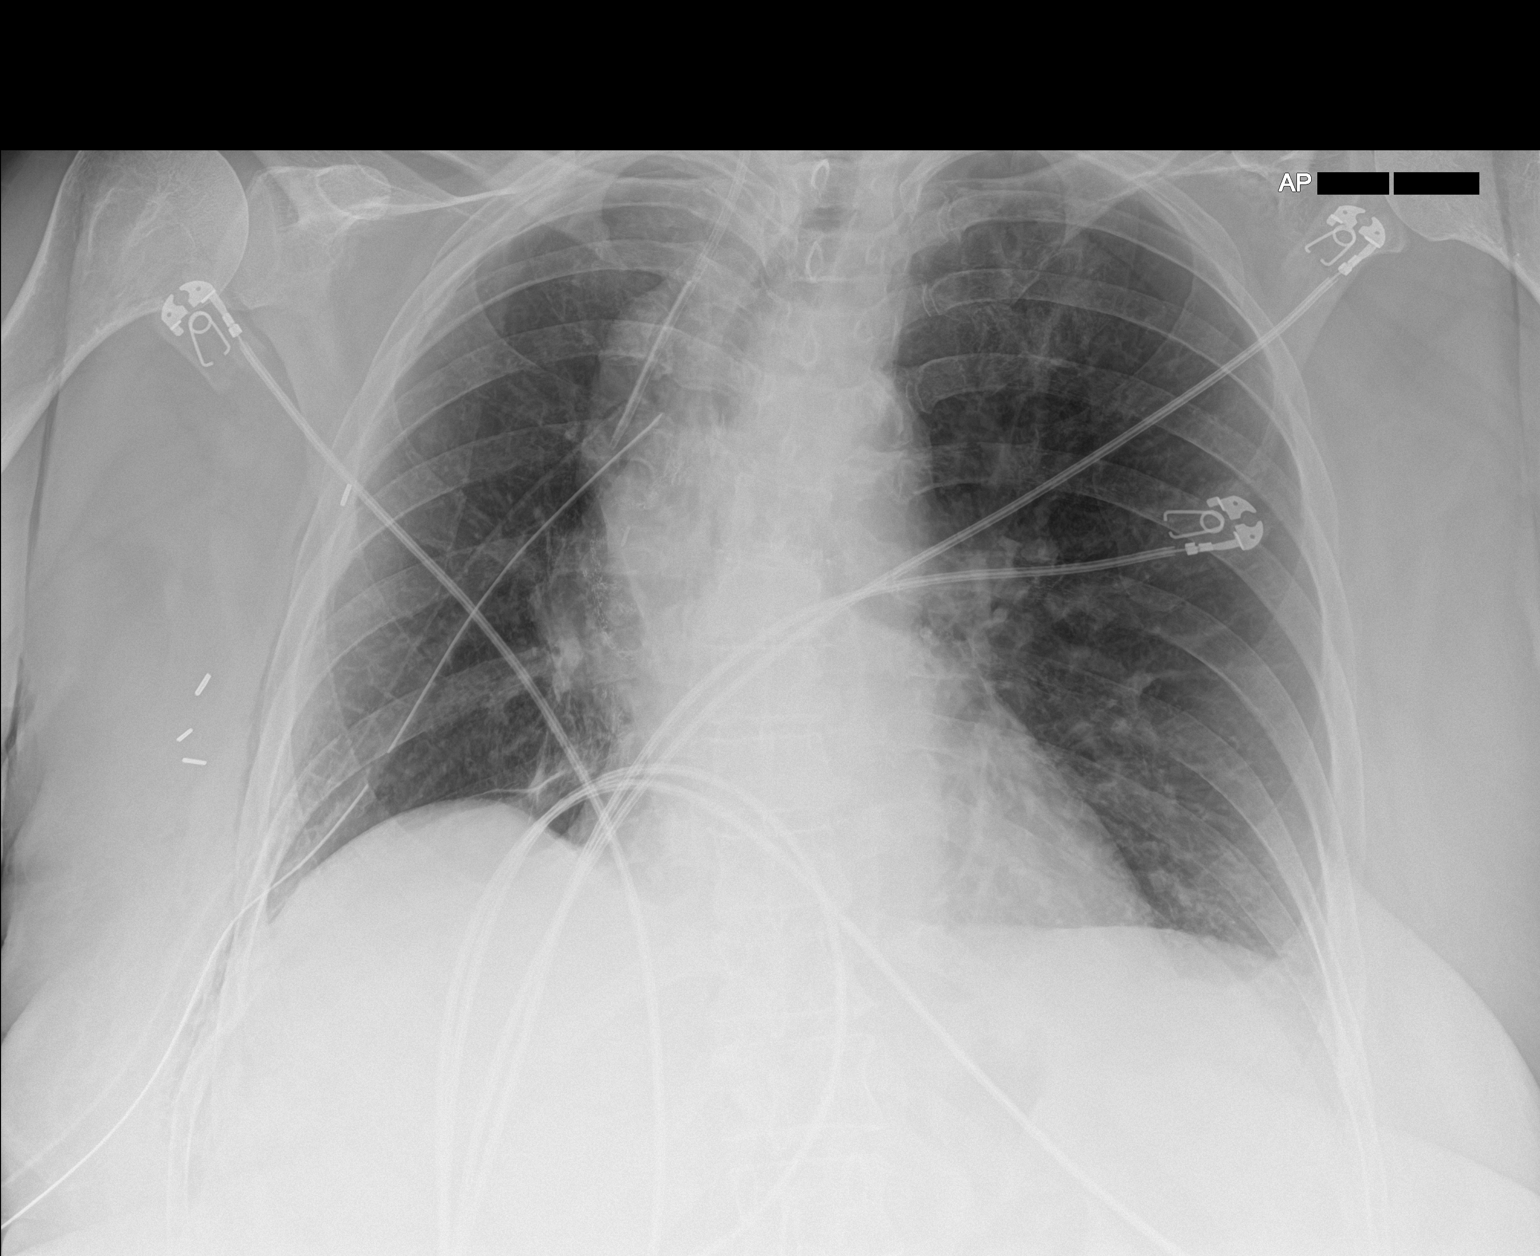

[1 of 1 positions shown; findings below may reference images not displayed]

FINDINGS: Right IJ line stable position. Right chest tube in stable position.
Stable tiny right pneumothorax. Postsurgical changes right lung.
Mild bibasilar atelectasis. Cardiomegaly. No acute bony abnormality.
IMPRESSION: 1. Right IJ line and right chest tube in stable position. Tiny right
pneumothorax.

2.  Postsurgical changes right lung.  Mild bibasilar atelectasis.

## 2019-12-03 ENCOUNTER — Encounter: Payer: Medicare HMO | Attending: Pulmonary Disease

## 2019-12-03 DIAGNOSIS — J449 Chronic obstructive pulmonary disease, unspecified: Secondary | ICD-10-CM | POA: Insufficient documentation

## 2019-12-04 ENCOUNTER — Encounter: Payer: Self-pay | Admitting: *Deleted

## 2019-12-04 DIAGNOSIS — J449 Chronic obstructive pulmonary disease, unspecified: Secondary | ICD-10-CM

## 2019-12-04 NOTE — Progress Notes (Signed)
Pulmonary Individual Treatment Plan  Patient Details  Name: Hannah Green MRN: [id number] Date of Birth: [birth year] Referring Provider:     Pulmonary Rehab from 08/13/2019 in [facility] Cardiac and Pulmonary Rehab  Referring Provider [name] DO      Initial Encounter Date:    Pulmonary Rehab from 08/13/2019 in [facility] Cardiac and Pulmonary Rehab  Date 08/13/19      Visit Diagnosis: Stage 3 severe COPD by GOLD classification ([place])  Patient's Home Medications on Admission:  Current Outpatient Medications:  .  albuterol (VENTOLIN HFA) 108 (90 Base) MCG/ACT inhaler, TAKE 2 PUFFS BY MOUTH EVERY 6 HOURS AS NEEDED FOR WHEEZE OR SHORTNESS OF BREATH, Disp: 8.5 g, Rfl: 3 .  aspirin EC 81 MG tablet, Take 1 tablet (81 mg total) by mouth daily., Disp: 90 tablet, Rfl: 3 .  atorvastatin (LIPITOR) 10 MG tablet, Take 1 tablet (10 mg total) by mouth daily., Disp: 90 tablet, Rfl: 3 .  cholecalciferol (VITAMIN D3) 25 MCG (1000 UT) tablet, Take 1,000 Units by mouth daily., Disp: , Rfl:  .  Fluticasone-Umeclidin-Vilant (TRELEGY ELLIPTA) 100-62.5-25 MCG/INH AEPB, Inhale 1 puff into the lungs daily., Disp: 60 each, Rfl: 5  Past Medical History: Past Medical History:  Diagnosis Date  . Breast cancer ([place]) 1999  . Cataract 2017   resolved with surgery  . COPD (chronic obstructive pulmonary disease) ([place])   . Coronary artery disease   . GERD (gastroesophageal reflux disease)   . Hyperlipidemia   . Postoperative atrial fibrillation ([place]) 03/2018  . Rheumatoid arthritis(714.0)   . Squamous cell carcinoma of lung, stage I, right ([place]) 2020   Status post right upper and middle lobectomy    Tobacco Use: Social History   Tobacco Use  Smoking Status Former Smoker  . Packs/day: 1.50  . Years: 50.00  . Pack years: 75.00  . Types: Cigarettes  . Quit date: 04/01/2011  . Years since quitting: 8.6  Smokeless Tobacco Never Used  Tobacco Comment   has stopped but has started again in "crisis"     Labs: Recent Review Flowsheet Data    Labs for ITP Cardiac and Pulmonary Rehab Latest Ref Rng & Units 04/10/2018 04/19/2018 04/23/2018 04/24/2018 12/11/2018   Cholestrol 0 - 200 mg/dL - - - - 218(H)   LDLCALC 0 - 99 mg/dL - - - - 127(H)   LDLDIRECT mg/dL - - - - -   HDL >39.00 mg/dL - - - - 57.90   Trlycerides 0 - 149 mg/dL - - - - 165.0(H)   Hemoglobin A1c 4.6 - 6.5 % - - - - 5.9   PHART 7.35 - 7.45 - TEST WILL BE CREDITED 7.425 7.354 -   PCO2ART 32 - 48 mmHg - TEST WILL BE CREDITED 41.1 48.4(H) -   HCO3 20.0 - 28.0 mmol/L 29.0(H) TEST WILL BE CREDITED 26.5 26.3 -   TCO2 22 - 32 mmol/L 31 - - - -   ACIDBASEDEF 0.0 - 2.0 mmol/L - TEST WILL BE CREDITED - - -   O2SAT % 76.0 TEST WILL BE CREDITED 97.7 98.6 -       Pulmonary Assessment Scores:  Pulmonary Assessment Scores    Row Name 08/13/19 1507         ADL UCSD   ADL Phase Entry     SOB Score total 57     Rest 1     Walk 3     Stairs 4     Bath 0  Dress 0     Shop 4       CAT Score   CAT Score 21       mMRC Score   mMRC Score 3            UCSD: Self-administered rating of dyspnea associated with activities of daily living (ADLs) 6-point scale (0 = "not at all" to 5 = "maximal or unable to do because of breathlessness")  Scoring Scores range from 0 to 120.  Minimally important difference is 5 units  CAT: CAT can identify the health impairment of COPD patients and is better correlated with disease progression.  CAT has a scoring range of zero to 40. The CAT score is classified into four groups of low (less than 10), medium (10 - 20), high (21-30) and very high (31-40) based on the impact level of disease on health status. A CAT score over 10 suggests significant symptoms.  A worsening CAT score could be explained by an exacerbation, poor medication adherence, poor inhaler technique, or progression of COPD or comorbid conditions.  CAT MCID is 2 points  mMRC: mMRC (Modified Medical Research Council) Dyspnea  Scale is used to assess the degree of baseline functional disability in patients of respiratory disease due to dyspnea. No minimal important difference is established. A decrease in score of 1 point or greater is considered a positive change.   Pulmonary Function Assessment:  Pulmonary Function Assessment - 08/07/19 1410      Breath   Shortness of Breath Yes;Limiting activity           Exercise Target Goals: Exercise Program Goal: Individual exercise prescription set using results from initial 6 min walk test and THRR while considering  patient's activity barriers and safety.   Exercise Prescription Goal: Initial exercise prescription builds to 30-45 minutes a day of aerobic activity, 2-3 days per week.  Home exercise guidelines will be given to patient during program as part of exercise prescription that the participant will acknowledge.  Education: Aerobic Exercise & Resistance Training: - Gives group verbal and written instruction on the various components of exercise. Focuses on aerobic and resistive training programs and the benefits of this training and how to safely progress through these programs..   Education: Exercise & Equipment Safety: - Individual verbal instruction and demonstration of equipment use and safety with use of the equipment.   Pulmonary Rehab from 11/21/2019 in [facility] Cardiac and Pulmonary Rehab  Date 08/13/19  Educator [facility]  Instruction Review Code 1- Verbalizes Understanding      Education: Exercise Physiology & General Exercise Guidelines: - Group verbal and written instruction with models to review the exercise physiology of the cardiovascular system and associated critical values. Provides general exercise guidelines with specific guidelines to those with heart or lung disease.    Education: Flexibility, Balance, Mind/Body Relaxation: Provides group verbal/written instruction on the benefits of flexibility and balance training, including mind/body  exercise modes such as yoga, pilates and tai chi.  Demonstration and skill practice provided.   Activity Barriers & Risk Stratification:  Activity Barriers & Cardiac Risk Stratification - 08/13/19 1501      Activity Barriers & Cardiac Risk Stratification   Activity Barriers Deconditioning;Joint Problems;Muscular Weakness;Shortness of Breath;Balance Concerns   r knee pain          6 Minute Walk:  6 Minute Walk    Row Name 08/13/19 1459         6 Minute Walk   Phase Initial  Distance 1035 feet     Walk Time 5 minutes     # of Rest Breaks 1  1 min     MPH 2.35     METS 2.32     RPE 13     Perceived Dyspnea  3     VO2 Peak 8.11     Symptoms Yes (comment)     Comments SOB, knee pain (5/10)     Resting HR 70 bpm     Resting BP 132/74     Resting Oxygen Saturation  98 %     Exercise Oxygen Saturation  during 6 min walk 94 %     Max Ex. HR 108 bpm     Max Ex. BP 204/70     2 Minute Post BP 160/80       Interval HR   1 Minute HR 94     2 Minute HR 108     3 Minute HR 102     4 Minute HR 102  seated     5 Minute HR 79     6 Minute HR 100     2 Minute Post HR 71     Interval Heart Rate? Yes       Interval Oxygen   Interval Oxygen? Yes     Baseline Oxygen Saturation % 98 %     1 Minute Oxygen Saturation % 97 %     1 Minute Liters of Oxygen 0 L  Room Air     2 Minute Oxygen Saturation % 94 %     2 Minute Liters of Oxygen 0 L     3 Minute Oxygen Saturation % 96 %     3 Minute Liters of Oxygen 0 L     4 Minute Oxygen Saturation % 95 %     4 Minute Liters of Oxygen 0 L     5 Minute Oxygen Saturation % 97 %     5 Minute Liters of Oxygen 0 L     6 Minute Oxygen Saturation % 96 %     6 Minute Liters of Oxygen 0 L     2 Minute Post Oxygen Saturation % 98 %     2 Minute Post Liters of Oxygen 0 L           Oxygen Initial Assessment:  Oxygen Initial Assessment - 08/07/19 1409      Home Oxygen   Home Oxygen Device None    Sleep Oxygen Prescription None    Home  Exercise Oxygen Prescription None    Home Resting Oxygen Prescription None    Compliance with Home Oxygen Use Yes      Initial 6 min Walk   Oxygen Used None      Program Oxygen Prescription   Program Oxygen Prescription None      Intervention   Short Term Goals To learn and exhibit compliance with exercise, home and travel O2 prescription;To learn and understand importance of monitoring SPO2 with pulse oximeter and demonstrate accurate use of the pulse oximeter.;To learn and understand importance of maintaining oxygen saturations>88%;To learn and demonstrate proper pursed lip breathing techniques or other breathing techniques.;To learn and demonstrate proper use of respiratory medications    Long  Term Goals Exhibits compliance with exercise, home and travel O2 prescription;Verbalizes importance of monitoring SPO2 with pulse oximeter and return demonstration;Maintenance of O2 saturations>88%;Exhibits proper breathing techniques, such as pursed lip breathing or other method taught during program session;Compliance with  respiratory medication;Demonstrates proper use of MDI's           Oxygen Re-Evaluation:  Oxygen Re-Evaluation    Row Name 08/15/19 1121 09/03/19 1156 09/24/19 1150 11/21/19 1124       Program Oxygen Prescription   Program Oxygen Prescription None None None None      Home Oxygen   Home Oxygen Device None None None None    Sleep Oxygen Prescription None None None None    Home Exercise Oxygen Prescription None None None None    Home Resting Oxygen Prescription None None None None    Compliance with Home Oxygen Use Yes Yes Yes Yes      Goals/Expected Outcomes   Short Term Goals To learn and exhibit compliance with exercise, home and travel O2 prescription;To learn and understand importance of monitoring SPO2 with pulse oximeter and demonstrate accurate use of the pulse oximeter.;To learn and understand importance of maintaining oxygen saturations>88%;To learn and  demonstrate proper pursed lip breathing techniques or other breathing techniques.;To learn and demonstrate proper use of respiratory medications To learn and exhibit compliance with exercise, home and travel O2 prescription;To learn and understand importance of monitoring SPO2 with pulse oximeter and demonstrate accurate use of the pulse oximeter.;To learn and understand importance of maintaining oxygen saturations>88%;To learn and demonstrate proper pursed lip breathing techniques or other breathing techniques.;To learn and demonstrate proper use of respiratory medications To learn and exhibit compliance with exercise, home and travel O2 prescription;To learn and understand importance of monitoring SPO2 with pulse oximeter and demonstrate accurate use of the pulse oximeter.;To learn and understand importance of maintaining oxygen saturations>88%;To learn and demonstrate proper pursed lip breathing techniques or other breathing techniques.;To learn and demonstrate proper use of respiratory medications To learn and exhibit compliance with exercise, home and travel O2 prescription;To learn and understand importance of monitoring SPO2 with pulse oximeter and demonstrate accurate use of the pulse oximeter.;To learn and understand importance of maintaining oxygen saturations>88%;To learn and demonstrate proper pursed lip breathing techniques or other breathing techniques.;To learn and demonstrate proper use of respiratory medications    Long  Term Goals Exhibits compliance with exercise, home and travel O2 prescription;Verbalizes importance of monitoring SPO2 with pulse oximeter and return demonstration;Maintenance of O2 saturations>88%;Exhibits proper breathing techniques, such as pursed lip breathing or other method taught during program session;Compliance with respiratory medication;Demonstrates proper use of MDI's Exhibits compliance with exercise, home and travel O2 prescription;Verbalizes importance of  monitoring SPO2 with pulse oximeter and return demonstration;Maintenance of O2 saturations>88%;Exhibits proper breathing techniques, such as pursed lip breathing or other method taught during program session;Compliance with respiratory medication;Demonstrates proper use of MDI's Exhibits compliance with exercise, home and travel O2 prescription;Verbalizes importance of monitoring SPO2 with pulse oximeter and return demonstration;Maintenance of O2 saturations>88%;Exhibits proper breathing techniques, such as pursed lip breathing or other method taught during program session;Compliance with respiratory medication;Demonstrates proper use of MDI's Exhibits compliance with exercise, home and travel O2 prescription;Verbalizes importance of monitoring SPO2 with pulse oximeter and return demonstration;Maintenance of O2 saturations>88%;Exhibits proper breathing techniques, such as pursed lip breathing or other method taught during program session;Compliance with respiratory medication;Demonstrates proper use of MDI's    Comments Reviewed PLB technique with pt.  Talked about how it works and it's importance in maintaining their exercise saturations. Hannah Green is using her PLB and finds it helpful. She does not have a pulse oximeter so we talked about getting one.  She will look into it. Hannah Green plans to add a day of exercise  in addition to LW class.  Staff reviewed importance of checking O2 during exercise. Staff reviewed importance of checking O2 during exercise, encouraged to take back out her pulse ox. Practices PLB and reports getting better with it.    Goals/Expected Outcomes Short: Become more profiecient at using PLB.   Long: Become independent at using PLB. Short: Get pulse oximeter.  Long: Continue to work on PLB. Short: monitor O2 when exercising at home Long: use PLB as needed Short: monitor O2 when exercising at home Long: use PLB as needed           Oxygen Discharge (Final Oxygen Re-Evaluation):  Oxygen  Re-Evaluation - 11/21/19 1124      Program Oxygen Prescription   Program Oxygen Prescription None      Home Oxygen   Home Oxygen Device None    Sleep Oxygen Prescription None    Home Exercise Oxygen Prescription None    Home Resting Oxygen Prescription None    Compliance with Home Oxygen Use Yes      Goals/Expected Outcomes   Short Term Goals To learn and exhibit compliance with exercise, home and travel O2 prescription;To learn and understand importance of monitoring SPO2 with pulse oximeter and demonstrate accurate use of the pulse oximeter.;To learn and understand importance of maintaining oxygen saturations>88%;To learn and demonstrate proper pursed lip breathing techniques or other breathing techniques.;To learn and demonstrate proper use of respiratory medications    Long  Term Goals Exhibits compliance with exercise, home and travel O2 prescription;Verbalizes importance of monitoring SPO2 with pulse oximeter and return demonstration;Maintenance of O2 saturations>88%;Exhibits proper breathing techniques, such as pursed lip breathing or other method taught during program session;Compliance with respiratory medication;Demonstrates proper use of MDI's    Comments Staff reviewed importance of checking O2 during exercise, encouraged to take back out her pulse ox. Practices PLB and reports getting better with it.    Goals/Expected Outcomes Short: monitor O2 when exercising at home Long: use PLB as needed           Initial Exercise Prescription:  Initial Exercise Prescription - 08/13/19 1500      Date of Initial Exercise RX and Referring Provider   Date 08/13/19    Referring Provider [name] DO      Treadmill   MPH 1.7    Grade 0.5    Minutes 15    METs 2.42      NuStep   Level 1    SPM 80    Minutes 15    METs 2      REL-XR   Level 1    Speed 50    Minutes 15    METs 2      Prescription Details   Frequency (times per week) 2    Duration Progress to 30 minutes  of continuous aerobic without signs/symptoms of physical distress      Intensity   THRR 40-80% of Max Heartrate 100-131    Ratings of Perceived Exertion 11-13    Perceived Dyspnea 0-4      Progression   Progression Continue to progress workloads to maintain intensity without signs/symptoms of physical distress.      Resistance Training   Training Prescription Yes    Weight 3 lb    Reps 10-15           Perform Capillary Blood Glucose checks as needed.  Exercise Prescription Changes:  Exercise Prescription Changes    Row Name 08/13/19 1500 08/27/19 1000 09/10/19 1300 09/24/19  1300 10/08/19 1300     Response to Exercise   Blood Pressure (Admit) 132/74 130/60 146/70 114/66 130/60   Blood Pressure (Exercise) 204/70  rck 168/80 136/70 148/68 142/68 164/72   Blood Pressure (Exit) 126/70 132/62 122/60 110/60 122/54   Heart Rate (Admit) 70 bpm 71 bpm 69 bpm 65 bpm 73 bpm   Heart Rate (Exercise) 108 bpm 108 bpm 97 bpm 87 bpm 103 bpm   Heart Rate (Exit) 78 bpm 80 bpm 80 bpm 79 bpm 77 bpm   Oxygen Saturation (Admit) 98 % 95 % 96 % 96 % 96 %   Oxygen Saturation (Exercise) 94 % 94 % 94 % 94 % 94 %   Oxygen Saturation (Exit) 94 % 97 % 98 % 93 % 96 %   Rating of Perceived Exertion (Exercise) '13 11 13 12 12   ' Perceived Dyspnea (Exercise) '3 3 2 2 2   ' Symptoms SOB, R knee pain 5/10 none none none none   Comments walk test results second full day of exercise -- -- --   Duration -- Progress to 30 minutes of  aerobic without signs/symptoms of physical distress Continue with 30 min of aerobic exercise without signs/symptoms of physical distress. Continue with 30 min of aerobic exercise without signs/symptoms of physical distress. Continue with 30 min of aerobic exercise without signs/symptoms of physical distress.   Intensity -- THRR unchanged THRR unchanged THRR unchanged THRR unchanged     Progression   Progression -- Continue to progress workloads to maintain intensity without signs/symptoms  of physical distress. Continue to progress workloads to maintain intensity without signs/symptoms of physical distress. Continue to progress workloads to maintain intensity without signs/symptoms of physical distress. Continue to progress workloads to maintain intensity without signs/symptoms of physical distress.   Average METs -- 2.5 2.5 2.75 2.5     Resistance Training   Training Prescription -- Yes Yes Yes Yes   Weight -- 3 lb 3 lb 3 lb 3 lb   Reps -- 10-15 10-15 10-15 10-15     Interval Training   Interval Training -- No No No No     Treadmill   MPH -- 1.7 1.9 1.9 1.8   Grade -- 0.5 0.5 0.5 0.5   Minutes -- '15 15 15 15   ' METs -- 2.42 2.6 2.59 2.59     NuStep   Level -- '4 4 4 3   ' SPM -- -- 80 -- 80   Minutes -- '15 15 15 15   ' METs -- 2.5 2.4 2.9 2.4     REL-XR   Level -- 1 -- 1 --   Minutes -- 15 -- 15 --     Home Exercise Plan   Plans to continue exercise at -- -- -- Home (comment)  walking, also considering joining [employer] (comment)  walking, also considering joining [employer] -- -- -- Add 2 additional days to program exercise sessions. Add 2 additional days to program exercise sessions.   Initial Home Exercises Provided -- -- -- 09/24/19 09/24/19   Row Name 10/22/19 1300 11/05/19 1300 11/19/19 1400 12/02/19 1000       Response to Exercise   Blood Pressure (Admit) 128/64 122/66 126/62 132/60    Blood Pressure (Exercise) 150/62 146/50 154/60 160/80    Blood Pressure (Exit) 112/64 136/56 122/70 106/60    Heart Rate (Admit) 74 bpm 72 bpm 70 bpm 73 bpm    Heart Rate (Exercise) 106 bpm 107 bpm 89  bpm 108 bpm    Heart Rate (Exit) 80 bpm 84 bpm 80 bpm 78 bpm    Oxygen Saturation (Admit) 96 % 96 % 94 % 98 %    Oxygen Saturation (Exercise) 94 % 95 % 96 % 96 %    Oxygen Saturation (Exit) 97 % 98 % 97 % 95 %    Rating of Perceived Exertion (Exercise) '13 13 13 13    ' Perceived Dyspnea (Exercise) '3 1 3 3    ' Symptoms none none none --    Duration Continue  with 30 min of aerobic exercise without signs/symptoms of physical distress. Continue with 30 min of aerobic exercise without signs/symptoms of physical distress. Continue with 30 min of aerobic exercise without signs/symptoms of physical distress. Continue with 30 min of aerobic exercise without signs/symptoms of physical distress.    Intensity THRR unchanged THRR unchanged THRR unchanged THRR unchanged      Progression   Progression Continue to progress workloads to maintain intensity without signs/symptoms of physical distress. Continue to progress workloads to maintain intensity without signs/symptoms of physical distress. Continue to progress workloads to maintain intensity without signs/symptoms of physical distress. Continue to progress workloads to maintain intensity without signs/symptoms of physical distress.    Average METs 2.96 2.4 2.89 3.2      Resistance Training   Training Prescription Yes Yes Yes Yes    Weight 3 lb 3 lb 3 lb 3 lb    Reps 10-15 10-15 10-15 10-15      Interval Training   Interval Training No No No No      Treadmill   MPH 1.'9 2 2 2    ' Grade 0.5 0.5 0.5 0.5    Minutes '15 15 15 15    ' METs 2.59 2.67 2.67 2.67      NuStep   Level '4 4 4 4    ' SPM -- 80 -- 80    Minutes '15 15 15 15    ' METs 3.3 2.1 3 3.7      Biostep-RELP   Level 1 -- 1 --    Minutes 15 -- 15 --    METs 3 -- 3 --      Home Exercise Plan   Plans to continue exercise at Home (comment)  walking, also considering joining [employer] -- Home (comment)  walking, also considering joining [employer] --    Frequency Add 2 additional days to program exercise sessions. -- Add 2 additional days to program exercise sessions. --    Initial Home Exercises Provided 09/24/19 -- 09/24/19 --           Exercise Comments:   Exercise Goals and Review:  Exercise Goals    Row Name 08/13/19 1505             Exercise Goals   Increase Physical Activity Yes       Intervention Provide advice, education,  support and counseling about physical activity/exercise needs.;Develop an individualized exercise prescription for aerobic and resistive training based on initial evaluation findings, risk stratification, comorbidities and participant's personal goals.       Expected Outcomes Short Term: Attend rehab on a regular basis to increase amount of physical activity.;Long Term: Add in home exercise to make exercise part of routine and to increase amount of physical activity.;Long Term: Exercising regularly at least 3-5 days a week.       Increase Strength and Stamina Yes       Intervention Provide advice, education, support and  counseling about physical activity/exercise needs.;Develop an individualized exercise prescription for aerobic and resistive training based on initial evaluation findings, risk stratification, comorbidities and participant's personal goals.       Expected Outcomes Short Term: Increase workloads from initial exercise prescription for resistance, speed, and METs.;Short Term: Perform resistance training exercises routinely during rehab and add in resistance training at home;Long Term: Improve cardiorespiratory fitness, muscular endurance and strength as measured by increased METs and functional capacity (6MWT)       Able to understand and use rate of perceived exertion (RPE) scale Yes       Intervention Provide education and explanation on how to use RPE scale       Expected Outcomes Short Term: Able to use RPE daily in rehab to express subjective intensity level;Long Term:  Able to use RPE to guide intensity level when exercising independently       Able to understand and use Dyspnea scale Yes       Intervention Provide education and explanation on how to use Dyspnea scale       Expected Outcomes Short Term: Able to use Dyspnea scale daily in rehab to express subjective sense of shortness of breath during exertion;Long Term: Able to use Dyspnea scale to guide intensity level when exercising  independently       Knowledge and understanding of Target Heart Rate Range (THRR) Yes       Intervention Provide education and explanation of THRR including how the numbers were predicted and where they are located for reference       Expected Outcomes Short Term: Able to state/look up THRR;Short Term: Able to use daily as guideline for intensity in rehab;Long Term: Able to use THRR to govern intensity when exercising independently       Able to check pulse independently Yes       Intervention Review the importance of being able to check your own pulse for safety during independent exercise;Provide education and demonstration on how to check pulse in carotid and radial arteries.       Expected Outcomes Short Term: Able to explain why pulse checking is important during independent exercise;Long Term: Able to check pulse independently and accurately       Understanding of Exercise Prescription Yes       Intervention Provide education, explanation, and written materials on patient's individual exercise prescription       Expected Outcomes Short Term: Able to explain program exercise prescription;Long Term: Able to explain home exercise prescription to exercise independently              Exercise Goals Re-Evaluation :  Exercise Goals Re-Evaluation    Row Name 08/15/19 1121 08/27/19 1054 09/03/19 1142 09/10/19 1305 09/24/19 1145     Exercise Goal Re-Evaluation   Exercise Goals Review Increase Physical Activity;Able to understand and use rate of perceived exertion (RPE) scale;Knowledge and understanding of Target Heart Rate Range (THRR);Understanding of Exercise Prescription;Increase Strength and Stamina;Able to understand and use Dyspnea scale;Able to check pulse independently Increase Physical Activity;Understanding of Exercise Prescription;Increase Strength and Stamina Increase Physical Activity;Understanding of Exercise Prescription;Increase Strength and Stamina Increase Physical  Activity;Understanding of Exercise Prescription;Increase Strength and Stamina Increase Physical Activity;Understanding of Exercise Prescription;Increase Strength and Stamina   Comments Reviewed RPE and dyspnea scales, THR and program prescription with pt today.  Pt voiced understanding and was given a copy of goals to take home. Hannah Green is off to a good start in rehab.  She has completed her first two  full days of exercise She is already up to level 4 on the NuStep.  We will continue to monitor her progress. Hannah Green is doing well in rehab.  She is not a fan on the XR. We will look at moving her around.  She is planning to walk and swim while out on vacation next week. Hannah Green has increased her speed on TM.  Staff will follow up with her when she returns from vacation. Reviewed home exercise with pt today.  Pt plans to walk/staff videos for exercise.  Reviewed THR, pulse, RPE, sign and symptoms, pulse oximetery and when to call 911 or MD.  Also discussed weather considerations and indoor options.  Pt voiced understanding.   Expected Outcomes Short: Use RPE daily to regulate intensity. Long: Follow program prescription in THR. Short:Continue to attend regularly Long: Continue to follow program prescription Short: Walk and swimmon vacation Long; Continue to improve stamina. Short: stay active on vacation Long: increase MET level Short: add one day of exercise in addition to program sessions Long:  increase stamina   Row Name 09/24/19 1335 10/08/19 1340 10/22/19 1349 11/05/19 1121 11/19/19 1452     Exercise Goal Re-Evaluation   Exercise Goals Review Increase Physical Activity;Understanding of Exercise Prescription;Increase Strength and Stamina Increase Physical Activity;Understanding of Exercise Prescription;Increase Strength and Stamina Increase Physical Activity;Understanding of Exercise Prescription;Increase Strength and Stamina Increase Physical Activity;Understanding of Exercise Prescription;Increase Strength and  Stamina Increase Physical Activity;Understanding of Exercise Prescription;Increase Strength and Stamina   Comments Hannah Green is doing well in rehab.  She is now up to level 4 onte NuStep.  We will continue to monitor her progress. Hannah Green works at [employer] and reaches THR range during some of her sessions.  Staff will encourage increasing workloads slightly to maintain THR during session. Hannah Green has been doing well in rehab. She is now up to level 4 on the NuSep.  She is still on level 1 for BioStep, so we will encourage her to increase that workload.  We will continue to monitor her progress. Treadmill - 20 minutes 1x/week - 26mh. Discussed adding in weighted exercises and 150 minutes of cardio exercise. Hannah Green to have problems attending rehab consistently.  She was out all last week and called out again today.  Progression would be improved with better attendance.   Expected Outcomes Short: Start to slowly bring up workloads  Long: Continue to improve stamina. Short:  increase workloads Long: improve MET level Short: Increase workload on BioStep. Long: Continue to improve stamina. Short: Increase home exercise Long: Continue to improve stamina. Short: Improved attendance for rehab  Long: Continue to increase activity levels   Row Name 11/21/19 1127 12/02/19 1032           Exercise Goal Re-Evaluation   Exercise Goals Review Increase Physical Activity;Understanding of Exercise Prescription;Increase Strength and Stamina Increase Physical Activity;Understanding of Exercise Prescription;Increase Strength and Stamina      Comments SFranklincontinues to have problems attending rehab consistently. Progression would be improved with better attendance. STasheais exercising at home - treadmill and resistance learned during rehab - walks on the treadmill 20-30 minutes 2x/week, resistance every day. SKhylihas increased MET level.  Staff will review correct THR/oxygen range for her exercise at home.      Expected  Outcomes Short: Improve attendance for rehab, continue to exercise at home, consistently use treadmill for 30 minutes Long: Continue to increase activity levels Short: continue exercise at home, attend consistently Long: increase overall stamina  Discharge Exercise Prescription (Final Exercise Prescription Changes):  Exercise Prescription Changes - 12/02/19 1000      Response to Exercise   Blood Pressure (Admit) 132/60    Blood Pressure (Exercise) 160/80    Blood Pressure (Exit) 106/60    Heart Rate (Admit) 73 bpm    Heart Rate (Exercise) 108 bpm    Heart Rate (Exit) 78 bpm    Oxygen Saturation (Admit) 98 %    Oxygen Saturation (Exercise) 96 %    Oxygen Saturation (Exit) 95 %    Rating of Perceived Exertion (Exercise) 13    Perceived Dyspnea (Exercise) 3    Duration Continue with 30 min of aerobic exercise without signs/symptoms of physical distress.    Intensity THRR unchanged      Progression   Progression Continue to progress workloads to maintain intensity without signs/symptoms of physical distress.    Average METs 3.2      Resistance Training   Training Prescription Yes    Weight 3 lb    Reps 10-15      Interval Training   Interval Training No      Treadmill   MPH 2    Grade 0.5    Minutes 15    METs 2.67      NuStep   Level 4    SPM 80    Minutes 15    METs 3.7           Nutrition:  Target Goals: Understanding of nutrition guidelines, daily intake of sodium <1520m, cholesterol <208m calories 30% from fat and 7% or less from saturated fats, daily to have 5 or more servings of fruits and vegetables.  Education: Controlling Sodium/Reading Food Labels -Group verbal and written material supporting the discussion of sodium use in heart healthy nutrition. Review and explanation with models, verbal and written materials for utilization of the food label.   Education: General Nutrition Guidelines/Fats and Fiber: -Group instruction provided by  verbal, written material, models and posters to present the general guidelines for heart healthy nutrition. Gives an explanation and review of dietary fats and fiber.   [facility]  Date 11/07/19  [facility] Code 1- Verbalizes Understanding      Biometrics:  Pre Biometrics - 08/13/19 1505      Pre Biometrics   Height 5' 6.75" (1.695 m)    Weight 225 lb 12.8 oz (102.4 kg)    BMI (Calculated) 35.65    Single Leg Stand 10.94 seconds            Nutrition Therapy Plan and Nutrition Goals:  Nutrition Therapy & Goals - 10/03/19 1130      Nutrition Therapy   Diet Pulmonary MNT, heart healthy eating    Drug/Food Interactions Statins/Certain Fruits    Protein (specify units) 80g    Fiber 25 grams    Whole Grain Foods 3 servings    Saturated Fats 12 max. grams    Fruits and Vegetables 5 servings/day    Sodium 1.5 grams      Personal Nutrition Goals   Nutrition Goal ST: add granola to ice cream, eat sweet potatos as a snack, add protein source to snack or breakfast LT: improve breathing (now 2/10)    Comments B: tries to eat when she gets up. An egg (soft boiled) toast or or english muffin (whole wheat) - butter. Coffee or hot tea (sugar free french vanilla creamer). S: L: tuna (mustard  and green olives) with crackers (wheat thins (reduced fat)). Diet green tea. D: salmon, no red meat, some portk, mostly chicken or fish (baked). asparagus, lots of salad with other nonstarchy vegetables, baked vegetables (olive oil) S: loves to bake, ice cream Drinks: diet green tea and water Discussed heart healthy eaitng, pulmonary MNT, discussed adding one more good source of protein. Pt reports loving sweet potatos and would like to have granola on her ice cream (will find a healthy version)      Intervention Plan   Intervention Prescribe, educate and counsel regarding individualized specific dietary modifications aiming  towards targeted core components such as weight, hypertension, lipid management, diabetes, heart failure and other comorbidities.;Nutrition handout(s) given to patient.    Expected Outcomes Short Term Goal: Understand basic principles of dietary content, such as calories, fat, sodium, cholesterol and nutrients.;Short Term Goal: A plan has been developed with personal nutrition goals set during dietitian appointment.;Long Term Goal: Adherence to prescribed nutrition plan.           Nutrition Assessments:  Nutrition Assessments - 08/13/19 1506      MEDFICTS Scores   Pre Score 49           MEDIFICTS Score Key:          ?70 Need to make dietary changes          40-70 Heart Healthy Diet         ? 40 Therapeutic Level Cholesterol Diet  Nutrition Goals Re-Evaluation:  Nutrition Goals Re-Evaluation    [place] Name 09/03/19 1157 11/05/19 1135 11/21/19 1115         Goals   Nutrition Goal Meet with dietician ST: continue current changes LT: improve breathing (now 3/10) ST: continue current changes, reduce sugar LT: improve breathing (now 3/10)     Comment Hannah Green has already to started to make some healthier changes in her diet and seeing the difference in her weight and how she is feeling. She tries new recipes and has a heart healthy diet. continue current chnages. Hannah Green reports doing well with her nutrition and is still trying to cut back on sugar, discussed restirciton and cravings - she still wants to quit cold [place]. Reports still eating well and would like to continue with her current changes.     Expected Outcome Short: Meet with dietcian Long: Continue to work on healthy changes. ST: continue current changes LT: improve breathing (now 3/10) ST: continue current changes, reduce sugar LT: improve breathing (now 4/10)            Nutrition Goals Discharge (Final Nutrition Goals Re-Evaluation):  Nutrition Goals Re-Evaluation - 11/21/19 1115      Goals   Nutrition Goal ST: continue  current changes, reduce sugar LT: improve breathing (now 3/10)    Comment Hannah Green reports doing well with her nutrition and is still trying to cut back on sugar, discussed restirciton and cravings - she still wants to quit cold [place]. Reports still eating well and would like to continue with her current changes.    Expected Outcome ST: continue current changes, reduce sugar LT: improve breathing (now 4/10)           Psychosocial: Target Goals: Acknowledge presence or absence of significant depression and/or stress, maximize coping skills, provide positive support system. Participant is able to verbalize types and ability to use techniques and skills needed for reducing stress and depression.   Education: Depression - Provides group verbal and written instruction on the correlation between heart/lung  disease and depressed mood, treatment options, and the stigmas associated with seeking treatment.   Education: Sleep Hygiene -Provides group verbal and written instruction about how sleep can affect your health.  Define sleep hygiene, discuss sleep cycles and impact of sleep habits. Review good sleep hygiene tips.    Pulmonary Rehab from 11/21/2019 in [facility] Cardiac and Pulmonary Rehab  Date 09/05/19  Educator [facility]  Instruction Review Code 1- Verbalizes Understanding      Education: Stress and Anxiety: - Provides group verbal and written instruction about the health risks of elevated stress and causes of high stress.  Discuss the correlation between heart/lung disease and anxiety and treatment options. Review healthy ways to manage with stress and anxiety.   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      Family Dynamics   Good Support System? Yes    Comments She can look to her son for support. Patient is not able to sleep well.      Barriers   Psychosocial barriers to participate in program The patient should benefit from training in stress management and relaxation.      Screening Interventions   Interventions Encouraged to exercise;To provide support and resources with identified psychosocial needs;Provide feedback about the scores to participant    Expected Outcomes Short Term goal: Utilizing psychosocial counselor, staff and physician to assist with identification of specific Stressors or current issues interfering with healing process. Setting desired goal for each stressor or current issue identified.;Long Term Goal: Stressors or current issues are controlled or eliminated.;Short Term goal: Identification and review with participant of any Quality of Life or Depression concerns found by scoring the questionnaire.;Long Term goal: The participant improves quality of Life and PHQ9 Scores as seen by post scores and/or verbalization of changes           Quality of Life Scores:  Scores of 19 and below usually indicate a poorer quality of life in these areas.  A difference of  2-3 points is a clinically meaningful difference.  A difference of 2-3 points in the total score of the Quality of Life Index has been associated with significant improvement in overall quality of life, self-image, physical symptoms, and general health in studies assessing change in quality of life.  PHQ-9: Recent Review Flowsheet Data    Depression screen [facility] 2/9 08/13/2019 12/11/2018 12/05/2017 11/24/2016 11/24/2015   Decreased Interest 0 0 0 1 0   Down, Depressed, Hopeless 0 0 1 0 0   PHQ - 2 Score 0 0 1 1 0   Altered sleeping 1 0 2 0 -   Tired, decreased energy 2 0 1 2 -   Change in appetite 2 0 2 2 -   Feeling bad or failure about yourself  0 0 1 1 -   Trouble  concentrating 0 0 0 0 -   Moving slowly or fidgety/restless 0 0 0 0 -   Suicidal thoughts 0 0 0 0 -   PHQ-9 Score 5 0 7 6 -   Difficult doing work/chores Somewhat difficult Not difficult at all Not difficult at all Not difficult at all -  Interpretation of Total Score  Total Score Depression Severity:  1-4 = Minimal depression, 5-9 = Mild depression, 10-14 = Moderate depression, 15-19 = Moderately severe depression, 20-27 = Severe depression   Psychosocial Evaluation and Intervention:  Psychosocial Evaluation - 08/07/19 1405      Psychosocial Evaluation & Interventions   Interventions Encouraged to exercise with the program and follow exercise prescription    Comments 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 She can look to her son for support. Patient is not able to sleep well.    Expected Outcomes Short: Continue to exercise regularly to support mental health and notify staff of any changes. Long: maintain mental health and well being through teaching of rehab or prescribed medications independently.    Continue Psychosocial Services  Follow up required by staff           Psychosocial Re-Evaluation:  Psychosocial Re-Evaluation    [place] Name 09/03/19 1146 09/24/19 1138 11/05/19 1125 11/21/19 1122       Psychosocial Re-Evaluation   Current issues with Current Stress Concerns;Current Sleep Concerns -- Current Stress Concerns;Current Sleep Concerns Current Sleep Concerns    Comments Hannah Green is doing well in rehab.  She is going to the beach for the week next week to [facility]. She is looking forward to her trip to just get away for a few days.  She continues to have trouble sleeping, so I sent her information to review. Hannah Green still doesnt sleep well.  She has trouble going back to sleep if she gets up.  Other than sleep, she feels good mentally. Her family gives her stress and her plumbing has been backed up which is causing her stress. Hannah Green is still has  trouble sleeping and thinks she may need a new mattress in addition to stress. Discussed sleep hygiene. Hannah Green rpeorts her stress is better, but reports her sleep is poor. She reports having to wake up to go to the bathroom.    Expected Outcomes Short: Review sleep info and enjoy vacation Long; Continue to stay positive. Short: continue to work on sleeping better Long: maintain positive outlook Short: continue to work on sleeping better Long: maintain positive outlook Short: continue to work on sleeping better, ask PCP about pelvic floor therapy to help with frequent urination Long: maintain positive outlook    Interventions Encouraged to attend Pulmonary Rehabilitation for the exercise;Stress management education -- Encouraged to attend Pulmonary Rehabilitation for the exercise;Stress management education Encouraged to attend Pulmonary Rehabilitation for the exercise    Continue Psychosocial Services  Follow up required by staff -- Follow up required by staff Follow up required by staff    Comments -- -- working on stress and sleep management sleep management      Initial Review   Source of Stress Concerns -- -- Chronic Illness;Unable to participate in former interests or hobbies Chronic Illness;Unable to participate in former interests or hobbies           Psychosocial Discharge (Final Psychosocial Re-Evaluation):  Psychosocial Re-Evaluation - 11/21/19 1122      Psychosocial Re-Evaluation   Current issues with Current Sleep Concerns    Comments Hannah Green rpeorts her stress is better, but reports her sleep is poor. She reports having to wake up to go to the bathroom.    Expected Outcomes Short: continue to work on sleeping better, ask PCP about pelvic floor therapy to help with frequent urination Long: maintain positive outlook    Interventions  Encouraged to attend Pulmonary Rehabilitation for the exercise    Continue Psychosocial Services  Follow up required by staff    Comments sleep management       Initial Review   Source of Stress Concerns Chronic Illness;Unable to participate in former interests or hobbies           Education: Education Goals: Education classes will be provided on a weekly basis, covering required topics. Participant will state understanding/return demonstration of topics presented.  Learning Barriers/Preferences:  Learning Barriers/Preferences - 08/07/19 1403      Learning Barriers/Preferences   Learning Barriers None    Learning Preferences None           General Pulmonary Education Topics:  Infection Prevention: - Provides verbal and written material to individual with discussion of infection control including proper hand washing and proper equipment cleaning during exercise session.   Pulmonary Rehab from 11/21/2019 in [facility] Cardiac and Pulmonary Rehab  Date 08/13/19  Educator [facility]  Instruction Review Code 1- Verbalizes Understanding      Falls Prevention: - Provides verbal and written material to individual with discussion of falls prevention and safety.   Pulmonary Rehab from 11/21/2019 in [facility] Cardiac and Pulmonary Rehab  Date 08/13/19  Educator [facility]  Instruction Review Code 1- Verbalizes Understanding      Chronic Lung Diseases: - Group verbal and written instruction to review updates, respiratory medications, advancements in procedures and treatments. Discuss use of supplemental oxygen including available portable oxygen systems, continuous and intermittent flow rates, concentrators, personal use and safety guidelines. Review proper use of inhaler and spacers. Provide informative websites for self-education.    Pulmonary Rehab from 11/21/2019 in [facility] Cardiac and Pulmonary Rehab  Date 09/19/19  Educator jh  Instruction Review Code 1- Verbalizes Understanding      Energy Conservation: - Provide group verbal and written instruction for methods to conserve energy, plan and organize activities. Instruct on pacing techniques, use of  adaptive equipment and posture/positioning to relieve shortness of breath.   Triggers and Exacerbations: - Group verbal and written instruction to review types of environmental triggers and ways to prevent exacerbations. Discuss weather changes, air quality and the benefits of nasal washing. Review warning signs and symptoms to help prevent infections. Discuss techniques for effective airway clearance, coughing, and vibrations.   AED/CPR: - Group verbal and written instruction with the use of models to demonstrate the basic use of the AED with the basic ABC's of resuscitation.   Anatomy and Physiology of the Lungs: - Group verbal and written instruction with the use of models to provide basic lung anatomy and physiology related to function, structure and complications of lung disease.   Anatomy & Physiology of the Heart: - Group verbal and written instruction and models provide basic cardiac anatomy and physiology, with the coronary electrical and arterial systems. Review of Valvular disease and Heart Failure   Cardiac Medications: - Group verbal and written instruction to review commonly prescribed medications for heart disease. Reviews the medication, class of the drug, and side effects.   Pulmonary Rehab from 11/21/2019 in [facility] Cardiac and Pulmonary Rehab  Date 09/26/19  Educator SB  Instruction Review Code 1- Verbalizes Understanding      Other: -Provides group and verbal instruction on various topics (see comments)   Knowledge Questionnaire Score:  Knowledge Questionnaire Score - 08/13/19 1506      Knowledge Questionnaire Score   Pre Score 11/18 Education Focus; Pulm A&P, O2 safety  Core Components/Risk Factors/Patient Goals at Admission:  Personal Goals and Risk Factors at Admission - 08/13/19 1506      Core Components/Risk Factors/Patient Goals on Admission    Weight Management Yes;Weight Loss;Obesity    Intervention Weight Management: Develop a combined  nutrition and exercise program designed to reach desired caloric intake, while maintaining appropriate intake of nutrient and fiber, sodium and fats, and appropriate energy expenditure required for the weight goal.;Weight Management: Provide education and appropriate resources to help participant work on and attain dietary goals.;Weight Management/Obesity: Establish reasonable short term and long term weight goals.;Obesity: Provide education and appropriate resources to help participant work on and attain dietary goals.    Admit Weight 225 lb 12.8 oz (102.4 kg)    Goal Weight: Short Term 220 lb (99.8 kg)    Goal Weight: Long Term 215 lb (97.5 kg)    Expected Outcomes Long Term: Adherence to nutrition and physical activity/exercise program aimed toward attainment of established weight goal;Short Term: Continue to assess and modify interventions until short term weight is achieved;Weight Loss: Understanding of general recommendations for a balanced deficit meal plan, which promotes 1-2 lb weight loss per week and includes a negative energy balance of [phone] kcal/d;Understanding recommendations for meals to include 15-35% energy as protein, 25-35% energy from fat, 35-60% energy from carbohydrates, less than 215m of dietary cholesterol, 20-35 gm of total fiber daily;Understanding of distribution of calorie intake throughout the day with the consumption of 4-5 meals/snacks    Improve shortness of breath with ADL's Yes    Intervention Provide education, individualized exercise plan and daily activity instruction to help decrease symptoms of SOB with activities of daily living.    Expected Outcomes Short Term: Improve cardiorespiratory fitness to achieve a reduction of symptoms when performing ADLs;Long Term: Be able to perform more ADLs without symptoms or delay the onset of symptoms    Lipids Yes    Intervention Provide education and support for participant on nutrition & aerobic/resistive exercise along with  prescribed medications to achieve LDL <764m HDL >4026m   Expected Outcomes Short Term: Participant states understanding of desired cholesterol values and is compliant with medications prescribed. Participant is following exercise prescription and nutrition guidelines.;Long Term: Cholesterol controlled with medications as prescribed, with individualized exercise RX and with personalized nutrition plan. Value goals: LDL < 21m1mDL > 40 mg.           Education:Diabetes - Individual verbal and written instruction to review signs/symptoms of diabetes, desired ranges of glucose level fasting, after meals and with exercise. Acknowledge that pre and post exercise glucose checks will be done for 3 sessions at entry of program.   Education: Know Your Numbers and Risk Factors: -Group verbal and written instruction about important numbers in your health.  Discussion of what are risk factors and how they play a role in the disease process.  Review of Cholesterol, Blood Pressure, Diabetes, and BMI and the role they play in your overall health.   [facility]  Date 11/21/19  Educator SB  Instruction Review Code 1- Verbalizes Understanding      Core Components/Risk Factors/Patient Goals Review:   Goals and Risk Factor Review    Row Name 09/03/19 1144 09/24/19 1135 11/05/19 1134 11/21/19 1126       Core Components/Risk Factors/Patient Goals Review   Personal Goals Review Weight Management/Obesity;Improve shortness of breath with ADL's;Hypertension Weight Management/Obesity;Improve shortness of breath with ADL's;Hypertension Weight Management/Obesity;Improve shortness of breath  with ADL's;Hypertension Weight Management/Obesity;Improve shortness of breath with ADL's;Hypertension    Review Hannah Green is doing well in rehab.  Her weight is trending down.  She just doesn't like our scale compared to hers at home. Her blood pressures are good in class and she  does not check them at home.  Her doctor wants her to check it.  Her breathing is doing well. Hannah Green just got back from vacation so her weight may be up.  She doesnt monitor BP at home.  She has a time scheduled to meet with RD about diet. She doesnt monitor BP at home, she has a cuff at home and will start monitoring it. She doesnt monitor BP at home, she has a cuff at home and will start monitoring it - encouraged checking it 1x/day not at rehab. Improving SOB and practicing PLB.    Expected Outcomes Short: Start checking blood pressures. Long: Continue to monitor risk factors. Short: neet with RD Long: manage risk factors Short: PLB anf monitor BP Long: manage risk factors Short: PLB and monitor BP Long: manage risk factors           Core Components/Risk Factors/Patient Goals at Discharge (Final Review):   Goals and Risk Factor Review - 11/21/19 1126      Core Components/Risk Factors/Patient Goals Review   Personal Goals Review Weight Management/Obesity;Improve shortness of breath with ADL's;Hypertension    Review She doesnt monitor BP at home, she has a cuff at home and will start monitoring it - encouraged checking it 1x/day not at rehab. Improving SOB and practicing PLB.    Expected Outcomes Short: PLB and monitor BP Long: manage risk factors           ITP Comments:  ITP Comments    Row Name 08/07/19 1413 08/13/19 1458 08/14/19 0653 08/15/19 1120 09/11/19 1050   ITP Comments Virtual Visit completed. Patient informed on EP and RD appointment and 6 Minute walk test. Patient also informed of patient health questionnaires on My Chart. Patient Verbalizes understanding. Visit diagnosis can be found in [facility] 08/05/2019. Completed 6MWT and gym orientation.  Initial ITP created and sent for review to Dr. Emily Filbert, Medical Director. 30 Day review completed. Medical Director ITP review done, changes made as directed, and signed approval by Medical Director. First full day of exercise!  Patient was  oriented to gym and equipment including functions, settings, policies, and procedures.  Patient's individual exercise prescription and treatment plan were reviewed.  All starting workloads were established based on the results of the 6 minute walk test done at initial orientation visit.  The plan for exercise progression was also introduced and progression will be customized based on patient's performance and goals. 30 Day review completed. Medical Director ITP review done, changes made as directed, and signed approval by Medical Director.   [place] Name 10/09/19 0629 11/06/19 1615 11/19/19 1451 12/04/19 0603     ITP Comments 30 Day review completed. Medical Director ITP review done, changes made as directed, and signed approval by Medical Director. 30 day review completed. ITP sent to Dr. Emily Filbert, Medical Director of Cardiac and Pulmonary Rehab. Continue with ITP unless changes are made by physician. Called out today with MD appt 30 Day review completed. Medical Director ITP review done, changes made as directed, and signed approval by Medical Director.           Comments:

## 2019-12-06 IMAGING — DX DG CHEST 1V PORT
1 series · 1 of 1 positions shown · non-contrast
Comparison: 05/01/2018

CLINICAL DATA: - Air leak/chest tube

EXAM:
PORTABLE CHEST 1 VIEW

[chest ap]
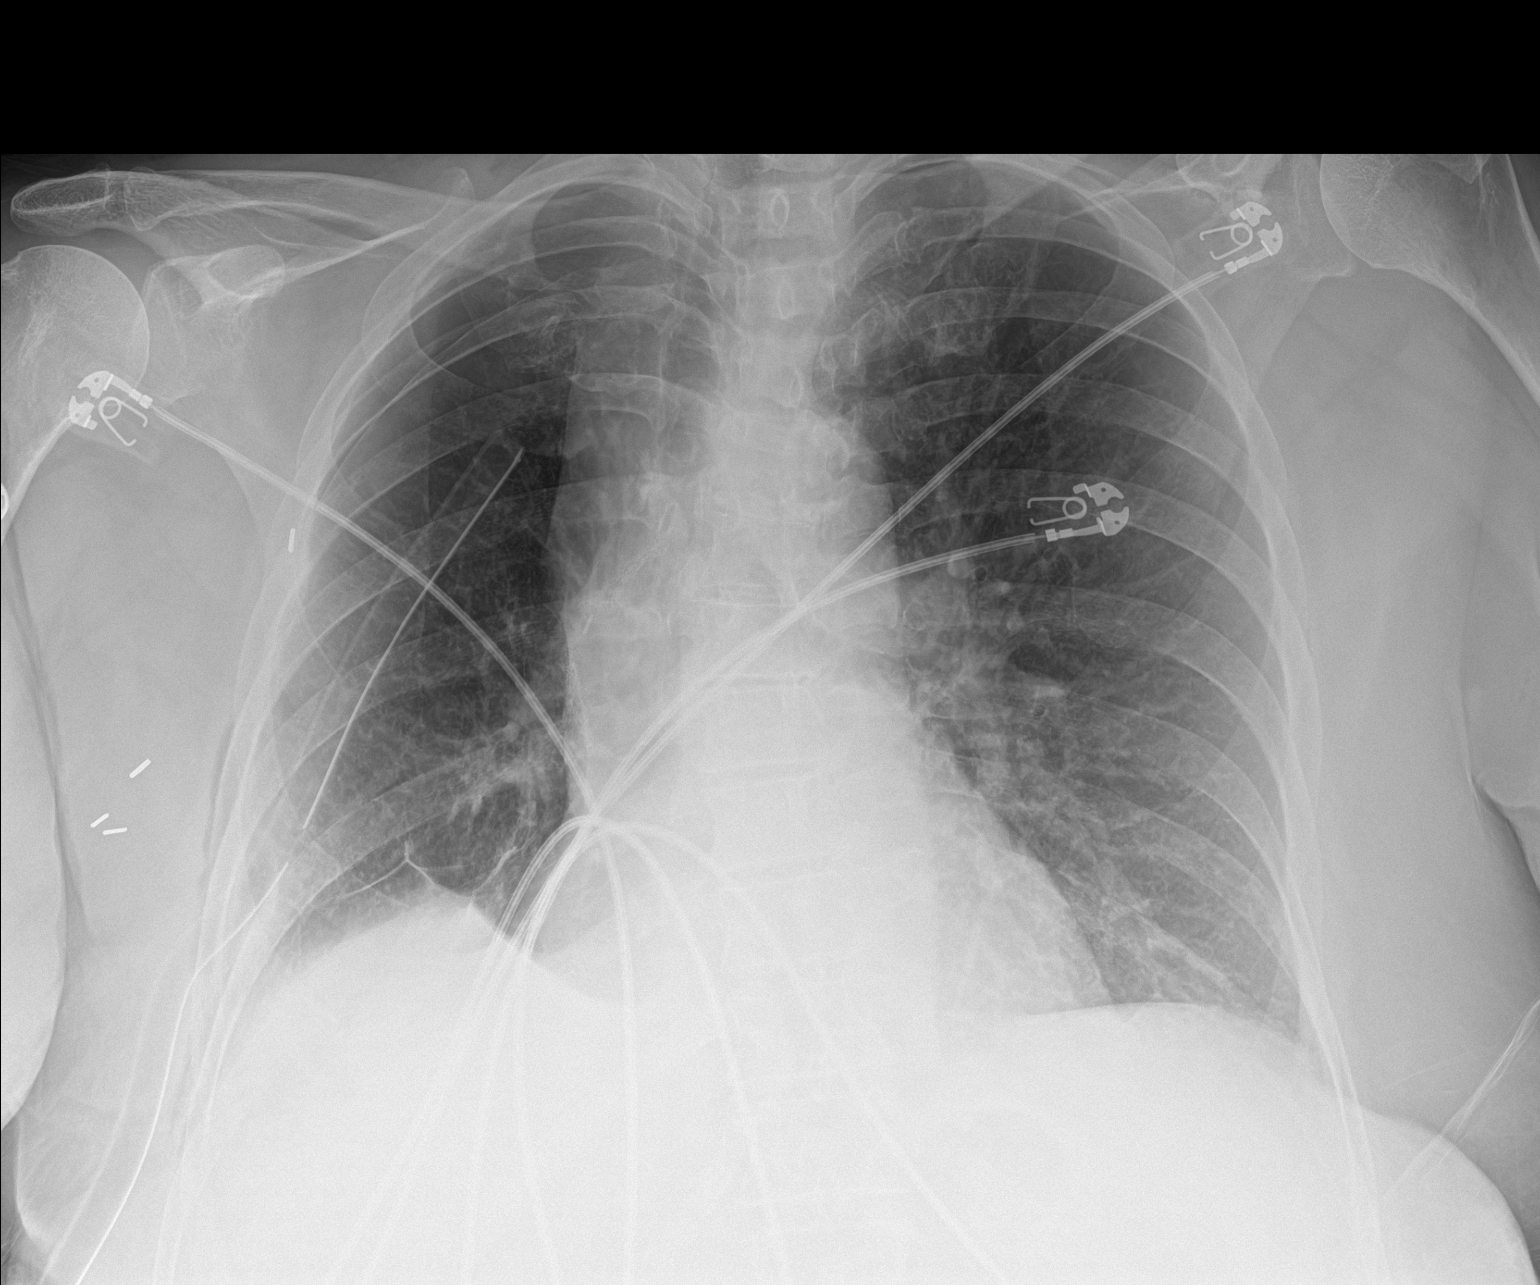

[1 of 1 positions shown; findings below may reference images not displayed]

FINDINGS: Reduction in volume of RIGHT pneumothorax with the apical pleural
edge measuring 20 mm from the chest wall compared to 40 mm on prior.
RIGHT chest tube in place. Atelectasis at the RIGHT lung base. LEFT
lung clear.
IMPRESSION: Reduction in volume of RIGHT pneumothorax.  Chest tube in place.

## 2019-12-07 IMAGING — DX DG CHEST 1V PORT
1 series · 1 of 1 positions shown · non-contrast
Comparison: 05/02/2018

CLINICAL DATA: Status post right upper lobectomy

EXAM:
PORTABLE CHEST 1 VIEW

[chest]
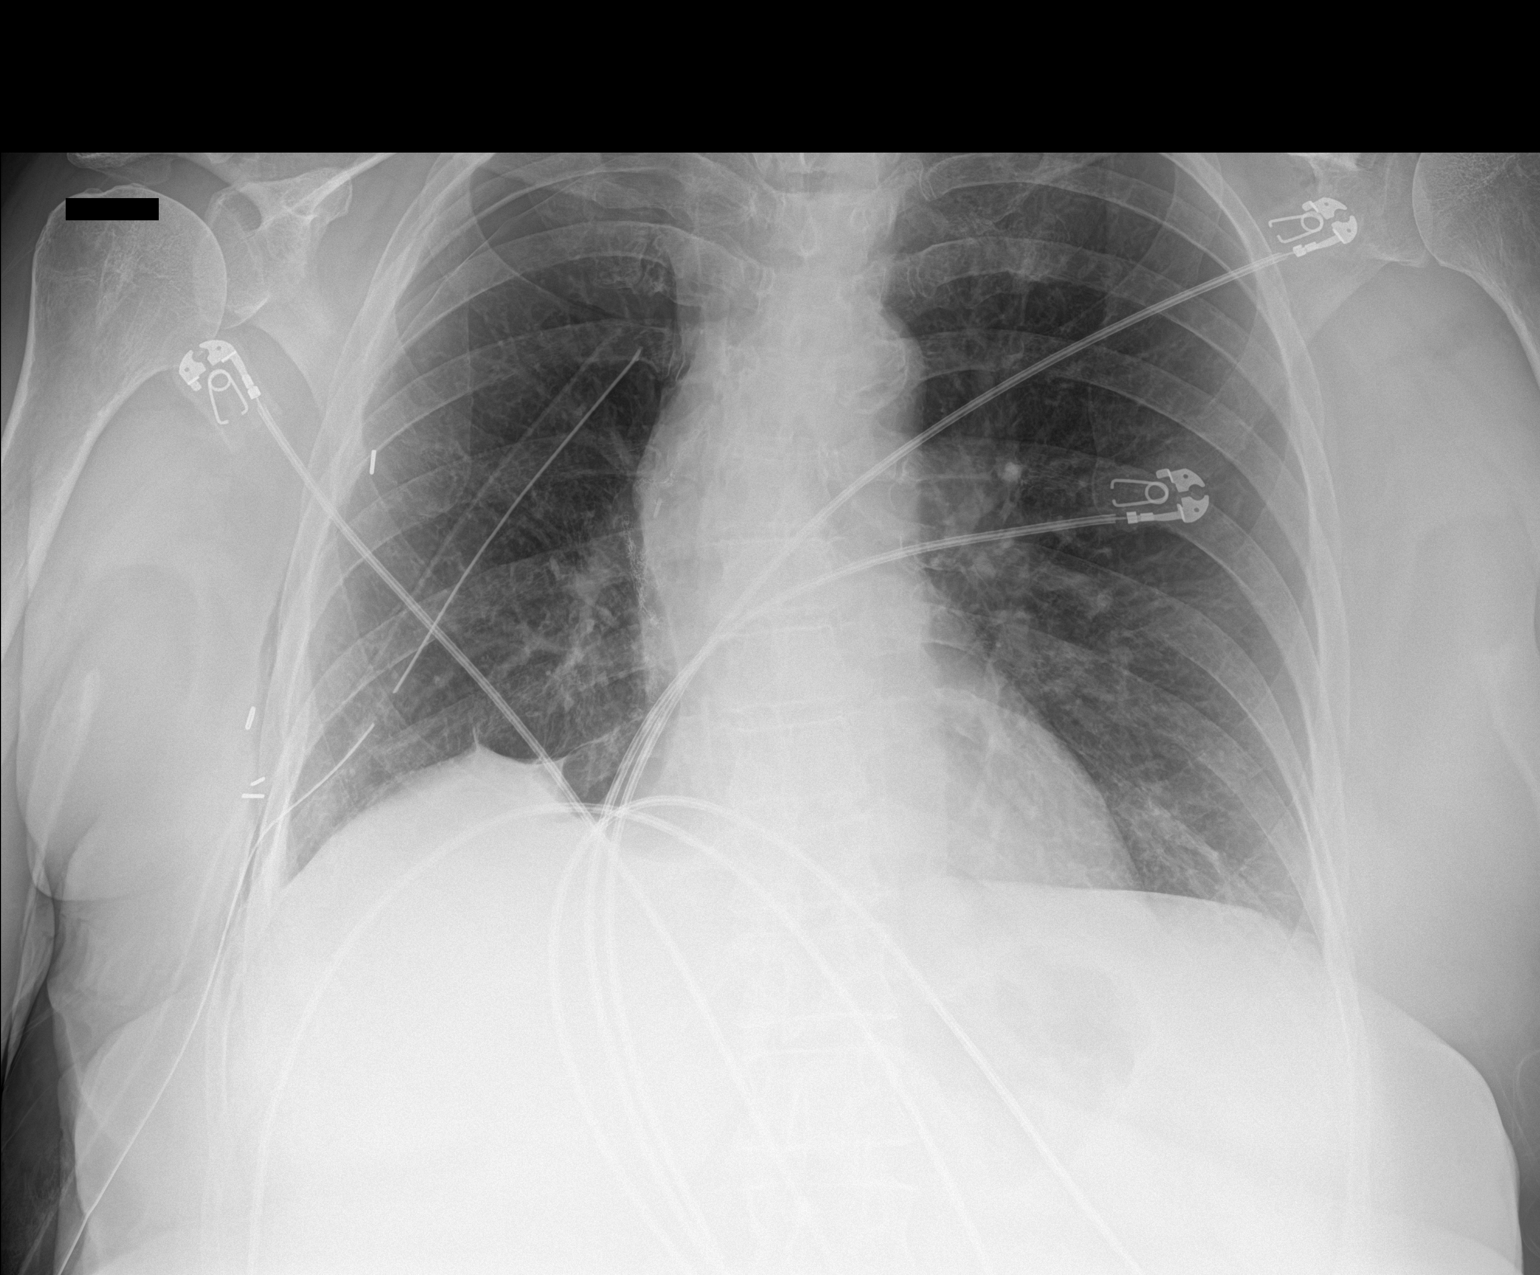

[1 of 1 positions shown; findings below may reference images not displayed]

FINDINGS: Stable right chest tube and estimated 10% right apical pneumothorax.
Postoperative changes and volume loss in the right lung.
Subsegmental atelectasis at the left base is minimal and stable.
Normal heart size.
IMPRESSION: Stable estimated 10% right pneumothorax with right chest tube in
place.

## 2019-12-09 ENCOUNTER — Telehealth: Payer: Self-pay | Admitting: Family Medicine

## 2019-12-09 IMAGING — DX DG CHEST 1V PORT
1 series · 1 of 1 positions shown · non-contrast
Comparison: Earlier today at 2222 hours.

CLINICAL DATA: Right-sided lobectomy with chest tube removal.

EXAM:
PORTABLE CHEST 1 VIEW

[chest]
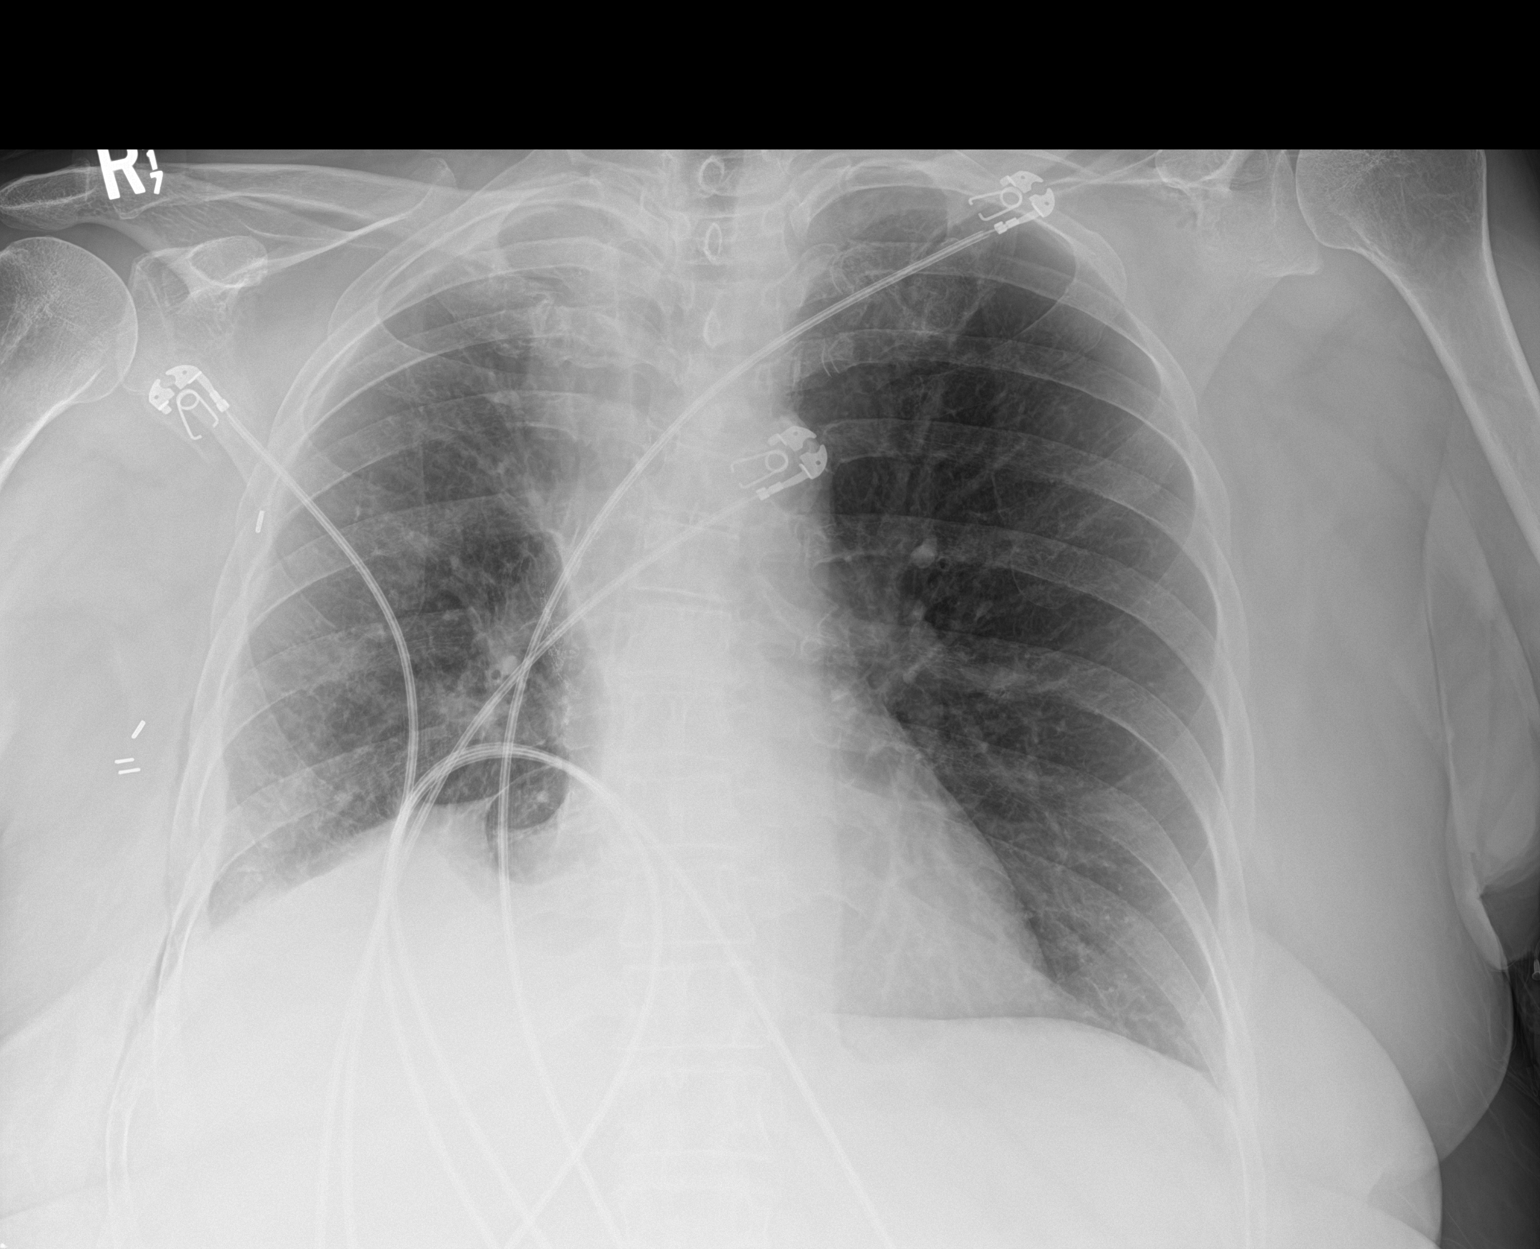

[1 of 1 positions shown; findings below may reference images not displayed]

FINDINGS: 5549 hours. Volume loss in the right hemithorax with tracheal
deviation to the right. Removal of right chest tube. There is trace
air about the right chest wall, but no residual right-sided
pneumothorax. No pleural fluid. Normal heart size. Clear left lung.
IMPRESSION: Removal of right chest tube, without pneumothorax.

Surgical changes within the right hemithorax.

## 2019-12-09 NOTE — Telephone Encounter (Signed)
Please schedule MWV with nurse and CPE with Dr. Bedsole. 

## 2019-12-16 ENCOUNTER — Encounter: Payer: Self-pay | Admitting: *Deleted

## 2019-12-16 DIAGNOSIS — J449 Chronic obstructive pulmonary disease, unspecified: Secondary | ICD-10-CM

## 2019-12-18 ENCOUNTER — Telehealth: Payer: Self-pay | Admitting: *Deleted

## 2019-12-18 NOTE — Telephone Encounter (Signed)
Called to check on patient.  She stated that she has had a lot going on recently.  She has only attended 20 times in 18 weeks.  She was encouraged to improve her attendance to get full benefit of program.  She intends to be here tomorrow.

## 2019-12-19 ENCOUNTER — Other Ambulatory Visit: Payer: Self-pay

## 2019-12-19 ENCOUNTER — Encounter: Payer: Medicare HMO | Admitting: *Deleted

## 2019-12-19 DIAGNOSIS — J449 Chronic obstructive pulmonary disease, unspecified: Secondary | ICD-10-CM

## 2019-12-19 NOTE — Progress Notes (Signed)
Daily Session Note  Patient Details  Name: Hannah Green MRN: [id number] Date of Birth: June 13, 1945 Referring Provider:     Pulmonary Rehab from 08/13/2019 in [facility] Cardiac and Pulmonary Rehab  Referring Provider [name] DO      Encounter Date: 12/19/2019  Check In:  Session Check In - 12/19/19 1135      Check-In   Supervising physician immediately available to respond to emergencies See telemetry face sheet for immediately available ER MD    Location [facility]-Cardiac & Pulmonary Rehab    Staff Present Hope Budds RDN, Luther Redo, MPA, RN;Jasim Harari Sherryll Burger, RN BSN;Joseph Lou Miner, [place] Exercise Physiologist    Virtual Visit No    Medication changes reported     No    Fall or balance concerns reported    No    Warm-up and Cool-down Performed on first and last piece of equipment    Resistance Training Performed Yes    VAD Patient? No    PAD/SET Patient? No      Pain Assessment   Currently in Pain? No/denies              Social History   Tobacco Use  Smoking Status Former Smoker  . Packs/day: 1.50  . Years: 50.00  . Pack years: 75.00  . Types: Cigarettes  . Quit date: 04/01/2011  . Years since quitting: 8.7  Smokeless Tobacco Never Used  Tobacco Comment   has stopped but has started again in "crisis"    Goals Met:  Independence with exercise equipment Exercise tolerated well No report of cardiac concerns or symptoms Strength training completed today  Goals Unmet:  Not Applicable  Comments: Pt able to follow exercise prescription today without complaint.  Will continue to monitor for progression.    Dr. Emily Filbert is Medical Director for [place] and [facility].

## 2019-12-24 ENCOUNTER — Encounter: Payer: Medicare HMO | Admitting: *Deleted

## 2019-12-24 ENCOUNTER — Other Ambulatory Visit: Payer: Self-pay

## 2019-12-24 DIAGNOSIS — J449 Chronic obstructive pulmonary disease, unspecified: Secondary | ICD-10-CM | POA: Diagnosis not present

## 2019-12-24 NOTE — Progress Notes (Signed)
Daily Session Note  Patient Details  Name: SCARLETTE HOGSTON MRN: [id number] Date of Birth: 25-May-1945 Referring Provider:     Pulmonary Rehab from 08/13/2019 in [facility] Cardiac and Pulmonary Rehab  Referring Provider [name] DO      Encounter Date: 12/24/2019  Check In:  Session Check In - 12/24/19 1057      Check-In   Supervising physician immediately available to respond to emergencies See telemetry face sheet for immediately available ER MD    Location [facility]-Cardiac & Pulmonary Rehab    Staff Present Renita Papa, RN BSN;Joseph Lou Miner, [place] Exercise Physiologist;Melissa Tilford Pillar RDN, LDN    Virtual Visit No    Medication changes reported     No    Fall or balance concerns reported    No    Warm-up and Cool-down Performed on first and last piece of equipment    Resistance Training Performed Yes    VAD Patient? No    PAD/SET Patient? No      Pain Assessment   Currently in Pain? No/denies              Social History   Tobacco Use  Smoking Status Former Smoker  . Packs/day: 1.50  . Years: 50.00  . Pack years: 75.00  . Types: Cigarettes  . Quit date: 04/01/2011  . Years since quitting: 8.7  Smokeless Tobacco Never Used  Tobacco Comment   has stopped but has started again in "crisis"    Goals Met:  Independence with exercise equipment Exercise tolerated well No report of cardiac concerns or symptoms Strength training completed today  Goals Unmet:  Not Applicable  Comments: Pt able to follow exercise prescription today without complaint.  Will continue to monitor for progression.    Dr. Emily Filbert is Medical Director for [place] and [facility].

## 2019-12-25 NOTE — Telephone Encounter (Signed)
Labs 12/14 Medicare wellness 12/17 cpx 12/21 Pt aware

## 2019-12-26 ENCOUNTER — Other Ambulatory Visit: Payer: Self-pay

## 2019-12-26 DIAGNOSIS — J449 Chronic obstructive pulmonary disease, unspecified: Secondary | ICD-10-CM | POA: Diagnosis not present

## 2019-12-26 NOTE — Progress Notes (Signed)
Daily Session Note  Patient Details  Name: Hannah Green MRN: [id number] Date of Birth: 07-Sep-1945 Referring Provider:     Pulmonary Rehab from 08/13/2019 in [facility] Cardiac and Pulmonary Rehab  Referring Provider [name] DO      Encounter Date: 12/26/2019  Check In:  Session Check In - 12/26/19 1134      Check-In   Supervising physician immediately available to respond to emergencies See telemetry face sheet for immediately available ER MD    Location [facility]-Cardiac & Pulmonary Rehab    Staff Present Birdie Sons, MPA, RN;Melissa Caiola RDN, Rowe Pavy, BA, ACSM CEP, Exercise Physiologist;Kara Eliezer Bottom, MS Exercise Physiologist    Virtual Visit No    Medication changes reported     No    Fall or balance concerns reported    No    Warm-up and Cool-down Performed on first and last piece of equipment    Resistance Training Performed Yes    VAD Patient? No    PAD/SET Patient? No      Pain Assessment   Currently in Pain? No/denies              Social History   Tobacco Use  Smoking Status Former Smoker  . Packs/day: 1.50  . Years: 50.00  . Pack years: 75.00  . Types: Cigarettes  . Quit date: 04/01/2011  . Years since quitting: 8.7  Smokeless Tobacco Never Used  Tobacco Comment   has stopped but has started again in "crisis"    Goals Met:  Independence with exercise equipment Exercise tolerated well No report of cardiac concerns or symptoms Strength training completed today  Goals Unmet:  Not Applicable  Comments: Pt able to follow exercise prescription today without complaint.  Will continue to monitor for progression.    Dr. Emily Filbert is Medical Director for [place] and [facility].

## 2020-01-01 ENCOUNTER — Encounter: Payer: Self-pay | Admitting: *Deleted

## 2020-01-01 DIAGNOSIS — J449 Chronic obstructive pulmonary disease, unspecified: Secondary | ICD-10-CM

## 2020-01-01 NOTE — Progress Notes (Signed)
Pulmonary Individual Treatment Plan  Patient Details  Name: Hannah Green MRN: [id number] Date of Birth: 24-Dec-1945 Referring Provider:     Pulmonary Rehab from 08/13/2019 in [facility] Cardiac and Pulmonary Rehab  Referring Provider Hannah Leap DO      Initial Encounter Date:    Pulmonary Rehab from 08/13/2019 in [facility] Cardiac and Pulmonary Rehab  Date 08/13/19      Visit Diagnosis: Stage 3 severe COPD by GOLD classification (Hannah Green)  Patient's Home Medications on Admission:  Current Outpatient Medications:  .  albuterol (VENTOLIN HFA) 108 (90 Base) MCG/ACT inhaler, TAKE 2 PUFFS BY MOUTH EVERY 6 HOURS AS NEEDED FOR WHEEZE OR SHORTNESS OF BREATH, Disp: 8.5 g, Rfl: 3 .  aspirin EC 81 MG tablet, Take 1 tablet (81 mg total) by mouth daily., Disp: 90 tablet, Rfl: 3 .  atorvastatin (LIPITOR) 10 MG tablet, TAKE 1 TABLET BY MOUTH EVERY DAY, Disp: 90 tablet, Rfl: 0 .  cholecalciferol (VITAMIN D3) 25 MCG (1000 UT) tablet, Take 1,000 Units by mouth daily., Disp: , Rfl:  .  Fluticasone-Umeclidin-Vilant (TRELEGY ELLIPTA) 100-62.5-25 MCG/INH AEPB, Inhale 1 puff into the lungs daily., Disp: 60 each, Rfl: 5  Past Medical History: Past Medical History:  Diagnosis Date  . Breast cancer ([place]) 1999  . Cataract 2017   resolved with surgery  . COPD (chronic obstructive pulmonary disease) ([place])   . Coronary artery disease   . GERD (gastroesophageal reflux disease)   . Hyperlipidemia   . Postoperative atrial fibrillation ([place]) 03/2018  . Rheumatoid arthritis(714.0)   . Squamous cell carcinoma of lung, stage I, right ([place]) 2020   Status post right upper and middle lobectomy    Tobacco Use: Social History   Tobacco Use  Smoking Status Former Smoker  . Packs/day: 1.50  . Years: 50.00  . Pack years: 75.00  . Types: Cigarettes  . Quit date: 04/01/2011  . Years since quitting: 8.7  Smokeless Tobacco Never Used  Tobacco Comment   has stopped but has started again in "crisis"    Labs: Recent  Review Flowsheet Data    Labs for ITP Cardiac and Pulmonary Rehab Latest Ref Rng & Units 04/10/2018 04/19/2018 04/23/2018 04/24/2018 12/11/2018   Cholestrol 0 - 200 mg/dL - - - - 218(H)   LDLCALC 0 - 99 mg/dL - - - - 127(H)   LDLDIRECT mg/dL - - - - -   HDL >39.00 mg/dL - - - - 57.90   Trlycerides 0 - 149 mg/dL - - - - 165.0(H)   Hemoglobin A1c 4.6 - 6.5 % - - - - 5.9   PHART 7.35 - 7.45 - TEST WILL BE CREDITED 7.425 7.354 -   PCO2ART 32 - 48 mmHg - TEST WILL BE CREDITED 41.1 48.4(H) -   HCO3 20.0 - 28.0 mmol/L 29.0(H) TEST WILL BE CREDITED 26.5 26.3 -   TCO2 22 - 32 mmol/L 31 - - - -   ACIDBASEDEF 0.0 - 2.0 mmol/L - TEST WILL BE CREDITED - - -   O2SAT % 76.0 TEST WILL BE CREDITED 97.7 98.6 -       Pulmonary Assessment Scores:  Pulmonary Assessment Scores    Row Name 08/13/19 1507         ADL UCSD   ADL Phase Entry     SOB Score total 57     Rest 1     Walk 3     Stairs 4     Bath 0     Dress 0  Shop 4       CAT Score   CAT Score 21       mMRC Score   mMRC Score 3            UCSD: Self-administered rating of dyspnea associated with activities of daily living (ADLs) 6-point scale (0 = "not at all" to 5 = "maximal or unable to do because of breathlessness")  Scoring Scores range from 0 to 120.  Minimally important difference is 5 units  CAT: CAT can identify the health impairment of COPD patients and is better correlated with disease progression.  CAT has a scoring range of zero to 40. The CAT score is classified into four groups of low (less than 10), medium (10 - 20), high (21-30) and very high (31-40) based on the impact level of disease on health status. A CAT score over 10 suggests significant symptoms.  A worsening CAT score could be explained by an exacerbation, poor medication adherence, poor inhaler technique, or progression of COPD or comorbid conditions.  CAT MCID is 2 points  mMRC: mMRC (Modified Medical Research Council) Dyspnea Scale is used to assess  the degree of baseline functional disability in patients of respiratory disease due to dyspnea. No minimal important difference is established. A decrease in score of 1 point or greater is considered a positive change.   Pulmonary Function Assessment:  Pulmonary Function Assessment - 08/07/19 1410      Breath   Shortness of Breath Yes;Limiting activity           Exercise Target Goals: Exercise Program Goal: Individual exercise prescription set using results from initial 6 min walk test and THRR while considering  patient's activity barriers and safety.   Exercise Prescription Goal: Initial exercise prescription builds to 30-45 minutes a day of aerobic activity, 2-3 days per week.  Home exercise guidelines will be given to patient during program as part of exercise prescription that the participant will acknowledge.  Education: Aerobic Exercise & Resistance Training: - Gives group verbal and written instruction on the various components of exercise. Focuses on aerobic and resistive training programs and the benefits of this training and how to safely progress through these programs..   Pulmonary Rehab from 12/26/2019 in [facility] Cardiac and Pulmonary Rehab  Date 12/26/19  Hannah Green only on 10/28]  Educator [facility]  Instruction Review Code 1- [employer] Understanding      Education: Exercise & Equipment Safety: - Individual verbal instruction and demonstration of equipment use and safety with use of the equipment.   Pulmonary Rehab from 12/26/2019 in [facility] Cardiac and Pulmonary Rehab  Date 08/13/19  Educator [facility]  Instruction Review Code 1- Verbalizes Understanding      Education: Exercise Physiology & General Exercise Guidelines: - Group verbal and written instruction with models to review the exercise physiology of the cardiovascular system and associated critical values. Provides general exercise guidelines with specific guidelines to those with heart or lung disease.    Education:  Flexibility, Balance, Mind/Body Relaxation: Provides group verbal/written instruction on the benefits of flexibility and balance training, including mind/body exercise modes such as yoga, pilates and tai chi.  Demonstration and skill practice provided.   Activity Barriers & Risk Stratification:  Activity Barriers & Cardiac Risk Stratification - 08/13/19 1501      Activity Barriers & Cardiac Risk Stratification   Activity Barriers Deconditioning;Joint Problems;Muscular Weakness;Shortness of Breath;Balance Concerns   r knee pain          6 Minute Walk:  6  Minute Walk    Row Name 08/13/19 1459         6 Minute Walk   Phase Initial     Distance 1035 feet     Walk Time 5 minutes     # of Rest Breaks 1  1 min     MPH 2.35     METS 2.32     RPE 13     Perceived Dyspnea  3     VO2 Peak 8.11     Symptoms Yes (comment)     Comments SOB, knee pain (5/10)     Resting HR 70 bpm     Resting BP 132/74     Resting Oxygen Saturation  98 %     Exercise Oxygen Saturation  during 6 min walk 94 %     Max Ex. HR 108 bpm     Max Ex. BP 204/70     2 Minute Post BP 160/80       Interval HR   1 Minute HR 94     2 Minute HR 108     3 Minute HR 102     4 Minute HR 102  seated     5 Minute HR 79     6 Minute HR 100     2 Minute Post HR 71     Interval Heart Rate? Yes       Interval Oxygen   Interval Oxygen? Yes     Baseline Oxygen Saturation % 98 %     1 Minute Oxygen Saturation % 97 %     1 Minute Liters of Oxygen 0 L  Room Air     2 Minute Oxygen Saturation % 94 %     2 Minute Liters of Oxygen 0 L     3 Minute Oxygen Saturation % 96 %     3 Minute Liters of Oxygen 0 L     4 Minute Oxygen Saturation % 95 %     4 Minute Liters of Oxygen 0 L     5 Minute Oxygen Saturation % 97 %     5 Minute Liters of Oxygen 0 L     6 Minute Oxygen Saturation % 96 %     6 Minute Liters of Oxygen 0 L     2 Minute Post Oxygen Saturation % 98 %     2 Minute Post Liters of Oxygen 0 L            Oxygen Initial Assessment:  Oxygen Initial Assessment - 08/07/19 1409      Home Oxygen   Home Oxygen Device None    Sleep Oxygen Prescription None    Home Exercise Oxygen Prescription None    Home Resting Oxygen Prescription None    Compliance with Home Oxygen Use Yes      Initial 6 min Walk   Oxygen Used None      Program Oxygen Prescription   Program Oxygen Prescription None      Intervention   Short Term Goals To learn and exhibit compliance with exercise, home and travel O2 prescription;To learn and understand importance of monitoring SPO2 with pulse oximeter and demonstrate accurate use of the pulse oximeter.;To learn and understand importance of maintaining oxygen saturations>88%;To learn and demonstrate proper pursed lip breathing techniques or other breathing techniques.;To learn and demonstrate proper use of respiratory medications    Long  Term Goals Exhibits compliance with exercise, home and travel O2 prescription;Verbalizes importance  of monitoring SPO2 with pulse oximeter and return demonstration;Maintenance of O2 saturations>88%;Exhibits proper breathing techniques, such as pursed lip breathing or other method taught during program session;Compliance with respiratory medication;Demonstrates proper use of MDI's           Oxygen Re-Evaluation:  Oxygen Re-Evaluation    Row Name 08/15/19 1121 09/03/19 1156 09/24/19 1150 11/21/19 1124 12/24/19 1134     Program Oxygen Prescription   Program Oxygen Prescription _0      Home Oxygen   Home Oxygen Device _1    Sleep Oxygen Prescription _2    Home Exercise Oxygen Prescription _3    Home Resting Oxygen Prescription _4    Compliance with Home Oxygen Use Yes Yes Yes Yes --     Goals/Expected Outcomes   Short Term Goals To learn and exhibit compliance with exercise, home and travel O2 prescription;To learn and  understand importance of monitoring SPO2 with pulse oximeter and demonstrate accurate use of the pulse oximeter.;To learn and understand importance of maintaining oxygen saturations>88%;To learn and demonstrate proper pursed lip breathing techniques or other breathing techniques.;To learn and demonstrate proper use of respiratory medications To learn and exhibit compliance with exercise, home and travel O2 prescription;To learn and understand importance of monitoring SPO2 with pulse oximeter and demonstrate accurate use of the pulse oximeter.;To learn and understand importance of maintaining oxygen saturations>88%;To learn and demonstrate proper pursed lip breathing techniques or other breathing techniques.;To learn and demonstrate proper use of respiratory medications To learn and exhibit compliance with exercise, home and travel O2 prescription;To learn and understand importance of monitoring SPO2 with pulse oximeter and demonstrate accurate use of the pulse oximeter.;To learn and understand importance of maintaining oxygen saturations>88%;To learn and demonstrate proper pursed lip breathing techniques or other breathing techniques.;To learn and demonstrate proper use of respiratory medications To learn and exhibit compliance with exercise, home and travel O2 prescription;To learn and understand importance of monitoring SPO2 with pulse oximeter and demonstrate accurate use of the pulse oximeter.;To learn and understand importance of maintaining oxygen saturations>88%;To learn and demonstrate proper pursed lip breathing techniques or other breathing techniques.;To learn and demonstrate proper use of respiratory medications To learn and demonstrate proper use of respiratory medications   Long  Term Goals Exhibits compliance with exercise, home and travel O2 prescription;Verbalizes importance of monitoring SPO2 with pulse oximeter and return demonstration;Maintenance of O2 saturations>88%;Exhibits proper  breathing techniques, such as pursed lip breathing or other method taught during program session;Compliance with respiratory medication;Demonstrates proper use of MDI's Exhibits compliance with exercise, home and travel O2 prescription;Verbalizes importance of monitoring SPO2 with pulse oximeter and return demonstration;Maintenance of O2 saturations>88%;Exhibits proper breathing techniques, such as pursed lip breathing or other method taught during program session;Compliance with respiratory medication;Demonstrates proper use of MDI's Exhibits compliance with exercise, home and travel O2 prescription;Verbalizes importance of monitoring SPO2 with pulse oximeter and return demonstration;Maintenance of O2 saturations>88%;Exhibits proper breathing techniques, such as pursed lip breathing or other method taught during program session;Compliance with respiratory medication;Demonstrates proper use of MDI's Exhibits compliance with exercise, home and travel O2 prescription;Verbalizes importance of monitoring SPO2 with pulse oximeter and return demonstration;Maintenance of O2 saturations>88%;Exhibits proper breathing techniques, such as pursed lip breathing or other method taught during program session;Compliance with respiratory medication;Demonstrates proper use of MDI's Demonstrates proper use of MDI's;Compliance with respiratory medication   Comments Reviewed PLB technique with pt.  Talked about how it works and  it's importance in maintaining their exercise saturations. Hannah Green is using her PLB and finds it helpful. She does not have a pulse oximeter so we talked about getting one.  She will look into it. Hannah Green plans to add a day of exercise in addition to LW class.  Staff reviewed importance of checking O2 during exercise. Staff reviewed importance of checking O2 during exercise, encouraged to take back out her pulse ox. Practices PLB and reports getting better with it. Hannah Green has no questions about her respiratory  medications and how to use them. She is taking her medications as prescribed.   Goals/Expected Outcomes Short: Become more profiecient at using PLB.   Long: Become independent at using PLB. Short: Get pulse oximeter.  Long: Continue to work on PLB. Short: monitor O2 when exercising at home Long: use PLB as needed Short: monitor O2 when exercising at home Long: use PLB as needed Short: continue with medication regimine and ask any questions she may have about them. Long: maintain respiratory medications independently          Oxygen Discharge (Final Oxygen Re-Evaluation):  Oxygen Re-Evaluation - 12/24/19 1134      Program Oxygen Prescription   Program Oxygen Prescription None      Home Oxygen   Home Oxygen Device None    Sleep Oxygen Prescription None    Home Exercise Oxygen Prescription None    Home Resting Oxygen Prescription None      Goals/Expected Outcomes   Short Term Goals To learn and demonstrate proper use of respiratory medications    Long  Term Goals Demonstrates proper use of MDI's;Compliance with respiratory medication    Comments Hannah Green has no questions about her respiratory medications and how to use them. She is taking her medications as prescribed.    Goals/Expected Outcomes Short: continue with medication regimine and ask any questions she may have about them. Long: maintain respiratory medications independently           Initial Exercise Prescription:  Initial Exercise Prescription - 08/13/19 1500      Date of Initial Exercise RX and Referring Provider   Date 08/13/19    Referring Provider Hannah Leap DO      Treadmill   MPH 1.7    Grade 0.5    Minutes 15    METs 2.42      NuStep   Level 1    SPM 80    Minutes 15    METs 2      REL-XR   Level 1    Speed 50    Minutes 15    METs 2      Prescription Details   Frequency (times per week) 2    Duration Progress to 30 minutes of continuous aerobic without signs/symptoms of physical distress       Intensity   THRR 40-80% of Max Heartrate 100-131    Ratings of Perceived Exertion 11-13    Perceived Dyspnea 0-4      Progression   Progression Continue to progress workloads to maintain intensity without signs/symptoms of physical distress.      Resistance Training   Training Prescription Yes    Weight 3 lb    Reps 10-15           Perform Capillary Blood Glucose checks as needed.  Exercise Prescription Changes:  Exercise Prescription Changes    Row Name 08/13/19 1500 08/27/19 1000 09/10/19 1300 09/24/19 1300 10/08/19 1300     Response to Exercise  Blood Pressure (Admit) 132/74 130/60 146/70 114/66 130/60   Blood Pressure (Exercise) 204/70  rck 168/80 136/70 148/68 142/68 164/72   Blood Pressure (Exit) 126/70 132/62 122/60 110/60 122/54   Heart Rate (Admit) 70 bpm 71 bpm 69 bpm 65 bpm 73 bpm   Heart Rate (Exercise) 108 bpm 108 bpm 97 bpm 87 bpm 103 bpm   Heart Rate (Exit) 78 bpm 80 bpm 80 bpm 79 bpm 77 bpm   Oxygen Saturation (Admit) 98 % 95 % 96 % 96 % 96 %   Oxygen Saturation (Exercise) 94 % 94 % 94 % 94 % 94 %   Oxygen Saturation (Exit) 94 % 97 % 98 % 93 % 96 %   Rating of Perceived Exertion (Exercise) _0 Perceived Dyspnea (Exercise) _1 Symptoms SOB, R knee pain 5/10 none none none none   Comments walk test results second full day of exercise -- -- --   Duration -- Progress to 30 minutes of  aerobic without signs/symptoms of physical distress Continue with 30 min of aerobic exercise without signs/symptoms of physical distress. Continue with 30 min of aerobic exercise without signs/symptoms of physical distress. Continue with 30 min of aerobic exercise without signs/symptoms of physical distress.   Intensity -- THRR unchanged THRR unchanged THRR unchanged THRR unchanged     Progression   Progression -- Continue to progress workloads to maintain intensity without signs/symptoms of physical distress. Continue to progress workloads to maintain intensity  without signs/symptoms of physical distress. Continue to progress workloads to maintain intensity without signs/symptoms of physical distress. Continue to progress workloads to maintain intensity without signs/symptoms of physical distress.   Average METs -- 2.5 2.5 2.75 2.5     Resistance Training   Training Prescription -- Yes Yes Yes Yes   Weight -- 3 lb 3 lb 3 lb 3 lb   Reps -- 10-15 10-15 10-15 10-15     Interval Training   Interval Training -- No No No No     Treadmill   MPH -- 1.7 1.9 1.9 1.8   Grade -- 0.5 0.5 0.5 0.5   Minutes -- _2 METs -- 2.42 2.6 2.59 2.59     NuStep   Level -- _3 SPM -- -- 80 -- 80   Minutes -- _4 METs -- 2.5 2.4 2.9 2.4     REL-XR   Level -- 1 -- 1 --   Minutes -- 15 -- 15 --     Home Exercise Plan   Plans to continue exercise at -- -- -- Home (comment)  walking, also considering joining [employer] (comment)  walking, also considering joining [employer] -- -- -- Add 2 additional days to program exercise sessions. Add 2 additional days to program exercise sessions.   Initial Home Exercises Provided -- -- -- 09/24/19 09/24/19   Row Name 10/22/19 1300 11/05/19 1300 11/19/19 1400 12/02/19 1000 12/30/19 1100     Response to Exercise   Blood Pressure (Admit) 128/64 122/66 126/62 132/60 120/58   Blood Pressure (Exercise) 150/62 146/50 154/60 160/80 172/62   Blood Pressure (Exit) 112/64 136/56 122/70 106/60 138/60   Heart Rate (Admit) 74 bpm 72 bpm 70 bpm 73 bpm 65 bpm   Heart Rate (Exercise) 106 bpm 107 bpm 89 bpm 108 bpm 107 bpm   Heart Rate (Exit) 80  bpm 84 bpm 80 bpm 78 bpm 86 bpm   Oxygen Saturation (Admit) 96 % 96 % 94 % 98 % 95 %   Oxygen Saturation (Exercise) 94 % 95 % 96 % 96 % 95 %   Oxygen Saturation (Exit) 97 % 98 % 97 % 95 % 90 %   Rating of Perceived Exertion (Exercise) _0 Perceived Dyspnea (Exercise) _1 Symptoms none none none -- none   Duration Continue with 30 min  of aerobic exercise without signs/symptoms of physical distress. Continue with 30 min of aerobic exercise without signs/symptoms of physical distress. Continue with 30 min of aerobic exercise without signs/symptoms of physical distress. Continue with 30 min of aerobic exercise without signs/symptoms of physical distress. Continue with 30 min of aerobic exercise without signs/symptoms of physical distress.   Intensity _2      Progression   Progression Continue to progress workloads to maintain intensity without signs/symptoms of physical distress. Continue to progress workloads to maintain intensity without signs/symptoms of physical distress. Continue to progress workloads to maintain intensity without signs/symptoms of physical distress. Continue to progress workloads to maintain intensity without signs/symptoms of physical distress. Continue to progress workloads to maintain intensity without signs/symptoms of physical distress.   Average METs 2.96 2.4 2.89 3.2 2.8     Resistance Training   Training Prescription _3    Weight 3 lb 3 lb 3 lb 3 lb 3 lb   Reps 10-15 10-15 10-15 10-15 10-15     Interval Training   Interval Training _4      Treadmill   MPH 1._5 Grade 0.5 0.5 0.5 0.5 0.5   Minutes _6 METs 2.59 2.67 2.67 2.67 2.67     NuStep   Level _7 SPM -- 80 -- 80 80   Minutes _8 METs 3.3 2.1 3 3.7 3     Biostep-RELP   Level 1 -- 1 -- --   Minutes 15 -- 15 -- --   METs 3 -- 3 -- --     Home Exercise Plan   Plans to continue exercise at Home (comment)  walking, also considering joining [employer] -- Home (comment)  walking, also considering joining [employer] -- Home (comment)  walking, also considering joining [employer] Add 2 additional days to program exercise sessions. -- Add 2 additional days to program exercise sessions. --  Add 2 additional days to program exercise sessions.   Initial Home Exercises Provided 09/24/19 -- 09/24/19 -- 09/24/19          Exercise Comments:   Exercise Goals and Review:  Exercise Goals    Row Name 08/13/19 1505             Exercise Goals   Increase Physical Activity Yes       Intervention Provide advice, education, support and counseling about physical activity/exercise needs.;Develop an individualized exercise prescription for aerobic and resistive training based on initial evaluation findings, risk stratification, comorbidities and participant's personal goals.       Expected Outcomes Short Term: Attend rehab on a regular basis to increase amount of physical activity.;Long Term: Add in home exercise to make exercise part of routine and to increase amount of physical  activity.;Long Term: Exercising regularly at least 3-5 days a week.       Increase Strength and Stamina Yes       Intervention Provide advice, education, support and counseling about physical activity/exercise needs.;Develop an individualized exercise prescription for aerobic and resistive training based on initial evaluation findings, risk stratification, comorbidities and participant's personal goals.       Expected Outcomes Short Term: Increase workloads from initial exercise prescription for resistance, speed, and METs.;Short Term: Perform resistance training exercises routinely during rehab and add in resistance training at home;Long Term: Improve cardiorespiratory fitness, muscular endurance and strength as measured by increased METs and functional capacity (6MWT)       Able to understand and use rate of perceived exertion (RPE) scale Yes       Intervention Provide education and explanation on how to use RPE scale       Expected Outcomes Short Term: Able to use RPE daily in rehab to express subjective intensity level;Long Term:  Able to use RPE to guide intensity level when exercising independently       Able to  understand and use Dyspnea scale Yes       Intervention Provide education and explanation on how to use Dyspnea scale       Expected Outcomes Short Term: Able to use Dyspnea scale daily in rehab to express subjective sense of shortness of breath during exertion;Long Term: Able to use Dyspnea scale to guide intensity level when exercising independently       Knowledge and understanding of Target Heart Rate Range (THRR) Yes       Intervention Provide education and explanation of THRR including how the numbers were predicted and where they are located for reference       Expected Outcomes Short Term: Able to state/look up THRR;Short Term: Able to use daily as guideline for intensity in rehab;Long Term: Able to use THRR to govern intensity when exercising independently       Able to check pulse independently Yes       Intervention Review the importance of being able to check your own pulse for safety during independent exercise;Provide education and demonstration on how to check pulse in carotid and radial arteries.       Expected Outcomes Short Term: Able to explain why pulse checking is important during independent exercise;Long Term: Able to check pulse independently and accurately       Understanding of Exercise Prescription Yes       Intervention Provide education, explanation, and written materials on patient's individual exercise prescription       Expected Outcomes Short Term: Able to explain program exercise prescription;Long Term: Able to explain home exercise prescription to exercise independently              Exercise Goals Re-Evaluation :  Exercise Goals Re-Evaluation    Row Name 08/15/19 1121 08/27/19 1054 09/03/19 1142 09/10/19 1305 09/24/19 1145     Exercise Goal Re-Evaluation   Exercise Goals Review Increase Physical Activity;Able to understand and use rate of perceived exertion (RPE) scale;Knowledge and understanding of Target Heart Rate Range (THRR);Understanding of Exercise  Prescription;Increase Strength and Stamina;Able to understand and use Dyspnea scale;Able to check pulse independently Increase Physical Activity;Understanding of Exercise Prescription;Increase Strength and Stamina Increase Physical Activity;Understanding of Exercise Prescription;Increase Strength and Stamina Increase Physical Activity;Understanding of Exercise Prescription;Increase Strength and Stamina Increase Physical Activity;Understanding of Exercise Prescription;Increase Strength and Stamina   Comments Reviewed RPE and dyspnea scales, THR and program prescription  with pt today.  Pt voiced understanding and was given a copy of goals to take home. Hannah Green is off to a good start in rehab.  She has completed her first two full days of exercise She is already up to level 4 on the NuStep.  We will continue to monitor her progress. Hannah Green is doing well in rehab.  She is not a fan on the XR. We will look at moving her around.  She is planning to walk and swim while out on vacation next week. Hannah Green has increased her speed on TM.  Staff will follow up with her when she returns from vacation. Reviewed home exercise with pt today.  Pt plans to walk/staff videos for exercise.  Reviewed THR, pulse, RPE, sign and symptoms, pulse oximetery and when to call 911 or MD.  Also discussed weather considerations and indoor options.  Pt voiced understanding.   Expected Outcomes Short: Use RPE daily to regulate intensity. Long: Follow program prescription in THR. Short:Continue to attend regularly Long: Continue to follow program prescription Short: Walk and swimmon vacation Long; Continue to improve stamina. Short: stay active on vacation Long: increase MET level Short: add one day of exercise in addition to program sessions Long:  increase stamina   Row Name 09/24/19 1335 10/08/19 1340 10/22/19 1349 11/05/19 1121 11/19/19 1452     Exercise Goal Re-Evaluation   Exercise Goals Review Increase Physical Activity;Understanding of  Exercise Prescription;Increase Strength and Stamina Increase Physical Activity;Understanding of Exercise Prescription;Increase Strength and Stamina Increase Physical Activity;Understanding of Exercise Prescription;Increase Strength and Stamina Increase Physical Activity;Understanding of Exercise Prescription;Increase Strength and Stamina Increase Physical Activity;Understanding of Exercise Prescription;Increase Strength and Stamina   Comments Hannah Green is doing well in rehab.  She is now up to level 4 onte NuStep.  We will continue to monitor her progress. Camry works at [employer] and reaches THR range during some of her sessions.  Staff will encourage increasing workloads slightly to maintain THR during session. Hannah Green has been doing well in rehab. She is now up to level 4 on the NuSep.  She is still on level 1 for BioStep, so we will encourage her to increase that workload.  We will continue to monitor her progress. Treadmill - 20 minutes 1x/week - 24mh. Discussed adding in weighted exercises and 150 minutes of cardio exercise. SBrookelyncontinues to have problems attending rehab consistently.  She was out all last week and called out again today.  Progression would be improved with better attendance.   Expected Outcomes Short: Start to slowly bring up workloads  Long: Continue to improve stamina. Short:  increase workloads Long: improve MET level Short: Increase workload on BioStep. Long: Continue to improve stamina. Short: Increase home exercise Long: Continue to improve stamina. Short: Improved attendance for rehab  Long: Continue to increase activity levels   Row Name 11/21/19 1127 12/02/19 1032 12/16/19 1545 12/24/19 1141 12/30/19 1132     Exercise Goal Re-Evaluation   Exercise Goals Review Increase Physical Activity;Understanding of Exercise Prescription;Increase Strength and Stamina Increase Physical Activity;Understanding of Exercise Prescription;Increase Strength and Stamina -- Increase Physical  Activity;Increase Strength and Stamina Increase Physical Activity;Increase Strength and Stamina   Comments SImmaculatecontinues to have problems attending rehab consistently. Progression would be improved with better attendance. SChristinis exercising at home - treadmill and resistance learned during rehab - walks on the treadmill 20-30 minutes 2x/week, resistance every day. SJamaehas increased MET level.  Staff will review correct THR/oxygen range for her exercise at home.  Out since last review.  Last attended on 11/26/19.  Average attendance is around once a week with 20 visits in 18 weeks. She was out of Rehab due to three friends passing away. Hannah Green is now ready to get back on track with exercise. Hannah Green has been inconsistent with attendance.  She plans to get back consistently.   Expected Outcomes Short: Improve attendance for rehab, continue to exercise at home, consistently use treadmill for 30 minutes Long: Continue to increase activity levels Short: continue exercise at home, attend consistently Long: increase overall stamina Short: Return to consistent attendance  Long: Exercise at home Short: resume regular attendance in [place]. Long: graduate [employer]. Short: attend LW consistently Long: improve stamina          Discharge Exercise Prescription (Final Exercise Prescription Changes):  Exercise Prescription Changes - 12/30/19 1100      Response to Exercise   Blood Pressure (Admit) 120/58    Blood Pressure (Exercise) 172/62    Blood Pressure (Exit) 138/60    Heart Rate (Admit) 65 bpm    Heart Rate (Exercise) 107 bpm    Heart Rate (Exit) 86 bpm    Oxygen Saturation (Admit) 95 %    Oxygen Saturation (Exercise) 95 %    Oxygen Saturation (Exit) 90 %    Rating of Perceived Exertion (Exercise) 13    Perceived Dyspnea (Exercise) 2    Symptoms none    Duration Continue with 30 min of aerobic exercise without signs/symptoms of physical distress.    Intensity THRR unchanged      Progression    Progression Continue to progress workloads to maintain intensity without signs/symptoms of physical distress.    Average METs 2.8      Resistance Training   Training Prescription Yes    Weight 3 lb    Reps 10-15      Interval Training   Interval Training No      Treadmill   MPH 2    Grade 0.5    Minutes 15    METs 2.67      NuStep   Level 2    SPM 80    Minutes 15    METs 3      Home Exercise Plan   Plans to continue exercise at Home (comment)   walking, also considering joining [employer]   Frequency Add 2 additional days to program exercise sessions.    Initial Home Exercises Provided 09/24/19           Nutrition:  Target Goals: Understanding of nutrition guidelines, daily intake of sodium <1520m, cholesterol <2011m calories 30% from fat and 7% or less from saturated fats, daily to have 5 or more servings of fruits and vegetables.  Education: Controlling Sodium/Reading Food Labels -Group verbal and written material supporting the discussion of sodium use in heart healthy nutrition. Review and explanation with models, verbal and written materials for utilization of the food label.   Education: General Nutrition Guidelines/Fats and Fiber: -Group instruction provided by verbal, written material, models and posters to present the general guidelines for heart healthy nutrition. Gives an explanation and review of dietary fats and fiber.   [facility]  Date 11/07/19  [facility] Review Code 1- Verbalizes Understanding      Biometrics:  Pre Biometrics - 08/13/19 1505      Pre Biometrics   Height 5' 6.75" (1.695 m)    Weight 225 lb 12.8 oz (102.4  kg)    BMI (Calculated) 35.65    Single Leg Stand 10.94 seconds            Nutrition Therapy Plan and Nutrition Goals:  Nutrition Therapy & Goals - 10/03/19 1130      Nutrition Therapy   Diet Pulmonary MNT, heart healthy eating    Drug/Food  Interactions Statins/Certain Fruits    Protein (specify units) 80g    Fiber 25 grams    Whole Grain Foods 3 servings    Saturated Fats 12 max. grams    Fruits and Vegetables 5 servings/day    Sodium 1.5 grams      Personal Nutrition Goals   Nutrition Goal ST: add granola to ice cream, eat sweet potatos as a snack, add protein source to snack or breakfast LT: improve breathing (now 2/10)    Comments B: tries to eat when she gets up. An egg (soft boiled) toast or or english muffin (whole wheat) - butter. Coffee or hot tea (sugar free french vanilla creamer). S: L: tuna (mustard and green olives) with crackers (wheat thins (reduced fat)). Diet green tea. D: salmon, no red meat, some portk, mostly chicken or fish (baked). asparagus, lots of salad with other nonstarchy vegetables, baked vegetables (olive oil) S: loves to bake, ice cream Drinks: diet green tea and water Discussed heart healthy eaitng, pulmonary MNT, discussed adding one more good source of protein. Pt reports loving sweet potatos and would like to have granola on her ice cream (will find a healthy version)      Intervention Plan   Intervention Prescribe, educate and counsel regarding individualized specific dietary modifications aiming towards targeted core components such as weight, hypertension, lipid management, diabetes, heart failure and other comorbidities.;Nutrition handout(s) given to patient.    Expected Outcomes Short Term Goal: Understand basic principles of dietary content, such as calories, fat, sodium, cholesterol and nutrients.;Short Term Goal: A plan has been developed with personal nutrition goals set during dietitian appointment.;Long Term Goal: Adherence to prescribed nutrition plan.           Nutrition Assessments:  Nutrition Assessments - 08/13/19 1506      MEDFICTS Scores   Pre Score 49           MEDIFICTS Score Key:          ?70 Need to make dietary changes          40-70 Heart Healthy Diet         ?  40 Therapeutic Level Cholesterol Diet  Nutrition Goals Re-Evaluation:  Nutrition Goals Re-Evaluation    [place] Name 09/03/19 1157 11/05/19 1135 11/21/19 1115 12/24/19 1143       Goals   Current Weight -- -- -- 226 lb (102.5 kg)    Nutrition Goal Meet with dietician ST: continue current changes LT: improve breathing (now 3/10) ST: continue current changes, reduce sugar LT: improve breathing (now 3/10) Lose weight and watch sugar intake.    Comment Itali has already to started to make some healthier changes in her diet and seeing the difference in her weight and how she is feeling. She tries new recipes and has a heart healthy diet. continue current chnages. Henessy reports doing well with her nutrition and is still trying to cut back on sugar, discussed restirciton and cravings - she still wants to quit cold [place]. Reports still eating well and would like to continue with her current changes. She finds it hard not to eat ice cream. She eats ice  cream about twice a week from every night. She does not want to be a diabetic.    Expected Outcome Short: Meet with dietcian Long: Continue to work on healthy changes. ST: continue current changes LT: improve breathing (now 3/10) ST: continue current changes, reduce sugar LT: improve breathing (now 4/10) Short: cut out more sugar in her diet. Long: maintain diet independently that works for her.           Nutrition Goals Discharge (Final Nutrition Goals Re-Evaluation):  Nutrition Goals Re-Evaluation - 12/24/19 1143      Goals   Current Weight 226 lb (102.5 kg)    Nutrition Goal Lose weight and watch sugar intake.    Comment She finds it hard not to eat ice cream. She eats ice cream about twice a week from every night. She does not want to be a diabetic.    Expected Outcome Short: cut out more sugar in her diet. Long: maintain diet independently that works for her.           Psychosocial: Target Goals: Acknowledge presence or absence of significant  depression and/or stress, maximize coping skills, provide positive support system. Participant is able to verbalize types and ability to use techniques and skills needed for reducing stress and depression.   Education: Depression - Provides group verbal and written instruction on the correlation between heart/lung disease and depressed mood, treatment options, and the stigmas associated with seeking treatment.   Education: Sleep Hygiene -Provides group verbal and written instruction about how sleep can affect your health.  Define sleep hygiene, discuss sleep cycles and impact of sleep habits. Review good sleep hygiene tips.    Pulmonary Rehab from 12/26/2019 in [facility] Cardiac and Pulmonary Rehab  Date 09/05/19  Educator [facility]  Instruction Review Code 1- Verbalizes Understanding      Education: Stress and Anxiety: - Provides group verbal and written instruction about the health risks of elevated stress and causes of high stress.  Discuss the correlation between heart/lung disease and anxiety and treatment options. Review healthy ways to manage with stress and anxiety.   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      Family Dynamics   Good Support System? Yes    Comments She can look to her son for support. Patient is not able to sleep well.      Barriers   Psychosocial barriers to participate in program The patient should benefit from training in stress management and relaxation.      Screening Interventions   Interventions Encouraged to exercise;To provide support and resources with identified psychosocial needs;Provide feedback about the scores to  participant    Expected Outcomes Short Term goal: Utilizing psychosocial counselor, staff and physician to assist with identification of specific Stressors or current issues interfering with healing process. Setting desired goal for each stressor or current issue identified.;Long Term Goal: Stressors or current issues are controlled or eliminated.;Short Term goal: Identification and review with participant of any Quality of Life or Depression concerns found by scoring the questionnaire.;Long Term goal: The participant improves quality of Life and PHQ9 Scores as seen by post scores and/or verbalization of changes  Quality of Life Scores:  Scores of 19 and below usually indicate a poorer quality of life in these areas.  A difference of  2-3 points is a clinically meaningful difference.  A difference of 2-3 points in the total score of the Quality of Life Index has been associated with significant improvement in overall quality of life, self-image, physical symptoms, and general health in studies assessing change in quality of life.  PHQ-9: Recent Review Flowsheet Data    Depression screen [facility] 2/9 08/13/2019 12/11/2018 12/05/2017 11/24/2016 11/24/2015   Decreased Interest 0 0 0 1 0   Down, Depressed, Hopeless 0 0 1 0 0   PHQ - 2 Score 0 0 1 1 0   Altered sleeping 1 0 2 0 -   Tired, decreased energy 2 0 1 2 -   Change in appetite 2 0 2 2 -   Feeling bad or failure about yourself  0 0 1 1 -   Trouble concentrating 0 0 0 0 -   Moving slowly or fidgety/restless 0 0 0 0 -   Suicidal thoughts 0 0 0 0 -   PHQ-9 Score 5 0 7 6 -   Difficult doing work/chores Somewhat difficult Not difficult at all Not difficult at all Not difficult at all -     Interpretation of Total Score  Total Score Depression Severity:  1-4 = Minimal depression, 5-9 = Mild depression, 10-14 = Moderate depression, 15-19 = Moderately severe depression, 20-27 = Severe depression   Psychosocial Evaluation and  Intervention:  Psychosocial Evaluation - 08/07/19 1405      Psychosocial Evaluation & Interventions   Interventions Encouraged to exercise with the program and follow exercise prescription    Comments 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 She can look to her son for support. Patient is not able to sleep well.    Expected Outcomes Short: Continue to exercise regularly to support mental health and notify staff of any changes. Long: maintain mental health and well being through teaching of rehab or prescribed medications independently.    Continue Psychosocial Services  Follow up required by staff           Psychosocial Re-Evaluation:  Psychosocial Re-Evaluation    [place] Name 09/03/19 1146 09/24/19 1138 11/05/19 1125 11/21/19 1122 12/24/19 1138     Psychosocial Re-Evaluation   Current issues with Current Stress Concerns;Current Sleep Concerns -- Current Stress Concerns;Current Sleep Concerns Current Sleep Concerns Current Sleep Concerns;Current Stress Concerns   Comments Hannah Green is doing well in rehab.  She is going to the beach for the week next week to [facility]. She is looking forward to her trip to just get away for a few days.  She continues to have trouble sleeping, so I sent her information to review. Hannah Green still doesnt sleep well.  She has trouble going back to sleep if she gets up.  Other than sleep, she feels good mentally. Her family gives her stress and her plumbing has been backed up which is causing her stress. Hannah Green is still has trouble sleeping and thinks she may need a new mattress in addition to stress. Discussed sleep hygiene. Hannah Green rpeorts her stress is better, but reports her sleep is poor. She reports having to wake up to go to the bathroom. Hannah Green states that her sleep is better. She is the kind of person that once she wakes up she is awake for the day. She is going to  bed earlier to help her sleep through the night.   Expected Outcomes Short:  Review sleep info and enjoy vacation Long; Continue to stay positive. Short: continue to work on sleeping better Long: maintain positive outlook Short: continue to work on sleeping better Long: maintain positive outlook Short: continue to work on sleeping better, ask PCP about pelvic floor therapy to help with frequent urination Long: maintain positive outlook Short: continue going to bed early for adequate sleep. Long: maintain good sleeping habits to keep stress at a minimum.   Interventions Encouraged to attend Pulmonary Rehabilitation for the exercise;Stress management education -- Encouraged to attend Pulmonary Rehabilitation for the exercise;Stress management education Encouraged to attend Pulmonary Rehabilitation for the exercise Encouraged to attend Pulmonary Rehabilitation for the exercise   Continue Psychosocial Services  Follow up required by staff -- Follow up required by staff Follow up required by staff Follow up required by staff   Comments -- -- working on stress and sleep management sleep management --     Initial Review   Source of Stress Concerns -- -- Chronic Illness;Unable to participate in former interests or hobbies Chronic Illness;Unable to participate in former interests or hobbies --          Psychosocial Discharge (Final Psychosocial Re-Evaluation):  Psychosocial Re-Evaluation - 12/24/19 1138      Psychosocial Re-Evaluation   Current issues with Current Sleep Concerns;Current Stress Concerns    Comments Hannah Green states that her sleep is better. She is the kind of person that once she wakes up she is awake for the day. She is going to bed earlier to help her sleep through the night.    Expected Outcomes Short: continue going to bed early for adequate sleep. Long: maintain good sleeping habits to keep stress at a minimum.    Interventions Encouraged to attend Pulmonary Rehabilitation for the exercise    Continue Psychosocial Services  Follow up required by staff            Education: Education Goals: Education classes will be provided on a weekly basis, covering required topics. Participant will state understanding/return demonstration of topics presented.  Learning Barriers/Preferences:  Learning Barriers/Preferences - 08/07/19 1403      Learning Barriers/Preferences   Learning Barriers None    Learning Preferences None           General Pulmonary Education Topics:  Infection Prevention: - Provides verbal and written material to individual with discussion of infection control including proper hand washing and proper equipment cleaning during exercise session.   Pulmonary Rehab from 12/26/2019 in [facility] Cardiac and Pulmonary Rehab  Date 08/13/19  Educator [facility]  Instruction Review Code 1- Verbalizes Understanding      Falls Prevention: - Provides verbal and written material to individual with discussion of falls prevention and safety.   Pulmonary Rehab from 12/26/2019 in [facility] Cardiac and Pulmonary Rehab  Date 08/13/19  Educator [facility]  Instruction Review Code 1- Verbalizes Understanding      Chronic Lung Diseases: - Group verbal and written instruction to review updates, respiratory medications, advancements in procedures and treatments. Discuss use of supplemental oxygen including available portable oxygen systems, continuous and intermittent flow rates, concentrators, personal use and safety guidelines. Review proper use of inhaler and spacers. Provide informative websites for self-education.    Pulmonary Rehab from 12/26/2019 in [facility] Cardiac and Pulmonary Rehab  Date 09/19/19  Educator jh  Instruction Review Code 1- Verbalizes Understanding      Energy Conservation: - Provide group verbal and  written instruction for methods to conserve energy, plan and organize activities. Instruct on pacing techniques, use of adaptive equipment and posture/positioning to relieve shortness of breath.   Triggers and Exacerbations: - Group verbal and  written instruction to review types of environmental triggers and ways to prevent exacerbations. Discuss weather changes, air quality and the benefits of nasal washing. Review warning signs and symptoms to help prevent infections. Discuss techniques for effective airway clearance, coughing, and vibrations.   AED/CPR: - Group verbal and written instruction with the use of models to demonstrate the basic use of the AED with the basic ABC's of resuscitation.   Anatomy and Physiology of the Lungs: - Group verbal and written instruction with the use of models to provide basic lung anatomy and physiology related to function, structure and complications of lung disease.   Anatomy & Physiology of the Heart: - Group verbal and written instruction and models provide basic cardiac anatomy and physiology, with the coronary electrical and arterial systems. Review of Valvular disease and Heart Failure   Pulmonary Rehab from 12/26/2019 in [facility] Cardiac and Pulmonary Rehab  Date 12/26/19  Educator SB  Instruction Review Code 1- Verbalizes Understanding      Cardiac Medications: - Group verbal and written instruction to review commonly prescribed medications for heart disease. Reviews the medication, class of the drug, and side effects.   Pulmonary Rehab from 12/26/2019 in [facility] Cardiac and Pulmonary Rehab  Date 09/26/19  Educator SB  Instruction Review Code 1- Verbalizes Understanding      Other: -Provides group and verbal instruction on various topics (see comments)   Knowledge Questionnaire Score:  Knowledge Questionnaire Score - 08/13/19 1506      Knowledge Questionnaire Score   Pre Score 11/18 Education Focus; Pulm A&P, O2 safety            Core Components/Risk Factors/Patient Goals at Admission:  Personal Goals and Risk Factors at Admission - 08/13/19 1506      Core Components/Risk Factors/Patient Goals on Admission    Weight Management Yes;Weight Loss;Obesity    Intervention  Weight Management: Develop a combined nutrition and exercise program designed to reach desired caloric intake, while maintaining appropriate intake of nutrient and fiber, sodium and fats, and appropriate energy expenditure required for the weight goal.;Weight Management: Provide education and appropriate resources to help participant work on and attain dietary goals.;Weight Management/Obesity: Establish reasonable short term and long term weight goals.;Obesity: Provide education and appropriate resources to help participant work on and attain dietary goals.    Admit Weight 225 lb 12.8 oz (102.4 kg)    Goal Weight: Short Term 220 lb (99.8 kg)    Goal Weight: Long Term 215 lb (97.5 kg)    Expected Outcomes Long Term: Adherence to nutrition and physical activity/exercise program aimed toward attainment of established weight goal;Short Term: Continue to assess and modify interventions until short term weight is achieved;Weight Loss: Understanding of general recommendations for a balanced deficit meal plan, which promotes 1-2 lb weight loss per week and includes a negative energy balance of [phone] kcal/d;Understanding recommendations for meals to include 15-35% energy as protein, 25-35% energy from fat, 35-60% energy from carbohydrates, less than 270m of dietary cholesterol, 20-35 gm of total fiber daily;Understanding of distribution of calorie intake throughout the day with the consumption of 4-5 meals/snacks    Improve shortness of breath with ADL's Yes    Intervention Provide education, individualized exercise plan and daily activity instruction to help decrease symptoms of SOB with activities of  daily living.    Expected Outcomes Short Term: Improve cardiorespiratory fitness to achieve a reduction of symptoms when performing ADLs;Long Term: Be able to perform more ADLs without symptoms or delay the onset of symptoms    Lipids Yes    Intervention Provide education and support for participant on nutrition &  aerobic/resistive exercise along with prescribed medications to achieve LDL <75m, HDL >448m    Expected Outcomes Short Term: Participant states understanding of desired cholesterol values and is compliant with medications prescribed. Participant is following exercise prescription and nutrition guidelines.;Long Term: Cholesterol controlled with medications as prescribed, with individualized exercise RX and with personalized nutrition plan. Value goals: LDL < 7017mHDL > 40 mg.           Education:Diabetes - Individual verbal and written instruction to review signs/symptoms of diabetes, desired ranges of glucose level fasting, after meals and with exercise. Acknowledge that pre and post exercise glucose checks will be done for 3 sessions at entry of program.   Education: Know Your Numbers and Risk Factors: -Group verbal and written instruction about important numbers in your health.  Discussion of what are risk factors and how they play a role in the disease process.  Review of Cholesterol, Blood Pressure, Diabetes, and BMI and the role they play in your overall health.   [facility]  Date 11/21/19  Educator SB  Instruction Review Code 1- Verbalizes Understanding      Core Components/Risk Factors/Patient Goals Review:   Goals and Risk Factor Review    Row Name 09/03/19 1144 09/24/19 1135 11/05/19 1134 11/21/19 1126 12/24/19 1136     Core Components/Risk Factors/Patient Goals Review   Personal Goals Review Weight Management/Obesity;Improve shortness of breath with ADL's;Hypertension Weight Management/Obesity;Improve shortness of breath with ADL's;Hypertension Weight Management/Obesity;Improve shortness of breath with ADL's;Hypertension Weight Management/Obesity;Improve shortness of breath with ADL's;Hypertension Improve shortness of breath with ADL's   Review Hannah Green doing well in rehab.  Her weight is trending down.  She just doesn't  like our scale compared to hers at home. Her blood pressures are good in class and she does not check them at home.  Her doctor wants her to check it.  Her breathing is doing well. Hannah Green got back from vacation so her weight may be up.  She doesnt monitor BP at home.  She has a time scheduled to meet with RD about diet. She doesnt monitor BP at home, she has a cuff at home and will start monitoring it. She doesnt monitor BP at home, she has a cuff at home and will start monitoring it - encouraged checking it 1x/day not at rehab. Improving SOB and practicing PLB. Sometimes it is challenging for her to do her daily activities at home. She has more trouble with bending over and picking things up.Spoke to patient about their shortness of breath and what they can do to improve. Patient has been informed of breathing techniques when starting the program. Patient is informed to tell staff if they have had any med changes and that certain meds they are taking or not taking can be causing shortness of breath.   Expected Outcomes Short: Start checking blood pressures. Long: Continue to monitor risk factors. Short: neet with RD Long: manage risk factors Short: PLB anf monitor BP Long: manage risk factors Short: PLB and monitor BP Long: manage risk factors Short: Attend [facility] regularly to improve shortness of breath with ADL's. Long: maintain independence with  ADL's          Core Components/Risk Factors/Patient Goals at Discharge (Final Review):   Goals and Risk Factor Review - 12/24/19 1136      Core Components/Risk Factors/Patient Goals Review   Personal Goals Review Improve shortness of breath with ADL's    Review Sometimes it is challenging for her to do her daily activities at home. She has more trouble with bending over and picking things up.Spoke to patient about their shortness of breath and what they can do to improve. Patient has been informed of breathing techniques when starting the program.  Patient is informed to tell staff if they have had any med changes and that certain meds they are taking or not taking can be causing shortness of breath.    Expected Outcomes Short: Attend [facility] regularly to improve shortness of breath with ADL's. Long: maintain independence with ADL's           ITP Comments:  ITP Comments    Row Name 08/07/19 1413 08/13/19 1458 08/14/19 0653 08/15/19 1120 09/11/19 1050   ITP Comments Virtual Visit completed. Patient informed on EP and RD appointment and 6 Minute walk test. Patient also informed of patient health questionnaires on My Chart. Patient Verbalizes understanding. Visit diagnosis can be found in [facility] 08/05/2019. Completed 6MWT and gym orientation.  Initial ITP created and sent for review to Dr. Emily Filbert, Medical Director. 30 Day review completed. Medical Director ITP review done, changes made as directed, and signed approval by Medical Director. First full day of exercise!  Patient was oriented to gym and equipment including functions, settings, policies, and procedures.  Patient's individual exercise prescription and treatment plan were reviewed.  All starting workloads were established based on the results of the 6 minute walk test done at initial orientation visit.  The plan for exercise progression was also introduced and progression will be customized based on patient's performance and goals. 30 Day review completed. Medical Director ITP review done, changes made as directed, and signed approval by Medical Director.   Row Name 10/09/19 0629 11/06/19 1615 11/19/19 1451 12/04/19 0603 12/16/19 1544   ITP Comments 30 Day review completed. Medical Director ITP review done, changes made as directed, and signed approval by Medical Director. 30 day review completed. ITP sent to Dr. Emily Filbert, Medical Director of Cardiac and Pulmonary Rehab. Continue with ITP unless changes are made by physician. Called out today with MD appt 30 Day review completed. Medical  Director ITP review done, changes made as directed, and signed approval by Medical Director. Pt continues to call out for various reasons.  Last attended on 11/26/2019.  She has only completed 20 visits since 08/13/19 (averages around once a week).   [place] Name 12/18/19 1507 01/01/20 0708         ITP Comments Called to check on patient.  She stated that she has had a lot going on recently.  She has only attended 20 times in 18 weeks.  She was encouraged to improve her attendance to get full benefit of program.  She intends to be here tomorrow. 30 Day review completed. Medical Director ITP review done, changes made as directed, and signed approval by Medical Director.             Comments:

## 2020-01-02 ENCOUNTER — Encounter: Payer: Medicare HMO | Attending: Pulmonary Disease | Admitting: *Deleted

## 2020-01-02 ENCOUNTER — Other Ambulatory Visit: Payer: Self-pay

## 2020-01-02 DIAGNOSIS — J449 Chronic obstructive pulmonary disease, unspecified: Secondary | ICD-10-CM | POA: Diagnosis not present

## 2020-01-02 NOTE — Progress Notes (Signed)
Daily Session Note  Patient Details  Name: Hannah Green MRN: [id number] Date of Birth: Sep 03, 1945 Referring Provider:     Pulmonary Rehab from 08/13/2019 in [facility] Cardiac and Pulmonary Rehab  Referring Provider [name] DO      Encounter Date: 01/02/2020  Check In:  Session Check In - 01/02/20 1109      Check-In   Supervising physician immediately available to respond to emergencies See telemetry face sheet for immediately available ER MD    Location [facility]-Cardiac & Pulmonary Rehab    Staff Present Renita Papa, RN BSN;Joseph Hood RCP,RRT,BSRT;Melissa Burton RDN, LDN;Jessica [place], [place], RCEP, CCRP, [place], [place], ACSM CEP, Exercise Physiologist    Virtual Visit No    Medication changes reported     No    Fall or balance concerns reported    No    Warm-up and Cool-down Performed on first and last piece of equipment    Resistance Training Performed Yes    VAD Patient? No    PAD/SET Patient? No      Pain Assessment   Currently in Pain? No/denies              Social History   Tobacco Use  Smoking Status Former Smoker  . Packs/day: 1.50  . Years: 50.00  . Pack years: 75.00  . Types: Cigarettes  . Quit date: 04/01/2011  . Years since quitting: 8.7  Smokeless Tobacco Never Used  Tobacco Comment   has stopped but has started again in "crisis"    Goals Met:  Independence with exercise equipment Exercise tolerated well No report of cardiac concerns or symptoms Strength training completed today  Goals Unmet:  Not Applicable  Comments: Pt able to follow exercise prescription today without complaint.  Will continue to monitor for progression.    Dr. Emily Filbert is Medical Director for [place] and [facility].

## 2020-01-07 ENCOUNTER — Other Ambulatory Visit: Payer: Self-pay

## 2020-01-07 ENCOUNTER — Encounter: Payer: Medicare HMO | Admitting: *Deleted

## 2020-01-07 DIAGNOSIS — J449 Chronic obstructive pulmonary disease, unspecified: Secondary | ICD-10-CM | POA: Diagnosis not present

## 2020-01-07 NOTE — Progress Notes (Signed)
Daily Session Note  Patient Details  Name: Hannah Green MRN: [id number] Date of Birth: 04-Oct-1945 Referring Provider:     Pulmonary Rehab from 08/13/2019 in [facility] Cardiac and Pulmonary Rehab  Referring Provider [name] DO      Encounter Date: 01/07/2020  Check In:  Session Check In - 01/07/20 1145      Check-In   Supervising physician immediately available to respond to emergencies See telemetry face sheet for immediately available ER MD    Location [facility]-Cardiac & Pulmonary Rehab    Staff Present Heath Lark, RN, BSN, CCRP;Melissa [place] RDN, [place] Pavy, BA, ACSM CEP, Exercise Physiologist;Joseph Hood RCP,RRT,BSRT    Virtual Visit No    Medication changes reported     No    Fall or balance concerns reported    No    Warm-up and Cool-down Performed on first and last piece of equipment    Resistance Training Performed Yes    VAD Patient? No    PAD/SET Patient? No      Pain Assessment   Currently in Pain? No/denies              Social History   Tobacco Use  Smoking Status Former Smoker  . Packs/day: 1.50  . Years: 50.00  . Pack years: 75.00  . Types: Cigarettes  . Quit date: 04/01/2011  . Years since quitting: 8.7  Smokeless Tobacco Never Used  Tobacco Comment   has stopped but has started again in "crisis"    Goals Met:  Proper associated with RPD/PD & O2 Sat Independence with exercise equipment Exercise tolerated well No report of cardiac concerns or symptoms  Goals Unmet:  Not Applicable  Comments: Pt able to follow exercise prescription today without complaint.  Will continue to monitor for progression.    Dr. Emily Filbert is Medical Director for [place] and [facility].

## 2020-01-09 ENCOUNTER — Other Ambulatory Visit: Payer: Self-pay

## 2020-01-09 DIAGNOSIS — J449 Chronic obstructive pulmonary disease, unspecified: Secondary | ICD-10-CM | POA: Diagnosis not present

## 2020-01-09 NOTE — Progress Notes (Signed)
Daily Session Note  Patient Details  Name: Hannah Green MRN: [id number] Date of Birth: 09/09/1945 Referring Provider:     Pulmonary Rehab from 08/13/2019 in [facility] Card[facility] and Pulmonary Rehab  Referring Provider [name] DO      Encounter Date: 01/09/2020  Check In:  Session Check In - 01/09/20 1117      Check-In   Supervising physician immediately available to respond to emergencies See telemetry face sheet for immediately available ER MD    Location [facility]-Cardiac & Pulmonary Rehab    Staff Present Birdie Sons, MPA, RN;Joseph Alcus Dad, RN BSN;Jessica [place], [place], RCEP, CCRP, CCET    Virtual Visit No    Medication changes reported     No    Fall or balance concerns reported    No    Tobacco Cessation No Change    Warm-up and Cool-down Performed on first and last piece of equipment    Resistance Training Performed Yes    VAD Patient? No    PAD/SET Patient? No      Pain Assessment   Currently in Pain? No/denies              Social History   Tobacco Use  Smoking Status Former Smoker  . Packs/day: 1.50  . Years: 50.00  . Pack years: 75.00  . Types: Cigarettes  . Quit date: 04/01/2011  . Years since quitting: 8.7  Smokeless Tobacco Never Used  Tobacco Comment   has stopped but has started again in "crisis"    Goals Met:  Independence with exercise equipment Exercise tolerated well Personal goals reviewed No report of cardiac concerns or symptoms Strength training completed today  Goals Unmet:  Not Applicable  Comments: Pt able to follow exercise prescription today without complaint.  Will continue to monitor for progression.    Dr. Emily Filbert is Medical Director for [place] and [facility].

## 2020-01-13 ENCOUNTER — Other Ambulatory Visit: Payer: Self-pay

## 2020-01-13 ENCOUNTER — Encounter: Payer: Medicare HMO | Admitting: *Deleted

## 2020-01-13 DIAGNOSIS — J449 Chronic obstructive pulmonary disease, unspecified: Secondary | ICD-10-CM | POA: Diagnosis not present

## 2020-01-13 NOTE — Progress Notes (Signed)
Daily Session Note  Patient Details  Name: Hannah Green MRN: [id number] Date of Birth: 12/01/1945 Referring Provider:     Pulmonary Rehab from 08/13/2019 in [facility] Cardiac and Pulmonary Rehab  Referring Provider [name] DO      Encounter Date: 01/13/2020  Check In:  Session Check In - 01/13/20 0926      Check-In   Supervising physician immediately available to respond to emergencies See telemetry face sheet for immediately available ER MD    Location [facility]-Cardiac & Pulmonary Rehab    Staff Present Heath Lark, RN, BSN, CCRP;Joseph Hood RCP,RRT,BSRT;Kelly [place], [place], ACSM CEP, Exercise Physiologist    Virtual Visit No    Medication changes reported     No    Fall or balance concerns reported    No    Warm-up and Cool-down Performed on first and last piece of equipment    Resistance Training Performed Yes    VAD Patient? No    PAD/SET Patient? No      Pain Assessment   Currently in Pain? No/denies              Social History   Tobacco Use  Smoking Status Former Smoker  . Packs/day: 1.50  . Years: 50.00  . Pack years: 75.00  . Types: Cigarettes  . Quit date: 04/01/2011  . Years since quitting: 8.7  Smokeless Tobacco Never Used  Tobacco Comment   has stopped but has started again in "crisis"    Goals Met:  Proper associated with RPD/PD & O2 Sat Independence with exercise equipment Exercise tolerated well No report of cardiac concerns or symptoms  Goals Unmet:  Not Applicable  Comments: Pt able to follow exercise prescription today without complaint.  Will continue to monitor for progression.    Dr. Emily Filbert is Medical Director for [place] and [facility].

## 2020-01-14 ENCOUNTER — Other Ambulatory Visit: Payer: Self-pay

## 2020-01-14 ENCOUNTER — Encounter (HOSPITAL_COMMUNITY): Payer: Self-pay

## 2020-01-14 ENCOUNTER — Ambulatory Visit (HOSPITAL_COMMUNITY)
Admission: RE | Admit: 2020-01-14 | Discharge: 2020-01-14 | Disposition: A | Discharge status: Home / Self Care | Payer: Medicare HMO | Source: Ambulatory Visit | Attending: Internal Medicine | Admitting: Internal Medicine

## 2020-01-14 ENCOUNTER — Inpatient Hospital Stay: Payer: Medicare HMO | Attending: Internal Medicine

## 2020-01-14 DIAGNOSIS — I4891 Unspecified atrial fibrillation: Secondary | ICD-10-CM | POA: Diagnosis not present

## 2020-01-14 DIAGNOSIS — I2510 Atherosclerotic heart disease of native coronary artery without angina pectoris: Secondary | ICD-10-CM | POA: Diagnosis not present

## 2020-01-14 DIAGNOSIS — K219 Gastro-esophageal reflux disease without esophagitis: Secondary | ICD-10-CM | POA: Insufficient documentation

## 2020-01-14 DIAGNOSIS — Z9049 Acquired absence of other specified parts of digestive tract: Secondary | ICD-10-CM | POA: Diagnosis not present

## 2020-01-14 DIAGNOSIS — J449 Chronic obstructive pulmonary disease, unspecified: Secondary | ICD-10-CM | POA: Diagnosis not present

## 2020-01-14 DIAGNOSIS — E785 Hyperlipidemia, unspecified: Secondary | ICD-10-CM | POA: Insufficient documentation

## 2020-01-14 DIAGNOSIS — C3411 Malignant neoplasm of upper lobe, right bronchus or lung: Secondary | ICD-10-CM | POA: Diagnosis present

## 2020-01-14 DIAGNOSIS — M069 Rheumatoid arthritis, unspecified: Secondary | ICD-10-CM | POA: Insufficient documentation

## 2020-01-14 DIAGNOSIS — K449 Diaphragmatic hernia without obstruction or gangrene: Secondary | ICD-10-CM | POA: Diagnosis not present

## 2020-01-14 DIAGNOSIS — Z79899 Other long term (current) drug therapy: Secondary | ICD-10-CM | POA: Insufficient documentation

## 2020-01-14 DIAGNOSIS — C3490 Malignant neoplasm of unspecified part of unspecified bronchus or lung: Secondary | ICD-10-CM

## 2020-01-14 DIAGNOSIS — Z7982 Long term (current) use of aspirin: Secondary | ICD-10-CM | POA: Insufficient documentation

## 2020-01-14 DIAGNOSIS — J432 Centrilobular emphysema: Secondary | ICD-10-CM | POA: Diagnosis not present

## 2020-01-14 DIAGNOSIS — Z923 Personal history of irradiation: Secondary | ICD-10-CM | POA: Insufficient documentation

## 2020-01-14 LAB — CMP (CANCER CENTER ONLY)
ALT: 18 U/L (ref 0–44)
AST: 17 U/L (ref 15–41)
Albumin: 3.7 g/dL (ref 3.5–5.0)
Alkaline Phosphatase: 63 U/L (ref 38–126)
Anion gap: 5 (ref 5–15)
BUN: 14 mg/dL (ref 8–23)
CO2: 29 mmol/L (ref 22–32)
Calcium: 9.1 mg/dL (ref 8.9–10.3)
Chloride: 105 mmol/L (ref 98–111)
Creatinine: 0.77 mg/dL (ref 0.44–1.00)
GFR, Estimated: >60 mL/min (ref >60)
Glucose, Bld: 111 mg/dL — ABNORMAL HIGH (ref 70–99)
Potassium: 4.5 mmol/L (ref 3.5–5.1)
Sodium: 139 mmol/L (ref 135–145)
Total Bilirubin: 0.8 mg/dL (ref 0.3–1.2)
Total Protein: 7 g/dL (ref 6.5–8.1)

## 2020-01-14 LAB — CBC WITH DIFFERENTIAL (CANCER CENTER ONLY)
Abs Immature Granulocytes: 0.02 10*3/uL (ref 0.00–0.07)
Basophils Absolute: 0.1 10*3/uL (ref 0.0–0.1)
Basophils Relative: 1 %
Eosinophils Absolute: 0.1 10*3/uL (ref 0.0–0.5)
Eosinophils Relative: 2 %
HCT: 42.8 % (ref 36.0–46.0)
Hemoglobin: 14.1 g/dL (ref 12.0–15.0)
Immature Granulocytes: 0 %
Lymphocytes Relative: 30 %
Lymphs Abs: 2.4 10*3/uL (ref 0.7–4.0)
MCH: 31.1 pg (ref 26.0–34.0)
MCHC: 32.9 g/dL (ref 30.0–36.0)
MCV: 94.3 fL (ref 80.0–100.0)
Monocytes Absolute: 0.6 10*3/uL (ref 0.1–1.0)
Monocytes Relative: 8 %
Neutro Abs: 4.6 10*3/uL (ref 1.7–7.7)
Neutrophils Relative %: 59 %
Platelet Count: 331 10*3/uL (ref 150–400)
RBC: 4.54 MIL/uL (ref 3.87–5.11)
RDW: 13 % (ref 11.5–15.5)
WBC Count: 7.8 10*3/uL (ref 4.0–10.5)
nRBC: 0 % (ref 0.0–0.2)

## 2020-01-14 MED ORDER — IOHEXOL 300 MG/ML  SOLN
75.0000 mL | Freq: Once | INTRAMUSCULAR | Status: AC | PRN
  Administered 2020-01-14: 75 mL via INTRAVENOUS

## 2020-01-15 DIAGNOSIS — J449 Chronic obstructive pulmonary disease, unspecified: Secondary | ICD-10-CM | POA: Diagnosis not present

## 2020-01-15 NOTE — Progress Notes (Signed)
Daily Session Note  Patient Details  Name: Hannah Green MRN: [id number] Date of Birth: 10/25/1945 Referring Provider:     Pulmonary Rehab from 08/13/2019 in [facility] Cardiac and Pulmonary Rehab  Referring Provider Icard, Bradley DO      Encounter Date: 01/15/2020  Check In:  Session Check In - 01/15/20 0932      Check-In   Supervising physician immediately available to respond to emergencies See telemetry face sheet for immediately available ER MD    Location [facility]-Cardiac & Pulmonary Rehab    Staff Present Kelly Bollinger, MPA, RN;Amanda Sommer, BA, ACSM CEP, Exercise Physiologist;Joseph Hood RCP,RRT,BSRT    Virtual Visit No    Medication changes reported     No    Fall or balance concerns reported    No    Tobacco Cessation No Change    Warm-up and Cool-down Performed on first and last piece of equipment    Resistance Training Performed Yes    VAD Patient? No    PAD/SET Patient? No      Pain Assessment   Currently in Pain? No/denies              Social History   Tobacco Use  Smoking Status Former Smoker  . Packs/day: 1.50  . Years: 50.00  . Pack years: 75.00  . Types: Cigarettes  . Quit date: 04/01/2011  . Years since quitting: 8.7  Smokeless Tobacco Never Used  Tobacco Comment   has stopped but has started again in "crisis"    Goals Met:  Independence with exercise equipment Exercise tolerated well No report of cardiac concerns or symptoms Strength training completed today  Goals Unmet:  Not Applicable  Comments: Pt able to follow exercise prescription today without complaint.  Will continue to monitor for progression.    Dr. Mark Miller is Medical Director for [facility] and [facility]. 

## 2020-01-16 ENCOUNTER — Inpatient Hospital Stay: Payer: Medicare HMO | Admitting: Internal Medicine

## 2020-01-16 ENCOUNTER — Encounter: Payer: Self-pay | Admitting: Internal Medicine

## 2020-01-16 ENCOUNTER — Other Ambulatory Visit: Payer: Self-pay

## 2020-01-16 DIAGNOSIS — C3411 Malignant neoplasm of upper lobe, right bronchus or lung: Secondary | ICD-10-CM | POA: Diagnosis not present

## 2020-01-16 DIAGNOSIS — C3490 Malignant neoplasm of unspecified part of unspecified bronchus or lung: Secondary | ICD-10-CM

## 2020-01-16 NOTE — Progress Notes (Signed)
[place] [phone]   [phone]  OFFICE PROGRESS NOTE  Jinny Sanders, MD [place] [address]  DIAGNOSIS: Stage IB (T2a, N0, M0) non-small cell lung cancer, squamous cell carcinoma presented with right upper lobe nodule  PRIOR THERAPY:  Status post right upper and right middle lobe bilobectomy as well as lymph node dissections under the care of Dr. Roxan Hockey on April 23, 2018.  The tumor was 1.7 cm in size but there was evidence for visceropleural involvement.  CURRENT THERAPY: Observation.  INTERVAL HISTORY: Hannah Green 74 y.o. female returns to the clinic today for follow-up visit.  The patient is feeling fine today with no concerning complaints.  She denied having any current chest pain, shortness of breath, cough or hemoptysis.  She denied having any fever or chills.  She has no nausea, vomiting, diarrhea or constipation.  She denied having any headache or visual changes.  The patient denied having any weight loss or night sweats.  She had repeat CT scan of the chest performed recently and she is here for evaluation and discussion of her risk her results.   MEDICAL HISTORY: Past Medical History:  Diagnosis Date  . Breast cancer ([place]) 1999  . Cataract 2017   resolved with surgery  . COPD (chronic obstructive pulmonary disease) ([facility])   . Coronary artery disease   . GERD (gastroesophageal reflux disease)   . Hyperlipidemia   . Postoperative atrial fibrillation ([place]) 03/2018  . Rheumatoid arthritis(714.0)   . Squamous cell carcinoma of lung, stage I, right ([facility]) 2020   Status post right upper and middle lobectomy    ALLERGIES:  is allergic to adhesive [tape], neosporin [neomycin-bacitracin zn-polymyx], and penicillins.  MEDICATIONS:  Current Outpatient Medications  Medication Sig Dispense Refill  . albuterol (VENTOLIN HFA) 108 (90 Base) MCG/ACT inhaler TAKE 2 PUFFS BY MOUTH EVERY 6 HOURS AS NEEDED  FOR WHEEZE OR SHORTNESS OF BREATH 8.5 g 3  . aspirin EC 81 MG tablet Take 1 tablet (81 mg total) by mouth daily. 90 tablet 3  . atorvastatin (LIPITOR) 10 MG tablet TAKE 1 TABLET BY MOUTH EVERY DAY 90 tablet 0  . cholecalciferol (VITAMIN D3) 25 MCG (1000 UT) tablet Take 1,000 Units by mouth daily.    . Fluticasone-Umeclidin-Vilant (TRELEGY ELLIPTA) 100-62.5-25 MCG/INH AEPB Inhale 1 puff into the lungs daily. 60 each 5   No current facility-administered medications for this visit.    SURGICAL HISTORY:  Past Surgical History:  Procedure Laterality Date  . BREAST LUMPECTOMY  1999   left, needed radiation  . CATARACT EXTRACTION W/ INTRAOCULAR LENS  IMPLANT, BILATERAL Bilateral 05/19/15, 06/02/15  . CHEST TUBE INSERTION Right 04/23/2018   Procedure: Right Lateral Chest Tube Insertion;  Surgeon: Melrose Nakayama, MD;  Location: [facility];  Service: Thoracic;  Laterality: Right;  . CHOLECYSTECTOMY    . LOBECTOMY Right 04/23/2018   Procedure: RIGHT UPPER LOBECTOMY;  Surgeon: Melrose Nakayama, MD;  Location: [facility];  Service: Thoracic;  Laterality: Right;  . NM MYOVIEW LTD  01/2016   LOW RISK. Hypertensive response to exercise followed by significant drop (stage I pressure 211/70 drop to 118/75. Stage II). Apical artifact but otherwise no ischemia or infarction. EF 64%.  Marland Kitchen NODE DISSECTION Right 04/23/2018   Procedure: MEDIASTINAL NODE DISSECTION;  Surgeon: Melrose Nakayama, MD;  Location: [facility];  Service: Thoracic;  Laterality: Right;  . RIGHT/LEFT HEART CATH AND CORONARY ANGIOGRAPHY N/A 04/10/2018  Procedure: RIGHT/LEFT HEART CATH AND CORONARY ANGIOGRAPHY;  Surgeon: Leonie Man, MD;  Location: [facility];; angiographically minimal coronary artery disease.  Normal LVEDP.  Basely normal RHC pressures with no suggestion of pulmonary hypertension.  Normal CO-CI by Fick (5.9-2.8)  . TONSILLECTOMY    . TRANSTHORACIC ECHOCARDIOGRAM  11/'17; 2/'20   a) Nov 2017: Mild LVH. EF 60-65%. GR  2-DD; b) Feb 2020: Normal LV size and function.  EF 60 to 65%.  No R WMA.  GR 1 DD.  Normal RV size and function.  Normal pressures.  No valvular disease.  Marland Kitchen VIDEO ASSISTED THORACOSCOPY (VATS)/WEDGE RESECTION Right 04/23/2018   Procedure: VIDEO ASSISTED THORACOSCOPY (VATS)/WEDGE RESECTION OF RIGHT UPPER LUNG;  Surgeon: Melrose Nakayama, MD;  Location: [facility];  Service: Thoracic;  Laterality: Right;    REVIEW OF SYSTEMS:  A comprehensive review of systems was negative except for: Respiratory: positive for dyspnea on exertion   PHYSICAL EXAMINATION: General appearance: alert, cooperative and no distress Head: Normocephalic, without obvious abnormality, atraumatic Neck: no adenopathy, no JVD, supple, symmetrical, trachea midline and thyroid not enlarged, symmetric, no tenderness/mass/nodules Lymph nodes: Cervical, supraclavicular, and axillary nodes normal. Resp: clear to auscultation bilaterally Back: symmetric, no curvature. ROM normal. No CVA tenderness. Cardio: regular rate and rhythm, S1, S2 normal, no murmur, click, rub or gallop GI: soft, non-tender; bowel sounds normal; no masses,  no organomegaly Extremities: extremities normal, atraumatic, no cyanosis or edema  ECOG PERFORMANCE STATUS: 1 - Symptomatic but completely ambulatory  Blood pressure (!) 154/64, pulse 69, temperature 98.2 F (36.8 C), temperature source Tympanic, resp. rate 18, height 5\' 6"  (1.676 m), weight 233 lb 6.4 oz (105.9 kg), SpO2 98 %.  LABORATORY DATA: Lab Results  Component Value Date   WBC 7.8 01/14/2020   HGB 14.1 01/14/2020   HCT 42.8 01/14/2020   MCV 94.3 01/14/2020   PLT 331 01/14/2020      Chemistry      Component Value Date/Time   NA 139 01/14/2020 1007   NA 140 04/04/2018 1643   K 4.5 01/14/2020 1007   CL 105 01/14/2020 1007   CO2 29 01/14/2020 1007   BUN 14 01/14/2020 1007   BUN 13 04/04/2018 1643   CREATININE 0.77 01/14/2020 1007   CREATININE 0.61 06/01/2012 1452      Component  Value Date/Time   CALCIUM 9.1 01/14/2020 1007   ALKPHOS 63 01/14/2020 1007   AST 17 01/14/2020 1007   ALT 18 01/14/2020 1007   BILITOT 0.8 01/14/2020 1007       RADIOGRAPHIC STUDIES: CT Chest W Contrast  Result Date: 01/14/2020 CLINICAL DATA:  Non-small-cell lung cancer diagnosed 1/20 with right upper lobectomy. Asymptomatic. EXAM: CT CHEST WITH CONTRAST TECHNIQUE: Multidetector CT imaging of the chest was performed during intravenous contrast administration. CONTRAST:  22mL OMNIPAQUE IOHEXOL 300 MG/ML  SOLN COMPARISON:  07/10/2019. Interval CTA of the chest of 08/13/2019 is unchanged. FINDINGS: Cardiovascular: Aortic atherosclerosis. Tortuous thoracic aorta. Mild cardiomegaly with LAD coronary artery calcification. No central pulmonary embolism, on this non-dedicated study. Mediastinum/Nodes: No supraclavicular adenopathy. Right axillary node dissection. No mediastinal or hilar adenopathy. Moderate hiatal hernia is not significantly changed. Soft tissue density anterior to the left brachiocephalic vein is again favored to arise from the right sternoclavicular or sternocostal junctions including on 36/2. similar. Lungs/Pleura: No pleural fluid. Status post right upper and right middle lobectomy. Moderate centrilobular and mild paraseptal emphysema. Calcified right lower lobe granuloma. 3 mm left upper lobe pulmonary nodule on 54/5,  similar. More medial and cephalad 3 mm left upper lobe pulmonary nodule on 48/5, also unchanged. 2 mm right lower lobe pulmonary nodule on 45/5, similar. Upper Abdomen: Cholecystectomy. Normal imaged portions of the spleen, pancreas, adrenal glands, kidneys. Abdominal aortic atherosclerosis. Musculoskeletal: No acute osseous abnormality. Convex right thoracic spine curvature. IMPRESSION: 1. Status post right upper and right middle lobectomy. 2. No findings of recurrent or metastatic disease. 3. Similar bilateral pulmonary nodules, favored to be benign. Recommend attention on  follow-up. 4. Similar soft tissue density anterior to the left brachiocephalic vein, again favored to arise from the sternoclavicular or sternocostal joint capsule. 5. Aortic atherosclerosis (ICD10-I70.0), coronary artery atherosclerosis and emphysema (ICD10-J43.9). 6. Hiatal hernia. Electronically Signed   By: Abigail Miyamoto M.D.   On: 01/14/2020 19:57    ASSESSMENT AND PLAN: This is a very pleasant 74 years old white female with a stage Ib non-small cell lung cancer status post right upper and middle lobectomies with lymph node dissection in February 2020.   The patient is currently on observation and she is feeling fine today with no concerning complaints. She had repeat CT scan of the chest performed recently.  I personally and independently reviewed the scans and discussed the results with the patient today. Her scan showed no concerning findings for disease recurrence or metastasis. I recommended for her to continue on observation with repeat CT scan of the chest in 6 months. She was advised to call immediately if she has any concerning symptoms in the interval. The patient voices understanding of current disease status and treatment options and is in agreement with the current care plan. All questions were answered. The patient knows to call the clinic with any problems, questions or concerns. We can certainly see the patient much sooner if necessary.   Disclaimer: This note was dictated with voice recognition software. Similar sounding words can inadvertently be transcribed and may not be corrected upon review.

## 2020-01-24 ENCOUNTER — Telehealth: Payer: Self-pay | Admitting: Internal Medicine

## 2020-01-24 NOTE — Telephone Encounter (Signed)
Scheduled per los. Called and left msg. Mailed printout  °

## 2020-01-28 ENCOUNTER — Ambulatory Visit: Payer: Medicare HMO

## 2020-01-29 ENCOUNTER — Encounter: Payer: Self-pay | Admitting: *Deleted

## 2020-01-29 DIAGNOSIS — J449 Chronic obstructive pulmonary disease, unspecified: Secondary | ICD-10-CM

## 2020-01-29 NOTE — Progress Notes (Signed)
Pulmonary Individual Treatment Plan  Patient Details  Name: Hannah Green MRN: [id number] Date of Birth: [birth year] Referring Provider:     Pulmonary Rehab from 08/13/2019 in [facility] Cardiac and Pulmonary Rehab  Referring Provider [name] DO      Initial Encounter Date:    Pulmonary Rehab from 08/13/2019 in [facility] Cardiac and Pulmonary Rehab  Date 08/13/19      Visit Diagnosis: Stage 3 severe COPD by GOLD classification ([place])  Patient's Home Medications on Admission:  Current Outpatient Medications:    albuterol (VENTOLIN HFA) 108 (90 Base) MCG/ACT inhaler, TAKE 2 PUFFS BY MOUTH EVERY 6 HOURS AS NEEDED FOR WHEEZE OR SHORTNESS OF BREATH, Disp: 8.5 g, Rfl: 3   aspirin EC 81 MG tablet, Take 1 tablet (81 mg total) by mouth daily., Disp: 90 tablet, Rfl: 3   atorvastatin (LIPITOR) 10 MG tablet, TAKE 1 TABLET BY MOUTH EVERY DAY, Disp: 90 tablet, Rfl: 0   cholecalciferol (VITAMIN D3) 25 MCG (1000 UT) tablet, Take 1,000 Units by mouth daily., Disp: , Rfl:    Fluticasone-Umeclidin-Vilant (TRELEGY ELLIPTA) 100-62.5-25 MCG/INH AEPB, Inhale 1 puff into the lungs daily., Disp: 44 each, Rfl: 5  Past Medical History: Past Medical History:  Diagnosis Date   Breast cancer ([place]) 1999   Cataract 2017   resolved with surgery   COPD (chronic obstructive pulmonary disease) (HCC)    Coronary artery disease    GERD (gastroesophageal reflux disease)    Hyperlipidemia    Postoperative atrial fibrillation ([place]) 03/2018   Rheumatoid arthritis(714.0)    Squamous cell carcinoma of lung, stage I, right ([place]) 2020   Status post right upper and middle lobectomy    Tobacco Use: Social History   Tobacco Use  Smoking Status Former Smoker   Packs/day: 1.50   Years: 50.00   Pack years: 75.00   Types: Cigarettes   Quit date: 04/01/2011   Years since quitting: 8.8  Smokeless Tobacco Never Used  Tobacco Comment   has stopped but has started again in "crisis"    Labs: Recent  Review Flowsheet Data    Labs for ITP Cardiac and Pulmonary Rehab Latest Ref Rng & Units 04/10/2018 04/19/2018 04/23/2018 04/24/2018 12/11/2018   Cholestrol 0 - 200 mg/dL - - - - 218(H)   LDLCALC 0 - 99 mg/dL - - - - 127(H)   LDLDIRECT mg/dL - - - - -   HDL >39.00 mg/dL - - - - 57.90   Trlycerides 0 - 149 mg/dL - - - - 165.0(H)   Hemoglobin A1c 4.6 - 6.5 % - - - - 5.9   PHART 7.35 - 7.45 - TEST WILL BE CREDITED 7.425 7.354 -   PCO2ART 32 - 48 mmHg - TEST WILL BE CREDITED 41.1 48.4(H) -   HCO3 20.0 - 28.0 mmol/L 29.0(H) TEST WILL BE CREDITED 26.5 26.3 -   TCO2 22 - 32 mmol/L 31 - - - -   ACIDBASEDEF 0.0 - 2.0 mmol/L - TEST WILL BE CREDITED - - -   O2SAT % 76.0 TEST WILL BE CREDITED 97.7 98.6 -       Pulmonary Assessment Scores:  Pulmonary Assessment Scores    Row Name 08/13/19 1507         ADL UCSD   ADL Phase Entry     SOB Score total 57     Rest 1     Walk 3     Stairs 4     Bath 0     Dress 0  Shop 4       CAT Score   CAT Score 21       mMRC Score   mMRC Score 3            UCSD: Self-administered rating of dyspnea associated with activities of daily living (ADLs) 6-point scale (0 = "not at all" to 5 = "maximal or unable to do because of breathlessness")  Scoring Scores range from 0 to 120.  Minimally important difference is 5 units  CAT: CAT can identify the health impairment of COPD patients and is better correlated with disease progression.  CAT has a scoring range of zero to 40. The CAT score is classified into four groups of low (less than 10), medium (10 - 20), high (21-30) and very high (31-40) based on the impact level of disease on health status. A CAT score over 10 suggests significant symptoms.  A worsening CAT score could be explained by an exacerbation, poor medication adherence, poor inhaler technique, or progression of COPD or comorbid conditions.  CAT MCID is 2 points  mMRC: mMRC (Modified Medical Research Council) Dyspnea Scale is used to assess  the degree of baseline functional disability in patients of respiratory disease due to dyspnea. No minimal important difference is established. A decrease in score of 1 point or greater is considered a positive change.   Pulmonary Function Assessment:  Pulmonary Function Assessment - 08/07/19 1410      Breath   Shortness of Breath Yes;Limiting activity           Exercise Target Goals: Exercise Program Goal: Individual exercise prescription set using results from initial 6 min walk test and THRR while considering  patients activity barriers and safety.   Exercise Prescription Goal: Initial exercise prescription builds to 30-45 minutes a day of aerobic activity, 2-3 days per week.  Home exercise guidelines will be given to patient during program as part of exercise prescription that the participant will acknowledge.  Education: Aerobic Exercise: - Group verbal and visual presentation on the components of exercise prescription. Introduces F.I.T.T principle from ACSM for exercise prescriptions.  Reviews F.I.T.T. principles of aerobic exercise including progression. Written material given at graduation.   Pulmonary Rehab from 01/15/2020 in [facility] Cardiac and Pulmonary Rehab  Date 12/26/19  Hannah Green only on 10/28]  Educator [facility]  Instruction Review Code 1- [employer] Understanding      Education: Resistance Exercise: - Group verbal and visual presentation on the components of exercise prescription. Introduces F.I.T.T principle from ACSM for exercise prescriptions  Reviews F.I.T.T. principles of resistance exercise including progression. Written material given at graduation.    Education: Exercise & Equipment Safety: - Individual verbal instruction and demonstration of equipment use and safety with use of the equipment.   Pulmonary Rehab from 01/15/2020 in [facility] Cardiac and Pulmonary Rehab  Date 08/13/19  Educator [facility]  Instruction Review Code 1- Verbalizes Understanding       Education: Exercise Physiology & General Exercise Guidelines: - Group verbal and written instruction with models to review the exercise physiology of the cardiovascular system and associated critical values. Provides general exercise guidelines with specific guidelines to those with heart or lung disease.    Education: Flexibility, Balance, Mind/Body Relaxation: - Group verbal and visual presentation with interactive activity on the components of exercise prescription. Introduces F.I.T.T principle from ACSM for exercise prescriptions. Reviews F.I.T.T. principles of flexibility and balance exercise training including progression. Also discusses the mind body connection.  Reviews various relaxation techniques to help reduce and  manage stress (i.e. Deep breathing, progressive muscle relaxation, and visualization). Balance handout provided to take home. Written material given at graduation.   Pulmonary Rehab from 01/15/2020 in [facility] Cardiac and Pulmonary Rehab  Date 01/02/20  Educator [facility]  Instruction Review Code 1- Verbalizes Understanding      Activity Barriers & Risk Stratification:  Activity Barriers & Cardiac Risk Stratification - 08/13/19 1501      Activity Barriers & Cardiac Risk Stratification   Activity Barriers Deconditioning;Joint Problems;Muscular Weakness;Shortness of Breath;Balance Concerns   r knee pain          6 Minute Walk:  6 Minute Walk    Row Name 08/13/19 1459         6 Minute Walk   Phase Initial     Distance 1035 feet     Walk Time 5 minutes     # of Rest Breaks 1  1 min     MPH 2.35     METS 2.32     RPE 13     Perceived Dyspnea  3     VO2 Peak 8.11     Symptoms Yes (comment)     Comments SOB, knee pain (5/10)     Resting HR 70 bpm     Resting BP 132/74     Resting Oxygen Saturation  98 %     Exercise Oxygen Saturation  during 6 min walk 94 %     Max Ex. HR 108 bpm     Max Ex. BP 204/70     2 Minute Post BP 160/80       Interval HR   1 Minute  HR 94     2 Minute HR 108     3 Minute HR 102     4 Minute HR 102  seated     5 Minute HR 79     6 Minute HR 100     2 Minute Post HR 71     Interval Heart Rate? Yes       Interval Oxygen   Interval Oxygen? Yes     Baseline Oxygen Saturation % 98 %     1 Minute Oxygen Saturation % 97 %     1 Minute Liters of Oxygen 0 L  Room Air     2 Minute Oxygen Saturation % 94 %     2 Minute Liters of Oxygen 0 L     3 Minute Oxygen Saturation % 96 %     3 Minute Liters of Oxygen 0 L     4 Minute Oxygen Saturation % 95 %     4 Minute Liters of Oxygen 0 L     5 Minute Oxygen Saturation % 97 %     5 Minute Liters of Oxygen 0 L     6 Minute Oxygen Saturation % 96 %     6 Minute Liters of Oxygen 0 L     2 Minute Post Oxygen Saturation % 98 %     2 Minute Post Liters of Oxygen 0 L           Oxygen Initial Assessment:  Oxygen Initial Assessment - 08/07/19 1409      Home Oxygen   Home Oxygen Device None    Sleep Oxygen Prescription None    Home Exercise Oxygen Prescription None    Home Resting Oxygen Prescription None    Compliance with Home Oxygen Use Yes      Initial 6 min  Walk   Oxygen Used None      Program Oxygen Prescription   Program Oxygen Prescription None      Intervention   Short Term Goals To learn and exhibit compliance with exercise, home and travel O2 prescription;To learn and understand importance of monitoring SPO2 with pulse oximeter and demonstrate accurate use of the pulse oximeter.;To learn and understand importance of maintaining oxygen saturations>88%;To learn and demonstrate proper pursed lip breathing techniques or other breathing techniques.;To learn and demonstrate proper use of respiratory medications    Long  Term Goals Exhibits compliance with exercise, home and travel O2 prescription;Verbalizes importance of monitoring SPO2 with pulse oximeter and return demonstration;Maintenance of O2 saturations>88%;Exhibits proper breathing techniques, such as pursed lip  breathing or other method taught during program session;Compliance with respiratory medication;Demonstrates proper use of MDIs           Oxygen Re-Evaluation:  Oxygen Re-Evaluation    Row Name 08/15/19 1121 09/03/19 1156 09/24/19 1150 11/21/19 1124 12/24/19 1134     Program Oxygen Prescription   Program Oxygen Prescription _0      Home Oxygen   Home Oxygen Device _1    Sleep Oxygen Prescription _2    Home Exercise Oxygen Prescription _3    Home Resting Oxygen Prescription _4    Compliance with Home Oxygen Use Yes Yes Yes Yes --     Goals/Expected Outcomes   Short Term Goals To learn and exhibit compliance with exercise, home and travel O2 prescription;To learn and understand importance of monitoring SPO2 with pulse oximeter and demonstrate accurate use of the pulse oximeter.;To learn and understand importance of maintaining oxygen saturations>88%;To learn and demonstrate proper pursed lip breathing techniques or other breathing techniques.;To learn and demonstrate proper use of respiratory medications To learn and exhibit compliance with exercise, home and travel O2 prescription;To learn and understand importance of monitoring SPO2 with pulse oximeter and demonstrate accurate use of the pulse oximeter.;To learn and understand importance of maintaining oxygen saturations>88%;To learn and demonstrate proper pursed lip breathing techniques or other breathing techniques.;To learn and demonstrate proper use of respiratory medications To learn and exhibit compliance with exercise, home and travel O2 prescription;To learn and understand importance of monitoring SPO2 with pulse oximeter and demonstrate accurate use of the pulse oximeter.;To learn and understand importance of maintaining oxygen saturations>88%;To learn and demonstrate proper pursed lip breathing techniques or other breathing  techniques.;To learn and demonstrate proper use of respiratory medications To learn and exhibit compliance with exercise, home and travel O2 prescription;To learn and understand importance of monitoring SPO2 with pulse oximeter and demonstrate accurate use of the pulse oximeter.;To learn and understand importance of maintaining oxygen saturations>88%;To learn and demonstrate proper pursed lip breathing techniques or other breathing techniques.;To learn and demonstrate proper use of respiratory medications To learn and demonstrate proper use of respiratory medications   Long  Term Goals Exhibits compliance with exercise, home and travel O2 prescription;Verbalizes importance of monitoring SPO2 with pulse oximeter and return demonstration;Maintenance of O2 saturations>88%;Exhibits proper breathing techniques, such as pursed lip breathing or other method taught during program session;Compliance with respiratory medication;Demonstrates proper use of MDIs Exhibits compliance with exercise, home and travel O2 prescription;Verbalizes importance of monitoring SPO2 with pulse oximeter and return demonstration;Maintenance of O2 saturations>88%;Exhibits proper breathing techniques, such as pursed lip breathing or other method taught during program session;Compliance with respiratory medication;Demonstrates proper use of MDIs Exhibits compliance with exercise, home  and travel O2 prescription;Verbalizes importance of monitoring SPO2 with pulse oximeter and return demonstration;Maintenance of O2 saturations>88%;Exhibits proper breathing techniques, such as pursed lip breathing or other method taught during program session;Compliance with respiratory medication;Demonstrates proper use of MDIs Exhibits compliance with exercise, home and travel O2 prescription;Verbalizes importance of monitoring SPO2 with pulse oximeter and return demonstration;Maintenance of O2 saturations>88%;Exhibits proper breathing techniques, such as  pursed lip breathing or other method taught during program session;Compliance with respiratory medication;Demonstrates proper use of MDIs Demonstrates proper use of MDIs;Compliance with respiratory medication   Comments Reviewed PLB technique with pt.  Talked about how it works and it's importance in maintaining their exercise saturations. Hannah Green is using her PLB and finds it helpful. She does not have a pulse oximeter so we talked about getting one.  She will look into it. Hannah Green plans to add a day of exercise in addition to LW class.  Staff reviewed importance of checking O2 during exercise. Staff reviewed importance of checking O2 during exercise, encouraged to take back out her pulse ox. Practices PLB and reports getting better with it. Hannah Green has no questions about her respiratory medications and how to use them. She is taking her medications as prescribed.   Goals/Expected Outcomes Short: Become more profiecient at using PLB.   Long: Become independent at using PLB. Short: Get pulse oximeter.  Long: Continue to work on PLB. Short: monitor O2 when exercising at home Long: use PLB as needed Short: monitor O2 when exercising at home Long: use PLB as needed Short: continue with medication regimine and ask any questions she may have about them. Long: maintain respiratory medications independently   [place] Name 01/09/20 1115             Program Oxygen Prescription   Program Oxygen Prescription None         Home Oxygen   Home Oxygen Device None       Sleep Oxygen Prescription None       Home Exercise Oxygen Prescription None       Home Resting Oxygen Prescription None       Compliance with Home Oxygen Use Yes         Goals/Expected Outcomes   Short Term Goals To learn and demonstrate proper pursed lip breathing techniques or other breathing techniques.       Long  Term Goals Exhibits proper breathing techniques, such as pursed lip breathing or other method taught during program session        Comments Diaphragmatic and PLB breathing explained and performed with patient. Patient has a better understanding of how to do these exercises to help with breathing performance and relaxation. Patient performed breathing techniques adequately and to practice further at home.       Goals/Expected Outcomes Short: practice PLB and diaphragmatic breathing at home. Long: Use PLB and diaphragmatic breathing independently post [employer].              Oxygen Discharge (Final Oxygen Re-Evaluation):  Oxygen Re-Evaluation - 01/09/20 1115      Program Oxygen Prescription   Program Oxygen Prescription None      Home Oxygen   Home Oxygen Device None    Sleep Oxygen Prescription None    Home Exercise Oxygen Prescription None    Home Resting Oxygen Prescription None    Compliance with Home Oxygen Use Yes      Goals/Expected Outcomes   Short Term Goals To learn and demonstrate proper pursed lip breathing techniques or other breathing  techniques.    Long  Term Goals Exhibits proper breathing techniques, such as pursed lip breathing or other method taught during program session    Comments Diaphragmatic and PLB breathing explained and performed with patient. Patient has a better understanding of how to do these exercises to help with breathing performance and relaxation. Patient performed breathing techniques adequately and to practice further at home.    Goals/Expected Outcomes Short: practice PLB and diaphragmatic breathing at home. Long: Use PLB and diaphragmatic breathing independently post [employer].           Initial Exercise Prescription:  Initial Exercise Prescription - 08/13/19 1500      Date of Initial Exercise RX and Referring Provider   Date 08/13/19    Referring Provider [name] DO      Treadmill   MPH 1.7    Grade 0.5    Minutes 15    METs 2.42      NuStep   Level 1    SPM 80    Minutes 15    METs 2      REL-XR   Level 1    Speed 50    Minutes 15    METs 2       Prescription Details   Frequency (times per week) 2    Duration Progress to 30 minutes of continuous aerobic without signs/symptoms of physical distress      Intensity   THRR 40-80% of Max Heartrate 100-131    Ratings of Perceived Exertion 11-13    Perceived Dyspnea 0-4      Progression   Progression Continue to progress workloads to maintain intensity without signs/symptoms of physical distress.      Resistance Training   Training Prescription Yes    Weight 3 lb    Reps 10-15           Perform Capillary Blood Glucose checks as needed.  Exercise Prescription Changes:  Exercise Prescription Changes    Row Name 08/13/19 1500 08/27/19 1000 09/10/19 1300 09/24/19 1300 10/08/19 1300     Response to Exercise   Blood Pressure (Admit) 132/74 130/60 146/70 114/66 130/60   Blood Pressure (Exercise) 204/70  rck 168/80 136/70 148/68 142/68 164/72   Blood Pressure (Exit) 126/70 132/62 122/60 110/60 122/54   Heart Rate (Admit) 70 bpm 71 bpm 69 bpm 65 bpm 73 bpm   Heart Rate (Exercise) 108 bpm 108 bpm 97 bpm 87 bpm 103 bpm   Heart Rate (Exit) 78 bpm 80 bpm 80 bpm 79 bpm 77 bpm   Oxygen Saturation (Admit) 98 % 95 % 96 % 96 % 96 %   Oxygen Saturation (Exercise) 94 % 94 % 94 % 94 % 94 %   Oxygen Saturation (Exit) 94 % 97 % 98 % 93 % 96 %   Rating of Perceived Exertion (Exercise) _0 Perceived Dyspnea (Exercise) _1 Symptoms SOB, R knee pain 5/10 none none none none   Comments walk test results second full day of exercise -- -- --   Duration -- Progress to 30 minutes of  aerobic without signs/symptoms of physical distress Continue with 30 min of aerobic exercise without signs/symptoms of physical distress. Continue with 30 min of aerobic exercise without signs/symptoms of physical distress. Continue with 30 min of aerobic exercise without signs/symptoms of physical distress.   Intensity -- THRR unchanged THRR unchanged THRR unchanged THRR unchanged     Progression  Progression -- Continue to progress workloads to maintain intensity without signs/symptoms of physical distress. Continue to progress workloads to maintain intensity without signs/symptoms of physical distress. Continue to progress workloads to maintain intensity without signs/symptoms of physical distress. Continue to progress workloads to maintain intensity without signs/symptoms of physical distress.   Average METs -- 2.5 2.5 2.75 2.5     Resistance Training   Training Prescription -- Yes Yes Yes Yes   Weight -- 3 lb 3 lb 3 lb 3 lb   Reps -- 10-15 10-15 10-15 10-15     Interval Training   Interval Training -- No No No No     Treadmill   MPH -- 1.7 1.9 1.9 1.8   Grade -- 0.5 0.5 0.5 0.5   Minutes -- _0 METs -- 2.42 2.6 2.59 2.59     NuStep   Level -- _1 SPM -- -- 80 -- 80   Minutes -- _2 METs -- 2.5 2.4 2.9 2.4     REL-XR   Level -- 1 -- 1 --   Minutes -- 15 -- 15 --     Home Exercise Plan   Plans to continue exercise at -- -- -- Home (comment)  walking, also considering joining [employer] (comment)  walking, also considering joining [employer] -- -- -- Add 2 additional days to program exercise sessions. Add 2 additional days to program exercise sessions.   Initial Home Exercises Provided -- -- -- 09/24/19 09/24/19   Row Name 10/22/19 1300 11/05/19 1300 11/19/19 1400 12/02/19 1000 12/30/19 1100     Response to Exercise   Blood Pressure (Admit) 128/64 122/66 126/62 132/60 120/58   Blood Pressure (Exercise) 150/62 146/50 154/60 160/80 172/62   Blood Pressure (Exit) 112/64 136/56 122/70 106/60 138/60   Heart Rate (Admit) 74 bpm 72 bpm 70 bpm 73 bpm 65 bpm   Heart Rate (Exercise) 106 bpm 107 bpm 89 bpm 108 bpm 107 bpm   Heart Rate (Exit) 80 bpm 84 bpm 80 bpm 78 bpm 86 bpm   Oxygen Saturation (Admit) 96 % 96 % 94 % 98 % 95 %   Oxygen Saturation (Exercise) 94 % 95 % 96 % 96 % 95 %   Oxygen Saturation (Exit) 97 % 98 % 97 % 95 % 90 %    Rating of Perceived Exertion (Exercise) _3 Perceived Dyspnea (Exercise) _4 Symptoms none none none -- none   Duration Continue with 30 min of aerobic exercise without signs/symptoms of physical distress. Continue with 30 min of aerobic exercise without signs/symptoms of physical distress. Continue with 30 min of aerobic exercise without signs/symptoms of physical distress. Continue with 30 min of aerobic exercise without signs/symptoms of physical distress. Continue with 30 min of aerobic exercise without signs/symptoms of physical distress.   Intensity _5      Progression   Progression Continue to progress workloads to maintain intensity without signs/symptoms of physical distress. Continue to progress workloads to maintain intensity without signs/symptoms of physical distress. Continue to progress workloads to maintain intensity without signs/symptoms of physical distress. Continue to progress workloads to maintain intensity without signs/symptoms of physical distress. Continue to progress workloads to maintain intensity without signs/symptoms of physical distress.   Average METs 2.96 2.4 2.89 3.2 2.8  Resistance Training   Training Prescription _0    Weight 3 lb 3 lb 3 lb 3 lb 3 lb   Reps 10-15 10-15 10-15 10-15 10-15     Interval Training   Interval Training _1      Treadmill   MPH 1._2 Grade 0.5 0.5 0.5 0.5 0.5   Minutes _3 METs 2.59 2.67 2.67 2.67 2.67     NuStep   Level _4 SPM -- 80 -- 80 80   Minutes _5 METs 3.3 2.1 3 3.7 3     Biostep-RELP   Level 1 -- 1 -- --   Minutes 15 -- 15 -- --   METs 3 -- 3 -- --     Home Exercise Plan   Plans to continue exercise at Home (comment)  walking, also considering joining [employer] -- Home (comment)  walking, also considering joining [employer] -- Home (comment)   walking, also considering joining [employer] Add 2 additional days to program exercise sessions. -- Add 2 additional days to program exercise sessions. -- Add 2 additional days to program exercise sessions.   Initial Home Exercises Provided 09/24/19 -- 09/24/19 -- 09/24/19   Row Name 01/13/20 1100             Response to Exercise   Blood Pressure (Admit) 136/78       Blood Pressure (Exercise) 128/64       Blood Pressure (Exit) 124/76       Heart Rate (Admit) 66 bpm       Heart Rate (Exercise) 105 bpm       Heart Rate (Exit) 76 bpm       Oxygen Saturation (Admit) 95 %       Oxygen Saturation (Exercise) 93 %       Oxygen Saturation (Exit) 91 %       Rating of Perceived Exertion (Exercise) 13       Perceived Dyspnea (Exercise) 2       Symptoms none       Duration Continue with 30 min of aerobic exercise without signs/symptoms of physical distress.       Intensity THRR unchanged         Progression   Progression Continue to progress workloads to maintain intensity without signs/symptoms of physical distress.       Average METs 2.83         Resistance Training   Training Prescription Yes       Weight 3 lb       Reps 10-15         Interval Training   Interval Training No         Treadmill   MPH 1.8       Grade 0.5       Minutes 15       METs 2.5         NuStep   Level 4       Minutes 15       METs 3         Biostep-RELP   Level 2       Minutes 15       METs 3         Home Exercise Plan   Plans to continue exercise at Home (comment)  walking, also considering joining [employer] Add 2 additional days to program exercise sessions.       Initial Home Exercises Provided 09/24/19              Exercise Comments:   Exercise Goals and Review:  Exercise Goals    Row Name 08/13/19 1505             Exercise Goals   Increase Physical Activity Yes       Intervention Provide advice, education, support and counseling about physical  activity/exercise needs.;Develop an individualized exercise prescription for aerobic and resistive training based on initial evaluation findings, risk stratification, comorbidities and participant's personal goals.       Expected Outcomes Short Term: Attend rehab on a regular basis to increase amount of physical activity.;Long Term: Add in home exercise to make exercise part of routine and to increase amount of physical activity.;Long Term: Exercising regularly at least 3-5 days a week.       Increase Strength and Stamina Yes       Intervention Provide advice, education, support and counseling about physical activity/exercise needs.;Develop an individualized exercise prescription for aerobic and resistive training based on initial evaluation findings, risk stratification, comorbidities and participant's personal goals.       Expected Outcomes Short Term: Increase workloads from initial exercise prescription for resistance, speed, and METs.;Short Term: Perform resistance training exercises routinely during rehab and add in resistance training at home;Long Term: Improve cardiorespiratory fitness, muscular endurance and strength as measured by increased METs and functional capacity (6MWT)       Able to understand and use rate of perceived exertion (RPE) scale Yes       Intervention Provide education and explanation on how to use RPE scale       Expected Outcomes Short Term: Able to use RPE daily in rehab to express subjective intensity level;Long Term:  Able to use RPE to guide intensity level when exercising independently       Able to understand and use Dyspnea scale Yes       Intervention Provide education and explanation on how to use Dyspnea scale       Expected Outcomes Short Term: Able to use Dyspnea scale daily in rehab to express subjective sense of shortness of breath during exertion;Long Term: Able to use Dyspnea scale to guide intensity level when exercising independently       Knowledge and  understanding of Target Heart Rate Range (THRR) Yes       Intervention Provide education and explanation of THRR including how the numbers were predicted and where they are located for reference       Expected Outcomes Short Term: Able to state/look up THRR;Short Term: Able to use daily as guideline for intensity in rehab;Long Term: Able to use THRR to govern intensity when exercising independently       Able to check pulse independently Yes       Intervention Review the importance of being able to check your own pulse for safety during independent exercise;Provide education and demonstration on how to check pulse in carotid and radial arteries.       Expected Outcomes Short Term: Able to explain why pulse checking is important during independent exercise;Long Term: Able to check pulse independently and accurately       Understanding of Exercise Prescription Yes       Intervention Provide education, explanation, and written materials on patient's individual exercise  prescription       Expected Outcomes Short Term: Able to explain program exercise prescription;Long Term: Able to explain home exercise prescription to exercise independently              Exercise Goals Re-Evaluation :  Exercise Goals Re-Evaluation    Row Name 08/15/19 1121 08/27/19 1054 09/03/19 1142 09/10/19 1305 09/24/19 1145     Exercise Goal Re-Evaluation   Exercise Goals Review Increase Physical Activity;Able to understand and use rate of perceived exertion (RPE) scale;Knowledge and understanding of Target Heart Rate Range (THRR);Understanding of Exercise Prescription;Increase Strength and Stamina;Able to understand and use Dyspnea scale;Able to check pulse independently Increase Physical Activity;Understanding of Exercise Prescription;Increase Strength and Stamina Increase Physical Activity;Understanding of Exercise Prescription;Increase Strength and Stamina Increase Physical Activity;Understanding of Exercise  Prescription;Increase Strength and Stamina Increase Physical Activity;Understanding of Exercise Prescription;Increase Strength and Stamina   Comments Reviewed RPE and dyspnea scales, THR and program prescription with pt today.  Pt voiced understanding and was given a copy of goals to take home. Hannah Green is off to a good start in rehab.  She has completed her first two full days of exercise She is already up to level 4 on the NuStep.  We will continue to monitor her progress. Hannah Green is doing well in rehab.  She is not a fan on the XR. We will look at moving her around.  She is planning to walk and swim while out on vacation next week. Hannah Green has increased her speed on TM.  Staff will follow up with her when she returns from vacation. Reviewed home exercise with pt today.  Pt plans to walk/staff videos for exercise.  Reviewed THR, pulse, RPE, sign and symptoms, pulse oximetery and when to call 911 or MD.  Also discussed weather considerations and indoor options.  Pt voiced understanding.   Expected Outcomes Short: Use RPE daily to regulate intensity. Long: Follow program prescription in THR. Short:Continue to attend regularly Long: Continue to follow program prescription Short: Walk and swimmon vacation Long; Continue to improve stamina. Short: stay active on vacation Long: increase MET level Short: add one day of exercise in addition to program sessions Long:  increase stamina   Row Name 09/24/19 1335 10/08/19 1340 10/22/19 1349 11/05/19 1121 11/19/19 1452     Exercise Goal Re-Evaluation   Exercise Goals Review Increase Physical Activity;Understanding of Exercise Prescription;Increase Strength and Stamina Increase Physical Activity;Understanding of Exercise Prescription;Increase Strength and Stamina Increase Physical Activity;Understanding of Exercise Prescription;Increase Strength and Stamina Increase Physical Activity;Understanding of Exercise Prescription;Increase Strength and Stamina Increase Physical  Activity;Understanding of Exercise Prescription;Increase Strength and Stamina   Comments Hannah Green is doing well in rehab.  She is now up to level 4 onte NuStep.  We will continue to monitor her progress. Hannah Green works at [employer] and reaches THR range during some of her sessions.  Staff will encourage increasing workloads slightly to maintain THR during session. Hannah Green has been doing well in rehab. She is now up to level 4 on the NuSep.  She is still on level 1 for BioStep, so we will encourage her to increase that workload.  We will continue to monitor her progress. Treadmill - 20 minutes 1x/week - 31mh. Discussed adding in weighted exercises and 150 minutes of cardio exercise. Hannah Green to have problems attending rehab consistently.  She was out all last week and called out again today.  Progression would be improved with better attendance.   Expected Outcomes Short: Start to slowly bring up workloads  Long: Continue to improve stamina. Short:  increase workloads Long: improve MET level Short: Increase workload on BioStep. Long: Continue to improve stamina. Short: Increase home exercise Long: Continue to improve stamina. Short: Improved attendance for rehab  Long: Continue to increase activity levels   Row Name 11/21/19 1127 12/02/19 1032 12/16/19 1545 12/24/19 1141 12/30/19 1132     Exercise Goal Re-Evaluation   Exercise Goals Review Increase Physical Activity;Understanding of Exercise Prescription;Increase Strength and Stamina Increase Physical Activity;Understanding of Exercise Prescription;Increase Strength and Stamina -- Increase Physical Activity;Increase Strength and Stamina Increase Physical Activity;Increase Strength and Stamina   Comments Hannah Green continues to have problems attending rehab consistently. Progression would be improved with better attendance. Hannah Green is exercising at home - treadmill and resistance learned during rehab - walks on the treadmill 20-30 minutes 2x/week, resistance every day.  Hannah Green has increased MET level.  Staff will review correct THR/oxygen range for her exercise at home. Out since last review.  Last attended on 11/26/19.  Average attendance is around once a week with 20 visits in 18 weeks. She was out of Rehab due to three friends passing away. Hannah Green is now ready to get back on track with exercise. Hannah Green has been inconsistent with attendance.  She plans to get back consistently.   Expected Outcomes Short: Improve attendance for rehab, continue to exercise at home, consistently use treadmill for 30 minutes Long: Continue to increase activity levels Short: continue exercise at home, attend consistently Long: increase overall stamina Short: Return to consistent attendance  Long: Exercise at home Short: resume regular attendance in Section. Long: graduate [place]. Short: attend LW consistently Long: improve stamina   Row Name 01/09/20 1131 01/13/20 1119           Exercise Goal Re-Evaluation   Exercise Goals Review Increase Physical Activity;Increase Strength and Stamina Increase Physical Activity;Increase Strength and Stamina;Understanding of Exercise Prescription      Comments Hannah Green stretches at home but states she is not doing enough to keep her heart rate up. She has a treadmill that she can use at home. She is going to try to use her treadmill more when the holidays are over. Hannah Green came in early today to make up for an appointment that would take her out this week.  She is up to 2.6 METs on the NuStep.  We will continue to encourage improved attendance to continue to see progression.      Expected Outcomes Short: get back into working out at home. Long: add more days to working out at home Short: Continue to attend consistently  Long: Continue to improve stamina             Discharge Exercise Prescription (Final Exercise Prescription Changes):  Exercise Prescription Changes - 01/13/20 1100      Response to Exercise   Blood Pressure (Admit) 136/78    Blood  Pressure (Exercise) 128/64    Blood Pressure (Exit) 124/76    Heart Rate (Admit) 66 bpm    Heart Rate (Exercise) 105 bpm    Heart Rate (Exit) 76 bpm    Oxygen Saturation (Admit) 95 %    Oxygen Saturation (Exercise) 93 %    Oxygen Saturation (Exit) 91 %    Rating of Perceived Exertion (Exercise) 13    Perceived Dyspnea (Exercise) 2    Symptoms none    Duration Continue with 30 min of aerobic exercise without signs/symptoms of physical distress.    Intensity THRR unchanged      Progression  Progression Continue to progress workloads to maintain intensity without signs/symptoms of physical distress.    Average METs 2.83      Resistance Training   Training Prescription Yes    Weight 3 lb    Reps 10-15      Interval Training   Interval Training No      Treadmill   MPH 1.8    Grade 0.5    Minutes 15    METs 2.5      NuStep   Level 4    Minutes 15    METs 3      Biostep-RELP   Level 2    Minutes 15    METs 3      Home Exercise Plan   Plans to continue exercise at Home (comment)   walking, also considering joining [employer]   Frequency Add 2 additional days to program exercise sessions.    Initial Home Exercises Provided 09/24/19           Nutrition:  Target Goals: Understanding of nutrition guidelines, daily intake of sodium <1551m, cholesterol <2021m calories 30% from fat and 7% or less from saturated fats, daily to have 5 or more servings of fruits and vegetables.  Education: All About Nutrition: -Group instruction provided by verbal, written material, interactive activities, discussions, models, and posters to present general guidelines for heart healthy nutrition including fat, fiber, MyPlate, the role of sodium in heart healthy nutrition, utilization of the nutrition label, and utilization of this knowledge for meal planning. Follow up email sent as well. Written material given at graduation.   [facility]  Date 01/09/20  [facility] Code 1- Verbalizes Understanding      Biometrics:  Pre Biometrics - 08/13/19 1505      Pre Biometrics   Height 5' 6.75" (1.695 m)    Weight 225 lb 12.8 oz (102.4 kg)    BMI (Calculated) 35.65    Single Leg Stand 10.94 seconds            Nutrition Therapy Plan and Nutrition Goals:  Nutrition Therapy & Goals - 10/03/19 1130      Nutrition Therapy   Diet Pulmonary MNT, heart healthy eating    Drug/Food Interactions Statins/Certain Fruits    Protein (specify units) 80g    Fiber 25 grams    Whole Grain Foods 3 servings    Saturated Fats 12 max. grams    Fruits and Vegetables 5 servings/day    Sodium 1.5 grams      Personal Nutrition Goals   Nutrition Goal ST: add granola to ice cream, eat sweet potatos as a snack, add protein source to snack or breakfast LT: improve breathing (now 2/10)    Comments B: tries to eat when she gets up. An egg (soft boiled) toast or or english muffin (whole wheat) - butter. Coffee or hot tea (sugar free french vanilla creamer). S: L: tuna (mustard and green olives) with crackers (wheat thins (reduced fat)). Diet green tea. D: salmon, no red meat, some portk, mostly chicken or fish (baked). asparagus, lots of salad with other nonstarchy vegetables, baked vegetables (olive oil) S: loves to bake, ice cream Drinks: diet green tea and water Discussed heart healthy eaitng, pulmonary MNT, discussed adding one more good source of protein. Pt reports loving sweet potatos and would like to have granola on her ice cream (will find a healthy version)      Intervention  Plan   Intervention Prescribe, educate and counsel regarding individualized specific dietary modifications aiming towards targeted core components such as weight, hypertension, lipid management, diabetes, heart failure and other comorbidities.;Nutrition handout(s) given to patient.    Expected Outcomes Short Term Goal: Understand basic principles  of dietary content, such as calories, fat, sodium, cholesterol and nutrients.;Short Term Goal: A plan has been developed with personal nutrition goals set during dietitian appointment.;Long Term Goal: Adherence to prescribed nutrition plan.           Nutrition Assessments:  Nutrition Assessments - 08/13/19 1506      MEDFICTS Scores   Pre Score 49          MEDIFICTS Score Key:  ?70 Need to make dietary changes   40-70 Heart Healthy Diet  ? 40 Therapeutic Level Cholesterol Diet   Picture Your Plate Scores:  <19 Unhealthy dietary pattern with much room for improvement.  41-50 Dietary pattern unlikely to meet recommendations for good health and room for improvement.  51-60 More healthful dietary pattern, with some room for improvement.   >60 Healthy dietary pattern, although there may be some specific behaviors that could be improved.   Nutrition Goals Re-Evaluation:  Nutrition Goals Re-Evaluation    [place] Name 09/03/19 1157 11/05/19 1135 11/21/19 1115 12/24/19 1143 01/09/20 1127     Goals   Current Weight -- -- -- 226 lb (102.5 kg) 233 lb (105.7 kg)   Nutrition Goal Meet with dietician ST: continue current changes LT: improve breathing (now 3/10) ST: continue current changes, reduce sugar LT: improve breathing (now 3/10) Lose weight and watch sugar intake. Eat less sugar and lose weight   Comment Hannah Green has already to started to make some healthier changes in her diet and seeing the difference in her weight and how she is feeling. She tries new recipes and has a heart healthy diet. continue current chnages. Hannah Green reports doing well with her nutrition and is still trying to cut back on sugar, discussed restirciton and cravings - she still wants to quit cold [place]. Reports still eating well and would like to continue with her current changes. She finds it hard not to eat ice cream. She eats ice cream about twice a week from every night. She does not want to be a diabetic. Hannah Green is  going to try to eat less sugar and try to cut down on her portion sizes.   Expected Outcome Short: Meet with dietcian Long: Continue to work on healthy changes. ST: continue current changes LT: improve breathing (now 3/10) ST: continue current changes, reduce sugar LT: improve breathing (now 4/10) Short: cut out more sugar in her diet. Long: maintain diet independently that works for her. Short: reduce sugar intake. Long:lose 5 pounds in three weeks.          Nutrition Goals Discharge (Final Nutrition Goals Re-Evaluation):  Nutrition Goals Re-Evaluation - 01/09/20 1127      Goals   Current Weight 233 lb (105.7 kg)    Nutrition Goal Eat less sugar and lose weight    Comment Hannah Green is going to try to eat less sugar and try to cut down on her portion sizes.    Expected Outcome Short: reduce sugar intake. Long:lose 5 pounds in three weeks.           Psychosocial: Target Goals: Acknowledge presence or absence of significant depression and/or stress, maximize coping skills, provide positive support system. Participant is able to verbalize types and ability to use techniques and skills  needed for reducing stress and depression.   Education: Stress, Anxiety, and Depression - Group verbal and visual presentation to define topics covered.  Reviews how body is impacted by stress, anxiety, and depression.  Also discusses healthy ways to reduce stress and to treat/manage anxiety and depression.  Written material given at graduation.   Education: Sleep Hygiene -Provides group verbal and written instruction about how sleep can affect your health.  Define sleep hygiene, discuss sleep cycles and impact of sleep habits. Review good sleep hygiene tips.    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      Family Dynamics   Good Support System? Yes    Comments She can look to her son for support. Patient is not able to sleep well.      Barriers   Psychosocial barriers to participate in program The patient should benefit from training in stress management and relaxation.      Screening Interventions   Interventions Encouraged to exercise;To provide support and resources with identified psychosocial needs;Provide feedback about the scores to participant    Expected Outcomes Short Term goal: Utilizing psychosocial counselor, staff and physician to assist with identification of specific Stressors or current issues interfering with healing process. Setting desired goal for each stressor or current issue identified.;Long Term Goal: Stressors or current issues are controlled or eliminated.;Short Term goal: Identification and review with participant of any Quality of Life or Depression concerns found by scoring the questionnaire.;Long Term goal: The participant improves quality of Life and PHQ9 Scores as seen by post scores and/or verbalization of changes           Quality of Life Scores:  Scores of 19 and below usually indicate a poorer quality of life in these areas.  A difference of  2-3 points is a clinically meaningful difference.  A difference of 2-3 points in the total score of the Quality of Life Index has been associated with significant improvement in overall quality of life, self-image, physical symptoms, and general health in studies assessing change in quality of life.  PHQ-9: Recent Review Flowsheet Data    Depression screen [facility] 2/9 01/09/2020 08/13/2019 12/11/2018 12/05/2017 11/24/2016   Decreased  Interest 0 0 0 0 1   Down, Depressed, Hopeless 0 0 0 1 0   PHQ - 2 Score 0 0 0 1 1   Altered sleeping 3 1 0 2 0   Tired, decreased energy 3 2 0 1 2   Change in appetite 2 2 0 2 2   Feeling bad or failure about yourself  0 0 0 1 1   Trouble concentrating 0 0 0 0 0   Moving slowly or fidgety/restless 0 0 0 0 0   Suicidal thoughts 0 0 0 0 0   PHQ-9 Score 8 5 0 7 6   Difficult doing work/chores Somewhat difficult Somewhat difficult Not difficult at all Not difficult at all Not difficult at all     Interpretation of  Total Score  Total Score Depression Severity:  1-4 = Minimal depression, 5-9 = Mild depression, 10-14 = Moderate depression, 15-19 = Moderately severe depression, 20-27 = Severe depression   Psychosocial Evaluation and Intervention:  Psychosocial Evaluation - 08/07/19 1405      Psychosocial Evaluation & Interventions   Interventions Encouraged to exercise with the program and follow exercise prescription    Comments 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 She can look to her son for support. Patient is not able to sleep well.    Expected Outcomes Short: Continue to exercise regularly to support mental health and notify staff of any changes. Long: maintain mental health and well being through teaching of rehab or prescribed medications independently.    Continue Psychosocial Services  Follow up required by staff           Psychosocial Re-Evaluation:  Psychosocial Re-Evaluation    [place] Name 09/03/19 1146 09/24/19 1138 11/05/19 1125 11/21/19 1122 12/24/19 1138     Psychosocial Re-Evaluation   Current issues with Current Stress Concerns;Current Sleep Concerns -- Current Stress Concerns;Current Sleep Concerns Current Sleep Concerns Current Sleep Concerns;Current Stress Concerns   Comments Demia is doing well in rehab.  She is going to the beach for the week next week to [facility]. She is looking forward to her trip to just get away for a few  days.  She continues to have trouble sleeping, so I sent her information to review. Hannah Green still doesnt sleep well.  She has trouble going back to sleep if she gets up.  Other than sleep, she feels good mentally. Her family gives her stress and her plumbing has been backed up which is causing her stress. Hannah Green is still has trouble sleeping and thinks she may need a new mattress in addition to stress. Discussed sleep hygiene. Gwenette rpeorts her stress is better, but reports her sleep is poor. She reports having to wake up to go to the bathroom. Darline states that her sleep is better. She is the kind of person that once she wakes up she is awake for the day. She is going to bed earlier to help her sleep through the night.   Expected Outcomes Short: Review sleep info and enjoy vacation Long; Continue to stay positive. Short: continue to work on sleeping better Long: maintain positive outlook Short: continue to work on sleeping better Long: maintain positive outlook Short: continue to work on sleeping better, ask PCP about pelvic floor therapy to help with frequent urination Long: maintain positive outlook Short: continue going to bed early for adequate sleep. Long: maintain good sleeping habits to keep stress at a minimum.   Interventions Encouraged to attend Pulmonary Rehabilitation for the exercise;Stress management education -- Encouraged to attend Pulmonary Rehabilitation for the exercise;Stress management education Encouraged to attend Pulmonary Rehabilitation for the exercise Encouraged to attend Pulmonary Rehabilitation for the exercise   Continue Psychosocial Services  Follow up required by staff -- Follow up required by staff Follow up required by staff Follow up required by staff   Comments -- -- working on stress and sleep management sleep management --     Initial Review   Source of Stress Concerns -- -- Chronic Illness;Unable to participate in former interests or hobbies Chronic Illness;Unable to  participate in former interests or hobbies --   [place] Name 01/09/20 1121             Psychosocial Re-Evaluation   Current issues  with Current Stress Concerns;Current Sleep Concerns       Comments Reviewed patient health questionnaire (PHQ-9) with patient for follow up. Previously, patients score indicated signs/symptoms of depression.  Reviewed to see if patient is improving symptom wise while in program.  Score declined and patient states that it is because she has not been sleeping well and has been eating too much. She is going to start eating less and not so late.       Expected Outcomes Short: Continue to work toward an improvement in [place] scores by attending [facility] regularly. Long: Continue to improve stress and depression coping skills by talking with staff and attending [facility] regularly and work toward a positive mental state.       Interventions Encouraged to attend Pulmonary Rehabilitation for the exercise       Continue Psychosocial Services  Follow up required by staff              Psychosocial Discharge (Final Psychosocial Re-Evaluation):  Psychosocial Re-Evaluation - 01/09/20 1121      Psychosocial Re-Evaluation   Current issues with Current Stress Concerns;Current Sleep Concerns    Comments Reviewed patient health questionnaire (PHQ-9) with patient for follow up. Previously, patients score indicated signs/symptoms of depression.  Reviewed to see if patient is improving symptom wise while in program.  Score declined and patient states that it is because she has not been sleeping well and has been eating too much. She is going to start eating less and not so late.    Expected Outcomes Short: Continue to work toward an improvement in [place] scores by attending [facility] regularly. Long: Continue to improve stress and depression coping skills by talking with staff and attending [facility] regularly and work toward a positive mental state.    Interventions  Encouraged to attend Pulmonary Rehabilitation for the exercise    Continue Psychosocial Services  Follow up required by staff           Education: Education Goals: Education classes will be provided on a weekly basis, covering required topics. Participant will state understanding/return demonstration of topics presented.  Learning Barriers/Preferences:  Learning Barriers/Preferences - 08/07/19 1403      Learning Barriers/Preferences   Learning Barriers None    Learning Preferences None           General Pulmonary Education Topics:  Infection Prevention: - Provides verbal and written material to individual with discussion of infection control including proper hand washing and proper equipment cleaning during exercise session.   Pulmonary Rehab from 01/15/2020 in [facility] Cardiac and Pulmonary Rehab  Date 08/13/19  Educator [facility]  Instruction Review Code 1- Verbalizes Understanding      Falls Prevention: - Provides verbal and written material to individual with discussion of falls prevention and safety.   Pulmonary Rehab from 01/15/2020 in [facility] Cardiac and Pulmonary Rehab  Date 08/13/19  Educator [facility]  Instruction Review Code 1- Verbalizes Understanding      Chronic Lung Disease Review: - Group verbal instruction with posters, models, PowerPoint presentations and videos,  to review new updates, new respiratory medications, new advancements in procedures and treatments. Providing information on websites and "800" numbers for continued self-education. Includes information about supplement oxygen, available portable oxygen systems, continuous and intermittent flow rates, oxygen safety, concentrators, and Medicare reimbursement for oxygen. Explanation of Pulmonary Drugs, including class, frequency, complications, importance of spacers, rinsing mouth after steroid MDI's, and proper cleaning methods for nebulizers. Review of basic lung anatomy and physiology related to function, structure,  and complications of lung disease. Review of risk factors. Discussion about methods for diagnosing sleep apnea and types of masks and machines for OSA. Includes a review of the use of types of environmental controls: home humidity, furnaces, filters, dust mite/pet prevention, HEPA vacuums. Discussion about weather changes, air quality and the benefits of nasal washing. Instruction on Warning signs, infection symptoms, calling MD promptly, preventive modes, and value of vaccinations. Review of effective airway clearance, coughing and/or vibration techniques. Emphasizing that all should Create an Action Plan. Written material given at graduation.   Pulmonary Rehab from 01/15/2020 in [facility] Cardiac and Pulmonary Rehab  Date 09/19/19  Educator jh  Instruction Review Code 1- Verbalizes Understanding      AED/CPR: - Group verbal and written instruction with the use of models to demonstrate the basic use of the AED with the basic ABC's of resuscitation.    Anatomy and Cardiac Procedures: - Group verbal and visual presentation and models provide information about basic cardiac anatomy and function. Reviews the testing methods done to diagnose heart disease and the outcomes of the test results. Describes the treatment choices: Medical Management, Angioplasty, or Coronary Bypass Surgery for treating various heart conditions including Myocardial Infarction, Angina, Valve Disease, and Cardiac Arrhythmias.  Written material given at graduation.   Pulmonary Rehab from 01/15/2020 in [facility] Cardiac and Pulmonary Rehab  Date 12/26/19  Educator SB  Instruction Review Code 1- Verbalizes Understanding      Medication Safety: - Group verbal and visual instruction to review commonly prescribed medications for heart and lung disease. Reviews the medication, class of the drug, and side effects. Includes the steps to properly store meds and maintain the prescription regimen.  Written material given at graduation.    Pulmonary Rehab from 01/15/2020 in [facility] Cardiac and Pulmonary Rehab  Date 01/15/20  Educator SB  Instruction Review Code 1- Verbalizes Understanding      Other: -Provides group and verbal instruction on various topics (see comments)   Knowledge Questionnaire Score:  Knowledge Questionnaire Score - 08/13/19 1506      Knowledge Questionnaire Score   Pre Score 11/18 Education Focus; Pulm A&P, O2 safety            Core Components/Risk Factors/Patient Goals at Admission:  Personal Goals and Risk Factors at Admission - 08/13/19 1506      Core Components/Risk Factors/Patient Goals on Admission    Weight Management Yes;Weight Loss;Obesity    Intervention Weight Management: Develop a combined nutrition and exercise program designed to reach desired caloric intake, while maintaining appropriate intake of nutrient and fiber, sodium and fats, and appropriate energy expenditure required for the weight goal.;Weight Management: Provide education and appropriate resources to help participant work on and attain dietary goals.;Weight Management/Obesity: Establish reasonable short term and long term weight goals.;Obesity: Provide education and appropriate resources to help participant work on and attain dietary goals.    Admit Weight 225 lb 12.8 oz (102.4 kg)    Goal Weight: Short Term 220 lb (99.8 kg)    Goal Weight: Long Term 215 lb (97.5 kg)    Expected Outcomes Long Term: Adherence to nutrition and physical activity/exercise program aimed toward attainment of established weight goal;Short Term: Continue to assess and modify interventions until short term weight is achieved;Weight Loss: Understanding of general recommendations for a balanced deficit meal plan, which promotes 1-2 lb weight loss per week and includes a negative energy balance of [phone] kcal/d;Understanding recommendations for meals to include 15-35% energy as protein, 25-35% energy from fat,  35-60% energy from carbohydrates, less than  274m of dietary cholesterol, 20-35 gm of total fiber daily;Understanding of distribution of calorie intake throughout the day with the consumption of 4-5 meals/snacks    Improve shortness of breath with ADL's Yes    Intervention Provide education, individualized exercise plan and daily activity instruction to help decrease symptoms of SOB with activities of daily living.    Expected Outcomes Short Term: Improve cardiorespiratory fitness to achieve a reduction of symptoms when performing ADLs;Long Term: Be able to perform more ADLs without symptoms or delay the onset of symptoms    Lipids Yes    Intervention Provide education and support for participant on nutrition & aerobic/resistive exercise along with prescribed medications to achieve LDL <780m HDL >4047m   Expected Outcomes Short Term: Participant states understanding of desired cholesterol values and is compliant with medications prescribed. Participant is following exercise prescription and nutrition guidelines.;Long Term: Cholesterol controlled with medications as prescribed, with individualized exercise RX and with personalized nutrition plan. Value goals: LDL < 34m64mDL > 40 mg.           Education:Diabetes - Individual verbal and written instruction to review signs/symptoms of diabetes, desired ranges of glucose level fasting, after meals and with exercise. Acknowledge that pre and post exercise glucose checks will be done for 3 sessions at entry of program.   Know Your Numbers and Heart Failure: - Group verbal and visual instruction to discuss disease risk factors for cardiac and pulmonary disease and treatment options.  Reviews associated critical values for Overweight/Obesity, Hypertension, Cholesterol, and Diabetes.  Discusses basics of heart failure: signs/symptoms and treatments.  Introduces Heart Failure Zone chart for action plan for heart failure.  Written material given at graduation.   Core Components/Risk Factors/Patient  Goals Review:   Goals and Risk Factor Review    Row Name 09/03/19 1144 09/24/19 1135 11/05/19 1134 11/21/19 1126 12/24/19 1136     Core Components/Risk Factors/Patient Goals Review   Personal Goals Review Weight Management/Obesity;Improve shortness of breath with ADL's;Hypertension Weight Management/Obesity;Improve shortness of breath with ADL's;Hypertension Weight Management/Obesity;Improve shortness of breath with ADL's;Hypertension Weight Management/Obesity;Improve shortness of breath with ADL's;Hypertension Improve shortness of breath with ADL's   Review SandDonidoing well in rehab.  Her weight is trending down.  She just doesn't like our scale compared to hers at home. Her blood pressures are good in class and she does not check them at home.  Her doctor wants her to check it.  Her breathing is doing well. SandZuriyaht got back from vacation so her weight may be up.  She doesnt monitor BP at home.  She has a time scheduled to meet with RD about diet. She doesnt monitor BP at home, she has a cuff at home and will start monitoring it. She doesnt monitor BP at home, she has a cuff at home and will start monitoring it - encouraged checking it 1x/day not at rehab. Improving SOB and practicing PLB. Sometimes it is challenging for her to do her daily activities at home. She has more trouble with bending over and picking things up.Spoke to patient about their shortness of breath and what they can do to improve. Patient has been informed of breathing techniques when starting the program. Patient is informed to tell staff if they have had any med changes and that certain meds they are taking or not taking can be causing shortness of breath.   Expected Outcomes Short: Start checking blood pressures. Long: Continue to  monitor risk factors. Short: neet with RD Long: manage risk factors Short: PLB anf monitor BP Long: manage risk factors Short: PLB and monitor BP Long: manage risk factors Short: Attend [facility]  regularly to improve shortness of breath with ADLs. Long: maintain independence with ADLs   [place] Name 01/09/20 1117             Core Components/Risk Factors/Patient Goals Review   Personal Goals Review Improve shortness of breath with ADL's       Review Spoke to patient about their shortness of breath and what they can do to improve. Patient has been informed of breathing techniques when starting the program. Patient is informed to tell staff if they have had any med changes and that certain meds they are taking or not taking can be causing shortness of breath.       Expected Outcomes Short: Attend [facility] regularly to improve shortness of breath with ADLs. Long: maintain independence with ADLs              Core Components/Risk Factors/Patient Goals at Discharge (Final Review):   Goals and Risk Factor Review - 01/09/20 1117      Core Components/Risk Factors/Patient Goals Review   Personal Goals Review Improve shortness of breath with ADL's    Review Spoke to patient about their shortness of breath and what they can do to improve. Patient has been informed of breathing techniques when starting the program. Patient is informed to tell staff if they have had any med changes and that certain meds they are taking or not taking can be causing shortness of breath.    Expected Outcomes Short: Attend [facility] regularly to improve shortness of breath with ADLs. Long: maintain independence with ADLs           ITP Comments:  ITP Comments    Row Name 08/07/19 1413 08/13/19 1458 08/14/19 0653 08/15/19 1120 09/11/19 1050   ITP Comments Virtual Visit completed. Patient informed on EP and RD appointment and 6 Minute walk test. Patient also informed of patient health questionnaires on My Chart. Patient Verbalizes understanding. Visit diagnosis can be found in [facility] 08/05/2019. Completed 6MWT and gym orientation.  Initial ITP created and sent for review to Dr. Emily Filbert, Medical Director. 30 Day  review completed. Medical Director ITP review done, changes made as directed, and signed approval by Medical Director. First full day of exercise!  Patient was oriented to gym and equipment including functions, settings, policies, and procedures.  Patient's individual exercise prescription and treatment plan were reviewed.  All starting workloads were established based on the results of the 6 minute walk test done at initial orientation visit.  The plan for exercise progression was also introduced and progression will be customized based on patient's performance and goals. 30 Day review completed. Medical Director ITP review done, changes made as directed, and signed approval by Medical Director.   [place] Name 10/09/19 0629 11/06/19 1615 11/19/19 1451 12/04/19 0603 12/16/19 1544   ITP Comments 30 Day review completed. Medical Director ITP review done, changes made as directed, and signed approval by Medical Director. 30 day review completed. ITP sent to Dr. Emily Filbert, Medical Director of Cardiac and Pulmonary Rehab. Continue with ITP unless changes are made by physician. Called out today with MD appt 30 Day review completed. Medical Director ITP review done, changes made as directed, and signed approval by Medical Director. Pt continues to call out for various reasons.  Last attended on 11/26/2019.  She has  only completed 20 visits since 08/13/19 (averages around once a week).   [place] Name 12/18/19 1507 01/01/20 0708 01/29/20 1027       ITP Comments Called to check on patient.  She stated that she has had a lot going on recently.  She has only attended 20 times in 18 weeks.  She was encouraged to improve her attendance to get full benefit of program.  She intends to be here tomorrow. 30 Day review completed. Medical Director ITP review done, changes made as directed, and signed approval by Medical Director. 30 Day review completed. Medical Director ITP review done, changes made as directed, and signed approval by  Medical Director.            Comments:

## 2020-01-30 ENCOUNTER — Ambulatory Visit: Payer: Medicare HMO

## 2020-01-31 ENCOUNTER — Ambulatory Visit
Admission: EM | Admit: 2020-01-31 | Discharge: 2020-01-31 | Disposition: A | Discharge status: Home / Self Care | Payer: Medicare HMO | Attending: Emergency Medicine | Admitting: Emergency Medicine

## 2020-01-31 DIAGNOSIS — J0100 Acute maxillary sinusitis, unspecified: Secondary | ICD-10-CM

## 2020-01-31 DIAGNOSIS — J069 Acute upper respiratory infection, unspecified: Secondary | ICD-10-CM | POA: Diagnosis not present

## 2020-01-31 MED ORDER — AZITHROMYCIN 250 MG PO TABS: 250.0000 mg | ORAL_TABLET | Freq: Every day | ORAL | 0 refills | Status: DC

## 2020-01-31 NOTE — ED Provider Notes (Signed)
Hannah Green    CSN: [id number] Arrival date & time: 01/31/20  0175      History   Chief Complaint Chief Complaint  Patient presents with  . Nasal Congestion  . Headache    HPI Hannah Green is a 74 y.o. female.   Patient presents with 3 to 4-day history of nasal congestion, ear pain, headache, postnasal drip, sinus pressure, cough productive of green phlegm.  She states her symptoms started after a [place] to out of [place].  She denies fever, chills, unusual shortness of breath, vomiting, diarrhea, or other symptoms.  OTC treatment attempted at home.  Her medical history includes breast cancer, lung cancer, stage III severe COPD, diastolic dysfunction, atrial fibrillation, hyperlipidemia, rheumatoid arthritis, prediabetes, GERD.  The history is provided by the patient and medical records.    Past Medical History:  Diagnosis Date  . Breast cancer ([place]) 1999  . Cataract 2017   resolved with surgery  . COPD (chronic obstructive pulmonary disease) ([place])   . Coronary artery disease   . GERD (gastroesophageal reflux disease)   . Hyperlipidemia   . Postoperative atrial fibrillation ([place]) 03/2018  . Rheumatoid arthritis(714.0)   . Squamous cell carcinoma of lung, stage I, right ([place]) 2020   Status post right upper and middle lobectomy    Patient Active Problem List   Diagnosis Date Noted  . Left-sided chest wall pain 08/29/2019  . Pain and swelling of right lower leg 08/29/2019  . Acute neck pain 12/25/2018  . Goals of care, counseling/discussion 06/12/2018  . Stage I squamous cell carcinoma of right lung ([place]) 06/12/2018  . HCAP (healthcare-associated pneumonia) 05/08/2018  . Non-small cell lung cancer ([place]) 04/24/2018  . S/P Right Upper Lobectomy, Right Middle Lobectomy Lung 04/23/2018  . Pain and swelling of wrist, right 04/18/2018  . Exertional chest pain 04/04/2018  . DOE (dyspnea on exertion) 04/04/2018  . Pulmonary nodule 03/05/2018  . Prediabetes  01/08/2018  . COPD exacerbation ([place]) 04/27/2017  . COPD (chronic obstructive pulmonary disease) ([place]) 12/07/2016  . 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 Osteopenia of femoral neck 01/12/2016  . Rosacea 12/22/2015  . Constipation, chronic 11/11/2014  . Basal cell carcinoma of left side of nose 06/01/2012  . Routine general medical examination at a health care facility 06/02/2011  . GERD (gastroesophageal reflux disease)   . Vitamin D deficiency 02/13/2008  . ADENOCARCINOMA, BREAST 02/08/2008  . Hyperlipidemia with target LDL less than 100 02/08/2008  . RHEUMATOID ARTHRITIS 02/08/2008    Past Surgical History:  Procedure Laterality Date  . BREAST LUMPECTOMY  1999   left, needed radiation  . CATARACT EXTRACTION W/ INTRAOCULAR LENS  IMPLANT, BILATERAL Bilateral 05/19/15, 06/02/15  . CHEST TUBE INSERTION Right 04/23/2018   Procedure: Right Lateral Chest Tube Insertion;  Surgeon: Melrose Nakayama, MD;  Location: [facility];  Service: Thoracic;  Laterality: Right;  . CHOLECYSTECTOMY    . LOBECTOMY Right 04/23/2018   Procedure: RIGHT UPPER LOBECTOMY;  Surgeon: Melrose Nakayama, MD;  Location: [facility];  Service: Thoracic;  Laterality: Right;  . NM MYOVIEW LTD  01/2016   LOW RISK. Hypertensive response to exercise followed by significant drop (stage I pressure 211/70 drop to 118/75. Stage II). Apical artifact but otherwise no ischemia or infarction. EF 64%.  Marland Kitchen NODE DISSECTION Right 04/23/2018   Procedure: MEDIASTINAL NODE DISSECTION;  Surgeon: Melrose Nakayama, MD;  Location: [facility];  Service: Thoracic;  Laterality: Right;  . RIGHT/LEFT HEART CATH AND CORONARY ANGIOGRAPHY  N/A 04/10/2018   Procedure: RIGHT/LEFT HEART CATH AND CORONARY ANGIOGRAPHY;  Surgeon: Leonie Man, MD;  Location: [facility];; angiographically minimal coronary artery disease.  Normal LVEDP.  Basely normal RHC pressures with no suggestion of pulmonary hypertension.  Normal CO-CI by Fick  (5.9-2.8)  . TONSILLECTOMY    . TRANSTHORACIC ECHOCARDIOGRAM  11/'17; 2/'20   a) Nov 2017: Mild LVH. EF 60-65%. GR 2-DD; b) Feb 2020: Normal LV size and function.  EF 60 to 65%.  No R WMA.  GR 1 DD.  Normal RV size and function.  Normal pressures.  No valvular disease.  Marland Kitchen VIDEO ASSISTED THORACOSCOPY (VATS)/WEDGE RESECTION Right 04/23/2018   Procedure: VIDEO ASSISTED THORACOSCOPY (VATS)/WEDGE RESECTION OF RIGHT UPPER LUNG;  Surgeon: Melrose Nakayama, MD;  Location: [facility];  Service: Thoracic;  Laterality: Right;    OB History   No obstetric history on file.      Home Medications    Prior to Admission medications   Medication Sig Start Date End Date Taking? Authorizing Provider  albuterol (VENTOLIN HFA) 108 (90 Base) MCG/ACT inhaler TAKE 2 PUFFS BY MOUTH EVERY 6 HOURS AS NEEDED FOR WHEEZE OR SHORTNESS OF BREATH 09/16/19   Magdalen Spatz, NP  aspirin EC 81 MG tablet Take 1 tablet (81 mg total) by mouth daily. 04/04/18   Leonie Man, MD  atorvastatin (LIPITOR) 10 MG tablet TAKE 1 TABLET BY MOUTH EVERY DAY 12/09/19   Bedsole, Amy E, MD  azithromycin (ZITHROMAX) 250 MG tablet Take 1 tablet (250 mg total) by mouth daily. Take first 2 tablets together, then 1 every day until finished. 01/31/20   Sharion Balloon, NP  cholecalciferol (VITAMIN D3) 25 MCG (1000 UT) tablet Take 1,000 Units by mouth daily.    [provider]  Fluticasone-Umeclidin-Vilant (TRELEGY ELLIPTA) 100-62.5-25 MCG/INH AEPB Inhale 1 puff into the lungs daily. 09/24/19   Magdalen Spatz, NP    Family History Family History  Problem Relation Age of Onset  . Cancer Mother        breast  . Cancer Father        lung cancer  . Cancer Sister        throat cancer  . Diabetes Paternal Uncle   . Diabetes Maternal Grandmother     Social History Social History   Tobacco Use  . Smoking status: Former Smoker    Packs/day: 1.50    Years: 50.00    Pack years: 75.00    Types: Cigarettes    Quit date: 04/01/2011     Years since quitting: 8.8  . Smokeless tobacco: Never Used  . Tobacco comment: has stopped but has started again in "crisis"  Vaping Use  . Vaping Use: Never used  Substance Use Topics  . Alcohol use: Yes    Alcohol/week: 2.0 standard drinks    Types: 2 Glasses of wine per week    Comment: monthly  . Drug use: No     Allergies   Adhesive [tape], Neosporin [neomycin-bacitracin zn-polymyx], and Penicillins   Review of Systems Review of Systems  Constitutional: Negative for chills and fever.  HENT: Positive for congestion, ear pain, postnasal drip, rhinorrhea, sinus pressure and sore throat.   Eyes: Negative for pain and visual disturbance.  Respiratory: Positive for cough. Negative for shortness of breath.   Cardiovascular: Negative for chest pain and palpitations.  Gastrointestinal: Negative for abdominal pain, diarrhea and vomiting.  Genitourinary: Negative for dysuria and hematuria.  Musculoskeletal: Negative for arthralgias  and back pain.  Skin: Negative for color change and rash.  Neurological: Negative for seizures and syncope.  All other systems reviewed and are negative.    Physical Exam Triage Vital Signs ED Triage Vitals  Enc Vitals Group     BP      Pulse      Resp      Temp      Temp src      SpO2      Weight      Height      Head Circumference      Peak Flow      Pain Score      Pain Loc      Pain Edu?      Excl. in [place]?    No data found.  Updated Vital Signs BP 132/60 (BP Location: Right Arm)   Pulse 74   Temp 99.1 F (37.3 C) (Oral)   Resp 20   Ht 5' 6.5" (1.689 m)   Wt 220 lb (99.8 kg)   LMP  (LMP Unknown)   SpO2 95%   BMI 34.98 kg/m   Visual Acuity Right Eye Distance:   Left Eye Distance:   Bilateral Distance:    Right Eye Near:   Left Eye Near:    Bilateral Near:     Physical Exam Vitals and nursing note reviewed.  Constitutional:      General: She is not in acute distress.    Appearance: She is well-developed.  HENT:      Head: Normocephalic and atraumatic.     Right Ear: Tympanic membrane normal.     Left Ear: Tympanic membrane normal.     Nose: Congestion present.     Mouth/Throat:     Mouth: Mucous membranes are moist.     Pharynx: Oropharynx is clear.  Eyes:     Conjunctiva/sclera: Conjunctivae normal.  Cardiovascular:     Rate and Rhythm: Normal rate and regular rhythm.     Heart sounds: Normal heart sounds.  Pulmonary:     Effort: Pulmonary effort is normal. No respiratory distress.     Breath sounds: Normal breath sounds.  Abdominal:     Palpations: Abdomen is soft.     Tenderness: There is no abdominal tenderness.  Musculoskeletal:     Cervical back: Neck supple.  Skin:    General: Skin is warm and dry.     Findings: No rash.  Neurological:     General: No focal deficit present.     Mental Status: She is alert and oriented to person, place, and time.     Gait: Gait normal.  Psychiatric:        Mood and Affect: Mood normal.        Behavior: Behavior normal.      UC Treatments / Results  Labs (all labs ordered are listed, but only abnormal results are displayed) Labs Reviewed  COVID-19, FLU A+B AND RSV    EKG   Radiology No results found.  Procedures Procedures (including critical care time)  Medications Ordered in UC Medications - No data to display  Initial Impression / Assessment and Plan / UC Course  I have reviewed the triage vital signs and the nursing notes.  Pertinent labs & imaging results that were available during my care of the patient were reviewed by me and considered in my medical decision making (see chart for details).   URI, acute sinusitis.  Treating with Zithromax.  Influenza, RSV, COVID pending.  Instructed patient to self quarantine until the test results are back.  Discussed symptomatic treatment including Tylenol, rest, hydration.  Instructed patient to follow up with PCP if her symptoms are not improving  Patient agrees to plan of  care.    Final Clinical Impressions(s) / UC Diagnoses   Final diagnoses:  Upper respiratory tract infection, unspecified type  Acute non-recurrent maxillary sinusitis     Discharge Instructions     Take the Zithromax as directed.    Your COVID and Flu tests are pending.  You should self quarantine until the test results are back.    Take Tylenol or ibuprofen as needed for fever or discomfort.  Rest and keep yourself hydrated.    Follow-up with your primary care provider if your symptoms are not improving.        ED Prescriptions    Medication Sig Dispense Auth. Provider   azithromycin (ZITHROMAX) 250 MG tablet Take 1 tablet (250 mg total) by mouth daily. Take first 2 tablets together, then 1 every day until finished. 6 tablet Sharion Balloon, NP     PDMP not reviewed this encounter.   Sharion Balloon, NP 01/31/20 1012

## 2020-01-31 NOTE — ED Triage Notes (Addendum)
Pt presents to Urgent Care with c/o nasal congestion, bil otalgia, and headache x several days. She reports greenish mucus. Pt w/ no known COVID exposure; has been vaccinated.

## 2020-01-31 NOTE — Discharge Instructions (Signed)
Take the Zithromax as directed.   Your COVID and Flu tests are pending.  You should self quarantine until the test results are back.    Take Tylenol or ibuprofen as needed for fever or discomfort.  Rest and keep yourself hydrated.    Follow-up with your primary care provider if your symptoms are not improving.     

## 2020-02-02 LAB — COVID-19, FLU A+B AND RSV
Influenza A, NAA: NOT DETECTED
Influenza B, NAA: NOT DETECTED
RSV, NAA: NOT DETECTED
SARS-CoV-2, NAA: NOT DETECTED

## 2020-02-03 ENCOUNTER — Telehealth: Payer: Self-pay | Admitting: Family Medicine

## 2020-02-03 DIAGNOSIS — R7303 Prediabetes: Secondary | ICD-10-CM

## 2020-02-03 DIAGNOSIS — E785 Hyperlipidemia, unspecified: Secondary | ICD-10-CM

## 2020-02-03 DIAGNOSIS — E559 Vitamin D deficiency, unspecified: Secondary | ICD-10-CM

## 2020-02-03 NOTE — Telephone Encounter (Signed)
-----   Message from Cloyd Stagers, RT sent at 01/29/2020 11:22 AM EST ----- Regarding: Lab Orders for Tuesday 12.14.2021 Please place lab orders for Tuesday 12.14.2021, office visit for physical on Tuesday 12.21.2021 Thank you, Dyke Maes RT(R)

## 2020-02-04 ENCOUNTER — Ambulatory Visit: Payer: Medicare HMO

## 2020-02-04 ENCOUNTER — Other Ambulatory Visit: Payer: Self-pay

## 2020-02-04 ENCOUNTER — Ambulatory Visit
Admission: EM | Admit: 2020-02-04 | Discharge: 2020-02-04 | Disposition: A | Discharge status: Home / Self Care | Payer: Medicare HMO | Attending: Family Medicine | Admitting: Family Medicine

## 2020-02-04 ENCOUNTER — Encounter: Payer: Self-pay | Admitting: Emergency Medicine

## 2020-02-04 DIAGNOSIS — R059 Cough, unspecified: Secondary | ICD-10-CM | POA: Diagnosis not present

## 2020-02-04 DIAGNOSIS — R0981 Nasal congestion: Secondary | ICD-10-CM

## 2020-02-04 MED ORDER — AMOXICILLIN 500 MG PO CAPS: 1000.0000 mg | ORAL_CAPSULE | Freq: Three times a day (TID) | ORAL | 0 refills | Status: AC

## 2020-02-04 NOTE — ED Triage Notes (Signed)
Patient c/o production cough w/ "greenish to brown" sputum production and congestion x 1 week.   Patient was given an antibiotic prescription on Friday (azithromycin) and was told to present back in clinic if symptoms worsen.   Patient denies fever or SOB.   History of COPD and lung cancer.

## 2020-02-04 NOTE — Discharge Instructions (Addendum)
Recommend using Flonase and Mucinex for symptoms Amoxicillin as prescribed Follow up as needed for continued or worsening symptoms

## 2020-02-05 NOTE — ED Provider Notes (Signed)
Hannah Green    CSN: [id number] Arrival date & time: 02/04/20  1105      History   Chief Complaint Chief Complaint  Patient presents with  . Cough    HPI SWAN FAIRFAX is a 74 y.o. female.   Pt is a 74 year old female that presents with continued cough, chest congestion, green/brown mucus.  This is been present x1 week.  Was prescribed azithromycin on Friday and told to present back if not better.  Reports her symptoms have not improved.  Denies any fever or shortness of breath.  History of COPD, lung cancer.     Past Medical History:  Diagnosis Date  . Breast cancer ([place]) 1999  . Cataract 2017   resolved with surgery  . COPD (chronic obstructive pulmonary disease) ([place])   . Coronary artery disease   . GERD (gastroesophageal reflux disease)   . Hyperlipidemia   . Postoperative atrial fibrillation ([place]) 03/2018  . Rheumatoid arthritis(714.0)   . Squamous cell carcinoma of lung, stage I, right ([place]) 2020   Status post right upper and middle lobectomy    Patient Active Problem List   Diagnosis Date Noted  . Left-sided chest wall pain 08/29/2019  . Pain and swelling of right lower leg 08/29/2019  . Acute neck pain 12/25/2018  . Goals of care, counseling/discussion 06/12/2018  . Stage I squamous cell carcinoma of right lung ([place]) 06/12/2018  . HCAP (healthcare-associated pneumonia) 05/08/2018  . Non-small cell lung cancer ([place]) 04/24/2018  . S/P Right Upper Lobectomy, Right Middle Lobectomy Lung 04/23/2018  . Pain and swelling of wrist, right 04/18/2018  . Exertional chest pain 04/04/2018  . DOE (dyspnea on exertion) 04/04/2018  . Pulmonary nodule 03/05/2018  . Prediabetes 01/08/2018  . COPD exacerbation ([place]) 04/27/2017  . COPD (chronic obstructive pulmonary disease) ([place]) 12/07/2016  . 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 Osteopenia of femoral neck 01/12/2016  . Rosacea 12/22/2015  . Constipation, chronic 11/11/2014  . Basal  cell carcinoma of left side of nose 06/01/2012  . Routine general medical examination at a health care facility 06/02/2011  . GERD (gastroesophageal reflux disease)   . Vitamin D deficiency 02/13/2008  . ADENOCARCINOMA, BREAST 02/08/2008  . Hyperlipidemia with target LDL less than 100 02/08/2008  . RHEUMATOID ARTHRITIS 02/08/2008    Past Surgical History:  Procedure Laterality Date  . BREAST LUMPECTOMY  1999   left, needed radiation  . CATARACT EXTRACTION W/ INTRAOCULAR LENS  IMPLANT, BILATERAL Bilateral 05/19/15, 06/02/15  . CHEST TUBE INSERTION Right 04/23/2018   Procedure: Right Lateral Chest Tube Insertion;  Surgeon: Melrose Nakayama, MD;  Location: [facility];  Service: Thoracic;  Laterality: Right;  . CHOLECYSTECTOMY    . LOBECTOMY Right 04/23/2018   Procedure: RIGHT UPPER LOBECTOMY;  Surgeon: Melrose Nakayama, MD;  Location: [facility];  Service: Thoracic;  Laterality: Right;  . NM MYOVIEW LTD  01/2016   LOW RISK. Hypertensive response to exercise followed by significant drop (stage I pressure 211/70 drop to 118/75. Stage II). Apical artifact but otherwise no ischemia or infarction. EF 64%.  Marland Kitchen NODE DISSECTION Right 04/23/2018   Procedure: MEDIASTINAL NODE DISSECTION;  Surgeon: Melrose Nakayama, MD;  Location: [facility];  Service: Thoracic;  Laterality: Right;  . RIGHT/LEFT HEART CATH AND CORONARY ANGIOGRAPHY N/A 04/10/2018   Procedure: RIGHT/LEFT HEART CATH AND CORONARY ANGIOGRAPHY;  Surgeon: Leonie Man, MD;  Location: [facility];; angiographically minimal coronary artery disease.  Normal LVEDP.  Basely  normal RHC pressures with no suggestion of pulmonary hypertension.  Normal CO-CI by Fick (5.9-2.8)  . TONSILLECTOMY    . TRANSTHORACIC ECHOCARDIOGRAM  11/'17; 2/'20   a) Nov 2017: Mild LVH. EF 60-65%. GR 2-DD; b) Feb 2020: Normal LV size and function.  EF 60 to 65%.  No R WMA.  GR 1 DD.  Normal RV size and function.  Normal pressures.  No valvular disease.  Marland Kitchen VIDEO ASSISTED  THORACOSCOPY (VATS)/WEDGE RESECTION Right 04/23/2018   Procedure: VIDEO ASSISTED THORACOSCOPY (VATS)/WEDGE RESECTION OF RIGHT UPPER LUNG;  Surgeon: Melrose Nakayama, MD;  Location: [facility];  Service: Thoracic;  Laterality: Right;    OB History   No obstetric history on file.      Home Medications    Prior to Admission medications   Medication Sig Start Date End Date Taking? Authorizing Provider  aspirin EC 81 MG tablet Take 1 tablet (81 mg total) by mouth daily. 04/04/18  Yes Leonie Man, MD  atorvastatin (LIPITOR) 10 MG tablet TAKE 1 TABLET BY MOUTH EVERY DAY 12/09/19  Yes Bedsole, Amy E, MD  cholecalciferol (VITAMIN D3) 25 MCG (1000 UT) tablet Take 1,000 Units by mouth daily.   Yes [provider]  albuterol (VENTOLIN HFA) 108 (90 Base) MCG/ACT inhaler TAKE 2 PUFFS BY MOUTH EVERY 6 HOURS AS NEEDED FOR WHEEZE OR SHORTNESS OF BREATH 09/16/19   Magdalen Spatz, NP  amoxicillin (AMOXIL) 500 MG capsule Take 2 capsules (1,000 mg total) by mouth 3 (three) times daily for 5 days. 02/04/20 02/09/20  Loura Halt A, NP  Fluticasone-Umeclidin-Vilant (TRELEGY ELLIPTA) 100-62.5-25 MCG/INH AEPB Inhale 1 puff into the lungs daily. 09/24/19   Magdalen Spatz, NP    Family History Family History  Problem Relation Age of Onset  . Cancer Mother        breast  . Cancer Father        lung cancer  . Cancer Sister        throat cancer  . Diabetes Paternal Uncle   . Diabetes Maternal Grandmother     Social History Social History   Tobacco Use  . Smoking status: Former Smoker    Packs/day: 1.50    Years: 50.00    Pack years: 75.00    Types: Cigarettes    Quit date: 04/01/2011    Years since quitting: 8.8  . Smokeless tobacco: Never Used  . Tobacco comment: has stopped but has started again in "crisis"  Vaping Use  . Vaping Use: Never used  Substance Use Topics  . Alcohol use: Yes    Alcohol/week: 2.0 standard drinks    Types: 2 Glasses of wine per week    Comment: monthly  .  Drug use: No     Allergies   Adhesive [tape], Neosporin [neomycin-bacitracin zn-polymyx], and Penicillins   Review of Systems Review of Systems   Physical Exam Triage Vital Signs ED Triage Vitals  Enc Vitals Group     BP 02/04/20 1134 135/67     Pulse Rate 02/04/20 1134 67     Resp 02/04/20 1134 19     Temp 02/04/20 1134 98 F (36.7 C)     Temp Source 02/04/20 1134 Oral     SpO2 02/04/20 1134 95 %     Weight 02/04/20 1131 220 lb (99.8 kg)     Height 02/04/20 1131 5' 6.5" (1.689 m)     Head Circumference --      Peak Flow --  Pain Score 02/04/20 1131 0     Pain Loc --      Pain Edu? --      Excl. in [place]? --    No data found.  Updated Vital Signs BP 135/67 (BP Location: Right Arm)   Pulse 67   Temp 98 F (36.7 C) (Oral)   Resp 19   Ht 5' 6.5" (1.689 m)   Wt 220 lb (99.8 kg)   LMP  (LMP Unknown)   SpO2 95%   BMI 34.98 kg/m   Visual Acuity Right Eye Distance:   Left Eye Distance:   Bilateral Distance:    Right Eye Near:   Left Eye Near:    Bilateral Near:     Physical Exam Vitals and nursing note reviewed.  Constitutional:      General: She is not in acute distress.    Appearance: Normal appearance. She is not ill-appearing, toxic-appearing or diaphoretic.  HENT:     Head: Normocephalic.     Right Ear: Tympanic membrane and ear canal normal.     Left Ear: Tympanic membrane and ear canal normal.     Nose: Nose normal.     Mouth/Throat:     Pharynx: Oropharynx is clear.  Eyes:     Conjunctiva/sclera: Conjunctivae normal.  Cardiovascular:     Rate and Rhythm: Normal rate and regular rhythm.  Pulmonary:     Effort: Pulmonary effort is normal.     Breath sounds: Normal breath sounds.  Musculoskeletal:        General: Normal range of motion.     Cervical back: Normal range of motion.  Skin:    General: Skin is warm and dry.     Findings: No rash.  Neurological:     Mental Status: She is alert.  Psychiatric:        Mood and Affect: Mood  normal.      UC Treatments / Results  Labs (all labs ordered are listed, but only abnormal results are displayed) Labs Reviewed  NOVEL CORONAVIRUS, NAA    EKG   Radiology No results found.  Procedures Procedures (including critical care time)  Medications Ordered in UC Medications - No data to display  Initial Impression / Assessment and Plan / UC Course  I have reviewed the triage vital signs and the nursing notes.  Pertinent labs & imaging results that were available during my care of the patient were reviewed by me and considered in my medical decision making (see chart for details).     Cough and sinus congestion Patient not seeing any improvement with the azithromycin. Requesting to be tested for Covid again.  Negative Covid test on Friday. Patient also requesting a change in her antibiotics believes these antibiotics not working. I recommended using Flonase and Mucinex for symptoms We will cover with amoxicillin at this time to see if this helps Follow up as needed for continued or worsening symptoms  Final Clinical Impressions(s) / UC Diagnoses   Final diagnoses:  Cough  Sinus congestion     Discharge Instructions     Recommend using Flonase and Mucinex for symptoms Amoxicillin as prescribed Follow up as needed for continued or worsening symptoms      ED Prescriptions    Medication Sig Dispense Auth. Provider   amoxicillin (AMOXIL) 500 MG capsule Take 2 capsules (1,000 mg total) by mouth 3 (three) times daily for 5 days. 30 capsule Seham Gardenhire A, NP     PDMP not reviewed this encounter.  Orvan July, NP 02/05/20 1202

## 2020-02-06 ENCOUNTER — Ambulatory Visit: Payer: Medicare HMO

## 2020-02-06 LAB — SARS-COV-2, NAA 2 DAY TAT

## 2020-02-06 LAB — NOVEL CORONAVIRUS, NAA: SARS-CoV-2, NAA: NOT DETECTED

## 2020-02-11 ENCOUNTER — Encounter: Payer: Self-pay | Admitting: *Deleted

## 2020-02-11 ENCOUNTER — Ambulatory Visit: Payer: Medicare HMO

## 2020-02-11 ENCOUNTER — Other Ambulatory Visit (INDEPENDENT_AMBULATORY_CARE_PROVIDER_SITE_OTHER): Payer: Medicare HMO

## 2020-02-11 ENCOUNTER — Other Ambulatory Visit: Payer: Self-pay

## 2020-02-11 DIAGNOSIS — E785 Hyperlipidemia, unspecified: Secondary | ICD-10-CM

## 2020-02-11 DIAGNOSIS — R7303 Prediabetes: Secondary | ICD-10-CM

## 2020-02-11 DIAGNOSIS — E559 Vitamin D deficiency, unspecified: Secondary | ICD-10-CM

## 2020-02-11 DIAGNOSIS — J449 Chronic obstructive pulmonary disease, unspecified: Secondary | ICD-10-CM

## 2020-02-11 LAB — COMPREHENSIVE METABOLIC PANEL
ALT: 15 U/L (ref 0–35)
AST: 18 U/L (ref 0–37)
Albumin: 3.9 g/dL (ref 3.5–5.2)
Alkaline Phosphatase: 62 U/L (ref 39–117)
BUN: 19 mg/dL (ref 6–23)
CO2: 29 mEq/L (ref 19–32)
Calcium: 9.3 mg/dL (ref 8.4–10.5)
Chloride: 103 mEq/L (ref 96–112)
Creatinine, Ser: 0.69 mg/dL (ref 0.40–1.20)
GFR: 85.43 mL/min (ref >60.00)
Glucose, Bld: 106 mg/dL — ABNORMAL HIGH (ref 70–99)
Potassium: 4.1 mEq/L (ref 3.5–5.1)
Sodium: 139 mEq/L (ref 135–145)
Total Bilirubin: 0.7 mg/dL (ref 0.2–1.2)
Total Protein: 6.9 g/dL (ref 6.0–8.3)

## 2020-02-11 LAB — VITAMIN D 25 HYDROXY (VIT D DEFICIENCY, FRACTURES): VITD: 26.74 ng/mL — ABNORMAL LOW (ref 30.00–100.00)

## 2020-02-11 LAB — LIPID PANEL
Cholesterol: 179 mg/dL (ref 0–200)
HDL: 58.4 mg/dL (ref >39.00)
LDL Cholesterol: 88 mg/dL (ref 0–99)
NonHDL: 120.23
Total CHOL/HDL Ratio: 3
Triglycerides: 162 mg/dL — ABNORMAL HIGH (ref 0.0–149.0)
VLDL: 32.4 mg/dL (ref 0.0–40.0)

## 2020-02-11 LAB — HEMOGLOBIN A1C: Hgb A1c MFr Bld: 5.7 % (ref 4.6–6.5)

## 2020-02-11 NOTE — Progress Notes (Signed)
No critical labs need to be addressed urgently. We will discuss labs in detail at upcoming office visit.   

## 2020-02-13 ENCOUNTER — Ambulatory Visit: Payer: Medicare HMO

## 2020-02-14 ENCOUNTER — Other Ambulatory Visit: Payer: Self-pay

## 2020-02-14 ENCOUNTER — Ambulatory Visit (INDEPENDENT_AMBULATORY_CARE_PROVIDER_SITE_OTHER): Payer: Medicare HMO

## 2020-02-14 DIAGNOSIS — Z0000 Encounter for general adult medical examination without abnormal findings: Secondary | ICD-10-CM

## 2020-02-14 NOTE — Progress Notes (Signed)
Subjective:   Hannah Green is a 74 y.o. female who presents for Medicare Annual (Subsequent) preventive examination.  Review of Systems: N/A      I connected with the patient today by telephone and verified that I am speaking with the correct person using two identifiers. Location patient: home Location nurse: work Persons participating in the telephone visit: patient, nurse.   I discussed the limitations, risks, security and privacy concerns of performing an evaluation and management service by telephone and the availability of in person appointments. I also discussed with the patient that there may be a patient responsible charge related to this service. The patient expressed understanding and verbally consented to this telephonic visit.        Cardiac Risk Factors include: advanced age (>3men, >50 women);Other (see comment), Risk factor comments: hyperlipidemia     Objective:    Today's Vitals   02/14/20 1109  PainSc: 0-No pain   There is no height or weight on file to calculate BMI.  Advanced Directives 02/14/2020 08/07/2019 12/11/2018 06/12/2018 05/10/2018 04/23/2018 04/23/2018  Does Patient Have a Medical Advance Directive? Yes Yes Yes Yes Yes Yes Yes  Type of Paramedic of [place];Living will - [place];Living will [place];Living will [place];Living will [place];Living will [place];Living will  Does patient want to make changes to medical advance directive? - No - Patient declined - - No - Patient declined No - Patient declined -  Copy of [place] in Chart? No - copy requested - No - copy requested - - No - copy requested No - copy requested    Current Medications (verified) Outpatient Encounter Medications as of 02/14/2020  Medication Sig  . albuterol (VENTOLIN HFA) 108 (90 Base) MCG/ACT inhaler TAKE 2 PUFFS BY MOUTH EVERY  6 HOURS AS NEEDED FOR WHEEZE OR SHORTNESS OF BREATH  . aspirin EC 81 MG tablet Take 1 tablet (81 mg total) by mouth daily.  Marland Kitchen atorvastatin (LIPITOR) 10 MG tablet TAKE 1 TABLET BY MOUTH EVERY DAY  . cholecalciferol (VITAMIN D3) 25 MCG (1000 UT) tablet Take 1,000 Units by mouth daily.  . Fluticasone-Umeclidin-Vilant (TRELEGY ELLIPTA) 100-62.5-25 MCG/INH AEPB Inhale 1 puff into the lungs daily.   No facility-administered encounter medications on file as of 02/14/2020.    Allergies (verified) Adhesive [tape], Neosporin [neomycin-bacitracin zn-polymyx], and Penicillins   History: Past Medical History:  Diagnosis Date  . Breast cancer ([place]) 1999  . Cataract 2017   resolved with surgery  . COPD (chronic obstructive pulmonary disease) ([place])   . Coronary artery disease   . GERD (gastroesophageal reflux disease)   . Hyperlipidemia   . Postoperative atrial fibrillation ([place]) 03/2018  . Rheumatoid arthritis(714.0)   . Squamous cell carcinoma of lung, stage I, right ([place]) 2020   Status post right upper and middle lobectomy   Past Surgical History:  Procedure Laterality Date  . BREAST LUMPECTOMY  1999   left, needed radiation  . CATARACT EXTRACTION W/ INTRAOCULAR LENS  IMPLANT, BILATERAL Bilateral 05/19/15, 06/02/15  . CHEST TUBE INSERTION Right 04/23/2018   Procedure: Right Lateral Chest Tube Insertion;  Surgeon: Melrose Nakayama, MD;  Location: [facility];  Service: Thoracic;  Laterality: Right;  . CHOLECYSTECTOMY    . LOBECTOMY Right 04/23/2018   Procedure: RIGHT UPPER LOBECTOMY;  Surgeon: Melrose Nakayama, MD;  Location: [facility];  Service: Thoracic;  Laterality: Right;  . NM MYOVIEW LTD  01/2016   LOW RISK. Hypertensive response to exercise followed by significant drop (stage I pressure 211/70 drop to 118/75. Stage II). Apical artifact but otherwise no ischemia or infarction. EF 64%.  Marland Kitchen NODE DISSECTION Right 04/23/2018   Procedure: MEDIASTINAL NODE DISSECTION;  Surgeon: Melrose Nakayama, MD;  Location: [facility];  Service: Thoracic;  Laterality: Right;  . RIGHT/LEFT HEART CATH AND CORONARY ANGIOGRAPHY N/A 04/10/2018   Procedure: RIGHT/LEFT HEART CATH AND CORONARY ANGIOGRAPHY;  Surgeon: Leonie Man, MD;  Location: [facility];; angiographically minimal coronary artery disease.  Normal LVEDP.  Basely normal RHC pressures with no suggestion of pulmonary hypertension.  Normal CO-CI by Fick (5.9-2.8)  . TONSILLECTOMY    . TRANSTHORACIC ECHOCARDIOGRAM  11/'17; 2/'20   a) Nov 2017: Mild LVH. EF 60-65%. GR 2-DD; b) Feb 2020: Normal LV size and function.  EF 60 to 65%.  No R WMA.  GR 1 DD.  Normal RV size and function.  Normal pressures.  No valvular disease.  Marland Kitchen VIDEO ASSISTED THORACOSCOPY (VATS)/WEDGE RESECTION Right 04/23/2018   Procedure: VIDEO ASSISTED THORACOSCOPY (VATS)/WEDGE RESECTION OF RIGHT UPPER LUNG;  Surgeon: Melrose Nakayama, MD;  Location: [facility] OR;  Service: Thoracic;  Laterality: Right;   Family History  Problem Relation Age of Onset  . Cancer Mother        breast  . Cancer Father        lung cancer  . Cancer Sister        throat cancer  . Diabetes Paternal Uncle   . Diabetes Maternal Grandmother    Social History   Socioeconomic History  . Marital status: Divorced    Spouse name: Not on file  . Number of children: 2  . Years of education: Not on file  . Highest education level: Not on file  Occupational History  . Occupation: Retired Freight forwarder    Comment: Veterinary surgeon  Tobacco Use  . Smoking status: Former Smoker    Packs/day: 1.50    Years: 50.00    Pack years: 75.00    Types: Cigarettes    Quit date: 04/01/2011    Years since quitting: 8.8  . Smokeless tobacco: Never Used  . Tobacco comment: has stopped but has started again in "crisis"  Vaping Use  . Vaping Use: Never used  Substance and Sexual Activity  . Alcohol use: Yes    Alcohol/week: 2.0 standard drinks    Types: 2 Glasses of wine per week    Comment: monthly  . Drug use: No   . Sexual activity: Never  Other Topics Concern  . Not on file  Social History Narrative   Lives alone   No living will   Requests friend Porfirio Mylar to make health care decisions.   Would accept resuscitation but no prolonged ventilation   Would not want tube feeds if cognitively unaware   Social Determinants of Health   Financial Resource Strain: Low Risk   . Difficulty of Paying Living Expenses: Not hard at all  Food Insecurity: No Food Insecurity  . Worried About Charity fundraiser in the Last Year: Never true  . Ran Out of Food in the Last Year: Never true  Transportation Needs: No Transportation Needs  . Lack of Transportation (Medical): No  . Lack of Transportation (Non-Medical): No  Physical Activity: Insufficiently Active  . Days of Exercise per Week: 2 days  . Minutes of Exercise per Session: 60 min  Stress: No Stress Concern Present  .  Feeling of Stress : Not at all  Social Connections: Not on file    Tobacco Counseling Counseling given: Not Answered Comment: has stopped but has started again in "crisis"   Clinical Intake:  Pre-visit preparation completed: Yes  Pain : No/denies pain Pain Score: 0-No pain     Nutritional Risks: None Diabetes: No  How often do you need to have someone help you when you read instructions, pamphlets, or other written materials from your doctor or pharmacy?: 1 - Never What is the last grade level you completed in school?: 12th  Diabetic: No Nutrition Risk Assessment:  Has the patient had any N/V/D within the last 2 months?  No  Does the patient have any non-healing wounds?  No  Has the patient had any unintentional weight loss or weight gain?  No   Diabetes:  Is the patient diabetic?  No  If diabetic, was a CBG obtained today?  N/A Did the patient bring in their glucometer from home?  N/A How often do you monitor your CBG's? N/A.   Financial Strains and Diabetes Management:  Are you having any financial strains  with the device, your supplies or your medication? N/A.  Does the patient want to be seen by Chronic Care Management for management of their diabetes?  N/A Would the patient like to be referred to a Nutritionist or for Diabetic Management?  N/A    Interpreter Needed?: No  Information entered by :: CJohnson, LPN   Activities of Daily Living In your present state of health, do you have any difficulty performing the following activities: 02/14/2020  Hearing? Y  Comment wears hearing aids  Vision? N  Difficulty concentrating or making decisions? N  Walking or climbing stairs? N  Dressing or bathing? N  Doing errands, shopping? N  Preparing Food and eating ? N  Using the Toilet? N  In the past six months, have you accidently leaked urine? N  Do you have problems with loss of bowel control? N  Managing your Medications? N  Managing your Finances? N  Housekeeping or managing your Housekeeping? N  Some recent data might be hidden    Patient Care Team: Jinny Sanders, MD as PCP - General Leonie Man, MD as PCP - Cardiology (Cardiology) Thelma Comp, [place] as Consulting Physician (Optometry) Vevelyn Royals, MD as Consulting Physician (Ophthalmology) Carloyn Manner, MD as Referring Physician (Otolaryngology) Deneise Lever, MD as Consulting Physician (Pulmonary Disease)  Indicate any recent Medical Services you may have received from other than [facility] providers in the past year (date may be approximate).     Assessment:   This is a routine wellness examination for [place].  Hearing/Vision screen  Hearing Screening   125Hz  250Hz  500Hz  1000Hz  2000Hz  3000Hz  4000Hz  6000Hz  8000Hz   Right ear:           Left ear:           Vision Screening Comments: Patient gets annual eye exams   Dietary issues and exercise activities discussed: Current Exercise Habits: Structured exercise class, Type of exercise: strength training/weights, Time (Minutes): 60, Frequency (Times/Week): 2,  Weekly Exercise (Minutes/Week): 120, Intensity: Moderate, Exercise limited by: None identified  Goals    . Increase physical activity     Starting 12/05/2017, I will continue to walk for 30 minutes 3 days per week.     . Patient Stated     12/11/2018, Patient states that she wants to increase her exercise in the future.     Marland Kitchen  Patient Stated     02/14/2020, I will continue to do physical therapy 2 days a week for 1 hour.       Depression Screen PHQ 2/9 Scores 02/14/2020 01/09/2020 08/13/2019 12/11/2018 12/05/2017 11/24/2016 11/24/2015  PHQ - 2 Score 0 0 0 0 1 1 0  PHQ- 9 Score 0 8 5 0 7 6 -    Fall Risk Fall Risk  02/14/2020 08/07/2019 12/11/2018 12/05/2017 11/24/2016  Falls in the past year? 0 0 0 No No  Number falls in past yr: 0 0 - - -  Injury with Fall? 0 0 - - -  Risk for fall due to : Medication side effect No Fall Risks Medication side effect - -  Follow up Falls evaluation completed;Falls prevention discussed Falls evaluation completed;Education provided;Falls prevention discussed Falls evaluation completed;Falls prevention discussed - -    FALL RISK PREVENTION PERTAINING TO THE HOME:  Any stairs in or around the home? Yes  If so, are there any without handrails? No  Home free of loose throw rugs in walkways, pet beds, electrical cords, etc? Yes  Adequate lighting in your home to reduce risk of falls? Yes   ASSISTIVE DEVICES UTILIZED TO PREVENT FALLS:  Life alert? No  Use of a cane, walker or w/c? No  Grab bars in the bathroom? No  Shower chair or bench in shower? No  Elevated toilet seat or a handicapped toilet? No   TIMED UP AND GO:  Was the test performed? N/A, telephone visit.    Cognitive Function: MMSE - Mini Mental State Exam 02/14/2020 12/11/2018 12/05/2017 11/24/2016 11/24/2015  Orientation to time 5 5 5 5 5   Orientation to Place 5 5 5 5 5   Registration 3 3 3 3 3   Attention/ Calculation 5 5 0 0 0  Recall 3 3 3 3 3   Language- name 2 objects - - 0 0 0   Language- repeat 1 1 1 1 1   Language- follow 3 step command - - 3 3 3   Language- read & follow direction - - 0 0 0  Write a sentence - - 0 0 0  Copy design - - 0 0 0  Total score - - 20 20 20   Mini Cog  Mini-Cog screen was completed. Maximum score is 22. A value of 0 denotes this part of the MMSE was not completed or the patient failed this part of the Mini-Cog screening.       Immunizations Immunization History  Administered Date(s) Administered  . Fluad Quad(high Dose 65+) 12/25/2018  . Influenza, High Dose Seasonal PF 12/05/2017  . Influenza,inj,Quad PF,6+ Mos 12/22/2015, 11/24/2016  . PFIZER SARS-COV-2 Vaccination 04/21/2019, 05/14/2019, 11/29/2019  . Pneumococcal Conjugate-13 12/22/2015  . Pneumococcal Polysaccharide-23 06/02/2011  . Td 06/02/2011    TDAP status: Up to date  Flu Vaccine status: Due, Education has been provided regarding the importance of this vaccine. Advised may receive this vaccine at local pharmacy or Health Dept. Aware to provide a copy of the vaccination record if obtained from local pharmacy or Health Dept. Verbalized acceptance and understanding.  Pneumococcal vaccine status: Up to date  Covid-19 vaccine status: Completed vaccines  Qualifies for Shingles Vaccine? Yes   Zostavax completed No   Shingrix Completed?: No.    Education has been provided regarding the importance of this vaccine. Patient has been advised to call insurance company to determine out of pocket expense if they have not yet received this vaccine. Advised may also receive vaccine at local pharmacy  or Health Dept. Verbalized acceptance and understanding.  Screening Tests Health Maintenance  Topic Date Due  . INFLUENZA VACCINE  09/29/2019  . COVID-19 Vaccine (4 - Booster for Pfizer series) 05/29/2020  . MAMMOGRAM  06/18/2020  . COLONOSCOPY  11/21/2020  . TETANUS/TDAP  06/01/2021  . DEXA SCAN  Completed  . Hepatitis C Screening  Completed  . PNA vac Low Risk Adult   Completed    Health Maintenance  Health Maintenance Due  Topic Date Due  . INFLUENZA VACCINE  09/29/2019    Colorectal cancer screening: Type of screening: Colonoscopy. Completed 11/22/2010. Repeat every 10 years  Mammogram status: Completed 06/19/2019. Repeat every year  Bone Density status: due, wants this scheduled along with her mammogram when it is due next year  Lung Cancer Screening: (Low Dose CT Chest recommended if Age 54-80 years, 30 pack-year currently smoking OR have quit w/in 15years.) does not qualify.    Additional Screening:  Hepatitis C Screening: does qualify; Completed 11/24/2015  Vision Screening: Recommended annual ophthalmology exams for early detection of glaucoma and other disorders of the eye. Is the patient up to date with their annual eye exam?  Yes  Who is the provider or what is the name of the office in which the patient attends annual eye exams? Dr. Rick Duff If pt is not established with a provider, would they like to be referred to a provider to establish care? No .   Dental Screening: Recommended annual dental exams for proper oral hygiene  Community Resource Referral / Chronic Care Management: CRR required this visit?  No   CCM required this visit?  No      Plan:     I have personally reviewed and noted the following in the patient's chart:   . Medical and social history . Use of alcohol, tobacco or illicit drugs  . Current medications and supplements . Functional ability and status . Nutritional status . Physical activity . Advanced directives . List of other physicians . Hospitalizations, surgeries, and ER visits in previous 12 months . Vitals . Screenings to include cognitive, depression, and falls . Referrals and appointments  In addition, I have reviewed and discussed with patient certain preventive protocols, quality metrics, and best practice recommendations. A written personalized care plan for preventive services as well  as general preventive health recommendations were provided to patient.   Due to this being a telephonic visit, the after visit summary with patients personalized plan was offered to patient via office or my-chart. Patient preferred to pick up at office at next visit or via mychart.   Andrez Grime, LPN   [id number]

## 2020-02-14 NOTE — Patient Instructions (Signed)
Hannah Green , Thank you for taking time to come for your Medicare Wellness Visit. I appreciate your ongoing commitment to your health goals. Please review the following plan we discussed and let me know if I can assist you in the future.   Screening recommendations/referrals: Colonoscopy: Up to date, completed 11/22/2010, due 10/2020 Mammogram: Up to date, completed 06/19/2019, due 05/2020 Bone Density: due, wants this scheduled along with her mammogram when it is due next year Recommended yearly ophthalmology/optometry visit for glaucoma screening and checkup Recommended yearly dental visit for hygiene and checkup  Vaccinations: Influenza vaccine: due, wants to get this at upcoming physical  Pneumococcal vaccine: Completed series Tdap vaccine: Up to date, completed 06/02/2011, due 05/2021 Shingles vaccine: due, check with your insurance regarding coverage/cost if interested   Covid-19:Completed series  Advanced directives: Please bring a copy of your POA (Power of [place]) and/or Living Will to your next appointment.   Conditions/risks identified: hyperlipidemia  Next appointment: Follow up in one year for your annual wellness visit    Preventive Care 65 Years and Older, Female Preventive care refers to lifestyle choices and visits with your health care provider that can promote health and wellness. What does preventive care include?  A yearly physical exam. This is also called an annual well check.  Dental exams once or twice a year.  Routine eye exams. Ask your health care provider how often you should have your eyes checked.  Personal lifestyle choices, including:  Daily care of your teeth and gums.  Regular physical activity.  Eating a healthy diet.  Avoiding tobacco and drug use.  Limiting alcohol use.  Practicing safe sex.  Taking low-dose aspirin every day.  Taking vitamin and mineral supplements as recommended by your health care provider. What happens during an  annual well check? The services and screenings done by your health care provider during your annual well check will depend on your age, overall health, lifestyle risk factors, and family history of disease. Counseling  Your health care provider may ask you questions about your:  Alcohol use.  Tobacco use.  Drug use.  Emotional well-being.  Home and relationship well-being.  Sexual activity.  Eating habits.  History of falls.  Memory and ability to understand (cognition).  Work and work Statistician.  Reproductive health. Screening  You may have the following tests or measurements:  Height, weight, and BMI.  Blood pressure.  Lipid and cholesterol levels. These may be checked every 5 years, or more frequently if you are over 38 years old.  Skin check.  Lung cancer screening. You may have this screening every year starting at age 59 if you have a 30-pack-year history of smoking and currently smoke or have quit within the past 15 years.  Fecal occult blood test (FOBT) of the stool. You may have this test every year starting at age 68.  Flexible sigmoidoscopy or colonoscopy. You may have a sigmoidoscopy every 5 years or a colonoscopy every 10 years starting at age 52.  Hepatitis C blood test.  Hepatitis B blood test.  Sexually transmitted disease (STD) testing.  Diabetes screening. This is done by checking your blood sugar (glucose) after you have not eaten for a while (fasting). You may have this done every 1-3 years.  Bone density scan. This is done to screen for osteoporosis. You may have this done starting at age 76.  Mammogram. This may be done every 1-2 years. Talk to your health care provider about how often you should have  regular mammograms. Talk with your health care provider about your test results, treatment options, and if necessary, the need for more tests. Vaccines  Your health care provider may recommend certain vaccines, such as:  Influenza  vaccine. This is recommended every year.  Tetanus, diphtheria, and acellular pertussis (Tdap, Td) vaccine. You may need a Td booster every 10 years.  Zoster vaccine. You may need this after age 7.  Pneumococcal 13-valent conjugate (PCV13) vaccine. One dose is recommended after age 28.  Pneumococcal polysaccharide (PPSV23) vaccine. One dose is recommended after age 70. Talk to your health care provider about which screenings and vaccines you need and how often you need them. This information is not intended to replace advice given to you by your health care provider. Make sure you discuss any questions you have with your health care provider. Document Released: 03/13/2015 Document Revised: 11/04/2015 Document Reviewed: 12/16/2014 Elsevier Interactive Patient Education  2017 [employer] Prevention in the Home Falls can cause injuries. They can happen to people of all ages. There are many things you can do to make your home safe and to help prevent falls. What can I do on the outside of my home?  Regularly fix the edges of walkways and driveways and fix any cracks.  Remove anything that might make you trip as you walk through a door, such as a raised step or threshold.  Trim any bushes or trees on the path to your home.  Use bright outdoor lighting.  Clear any walking paths of anything that might make someone trip, such as rocks or tools.  Regularly check to see if handrails are loose or broken. Make sure that both sides of any steps have handrails.  Any raised decks and porches should have guardrails on the edges.  Have any leaves, snow, or ice cleared regularly.  Use sand or salt on walking paths during winter.  Clean up any spills in your garage right away. This includes oil or grease spills. What can I do in the bathroom?  Use night lights.  Install grab bars by the toilet and in the tub and shower. Do not use towel bars as grab bars.  Use non-skid mats or decals  in the tub or shower.  If you need to sit down in the shower, use a plastic, non-slip stool.  Keep the floor dry. Clean up any water that spills on the floor as soon as it happens.  Remove soap buildup in the tub or shower regularly.  Attach bath mats securely with double-sided non-slip rug tape.  Do not have throw rugs and other things on the floor that can make you trip. What can I do in the bedroom?  Use night lights.  Make sure that you have a light by your bed that is easy to reach.  Do not use any sheets or blankets that are too big for your bed. They should not hang down onto the floor.  Have a firm chair that has side arms. You can use this for support while you get dressed.  Do not have throw rugs and other things on the floor that can make you trip. What can I do in the kitchen?  Clean up any spills right away.  Avoid walking on wet floors.  Keep items that you use a lot in easy-to-reach places.  If you need to reach something above you, use a strong step stool that has a grab bar.  Keep electrical cords out of the  way.  Do not use floor polish or wax that makes floors slippery. If you must use wax, use non-skid floor wax.  Do not have throw rugs and other things on the floor that can make you trip. What can I do with my stairs?  Do not leave any items on the stairs.  Make sure that there are handrails on both sides of the stairs and use them. Fix handrails that are broken or loose. Make sure that handrails are as long as the stairways.  Check any carpeting to make sure that it is firmly attached to the stairs. Fix any carpet that is loose or worn.  Avoid having throw rugs at the top or bottom of the stairs. If you do have throw rugs, attach them to the floor with carpet tape.  Make sure that you have a light switch at the top of the stairs and the bottom of the stairs. If you do not have them, ask someone to add them for you. What else can I do to help  prevent falls?  Wear shoes that:  Do not have high heels.  Have rubber bottoms.  Are comfortable and fit you well.  Are closed at the toe. Do not wear sandals.  If you use a stepladder:  Make sure that it is fully opened. Do not climb a closed stepladder.  Make sure that both sides of the stepladder are locked into place.  Ask someone to hold it for you, if possible.  Clearly mark and make sure that you can see:  Any grab bars or handrails.  First and last steps.  Where the edge of each step is.  Use tools that help you move around (mobility aids) if they are needed. These include:  Canes.  Walkers.  Scooters.  Crutches.  Turn on the lights when you go into a dark area. Replace any light bulbs as soon as they burn out.  Set up your furniture so you have a clear path. Avoid moving your furniture around.  If any of your floors are uneven, fix them.  If there are any pets around you, be aware of where they are.  Review your medicines with your doctor. Some medicines can make you feel dizzy. This can increase your chance of falling. Ask your doctor what other things that you can do to help prevent falls. This information is not intended to replace advice given to you by your health care provider. Make sure you discuss any questions you have with your health care provider. Document Released: 12/11/2008 Document Revised: 07/23/2015 Document Reviewed: 03/21/2014 Elsevier Interactive Patient Education  2017 [employer].

## 2020-02-14 NOTE — Progress Notes (Signed)
PCP notes:  Health Maintenance: Flu- due Dexa- due, wants this scheduled along with her mammogram when it is due next year    Abnormal Screenings: none   Patient concerns: Hand tremors onset 6 months ago  Abnormal moles on body and concerning spot on her face- needs referral to dermatology   Nurse concerns: none   Next PCP appt.: 02/18/2020 @ 3:20 pm

## 2020-02-18 ENCOUNTER — Ambulatory Visit (INDEPENDENT_AMBULATORY_CARE_PROVIDER_SITE_OTHER): Payer: Medicare HMO | Admitting: Family Medicine

## 2020-02-18 ENCOUNTER — Ambulatory Visit: Payer: Medicare HMO

## 2020-02-18 ENCOUNTER — Encounter: Payer: Self-pay | Admitting: Family Medicine

## 2020-02-18 ENCOUNTER — Other Ambulatory Visit: Payer: Self-pay

## 2020-02-18 VITALS — BP 130/64 | HR 83 | Temp 97.2°F | Ht 65.5 in | Wt 231.2 lb

## 2020-02-18 DIAGNOSIS — I5189 Other ill-defined heart diseases: Secondary | ICD-10-CM

## 2020-02-18 DIAGNOSIS — R251 Tremor, unspecified: Secondary | ICD-10-CM | POA: Diagnosis not present

## 2020-02-18 DIAGNOSIS — J449 Chronic obstructive pulmonary disease, unspecified: Secondary | ICD-10-CM | POA: Diagnosis not present

## 2020-02-18 DIAGNOSIS — L989 Disorder of the skin and subcutaneous tissue, unspecified: Secondary | ICD-10-CM | POA: Diagnosis not present

## 2020-02-18 DIAGNOSIS — Z0000 Encounter for general adult medical examination without abnormal findings: Secondary | ICD-10-CM | POA: Diagnosis not present

## 2020-02-18 DIAGNOSIS — E785 Hyperlipidemia, unspecified: Secondary | ICD-10-CM | POA: Diagnosis not present

## 2020-02-18 DIAGNOSIS — Z23 Encounter for immunization: Secondary | ICD-10-CM | POA: Diagnosis not present

## 2020-02-18 DIAGNOSIS — R7303 Prediabetes: Secondary | ICD-10-CM

## 2020-02-18 DIAGNOSIS — E2839 Other primary ovarian failure: Secondary | ICD-10-CM

## 2020-02-18 DIAGNOSIS — Z85118 Personal history of other malignant neoplasm of bronchus and lung: Secondary | ICD-10-CM | POA: Diagnosis not present

## 2020-02-18 NOTE — Assessment & Plan Note (Signed)
Stable, chronic.  Continue current medication.   Atorvastatin 10 mg daily

## 2020-02-18 NOTE — Progress Notes (Signed)
Patient ID: Hannah Green, female    DOB: 06-Mar-1945, 74 y.o.   MRN: [id number]  This visit was conducted in person.  BP 130/64   Pulse 83   Temp (!) 97.2 F (36.2 C) (Temporal)   Ht 5' 5.5" (1.664 m)   Wt 231 lb 4 oz (104.9 kg)   LMP  (LMP Unknown)   SpO2 97%   BMI 37.90 kg/m    CC:  Annual exam and review of chronic health issues Subjective:   HPI: Hannah Green is a 74 y.o. female presenting on 02/18/2020 for Annual Exam (Part 2)  The patient presents for complete physical and review of chronic health problems. He/She also has the following acute concerns today: Hand tremors onset 6 months ago.. off and on.  No family history of tremor or parkinson's  Occ notes when holding spoon.  Abnormal moles on body and concerning spot on her face- needs referral to dermatology Hs had nonhealing skin lesion on left face.  Health Maintenance: Flu- due Dexa- due, wants this scheduled along with her mammogram when it is due next year Abnormal Screenings: none  COPD: stable control on Trelegy and albuterol prn.  Currently in pulmonary rehab.  BP:  well controlled on no med.  Using medication without problems or lightheadedness:  none Chest pain with exertion: none Edema: some Short of breath:none Average home BPs: Other issues:   Wt Readings from Last 3 Encounters:  02/18/20 231 lb 4 oz (104.9 kg)  02/04/20 220 lb (99.8 kg)  01/31/20 220 lb (99.8 kg)     Elevated Cholesterol:LDL at goal on atorvastatin 10 mg daily Using medications without problems:none Muscle aches: none Diet compliance: moderate Exercise: in rehab Other complaints:  Non-small cell lung cancer: Followed by [facility] Dr. Julien Nordmann  S/P right upper lobectomy, no chemo or radiation needed.   RA:  Minimal issues   prediabetes:  Stable control. Lab Results  Component Value Date   HGBA1C 5.7 02/11/2020    Patient Care Team: Jinny Sanders, MD as PCP - General Leonie Man, MD as PCP -  Cardiology (Cardiology) Thelma Comp, [place] as Consulting Physician (Optometry) Vevelyn Royals, MD as Consulting Physician (Ophthalmology) Carloyn Manner, MD as Referring Physician (Otolaryngology) Deneise Lever, MD as Consulting Physician (Pulmonary Disease) Relevant past medical, surgical, family and social history reviewed and updated as indicated. Interim medical history since our last visit reviewed. Allergies and medications reviewed and updated. Outpatient Medications Prior to Visit  Medication Sig Dispense Refill  . albuterol (VENTOLIN HFA) 108 (90 Base) MCG/ACT inhaler TAKE 2 PUFFS BY MOUTH EVERY 6 HOURS AS NEEDED FOR WHEEZE OR SHORTNESS OF BREATH 8.5 g 3  . aspirin EC 81 MG tablet Take 1 tablet (81 mg total) by mouth daily. 90 tablet 3  . atorvastatin (LIPITOR) 10 MG tablet TAKE 1 TABLET BY MOUTH EVERY DAY 90 tablet 0  . cholecalciferol (VITAMIN D3) 25 MCG (1000 UT) tablet Take 1,000 Units by mouth daily.    . Fluticasone-Umeclidin-Vilant (TRELEGY ELLIPTA) 100-62.5-25 MCG/INH AEPB Inhale 1 puff into the lungs daily. 60 each 5   No facility-administered medications prior to visit.     Per HPI unless specifically indicated in ROS section below Review of Systems  Constitutional: Negative for fatigue and fever.  HENT: Negative for congestion.   Eyes: Negative for pain.  Respiratory: Negative for cough and shortness of breath.   Cardiovascular: Negative for chest pain, palpitations and leg swelling.  Gastrointestinal: Negative for abdominal  pain.  Genitourinary: Negative for dysuria and vaginal bleeding.  Musculoskeletal: Negative for back pain.  Neurological: Negative for syncope, light-headedness and headaches.  Psychiatric/Behavioral: Negative for dysphoric mood.   Objective:  BP 130/64   Pulse 83   Temp (!) 97.2 F (36.2 C) (Temporal)   Ht 5' 5.5" (1.664 m)   Wt 231 lb 4 oz (104.9 kg)   LMP  (LMP Unknown)   SpO2 97%   BMI 37.90 kg/m   Wt Readings from  Last 3 Encounters:  02/18/20 231 lb 4 oz (104.9 kg)  02/04/20 220 lb (99.8 kg)  01/31/20 220 lb (99.8 kg)      Physical Exam Constitutional:      General: She is not in acute distress.Vital signs are normal.     Appearance: Normal appearance. She is well-developed and well-nourished. She is not ill-appearing or toxic-appearing.  HENT:     Head: Normocephalic.     Right Ear: Hearing, tympanic membrane, ear canal and external ear normal.     Left Ear: Hearing, tympanic membrane, ear canal and external ear normal.     Nose: Nose normal.  Eyes:     General: Lids are normal. Lids are everted, no foreign bodies appreciated.     Extraocular Movements: EOM normal.     Conjunctiva/sclera: Conjunctivae normal.     Pupils: Pupils are equal, round, and reactive to light.  Neck:     Thyroid: No thyroid mass or thyromegaly.     Vascular: No carotid bruit.     Trachea: Trachea normal.  Cardiovascular:     Rate and Rhythm: Normal rate and regular rhythm.     Pulses: Intact distal pulses.     Heart sounds: Normal heart sounds, S1 normal and S2 normal. No murmur heard. No gallop.   Pulmonary:     Effort: Pulmonary effort is normal. No respiratory distress.     Breath sounds: Normal breath sounds. No wheezing, rhonchi or rales.  Abdominal:     General: Bowel sounds are normal. There is no distension or abdominal bruit.     Palpations: Abdomen is soft. There is no fluid wave, hepatosplenomegaly or mass.     Tenderness: There is no abdominal tenderness. There is no CVA tenderness, guarding or rebound.     Hernia: No hernia is present.  Musculoskeletal:     Cervical back: Normal range of motion and neck supple.  Lymphadenopathy:     Cervical: No cervical adenopathy.     Upper Body:  No axillary adenopathy present. Skin:    General: Skin is warm, dry and intact.     Findings: No rash.  Neurological:     Mental Status: She is alert.     Cranial Nerves: No cranial nerve deficit.     Sensory:  No sensory deficit.     Deep Tendon Reflexes: Strength normal.  Psychiatric:        Mood and Affect: Mood is not anxious or depressed.        Speech: Speech normal.        Behavior: Behavior normal. Behavior is cooperative.        Cognition and Memory: Cognition and memory normal.        Judgment: Judgment normal.       Results for orders placed or performed in visit on 02/11/20  VITAMIN D 25 Hydroxy (Vit-D Deficiency, Fractures)  Result Value Ref Range   VITD 26.74 (L) 30.00 - 100.00 ng/mL  Comprehensive metabolic panel  Result Value  Ref Range   Sodium 139 135 - 145 mEq/L   Potassium 4.1 3.5 - 5.1 mEq/L   Chloride 103 96 - 112 mEq/L   CO2 29 19 - 32 mEq/L   Glucose, Bld 106 (H) 70 - 99 mg/dL   BUN 19 6 - 23 mg/dL   Creatinine, Ser 0.69 0.40 - 1.20 mg/dL   Total Bilirubin 0.7 0.2 - 1.2 mg/dL   Alkaline Phosphatase 62 39 - 117 U/L   AST 18 0 - 37 U/L   ALT 15 0 - 35 U/L   Total Protein 6.9 6.0 - 8.3 g/dL   Albumin 3.9 3.5 - 5.2 g/dL   GFR 85.43 >60.00 mL/min   Calcium 9.3 8.4 - 10.5 mg/dL  Lipid panel  Result Value Ref Range   Cholesterol 179 0 - 200 mg/dL   Triglycerides 162.0 (H) 0.0 - 149.0 mg/dL   HDL 58.40 >39.00 mg/dL   VLDL 32.4 0.0 - 40.0 mg/dL   LDL Cholesterol 88 0 - 99 mg/dL   Total CHOL/HDL Ratio 3    NonHDL 120.23   Hemoglobin A1c  Result Value Ref Range   Hgb A1c MFr Bld 5.7 4.6 - 6.5 %    This visit occurred during the SARS-CoV-2 public health emergency.  Safety protocols were in place, including screening questions prior to the visit, additional usage of staff PPE, and extensive cleaning of exam room while observing appropriate contact time as indicated for disinfecting solutions.   COVID 19 screen:  No recent travel or known exposure to COVID19 The patient denies respiratory symptoms of COVID 19 at this time. The importance of social distancing was discussed today.   Assessment and Plan The patient's preventative maintenance and recommended  screening tests for an annual wellness exam were reviewed in full today. Brought up to date unless services declined.  Counselled on the importance of diet, exercise, and its role in overall health and mortality. The patient's FH and SH was reviewed, including their home life, tobacco status, and drug and alcohol status.    Vaccines: PCV13, 23 uptodate.  COVID x3 Given flu vaccine today. Due for shingles ...will consider. Mammo: 4/21/21Hx of breast CA Last pap age 73, no further indicated, No DVE , low risk, no family history of ovarian or uterine cancer. DEXA:12/2015, plan repeat in 2022 Colon: Last 02/2010 nml,  PER  Dr. Lorie Apley last note: Repeat in 5years. Had to reschedule... postpone for now... she refuses 2021.. but will do Cologuard Hep C: neg Former smoker, >40 years:  Hx of lung cancer spirometry 2020 severe COPD followed by pulmonary, Dr. Annamaria Boots  Problem List Items Addressed This Visit    COPD (chronic obstructive pulmonary disease) ([place]) (Chronic)    Stable, chronic.  Continue current medication.   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 Julien Nordmann  S/P right upper lobectomy, no chemo or radiation needed.       Hyperlipidemia with target LDL less than 100 (Chronic)    Stable, chronic.  Continue current medication.   Atorvastatin 10 mg daily      Prediabetes (Chronic)    Encouraged exercise, weight loss, healthy eating habits.       Routine general medical examination at a health care facility - Primary   Skin lesion of cheek    Referral to dermatology for eval and treat.      Relevant Orders  Ambulatory referral to Dermatology   Tremor   Relevant Orders   T4, free (Completed)   T3, free (Completed)   CBC with Differential/Platelet (Completed)   TSH (Completed)    Other Visit Diagnoses    Need for influenza vaccination       Relevant Orders   Flu  Vaccine QUAD High Dose(Fluad) (Completed)   Estrogen deficiency       Relevant Orders   DG Bone Density      Eliezer Lofts, MD

## 2020-02-18 NOTE — Patient Instructions (Addendum)
We  will call with referral information for dermatology referral in next several week.  get back on track with healthy eating . Low carb diet, increase exercisie as able.  Increase vit D to twice daily ( 2000 mg)  Decrease or  stop caffeine. Avoid decongestants.  Please stop at the lab to have labs drawn.   Please call the location of your choice from the menu below to schedule your Mammogram and/or Bone Density appointment.    [place]   1. [facility]                      Phone:  [phone], [place] [address]                                                             Services: Traditional and 3D Mammogram, Bone Density   2. [place] [facility]                 Phone: [phone] [address], [place] [address]    Service: Bone Density ONLY   *this site does NOT perform mammograms  3. [place]                        Phone:  [phone] [address] [place], [place] [address]                                            Services:  3D Mammogram and Bone Density    [place]  1. [place] [facility]   Phone:  [phone]   [place],  [address]                                            Services: 3D Mammogram and Bone Density  [place] at [facility])  Phone:  [phone]   [address] Room [place], [place] [address]                                              Services:  3D Mammogram and Bone Density

## 2020-02-18 NOTE — Assessment & Plan Note (Signed)
Followed by [facility] Dr. Julien Nordmann  S/P right upper lobectomy, no chemo or radiation needed.

## 2020-02-18 NOTE — Assessment & Plan Note (Signed)
Stable, chronic.  Continue current medication.   Trelegy and albuterol prn

## 2020-02-19 LAB — T4, FREE: Free T4: 0.78 ng/dL (ref 0.60–1.60)

## 2020-02-19 LAB — CBC WITH DIFFERENTIAL/PLATELET
Basophils Absolute: 0.1 10*3/uL (ref 0.0–0.1)
Basophils Relative: 0.8 % (ref 0.0–3.0)
Eosinophils Absolute: 0.2 10*3/uL (ref 0.0–0.7)
Eosinophils Relative: 2.2 % (ref 0.0–5.0)
HCT: 41.2 % (ref 36.0–46.0)
Hemoglobin: 13.9 g/dL (ref 12.0–15.0)
Lymphocytes Relative: 31.9 % (ref 12.0–46.0)
Lymphs Abs: 2.7 10*3/uL (ref 0.7–4.0)
MCHC: 33.6 g/dL (ref 30.0–36.0)
MCV: 92.4 fl (ref 78.0–100.0)
Monocytes Absolute: 0.6 10*3/uL (ref 0.1–1.0)
Monocytes Relative: 7.6 % (ref 3.0–12.0)
Neutro Abs: 4.8 10*3/uL (ref 1.4–7.7)
Neutrophils Relative %: 57.5 % (ref 43.0–77.0)
Platelets: 350 10*3/uL (ref 150.0–400.0)
RBC: 4.46 Mil/uL (ref 3.87–5.11)
RDW: 13.5 % (ref 11.5–15.5)
WBC: 8.3 10*3/uL (ref 4.0–10.5)

## 2020-02-19 LAB — T3, FREE: T3, Free: 2.9 pg/mL (ref 2.3–4.2)

## 2020-02-19 LAB — TSH: TSH: 3.02 u[IU]/mL (ref 0.35–4.50)

## 2020-02-20 ENCOUNTER — Ambulatory Visit: Payer: Medicare HMO

## 2020-02-26 ENCOUNTER — Encounter: Payer: Self-pay | Admitting: *Deleted

## 2020-02-26 DIAGNOSIS — J449 Chronic obstructive pulmonary disease, unspecified: Secondary | ICD-10-CM

## 2020-02-26 NOTE — Progress Notes (Signed)
Pulmonary Individual Treatment Plan  Patient Details  Name: Hannah Green MRN: [id number] Date of Birth: January 18, 1946 Referring Provider:   Flowsheet Row Pulmonary Rehab from 08/13/2019 in [facility] Cardiac and Pulmonary Rehab  Referring Provider [name] DO      Initial Encounter Date:  Flowsheet Row Pulmonary Rehab from 08/13/2019 in [facility] Cardiac and Pulmonary Rehab  Date 08/13/19      Visit Diagnosis: Stage 3 severe COPD by GOLD classification ([place])  Patient's Home Medications on Admission:  Current Outpatient Medications:  .  albuterol (VENTOLIN HFA) 108 (90 Base) MCG/ACT inhaler, TAKE 2 PUFFS BY MOUTH EVERY 6 HOURS AS NEEDED FOR WHEEZE OR SHORTNESS OF BREATH, Disp: 8.5 g, Rfl: 3 .  aspirin EC 81 MG tablet, Take 1 tablet (81 mg total) by mouth daily., Disp: 90 tablet, Rfl: 3 .  atorvastatin (LIPITOR) 10 MG tablet, TAKE 1 TABLET BY MOUTH EVERY DAY, Disp: 90 tablet, Rfl: 0 .  cholecalciferol (VITAMIN D3) 25 MCG (1000 UT) tablet, Take 1,000 Units by mouth daily., Disp: , Rfl:  .  Fluticasone-Umeclidin-Vilant (TRELEGY ELLIPTA) 100-62.5-25 MCG/INH AEPB, Inhale 1 puff into the lungs daily., Disp: 60 each, Rfl: 5  Past Medical History: Past Medical History:  Diagnosis Date  . Breast cancer ([place]) 1999  . Cataract 2017   resolved with surgery  . COPD (chronic obstructive pulmonary disease) ([place])   . Coronary artery disease   . GERD (gastroesophageal reflux disease)   . Hyperlipidemia   . Postoperative atrial fibrillation ([place]) 03/2018  . Rheumatoid arthritis(714.0)   . Squamous cell carcinoma of lung, stage I, right ([place]) 2020   Status post right upper and middle lobectomy    Tobacco Use: Social History   Tobacco Use  Smoking Status Former Smoker  . Packs/day: 1.50  . Years: 50.00  . Pack years: 75.00  . Types: Cigarettes  . Quit date: 04/01/2011  . Years since quitting: 8.9  Smokeless Tobacco Never Used  Tobacco Comment   has stopped but has started again in "crisis"     Labs: Recent Review Flowsheet Data    Labs for ITP Cardiac and Pulmonary Rehab Latest Ref Rng & Units 04/19/2018 04/23/2018 04/24/2018 12/11/2018 02/11/2020   Cholestrol 0 - 200 mg/dL - - - 218(H) 179   LDLCALC 0 - 99 mg/dL - - - 127(H) 88   LDLDIRECT mg/dL - - - - -   HDL >39.00 mg/dL - - - 57.90 58.40   Trlycerides 0.0 - 149.0 mg/dL - - - 165.0(H) 162.0(H)   Hemoglobin A1c 4.6 - 6.5 % - - - 5.9 5.7   PHART 7.350 - 7.450 TEST WILL BE CREDITED 7.425 7.354 - -   PCO2ART 32.0 - 48.0 mmHg TEST WILL BE CREDITED 41.1 48.4(H) - -   HCO3 20.0 - 28.0 mmol/L TEST WILL BE CREDITED 26.5 26.3 - -   TCO2 22 - 32 mmol/L - - - - -   ACIDBASEDEF 0.0 - 2.0 mmol/L TEST WILL BE CREDITED - - - -   O2SAT % TEST WILL BE CREDITED 97.7 98.6 - -       Pulmonary Assessment Scores:   UCSD: Self-administered rating of dyspnea associated with activities of daily living (ADLs) 6-point scale (0 = "not at all" to 5 = "maximal or unable to do because of breathlessness")  Scoring Scores range from 0 to 120.  Minimally important difference is 5 units  CAT: CAT can identify the health impairment of COPD patients and is better correlated with disease  progression.  CAT has a scoring range of zero to 40. The CAT score is classified into four groups of low (less than 10), medium (10 - 20), high (21-30) and very high (31-40) based on the impact level of disease on health status. A CAT score over 10 suggests significant symptoms.  A worsening CAT score could be explained by an exacerbation, poor medication adherence, poor inhaler technique, or progression of COPD or comorbid conditions.  CAT MCID is 2 points  mMRC: mMRC (Modified Medical Research Council) Dyspnea Scale is used to assess the degree of baseline functional disability in patients of respiratory disease due to dyspnea. No minimal important difference is established. A decrease in score of 1 point or greater is considered a positive change.   Pulmonary  Function Assessment:   Exercise Target Goals: Exercise Program Goal: Individual exercise prescription set using results from initial 6 min walk test and THRR while considering  patient's activity barriers and safety.   Exercise Prescription Goal: Initial exercise prescription builds to 30-45 minutes a day of aerobic activity, 2-3 days per week.  Home exercise guidelines will be given to patient during program as part of exercise prescription that the participant will acknowledge.  Education: Aerobic Exercise: - Group verbal and visual presentation on the components of exercise prescription. Introduces F.I.T.T principle from ACSM for exercise prescriptions.  Reviews F.I.T.T. principles of aerobic exercise including progression. Written material given at graduation. Flowsheet Row Pulmonary Rehab from 01/15/2020 in [facility] Cardiac and Pulmonary Rehab  Date 12/26/19  [name] only on 10/28]  Educator [facility]  Instruction Review Code 1- [employer] Understanding      Education: Resistance Exercise: - Group verbal and visual presentation on the components of exercise prescription. Introduces F.I.T.T principle from ACSM for exercise prescriptions  Reviews F.I.T.T. principles of resistance exercise including progression. Written material given at graduation.    Education: Exercise & Equipment Safety: - Individual verbal instruction and demonstration of equipment use and safety with use of the equipment. Flowsheet Row Pulmonary Rehab from 01/15/2020 in [facility] Cardiac and Pulmonary Rehab  Date 08/13/19  Educator [facility]  Instruction Review Code 1- Verbalizes Understanding      Education: Exercise Physiology & General Exercise Guidelines: - Group verbal and written instruction with models to review the exercise physiology of the cardiovascular system and associated critical values. Provides general exercise guidelines with specific guidelines to those with heart or lung disease.    Education:  Flexibility, Balance, Mind/Body Relaxation: - Group verbal and visual presentation with interactive activity on the components of exercise prescription. Introduces F.I.T.T principle from ACSM for exercise prescriptions. Reviews F.I.T.T. principles of flexibility and balance exercise training including progression. Also discusses the mind body connection.  Reviews various relaxation techniques to help reduce and manage stress (i.e. Deep breathing, progressive muscle relaxation, and visualization). Balance handout provided to take home. Written material given at graduation. Flowsheet Row Pulmonary Rehab from 01/15/2020 in [facility] Cardiac and Pulmonary Rehab  Date 01/02/20  Educator [facility]  Instruction Review Code 1- Verbalizes Understanding      Activity Barriers & Risk Stratification:   6 Minute Walk:  Oxygen Initial Assessment:   Oxygen Re-Evaluation:  Oxygen Re-Evaluation    [place] Name 09/03/19 1156 09/24/19 1150 11/21/19 1124 12/24/19 1134 01/09/20 1115     Program Oxygen Prescription   Program Oxygen Prescription _0      Home Oxygen   Home Oxygen Device _1    Sleep Oxygen Prescription None None None None  None   Home Exercise Oxygen Prescription _0    Home Resting Oxygen Prescription _1    Compliance with Home Oxygen Use Yes Yes Yes -- Yes     Goals/Expected Outcomes   Short Term Goals To learn and exhibit compliance with exercise, home and travel O2 prescription;To learn and understand importance of monitoring SPO2 with pulse oximeter and demonstrate accurate use of the pulse oximeter.;To learn and understand importance of maintaining oxygen saturations>88%;To learn and demonstrate proper pursed lip breathing techniques or other breathing techniques.;To learn and demonstrate proper use of respiratory medications To learn and exhibit compliance with exercise, home and travel O2 prescription;To learn and  understand importance of monitoring SPO2 with pulse oximeter and demonstrate accurate use of the pulse oximeter.;To learn and understand importance of maintaining oxygen saturations>88%;To learn and demonstrate proper pursed lip breathing techniques or other breathing techniques.;To learn and demonstrate proper use of respiratory medications To learn and exhibit compliance with exercise, home and travel O2 prescription;To learn and understand importance of monitoring SPO2 with pulse oximeter and demonstrate accurate use of the pulse oximeter.;To learn and understand importance of maintaining oxygen saturations>88%;To learn and demonstrate proper pursed lip breathing techniques or other breathing techniques.;To learn and demonstrate proper use of respiratory medications To learn and demonstrate proper use of respiratory medications To learn and demonstrate proper pursed lip breathing techniques or other breathing techniques.   Long  Term Goals Exhibits compliance with exercise, home and travel O2 prescription;Verbalizes importance of monitoring SPO2 with pulse oximeter and return demonstration;Maintenance of O2 saturations>88%;Exhibits proper breathing techniques, such as pursed lip breathing or other method taught during program session;Compliance with respiratory medication;Demonstrates proper use of MDI's Exhibits compliance with exercise, home and travel O2 prescription;Verbalizes importance of monitoring SPO2 with pulse oximeter and return demonstration;Maintenance of O2 saturations>88%;Exhibits proper breathing techniques, such as pursed lip breathing or other method taught during program session;Compliance with respiratory medication;Demonstrates proper use of MDI's Exhibits compliance with exercise, home and travel O2 prescription;Verbalizes importance of monitoring SPO2 with pulse oximeter and return demonstration;Maintenance of O2 saturations>88%;Exhibits proper breathing techniques, such as pursed  lip breathing or other method taught during program session;Compliance with respiratory medication;Demonstrates proper use of MDI's Demonstrates proper use of MDI's;Compliance with respiratory medication Exhibits proper breathing techniques, such as pursed lip breathing or other method taught during program session   Comments Hannah Green is using her PLB and finds it helpful. She does not have a pulse oximeter so we talked about getting one.  She will look into it. Hannah Green plans to add a day of exercise in addition to LW class.  Staff reviewed importance of checking O2 during exercise. Staff reviewed importance of checking O2 during exercise, encouraged to take back out her pulse ox. Practices PLB and reports getting better with it. Hannah Green has no questions about her respiratory medications and how to use them. She is taking her medications as prescribed. Diaphragmatic and PLB breathing explained and performed with patient. Patient has a better understanding of how to do these exercises to help with breathing performance and relaxation. Patient performed breathing techniques adequately and to practice further at home.   Goals/Expected Outcomes Short: Get pulse oximeter.  Long: Continue to work on PLB. Short: monitor O2 when exercising at home Long: use PLB as needed Short: monitor O2 when exercising at home Long: use PLB as needed Short: continue with medication regimine and ask any questions she may have about them. Long: maintain respiratory medications independently Short:  practice PLB and diaphragmatic breathing at home. Long: Use PLB and diaphragmatic breathing independently post [employer].          Oxygen Discharge (Final Oxygen Re-Evaluation):  Oxygen Re-Evaluation - 01/09/20 1115      Program Oxygen Prescription   Program Oxygen Prescription None      Home Oxygen   Home Oxygen Device None    Sleep Oxygen Prescription None    Home Exercise Oxygen Prescription None    Home Resting Oxygen Prescription  None    Compliance with Home Oxygen Use Yes      Goals/Expected Outcomes   Short Term Goals To learn and demonstrate proper pursed lip breathing techniques or other breathing techniques.    Long  Term Goals Exhibits proper breathing techniques, such as pursed lip breathing or other method taught during program session    Comments Diaphragmatic and PLB breathing explained and performed with patient. Patient has a better understanding of how to do these exercises to help with breathing performance and relaxation. Patient performed breathing techniques adequately and to practice further at home.    Goals/Expected Outcomes Short: practice PLB and diaphragmatic breathing at home. Long: Use PLB and diaphragmatic breathing independently post [employer].           Initial Exercise Prescription:   Perform Capillary Blood Glucose checks as needed.  Exercise Prescription Changes:  Exercise Prescription Changes    Row Name 09/10/19 1300 09/24/19 1300 10/08/19 1300 10/22/19 1300 11/05/19 1300     Response to Exercise   Blood Pressure (Admit) 146/70 114/66 130/60 128/64 122/66   Blood Pressure (Exercise) 148/68 142/68 164/72 150/62 146/50   Blood Pressure (Exit) 122/60 110/60 122/54 112/64 136/56   Heart Rate (Admit) 69 bpm 65 bpm 73 bpm 74 bpm 72 bpm   Heart Rate (Exercise) 97 bpm 87 bpm 103 bpm 106 bpm 107 bpm   Heart Rate (Exit) 80 bpm 79 bpm 77 bpm 80 bpm 84 bpm   Oxygen Saturation (Admit) 96 % 96 % 96 % 96 % 96 %   Oxygen Saturation (Exercise) 94 % 94 % 94 % 94 % 95 %   Oxygen Saturation (Exit) 98 % 93 % 96 % 97 % 98 %   Rating of Perceived Exertion (Exercise) _0 Perceived Dyspnea (Exercise) _1 Symptoms _2    Duration Continue with 30 min of aerobic exercise without signs/symptoms of physical distress. Continue with 30 min of aerobic exercise without signs/symptoms of physical distress. Continue with 30 min of aerobic exercise without  signs/symptoms of physical distress. Continue with 30 min of aerobic exercise without signs/symptoms of physical distress. Continue with 30 min of aerobic exercise without signs/symptoms of physical distress.   Intensity _3      Progression   Progression Continue to progress workloads to maintain intensity without signs/symptoms of physical distress. Continue to progress workloads to maintain intensity without signs/symptoms of physical distress. Continue to progress workloads to maintain intensity without signs/symptoms of physical distress. Continue to progress workloads to maintain intensity without signs/symptoms of physical distress. Continue to progress workloads to maintain intensity without signs/symptoms of physical distress.   Average METs 2.5 2.75 2.5 2.96 2.4     Resistance Training   Training Prescription _4    Weight 3 lb 3 lb 3 lb 3 lb 3 lb   Reps 10-15 10-15 10-15 10-15 10-15  Interval Training   Interval Training _0      Treadmill   MPH 1.9 1.9 1.8 1.9 2   Grade 0.5 0.5 0.5 0.5 0.5   Minutes _1 METs 2.6 2.59 2.59 2.59 2.67     NuStep   Level _2 SPM 80 -- 80 -- 80   Minutes _3 METs 2.4 2.9 2.4 3.3 2.1     REL-XR   Level -- 1 -- -- --   Minutes -- 15 -- -- --     Biostep-RELP   Level -- -- -- 1 --   Minutes -- -- -- 15 --   METs -- -- -- 3 --     Home Exercise Plan   Plans to continue exercise at -- Home (comment)  walking, also considering joining [employer] (comment)  walking, also considering joining [employer] (comment)  walking, also considering joining [employer] --   Frequency -- Add 2 additional days to program exercise sessions. Add 2 additional days to program exercise sessions. Add 2 additional days to program exercise sessions. --   Initial Home Exercises Provided -- 09/24/19 09/24/19 09/24/19 --   [place]  Name 11/19/19 1400 12/02/19 1000 12/30/19 1100 01/13/20 1100       Response to Exercise   Blood Pressure (Admit) 126/62 132/60 120/58 136/78    Blood Pressure (Exercise) 154/60 160/80 172/62 128/64    Blood Pressure (Exit) 122/70 106/60 138/60 124/76    Heart Rate (Admit) 70 bpm 73 bpm 65 bpm 66 bpm    Heart Rate (Exercise) 89 bpm 108 bpm 107 bpm 105 bpm    Heart Rate (Exit) 80 bpm 78 bpm 86 bpm 76 bpm    Oxygen Saturation (Admit) 94 % 98 % 95 % 95 %    Oxygen Saturation (Exercise) 96 % 96 % 95 % 93 %    Oxygen Saturation (Exit) 97 % 95 % 90 % 91 %    Rating of Perceived Exertion (Exercise) _4 Perceived Dyspnea (Exercise) _5 Symptoms none -- none none    Duration Continue with 30 min of aerobic exercise without signs/symptoms of physical distress. Continue with 30 min of aerobic exercise without signs/symptoms of physical distress. Continue with 30 min of aerobic exercise without signs/symptoms of physical distress. Continue with 30 min of aerobic exercise without signs/symptoms of physical distress.    Intensity THRR unchanged THRR unchanged THRR unchanged THRR unchanged         Progression   Progression Continue to progress workloads to maintain intensity without signs/symptoms of physical distress. Continue to progress workloads to maintain intensity without signs/symptoms of physical distress. Continue to progress workloads to maintain intensity without signs/symptoms of physical distress. Continue to progress workloads to maintain intensity without signs/symptoms of physical distress.    Average METs 2.89 3.2 2.8 2.83         Resistance Training   Training Prescription Yes Yes Yes Yes    Weight 3 lb 3 lb 3 lb 3 lb    Reps 10-15 10-15 10-15 10-15         Interval Training   Interval Training No No No No         Treadmill   MPH _6 1.8    Grade 0.5 0.5 0.5 0.5    Minutes  _0 METs 2.67 2.67 2.67 2.5         NuStep   Level _1 SPM  -- 80 80 --    Minutes _2 METs 3 3._3 Biostep-RELP   Level 1 -- -- 2    Minutes 15 -- -- 15    METs 3 -- -- 3         Home Exercise Plan   Plans to continue exercise at Home (comment)  walking, also considering joining [employer] -- Home (comment)  walking, also considering joining [employer] (comment)  walking, also considering joining [employer]    Frequency Add 2 additional days to program exercise sessions. -- Add 2 additional days to program exercise sessions. Add 2 additional days to program exercise sessions.    Initial Home Exercises Provided 09/24/19 -- 09/24/19 09/24/19           Exercise Comments:   Exercise Goals and Review:   Exercise Goals Re-Evaluation :  Exercise Goals Re-Evaluation    Row Name 09/03/19 1142 09/10/19 1305 09/24/19 1145 09/24/19 1335 10/08/19 1340     Exercise Goal Re-Evaluation   Exercise Goals Review Increase Physical Activity;Understanding of Exercise Prescription;Increase Strength and Stamina Increase Physical Activity;Understanding of Exercise Prescription;Increase Strength and Stamina Increase Physical Activity;Understanding of Exercise Prescription;Increase Strength and Stamina Increase Physical Activity;Understanding of Exercise Prescription;Increase Strength and Stamina Increase Physical Activity;Understanding of Exercise Prescription;Increase Strength and Stamina   Comments Hannah Green is doing well in rehab.  She is not a fan on the XR. We will look at moving her around.  She is planning to walk and swim while out on vacation next week. Hannah Green has increased her speed on TM.  Staff will follow up with her when she returns from vacation. Reviewed home exercise with pt today.  Pt plans to walk/staff videos for exercise.  Reviewed THR, pulse, RPE, sign and symptoms, pulse oximetery and when to call 911 or MD.  Also discussed weather considerations and indoor options.  Pt voiced understanding. Hannah Green is doing well in rehab.   She is now up to level 4 onte NuStep.  We will continue to monitor her progress. Hannah Green works at [employer] and reaches THR range during some of her sessions.  Staff will encourage increasing workloads slightly to maintain THR during session.   Expected Outcomes Short: Walk and swimmon vacation Long; Continue to improve stamina. Short: stay active on vacation Long: increase MET level Short: add one day of exercise in addition to program sessions Long:  increase stamina Short: Start to slowly bring up workloads  Long: Continue to improve stamina. Short:  increase workloads Long: improve MET level   Row Name 10/22/19 1349 11/05/19 1121 11/19/19 1452 11/21/19 1127 12/02/19 1032     Exercise Goal Re-Evaluation   Exercise Goals Review Increase Physical Activity;Understanding of Exercise Prescription;Increase Strength and Stamina Increase Physical Activity;Understanding of Exercise Prescription;Increase Strength and Stamina Increase Physical Activity;Understanding of Exercise Prescription;Increase Strength and Stamina Increase Physical Activity;Understanding of Exercise Prescription;Increase Strength and Stamina Increase Physical Activity;Understanding of Exercise Prescription;Increase Strength and Stamina   Comments Hannah Green has been doing well in rehab. She is now up to level 4 on the NuSep.  She is still on level 1 for BioStep, so we will encourage her to increase that workload.  We will continue to monitor her progress. Treadmill -  20 minutes 1x/week - 74mh. Discussed adding in weighted exercises and 150 minutes of cardio exercise. SDeshaunacontinues to have problems attending rehab consistently.  She was out all last week and called out again today.  Progression would be improved with better attendance. SDestyncontinues to have problems attending rehab consistently. Progression would be improved with better attendance. SHarleyquinnis exercising at home - treadmill and resistance learned during rehab - walks on the treadmill  20-30 minutes 2x/week, resistance every day. SAsyriahas increased MET level.  Staff will review correct THR/oxygen range for her exercise at home.   Expected Outcomes Short: Increase workload on BioStep. Long: Continue to improve stamina. Short: Increase home exercise Long: Continue to improve stamina. Short: Improved attendance for rehab  Long: Continue to increase activity levels Short: Improve attendance for rehab, continue to exercise at home, consistently use treadmill for 30 minutes Long: Continue to increase activity levels Short: continue exercise at home, attend consistently Long: increase overall stamina   Row Name 12/16/19 1545 12/24/19 1141 12/30/19 1132 01/09/20 1131 01/13/20 1119     Exercise Goal Re-Evaluation   Exercise Goals Review -- Increase Physical Activity;Increase Strength and Stamina Increase Physical Activity;Increase Strength and Stamina Increase Physical Activity;Increase Strength and Stamina Increase Physical Activity;Increase Strength and Stamina;Understanding of Exercise Prescription   Comments Out since last review.  Last attended on 11/26/19.  Average attendance is around once a week with 20 visits in 18 weeks. She was out of Rehab due to three friends passing away. Hannah Green now ready to get back on track with exercise. SKenitrahas been inconsistent with attendance.  She plans to get back consistently. Hannah Green stretches at home but states she is not doing enough to keep her heart rate up. She has a treadmill that she can use at home. She is going to try to use her treadmill more when the holidays are over. SMerrilyncame in early today to make up for an appointment that would take her out this week.  She is up to 2.6 METs on the NuStep.  We will continue to encourage improved attendance to continue to see progression.   Expected Outcomes Short: Return to consistent attendance  Long: Exercise at home Short: [place] regular [place] Long: graduate [place] Short: attend LW  consistently Long: improve stamina Short: get back into working out at home. Long: add more days to working out at home Short: Continue to attend consistently  Long: Continue to improve stamina   Row Name 02/11/20 0853             Exercise Goal Re-Evaluation   Comments Out since last review              Discharge Exercise Prescription (Final Exercise Prescription Changes):  Exercise Prescription Changes - 01/13/20 1100      Response to Exercise   Blood Pressure (Admit) 136/78    Blood Pressure (Exercise) 128/64    Blood Pressure (Exit) 124/76    Heart Rate (Admit) 66 bpm    Heart Rate (Exercise) 105 bpm    Heart Rate (Exit) 76 bpm    Oxygen Saturation (Admit) 95 %    Oxygen Saturation (Exercise) 93 %    Oxygen Saturation (Exit) 91 %    Rating of Perceived Exertion (Exercise) 13    Perceived Dyspnea (Exercise) 2    Symptoms none    Duration Continue with 30 min of aerobic exercise without signs/symptoms of physical distress.    Intensity THRR unchanged  Progression   Progression Continue to progress workloads to maintain intensity without signs/symptoms of physical distress.    Average METs 2.83      Resistance Training   Training Prescription Yes    Weight 3 lb    Reps 10-15      Interval Training   Interval Training No      Treadmill   MPH 1.8    Grade 0.5    Minutes 15    METs 2.5      NuStep   Level 4    Minutes 15    METs 3      Biostep-RELP   Level 2    Minutes 15    METs 3      Home Exercise Plan   Plans to continue exercise at Home (comment)   walking, also considering joining [employer]   Frequency Add 2 additional days to program exercise sessions.    Initial Home Exercises Provided 09/24/19           Nutrition:  Target Goals: Understanding of nutrition guidelines, daily intake of sodium <1541m, cholesterol <2043m calories 30% from fat and 7% or less from saturated fats, daily to have 5 or more servings of fruits and  vegetables.  Education: All About Nutrition: -Group instruction provided by verbal, written material, interactive activities, discussions, models, and posters to present general guidelines for heart healthy nutrition including fat, fiber, MyPlate, the role of sodium in heart healthy nutrition, utilization of the nutrition label, and utilization of this knowledge for meal planning. Follow up email sent as well. Written material given at graduation. [facility]  Date 01/09/20  [facility] Code 1- Verbalizes Understanding      Biometrics:    Nutrition Therapy Plan and Nutrition Goals:  Nutrition Therapy & Goals - 10/03/19 1130      Nutrition Therapy   Diet Pulmonary MNT, heart healthy eating    Drug/Food Interactions Statins/Certain Fruits    Protein (specify units) 80g    Fiber 25 grams    Whole Grain Foods 3 servings    Saturated Fats 12 max. grams    Fruits and Vegetables 5 servings/day    Sodium 1.5 grams      Personal Nutrition Goals   Nutrition Goal ST: add granola to ice cream, eat sweet potatos as a snack, add protein source to snack or breakfast LT: improve breathing (now 2/10)    Comments B: tries to eat when she gets up. An egg (soft boiled) toast or or english muffin (whole wheat) - butter. Coffee or hot tea (sugar free french vanilla creamer). S: L: tuna (mustard and green olives) with crackers (wheat thins (reduced fat)). Diet green tea. D: salmon, no red meat, some portk, mostly chicken or fish (baked). asparagus, lots of salad with other nonstarchy vegetables, baked vegetables (olive oil) S: loves to bake, ice cream Drinks: diet green tea and water Discussed heart healthy eaitng, pulmonary MNT, discussed adding one more good source of protein. Pt reports loving sweet potatos and would like to have granola on her ice cream (will find a healthy version)      Intervention Plan    Intervention Prescribe, educate and counsel regarding individualized specific dietary modifications aiming towards targeted core components such as weight, hypertension, lipid management, diabetes, heart failure and other comorbidities.;Nutrition handout(s) given to patient.    Expected Outcomes Short Term Goal: Understand basic principles of dietary content,  such as calories, fat, sodium, cholesterol and nutrients.;Short Term Goal: A plan has been developed with personal nutrition goals set during dietitian appointment.;Long Term Goal: Adherence to prescribed nutrition plan.           Nutrition Assessments:  MEDIFICTS Score Key:  ?70 Need to make dietary changes   40-70 Heart Healthy Diet  ? 40 Therapeutic Level Cholesterol Diet   Picture Your Plate Scores:  <66 Unhealthy dietary pattern with much room for improvement.  41-50 Dietary pattern unlikely to meet recommendations for good health and room for improvement.  51-60 More healthful dietary pattern, with some room for improvement.   >60 Healthy dietary pattern, although there may be some specific behaviors that could be improved.   Nutrition Goals Re-Evaluation:  Nutrition Goals Re-Evaluation    [place] Name 09/03/19 1157 11/05/19 1135 11/21/19 1115 12/24/19 1143 01/09/20 1127     Goals   Current Weight -- -- -- 226 lb (102.5 kg) 233 lb (105.7 kg)   Nutrition Goal Meet with dietician ST: continue current changes LT: improve breathing (now 3/10) ST: continue current changes, reduce sugar LT: improve breathing (now 3/10) Lose weight and watch sugar intake. Eat less sugar and lose weight   Comment Timothea has already to started to make some healthier changes in her diet and seeing the difference in her weight and how she is feeling. She tries new recipes and has a heart healthy diet. continue current chnages. Kaytlen reports doing well with her nutrition and is still trying to cut back on sugar, discussed restirciton and cravings - she  still wants to quit cold [place]. Reports still eating well and would like to continue with her current changes. She finds it hard not to eat ice cream. She eats ice cream about twice a week from every night. She does not want to be a diabetic. Matilynn is going to try to eat less sugar and try to cut down on her portion sizes.   Expected Outcome Short: Meet with dietcian Long: Continue to work on healthy changes. ST: continue current changes LT: improve breathing (now 3/10) ST: continue current changes, reduce sugar LT: improve breathing (now 4/10) Short: cut out more sugar in her diet. Long: maintain diet independently that works for her. Short: reduce sugar intake. Long:lose 5 pounds in three weeks.          Nutrition Goals Discharge (Final Nutrition Goals Re-Evaluation):  Nutrition Goals Re-Evaluation - 01/09/20 1127      Goals   Current Weight 233 lb (105.7 kg)    Nutrition Goal Eat less sugar and lose weight    Comment Hannah Green is going to try to eat less sugar and try to cut down on her portion sizes.    Expected Outcome Short: reduce sugar intake. Long:lose 5 pounds in three weeks.           Psychosocial: Target Goals: Acknowledge presence or absence of significant depression and/or stress, maximize coping skills, provide positive support system. Participant is able to verbalize types and ability to use techniques and skills needed for reducing stress and depression.   Education: Stress, Anxiety, and Depression - Group verbal and visual presentation to define topics covered.  Reviews how body is impacted by stress, anxiety, and depression.  Also discusses healthy ways to reduce stress and to treat/manage anxiety and depression.  Written material given at graduation.   Education: Sleep Hygiene -Provides group verbal and written instruction about how sleep can affect your health.  Define sleep  hygiene, discuss sleep cycles and impact of sleep habits. Review good sleep hygiene tips.   Flowsheet Row Pulmonary Rehab from 01/15/2020 in [facility] Cardiac and Pulmonary Rehab  Date 09/05/19  Educator [facility]  Instruction Review Code 1- Verbalizes Understanding      Initial Review & Psychosocial Screening:   Quality of Life Scores:  Scores of 19 and below usually indicate a poorer quality of life in these areas.  A difference of  2-3 points is a clinically meaningful difference.  A difference of 2-3 points in the total score of the Quality of Life Index has been associated with significant improvement in overall quality of life, self-image, physical symptoms, and general health in studies assessing change in quality of life.  PHQ-9: Recent Review Flowsheet Data    Depression screen [facility] 2/9 02/14/2020 01/09/2020 08/13/2019 12/11/2018 12/05/2017   Decreased Interest 0 0 0 0 0   Down, Depressed, Hopeless 0 0 0 0 1   PHQ - 2 Score 0 0 0 0 1   Altered sleeping 0 3 1 0 2   Tired, decreased energy 0 3 2 0 1   Change in appetite 0 2 2 0 2   Feeling bad or failure about yourself  0 0 0 0 1   Trouble concentrating 0 0 0 0 0   Moving slowly or fidgety/restless 0 0 0 0 0   Suicidal thoughts 0 0 0 0 0   PHQ-9 Score 0 8 5 0 7   Difficult doing work/chores Not difficult at all Somewhat difficult Somewhat difficult Not difficult at all Not difficult at all     Interpretation of Total Score  Total Score Depression Severity:  1-4 = Minimal depression, 5-9 = Mild depression, 10-14 = Moderate depression, 15-19 = Moderately severe depression, 20-27 = Severe depression   Psychosocial Evaluation and Intervention:   Psychosocial Re-Evaluation:  Psychosocial Re-Evaluation    Row Name 09/03/19 1146 09/24/19 1138 11/05/19 1125 11/21/19 1122 12/24/19 1138     Psychosocial Re-Evaluation   Current issues with Current Stress Concerns;Current Sleep Concerns -- Current Stress Concerns;Current Sleep Concerns Current Sleep Concerns Current Sleep Concerns;Current Stress Concerns   Comments Hannah Green is doing  well in rehab.  She is going to the beach for the week next week to [facility]. She is looking forward to her trip to just get away for a few days.  She continues to have trouble sleeping, so I sent her information to review. Hannah Green still doesnt sleep well.  She has trouble going back to sleep if she gets up.  Other than sleep, she feels good mentally. Her family gives her stress and her plumbing has been backed up which is causing her stress. Hannah Green is still has trouble sleeping and thinks she may need a new mattress in addition to stress. Discussed sleep hygiene. Hannah Green rpeorts her stress is better, but reports her sleep is poor. She reports having to wake up to go to the bathroom. Manika states that her sleep is better. She is the kind of person that once she wakes up she is awake for the day. She is going to bed earlier to help her sleep through the night.   Expected Outcomes Short: Review sleep info and enjoy vacation Long; Continue to stay positive. Short: continue to work on sleeping better Long: maintain positive outlook Short: continue to work on sleeping better Long: maintain positive outlook Short: continue to work on sleeping better, ask PCP about pelvic floor therapy to help with  frequent urination Long: maintain positive outlook Short: continue going to bed early for adequate sleep. Long: maintain good sleeping habits to keep stress at a minimum.   Interventions Encouraged to attend Pulmonary Rehabilitation for the exercise;Stress management education -- Encouraged to attend Pulmonary Rehabilitation for the exercise;Stress management education Encouraged to attend Pulmonary Rehabilitation for the exercise Encouraged to attend Pulmonary Rehabilitation for the exercise   Continue Psychosocial Services  Follow up required by staff -- Follow up required by staff Follow up required by staff Follow up required by staff   Comments -- -- working on stress and sleep management sleep management --      Initial Review   Source of Stress Concerns -- -- Chronic Illness;Unable to participate in former interests or hobbies Chronic Illness;Unable to participate in former interests or hobbies --   [place] Name 01/09/20 1121             Psychosocial Re-Evaluation   Current issues with Current Stress Concerns;Current Sleep Concerns       Comments Reviewed patient health questionnaire (PHQ-9) with patient for follow up. Previously, patients score indicated signs/symptoms of depression.  Reviewed to see if patient is improving symptom wise while in program.  Score declined and patient states that it is because she has not been sleeping well and has been eating too much. She is going to start eating less and not so late.       Expected Outcomes Short: Continue to work toward an improvement in [place] scores by attending [facility] regularly. Long: Continue to improve stress and depression coping skills by talking with staff and attending [facility] regularly and work toward a positive mental state.       Interventions Encouraged to attend Pulmonary Rehabilitation for the exercise       Continue Psychosocial Services  Follow up required by staff              Psychosocial Discharge (Final Psychosocial Re-Evaluation):  Psychosocial Re-Evaluation - 01/09/20 1121      Psychosocial Re-Evaluation   Current issues with Current Stress Concerns;Current Sleep Concerns    Comments Reviewed patient health questionnaire (PHQ-9) with patient for follow up. Previously, patients score indicated signs/symptoms of depression.  Reviewed to see if patient is improving symptom wise while in program.  Score declined and patient states that it is because she has not been sleeping well and has been eating too much. She is going to start eating less and not so late.    Expected Outcomes Short: Continue to work toward an improvement in [place] scores by attending [facility] regularly. Long: Continue to improve stress  and depression coping skills by talking with staff and attending [facility] regularly and work toward a positive mental state.    Interventions Encouraged to attend Pulmonary Rehabilitation for the exercise    Continue Psychosocial Services  Follow up required by staff           Education: Education Goals: Education classes will be provided on a weekly basis, covering required topics. Participant will state understanding/return demonstration of topics presented.  Learning Barriers/Preferences:   General Pulmonary Education Topics:  Infection Prevention: - Provides verbal and written material to individual with discussion of infection control including proper hand washing and proper equipment cleaning during exercise session. Flowsheet Row Pulmonary Rehab from 01/15/2020 in [facility] Cardiac and Pulmonary Rehab  Date 08/13/19  Educator [facility]  Instruction Review Code 1- Verbalizes Understanding      Falls Prevention: - Provides verbal and written material  to individual with discussion of falls prevention and safety. Flowsheet Row Pulmonary Rehab from 01/15/2020 in [facility] Cardiac and Pulmonary Rehab  Date 08/13/19  Educator [facility]  Instruction Review Code 1- Verbalizes Understanding      Chronic Lung Disease Review: - Group verbal instruction with posters, models, PowerPoint presentations and videos,  to review new updates, new respiratory medications, new advancements in procedures and treatments. Providing information on websites and "800" numbers for continued self-education. Includes information about supplement oxygen, available portable oxygen systems, continuous and intermittent flow rates, oxygen safety, concentrators, and Medicare reimbursement for oxygen. Explanation of Pulmonary Drugs, including class, frequency, complications, importance of spacers, rinsing mouth after steroid MDI's, and proper cleaning methods for nebulizers. Review of basic lung anatomy and physiology related to  function, structure, and complications of lung disease. Review of risk factors. Discussion about methods for diagnosing sleep apnea and types of masks and machines for OSA. Includes a review of the use of types of environmental controls: home humidity, furnaces, filters, dust mite/pet prevention, HEPA vacuums. Discussion about weather changes, air quality and the benefits of nasal washing. Instruction on Warning signs, infection symptoms, calling MD promptly, preventive modes, and value of vaccinations. Review of effective airway clearance, coughing and/or vibration techniques. Emphasizing that all should Create an Action Plan. Written material given at graduation. Flowsheet Row Pulmonary Rehab from 01/15/2020 in [facility] Cardiac and Pulmonary Rehab  Date 09/19/19  Educator jh  Instruction Review Code 1- Verbalizes Understanding      AED/CPR: - Group verbal and written instruction with the use of models to demonstrate the basic use of the AED with the basic ABC's of resuscitation.    Anatomy and Cardiac Procedures: - Group verbal and visual presentation and models provide information about basic cardiac anatomy and function. Reviews the testing methods done to diagnose heart disease and the outcomes of the test results. Describes the treatment choices: Medical Management, Angioplasty, or Coronary Bypass Surgery for treating various heart conditions including Myocardial Infarction, Angina, Valve Disease, and Cardiac Arrhythmias.  Written material given at graduation. Flowsheet Row Pulmonary Rehab from 01/15/2020 in [facility] Cardiac and Pulmonary Rehab  Date 12/26/19  Educator SB  Instruction Review Code 1- Verbalizes Understanding      Medication Safety: - Group verbal and visual instruction to review commonly prescribed medications for heart and lung disease. Reviews the medication, class of the drug, and side effects. Includes the steps to properly store meds and maintain the prescription regimen.   Written material given at graduation. Flowsheet Row Pulmonary Rehab from 01/15/2020 in [facility] Cardiac and Pulmonary Rehab  Date 01/15/20  Educator SB  Instruction Review Code 1- Verbalizes Understanding      Other: -Provides group and verbal instruction on various topics (see comments)   Knowledge Questionnaire Score:    Core Components/Risk Factors/Patient Goals at Admission:   Education:Diabetes - Individual verbal and written instruction to review signs/symptoms of diabetes, desired ranges of glucose level fasting, after meals and with exercise. Acknowledge that pre and post exercise glucose checks will be done for 3 sessions at entry of program.   Know Your Numbers and Heart Failure: - Group verbal and visual instruction to discuss disease risk factors for cardiac and pulmonary disease and treatment options.  Reviews associated critical values for Overweight/Obesity, Hypertension, Cholesterol, and Diabetes.  Discusses basics of heart failure: signs/symptoms and treatments.  Introduces Heart Failure Zone chart for action plan for heart failure.  Written material given at graduation.   Core Components/Risk Factors/Patient Goals Review:  Goals and Risk Factor Review    Row Name 09/03/19 1144 09/24/19 1135 11/05/19 1134 11/21/19 1126 12/24/19 1136     Core Components/Risk Factors/Patient Goals Review   Personal Goals Review Weight Management/Obesity;Improve shortness of breath with ADL's;Hypertension Weight Management/Obesity;Improve shortness of breath with ADL's;Hypertension Weight Management/Obesity;Improve shortness of breath with ADL's;Hypertension Weight Management/Obesity;Improve shortness of breath with ADL's;Hypertension Improve shortness of breath with ADL's   Review Hannah Green is doing well in rehab.  Her weight is trending down.  She just doesn't like our scale compared to hers at home. Her blood pressures are good in class and she does not check them at home.  Her doctor wants  her to check it.  Her breathing is doing well. Gladie just got back from vacation so her weight may be up.  She doesnt monitor BP at home.  She has a time scheduled to meet with RD about diet. She doesnt monitor BP at home, she has a cuff at home and will start monitoring it. She doesnt monitor BP at home, she has a cuff at home and will start monitoring it - encouraged checking it 1x/day not at rehab. Improving SOB and practicing PLB. Sometimes it is challenging for her to do her daily activities at home. She has more trouble with bending over and picking things up.Spoke to patient about their shortness of breath and what they can do to improve. Patient has been informed of breathing techniques when starting the program. Patient is informed to tell staff if they have had any med changes and that certain meds they are taking or not taking can be causing shortness of breath.   Expected Outcomes Short: Start checking blood pressures. Long: Continue to monitor risk factors. Short: neet with RD Long: manage risk factors Short: PLB anf monitor BP Long: manage risk factors Short: PLB and monitor BP Long: manage risk factors Short: Attend [facility] regularly to improve shortness of breath with ADL's. Long: maintain independence with ADL's   [place] Name 01/09/20 1117             Core Components/Risk Factors/Patient Goals Review   Personal Goals Review Improve shortness of breath with ADL's       Review Spoke to patient about their shortness of breath and what they can do to improve. Patient has been informed of breathing techniques when starting the program. Patient is informed to tell staff if they have had any med changes and that certain meds they are taking or not taking can be causing shortness of breath.       Expected Outcomes Short: Attend [facility] regularly to improve shortness of breath with ADL's. Long: maintain independence with ADL's              Core Components/Risk Factors/Patient Goals at  Discharge (Final Review):   Goals and Risk Factor Review - 01/09/20 1117      Core Components/Risk Factors/Patient Goals Review   Personal Goals Review Improve shortness of breath with ADL's    Review Spoke to patient about their shortness of breath and what they can do to improve. Patient has been informed of breathing techniques when starting the program. Patient is informed to tell staff if they have had any med changes and that certain meds they are taking or not taking can be causing shortness of breath.    Expected Outcomes Short: Attend [facility] regularly to improve shortness of breath with ADL's. Long: maintain independence with ADL's  ITP Comments:  ITP Comments    Row Name 09/11/19 1050 10/09/19 0629 11/06/19 1615 11/19/19 1451 12/04/19 0603   ITP Comments 30 Day review completed. Medical Director ITP review done, changes made as directed, and signed approval by Medical Director. 30 Day review completed. Medical Director ITP review done, changes made as directed, and signed approval by Medical Director. 30 day review completed. ITP sent to Dr. Emily Filbert, Medical Director of Cardiac and Pulmonary Rehab. Continue with ITP unless changes are made by physician. Called out today with MD appt 30 Day review completed. Medical Director ITP review done, changes made as directed, and signed approval by Medical Director.   [place] Name 12/16/19 1544 12/18/19 1507 01/01/20 0708 01/29/20 1027 01/30/20 1115   ITP Comments Pt continues to call out for various reasons.  Last attended on 11/26/2019.  She has only completed 20 visits since 08/13/19 (averages around once a week). Called to check on patient.  She stated that she has had a lot going on recently.  She has only attended 20 times in 18 weeks.  She was encouraged to improve her attendance to get full benefit of program.  She intends to be here tomorrow. 30 Day review completed. Medical Director ITP review done, changes made as directed, and  signed approval by Medical Director. 30 Day review completed. Medical Director ITP review done, changes made as directed, and signed approval by Medical Director. Hannah Green has not attended since last review.   [place] Name 02/11/20 5409 02/25/20 1414 02/26/20 0615       ITP Comments Been out sick with sinus infection/urinary tract infection Hannah Green has not attended since last review. 30 Day review completed. Medical Director ITP review done, changes made as directed, and signed approval by Medical Director.  No visits in Dec            Comments:

## 2020-02-27 ENCOUNTER — Telehealth: Payer: Self-pay

## 2020-02-27 ENCOUNTER — Encounter: Payer: Self-pay | Admitting: *Deleted

## 2020-02-27 NOTE — Progress Notes (Signed)
Discharge Progress Report  Patient Details  Name: Hannah Green MRN: [id number] Date of Birth: Oct 29, 1945 Referring Provider:   Flowsheet Row Pulmonary Rehab from 08/13/2019 in [facility]  Referring Provider [name] DO       Number of Visits: 28  Reason for Discharge:  Early Exit:  Personal  Smoking History:  Social History   Tobacco Use  Smoking Status Former Smoker  . Packs/day: 1.50  . Years: 50.00  . Pack years: 75.00  . Types: Cigarettes  . Quit date: 04/01/2011  . Years since quitting: 8.9  Smokeless Tobacco Never Used  Tobacco Comment   has stopped but has started again in "crisis"    Diagnosis:  No diagnosis found.  ADL [facility]:   Initial Exercise Prescription:   Discharge Exercise Prescription (Final Exercise Prescription Changes):  Exercise Prescription Changes - 01/13/20 1100      Response to Exercise   Blood Pressure (Admit) 136/78    Blood Pressure (Exercise) 128/64    Blood Pressure (Exit) 124/76    Heart Rate (Admit) 66 bpm    Heart Rate (Exercise) 105 bpm    Heart Rate (Exit) 76 bpm    Oxygen Saturation (Admit) 95 %    Oxygen Saturation (Exercise) 93 %    Oxygen Saturation (Exit) 91 %    Rating of Perceived Exertion (Exercise) 13    Perceived Dyspnea (Exercise) 2    Symptoms none    Duration Continue with 30 min of aerobic exercise without signs/symptoms of physical distress.    Intensity THRR unchanged      Progression   Progression Continue to progress workloads to maintain intensity without signs/symptoms of physical distress.    Average METs 2.83      Resistance Training   Training Prescription Yes    Weight 3 lb    Reps 10-15      Interval Training   Interval Training No      Treadmill   MPH 1.8    Grade 0.5    Minutes 15    METs 2.5      NuStep   Level 4    Minutes 15    METs 3      Biostep-RELP   Level 2    Minutes 15    METs 3      Home Exercise Plan   Plans to continue exercise  at Home (comment)   walking, also considering joining [employer]   Frequency Add 2 additional days to program exercise sessions.    Initial Home Exercises Provided 09/24/19           Functional Capacity:   Psychological, QOL, Others - Outcomes: PHQ 2/9: Depression screen [facility] 2/9 02/14/2020 01/09/2020 08/13/2019 12/11/2018 12/05/2017  Decreased Interest 0 0 0 0 0  Down, Depressed, Hopeless 0 0 0 0 1  PHQ - 2 Score 0 0 0 0 1  Altered sleeping 0 3 1 0 2  Tired, decreased energy 0 3 2 0 1  Change in appetite 0 2 2 0 2  Feeling bad or failure about yourself  0 0 0 0 1  Trouble concentrating 0 0 0 0 0  Moving slowly or fidgety/restless 0 0 0 0 0  Suicidal thoughts 0 0 0 0 0  PHQ-9 Score 0 8 5 0 7  Difficult doing work/chores Not difficult at all Somewhat difficult Somewhat difficult Not difficult at all Not difficult at all  Some recent data might be hidden  Nutrition & Weight - Outcomes:   Nutrition:  Nutrition Therapy & Goals - 10/03/19 1130      Nutrition Therapy   Diet Pulmonary MNT, heart healthy eating    Drug/Food Interactions Statins/Certain Fruits    Protein (specify units) 80g    Fiber 25 grams    Whole Grain Foods 3 servings    Saturated Fats 12 max. grams    Fruits and Vegetables 5 servings/day    Sodium 1.5 grams      Personal Nutrition Goals   Nutrition Goal ST: add granola to ice cream, eat sweet potatos as a snack, add protein source to snack or breakfast LT: improve breathing (now 2/10)    Comments B: tries to eat when she gets up. An egg (soft boiled) toast or or english muffin (whole wheat) - butter. Coffee or hot tea (sugar free french vanilla creamer). S: L: tuna (mustard and green olives) with crackers (wheat thins (reduced fat)). Diet green tea. D: salmon, no red meat, some portk, mostly chicken or fish (baked). asparagus, lots of salad with other nonstarchy vegetables, baked vegetables (olive oil) S: loves to bake, ice cream Drinks: diet green tea and  water Discussed heart healthy eaitng, pulmonary MNT, discussed adding one more good source of protein. Pt reports loving sweet potatos and would like to have granola on her ice cream (will find a healthy version)      Intervention Plan   Intervention Prescribe, educate and counsel regarding individualized specific dietary modifications aiming towards targeted core components such as weight, hypertension, lipid management, diabetes, heart failure and other comorbidities.;Nutrition handout(s) given to patient.    Expected Outcomes Short Term Goal: Understand basic principles of dietary content, such as calories, fat, sodium, cholesterol and nutrients.;Short Term Goal: A plan has been developed with personal nutrition goals set during dietitian appointment.;Long Term Goal: Adherence to prescribed nutrition plan.           Nutrition Discharge:   Education Questionnaire Score:   Goals reviewed with patient; copy given to patient.

## 2020-02-27 NOTE — Progress Notes (Signed)
Pulmonary Individual Treatment Plan  Patient Details  Name: Hannah Green MRN: [id number] Date of Birth: 1945/09/25 Referring Provider:   Flowsheet Row Pulmonary Rehab from 08/13/2019 in [facility] Cardiac and Pulmonary Rehab  Referring Provider [name] DO      Initial Encounter Date:  Flowsheet Row Pulmonary Rehab from 08/13/2019 in [facility] Cardiac and Pulmonary Rehab  Date 08/13/19      Visit Diagnosis: No diagnosis found.  Patient's Home Medications on Admission:  Current Outpatient Medications:  .  albuterol (VENTOLIN HFA) 108 (90 Base) MCG/ACT inhaler, TAKE 2 PUFFS BY MOUTH EVERY 6 HOURS AS NEEDED FOR WHEEZE OR SHORTNESS OF BREATH, Disp: 8.5 g, Rfl: 3 .  aspirin EC 81 MG tablet, Take 1 tablet (81 mg total) by mouth daily., Disp: 90 tablet, Rfl: 3 .  atorvastatin (LIPITOR) 10 MG tablet, TAKE 1 TABLET BY MOUTH EVERY DAY, Disp: 90 tablet, Rfl: 0 .  cholecalciferol (VITAMIN D3) 25 MCG (1000 UT) tablet, Take 1,000 Units by mouth daily., Disp: , Rfl:  .  Fluticasone-Umeclidin-Vilant (TRELEGY ELLIPTA) 100-62.5-25 MCG/INH AEPB, Inhale 1 puff into the lungs daily., Disp: 60 each, Rfl: 5  Past Medical History: Past Medical History:  Diagnosis Date  . Breast cancer ([place]) 1999  . Cataract 2017   resolved with surgery  . COPD (chronic obstructive pulmonary disease) ([place])   . Coronary artery disease   . GERD (gastroesophageal reflux disease)   . Hyperlipidemia   . Postoperative atrial fibrillation ([place]) 03/2018  . Rheumatoid arthritis(714.0)   . Squamous cell carcinoma of lung, stage I, right ([place]) 2020   Status post right upper and middle lobectomy    Tobacco Use: Social History   Tobacco Use  Smoking Status Former Smoker  . Packs/day: 1.50  . Years: 50.00  . Pack years: 75.00  . Types: Cigarettes  . Quit date: 04/01/2011  . Years since quitting: 8.9  Smokeless Tobacco Never Used  Tobacco Comment   has stopped but has started again in "crisis"    Labs: Recent Review  Flowsheet Data    Labs for ITP Cardiac and Pulmonary Rehab Latest Ref Rng & Units 04/19/2018 04/23/2018 04/24/2018 12/11/2018 02/11/2020   Cholestrol 0 - 200 mg/dL - - - 218(H) 179   LDLCALC 0 - 99 mg/dL - - - 127(H) 88   LDLDIRECT mg/dL - - - - -   HDL >39.00 mg/dL - - - 57.90 58.40   Trlycerides 0.0 - 149.0 mg/dL - - - 165.0(H) 162.0(H)   Hemoglobin A1c 4.6 - 6.5 % - - - 5.9 5.7   PHART 7.350 - 7.450 TEST WILL BE CREDITED 7.425 7.354 - -   PCO2ART 32.0 - 48.0 mmHg TEST WILL BE CREDITED 41.1 48.4(H) - -   HCO3 20.0 - 28.0 mmol/L TEST WILL BE CREDITED 26.5 26.3 - -   TCO2 22 - 32 mmol/L - - - - -   ACIDBASEDEF 0.0 - 2.0 mmol/L TEST WILL BE CREDITED - - - -   O2SAT % TEST WILL BE CREDITED 97.7 98.6 - -       Pulmonary Assessment Scores:   UCSD: Self-administered rating of dyspnea associated with activities of daily living (ADLs) 6-point scale (0 = "not at all" to 5 = "maximal or unable to do because of breathlessness")  Scoring Scores range from 0 to 120.  Minimally important difference is 5 units  CAT: CAT can identify the health impairment of COPD patients and is better correlated with disease progression.  CAT has a  scoring range of zero to 40. The CAT score is classified into four groups of low (less than 10), medium (10 - 20), high (21-30) and very high (31-40) based on the impact level of disease on health status. A CAT score over 10 suggests significant symptoms.  A worsening CAT score could be explained by an exacerbation, poor medication adherence, poor inhaler technique, or progression of COPD or comorbid conditions.  CAT MCID is 2 points  mMRC: mMRC (Modified Medical Research Council) Dyspnea Scale is used to assess the degree of baseline functional disability in patients of respiratory disease due to dyspnea. No minimal important difference is established. A decrease in score of 1 point or greater is considered a positive change.   Pulmonary Function  Assessment:   Exercise Target Goals: Exercise Program Goal: Individual exercise prescription set using results from initial 6 min walk test and THRR while considering  patient's activity barriers and safety.   Exercise Prescription Goal: Initial exercise prescription builds to 30-45 minutes a day of aerobic activity, 2-3 days per week.  Home exercise guidelines will be given to patient during program as part of exercise prescription that the participant will acknowledge.  Education: Aerobic Exercise: - Group verbal and visual presentation on the components of exercise prescription. Introduces F.I.T.T principle from ACSM for exercise prescriptions.  Reviews F.I.T.T. principles of aerobic exercise including progression. Written material given at graduation. Flowsheet Row Pulmonary Rehab from 01/15/2020 in [facility] Cardiac and Pulmonary Rehab  Date 12/26/19  [name] only on 10/28]  Educator [facility]  Instruction Review Code 1- [employer] Understanding      Education: Resistance Exercise: - Group verbal and visual presentation on the components of exercise prescription. Introduces F.I.T.T principle from ACSM for exercise prescriptions  Reviews F.I.T.T. principles of resistance exercise including progression. Written material given at graduation.    Education: Exercise & Equipment Safety: - Individual verbal instruction and demonstration of equipment use and safety with use of the equipment. Flowsheet Row Pulmonary Rehab from 01/15/2020 in [facility] Cardiac and Pulmonary Rehab  Date 08/13/19  Educator [facility]  Instruction Review Code 1- Verbalizes Understanding      Education: Exercise Physiology & General Exercise Guidelines: - Group verbal and written instruction with models to review the exercise physiology of the cardiovascular system and associated critical values. Provides general exercise guidelines with specific guidelines to those with heart or lung disease.    Education: Flexibility,  Balance, Mind/Body Relaxation: - Group verbal and visual presentation with interactive activity on the components of exercise prescription. Introduces F.I.T.T principle from ACSM for exercise prescriptions. Reviews F.I.T.T. principles of flexibility and balance exercise training including progression. Also discusses the mind body connection.  Reviews various relaxation techniques to help reduce and manage stress (i.e. Deep breathing, progressive muscle relaxation, and visualization). Balance handout provided to take home. Written material given at graduation. Flowsheet Row Pulmonary Rehab from 01/15/2020 in [facility] Pulmonary Rehab  Date 01/02/20  Educator [facility]  Instruction Review Code 1- Verbalizes Understanding      Activity Barriers & Risk Stratification:   6 Minute Walk:  Oxygen Initial Assessment:   Oxygen Re-Evaluation:  Oxygen Re-Evaluation    [place] Name 09/03/19 1156 09/24/19 1150 11/21/19 1124 12/24/19 1134 01/09/20 1115     Program Oxygen Prescription   Program Oxygen Prescription None None None None None     Home Oxygen   Home Oxygen Device None None None None None   Sleep Oxygen Prescription None None None None None   Home Exercise  Oxygen Prescription None None None None None   Home Resting Oxygen Prescription None None None None None   Compliance with Home Oxygen Use Yes Yes Yes -- Yes     Goals/Expected Outcomes   Short Term Goals To learn and exhibit compliance with exercise, home and travel O2 prescription;To learn and understand importance of monitoring SPO2 with pulse oximeter and demonstrate accurate use of the pulse oximeter.;To learn and understand importance of maintaining oxygen saturations>88%;To learn and demonstrate proper pursed lip breathing techniques or other breathing techniques.;To learn and demonstrate proper use of respiratory medications To learn and exhibit compliance with exercise, home and travel O2 prescription;To learn and understand  importance of monitoring SPO2 with pulse oximeter and demonstrate accurate use of the pulse oximeter.;To learn and understand importance of maintaining oxygen saturations>88%;To learn and demonstrate proper pursed lip breathing techniques or other breathing techniques.;To learn and demonstrate proper use of respiratory medications To learn and exhibit compliance with exercise, home and travel O2 prescription;To learn and understand importance of monitoring SPO2 with pulse oximeter and demonstrate accurate use of the pulse oximeter.;To learn and understand importance of maintaining oxygen saturations>88%;To learn and demonstrate proper pursed lip breathing techniques or other breathing techniques.;To learn and demonstrate proper use of respiratory medications To learn and demonstrate proper use of respiratory medications To learn and demonstrate proper pursed lip breathing techniques or other breathing techniques.   Long  Term Goals Exhibits compliance with exercise, home and travel O2 prescription;Verbalizes importance of monitoring SPO2 with pulse oximeter and return demonstration;Maintenance of O2 saturations>88%;Exhibits proper breathing techniques, such as pursed lip breathing or other method taught during program session;Compliance with respiratory medication;Demonstrates proper use of MDI's Exhibits compliance with exercise, home and travel O2 prescription;Verbalizes importance of monitoring SPO2 with pulse oximeter and return demonstration;Maintenance of O2 saturations>88%;Exhibits proper breathing techniques, such as pursed lip breathing or other method taught during program session;Compliance with respiratory medication;Demonstrates proper use of MDI's Exhibits compliance with exercise, home and travel O2 prescription;Verbalizes importance of monitoring SPO2 with pulse oximeter and return demonstration;Maintenance of O2 saturations>88%;Exhibits proper breathing techniques, such as pursed lip  breathing or other method taught during program session;Compliance with respiratory medication;Demonstrates proper use of MDI's Demonstrates proper use of MDI's;Compliance with respiratory medication Exhibits proper breathing techniques, such as pursed lip breathing or other method taught during program session   Comments Hannah Green is using her PLB and finds it helpful. She does not have a pulse oximeter so we talked about getting one.  She will look into it. Hannah Green plans to add a day of exercise in addition to LW class.  Staff reviewed importance of checking O2 during exercise. Staff reviewed importance of checking O2 during exercise, encouraged to take back out her pulse ox. Practices PLB and reports getting better with it. Hannah Green has no questions about her respiratory medications and how to use them. She is taking her medications as prescribed. Diaphragmatic and PLB breathing explained and performed with patient. Patient has a better understanding of how to do these exercises to help with breathing performance and relaxation. Patient performed breathing techniques adequately and to practice further at home.   Goals/Expected Outcomes Short: Get pulse oximeter.  Long: Continue to work on PLB. Short: monitor O2 when exercising at home Long: use PLB as needed Short: monitor O2 when exercising at home Long: use PLB as needed Short: continue with medication regimine and ask any questions she may have about them. Long: maintain respiratory medications independently Short: practice PLB and diaphragmatic breathing  at home. Long: Use PLB and diaphragmatic breathing independently post [employer].          Oxygen Discharge (Final Oxygen Re-Evaluation):  Oxygen Re-Evaluation - 01/09/20 1115      Program Oxygen Prescription   Program Oxygen Prescription None      Home Oxygen   Home Oxygen Device None    Sleep Oxygen Prescription None    Home Exercise Oxygen Prescription None    Home Resting Oxygen Prescription None     Compliance with Home Oxygen Use Yes      Goals/Expected Outcomes   Short Term Goals To learn and demonstrate proper pursed lip breathing techniques or other breathing techniques.    Long  Term Goals Exhibits proper breathing techniques, such as pursed lip breathing or other method taught during program session    Comments Diaphragmatic and PLB breathing explained and performed with patient. Patient has a better understanding of how to do these exercises to help with breathing performance and relaxation. Patient performed breathing techniques adequately and to practice further at home.    Goals/Expected Outcomes Short: practice PLB and diaphragmatic breathing at home. Long: Use PLB and diaphragmatic breathing independently post [employer].           Initial Exercise Prescription:   Perform Capillary Blood Glucose checks as needed.  Exercise Prescription Changes:  Exercise Prescription Changes    Row Name 09/10/19 1300 09/24/19 1300 10/08/19 1300 10/22/19 1300 11/05/19 1300     Response to Exercise   Blood Pressure (Admit) 146/70 114/66 130/60 128/64 122/66   Blood Pressure (Exercise) 148/68 142/68 164/72 150/62 146/50   Blood Pressure (Exit) 122/60 110/60 122/54 112/64 136/56   Heart Rate (Admit) 69 bpm 65 bpm 73 bpm 74 bpm 72 bpm   Heart Rate (Exercise) 97 bpm 87 bpm 103 bpm 106 bpm 107 bpm   Heart Rate (Exit) 80 bpm 79 bpm 77 bpm 80 bpm 84 bpm   Oxygen Saturation (Admit) 96 % 96 % 96 % 96 % 96 %   Oxygen Saturation (Exercise) 94 % 94 % 94 % 94 % 95 %   Oxygen Saturation (Exit) 98 % 93 % 96 % 97 % 98 %   Rating of Perceived Exertion (Exercise) '13 12 12 13 13   ' Perceived Dyspnea (Exercise) '2 2 2 3 1   ' Symptoms none none none none none   Duration Continue with 30 min of aerobic exercise without signs/symptoms of physical distress. Continue with 30 min of aerobic exercise without signs/symptoms of physical distress. Continue with 30 min of aerobic exercise without signs/symptoms of  physical distress. Continue with 30 min of aerobic exercise without signs/symptoms of physical distress. Continue with 30 min of aerobic exercise without signs/symptoms of physical distress.   Intensity THRR unchanged THRR unchanged THRR unchanged THRR unchanged THRR unchanged     Progression   Progression Continue to progress workloads to maintain intensity without signs/symptoms of physical distress. Continue to progress workloads to maintain intensity without signs/symptoms of physical distress. Continue to progress workloads to maintain intensity without signs/symptoms of physical distress. Continue to progress workloads to maintain intensity without signs/symptoms of physical distress. Continue to progress workloads to maintain intensity without signs/symptoms of physical distress.   Average METs 2.5 2.75 2.5 2.96 2.4     Resistance Training   Training Prescription Yes Yes Yes Yes Yes   Weight 3 lb 3 lb 3 lb 3 lb 3 lb   Reps 10-15 10-15 10-15 10-15 10-15     Interval  Training   Interval Training No No No No No     Treadmill   MPH 1.9 1.9 1.8 1.9 2   Grade 0.5 0.5 0.5 0.5 0.5   Minutes '15 15 15 15 15   ' METs 2.6 2.59 2.59 2.59 2.67     NuStep   Level '4 4 3 4 4   ' SPM 80 -- 80 -- 80   Minutes '15 15 15 15 15   ' METs 2.4 2.9 2.4 3.3 2.1     REL-XR   Level -- 1 -- -- --   Minutes -- 15 -- -- --     Biostep-RELP   Level -- -- -- 1 --   Minutes -- -- -- 15 --   METs -- -- -- 3 --     Home Exercise Plan   Plans to continue exercise at -- Home (comment)  walking, also considering joining [employer] (comment)  walking, also considering joining [employer] (comment)  walking, also considering joining [employer] --   Frequency -- Add 2 additional days to program exercise sessions. Add 2 additional days to program exercise sessions. Add 2 additional days to program exercise sessions. --   Initial Home Exercises Provided -- 09/24/19 09/24/19 09/24/19 --   [place] Name 11/19/19 1400  12/02/19 1000 12/30/19 1100 01/13/20 1100       Response to Exercise   Blood Pressure (Admit) 126/62 132/60 120/58 136/78    Blood Pressure (Exercise) 154/60 160/80 172/62 128/64    Blood Pressure (Exit) 122/70 106/60 138/60 124/76    Heart Rate (Admit) 70 bpm 73 bpm 65 bpm 66 bpm    Heart Rate (Exercise) 89 bpm 108 bpm 107 bpm 105 bpm    Heart Rate (Exit) 80 bpm 78 bpm 86 bpm 76 bpm    Oxygen Saturation (Admit) 94 % 98 % 95 % 95 %    Oxygen Saturation (Exercise) 96 % 96 % 95 % 93 %    Oxygen Saturation (Exit) 97 % 95 % 90 % 91 %    Rating of Perceived Exertion (Exercise) '13 13 13 13    ' Perceived Dyspnea (Exercise) '3 3 2 2    ' Symptoms none -- none none    Duration Continue with 30 min of aerobic exercise without signs/symptoms of physical distress. Continue with 30 min of aerobic exercise without signs/symptoms of physical distress. Continue with 30 min of aerobic exercise without signs/symptoms of physical distress. Continue with 30 min of aerobic exercise without signs/symptoms of physical distress.    Intensity THRR unchanged THRR unchanged THRR unchanged THRR unchanged         Progression   Progression Continue to progress workloads to maintain intensity without signs/symptoms of physical distress. Continue to progress workloads to maintain intensity without signs/symptoms of physical distress. Continue to progress workloads to maintain intensity without signs/symptoms of physical distress. Continue to progress workloads to maintain intensity without signs/symptoms of physical distress.    Average METs 2.89 3.2 2.8 2.83         Resistance Training   Training Prescription Yes Yes Yes Yes    Weight 3 lb 3 lb 3 lb 3 lb    Reps 10-15 10-15 10-15 10-15         Interval Training   Interval Training No No No No         Treadmill   MPH '2 2 2 ' 1.8    Grade 0.5 0.5 0.5 0.5    Minutes 15  '15 15 15    ' METs 2.67 2.67 2.67 2.5         NuStep   Level '4 4 2 4    ' SPM -- 80 80 --     Minutes '15 15 15 15    ' METs 3 3.'7 3 3         ' Biostep-RELP   Level 1 -- -- 2    Minutes 15 -- -- 15    METs 3 -- -- 3         Home Exercise Plan   Plans to continue exercise at Home (comment)  walking, also considering joining [employer] -- Home (comment)  walking, also considering joining [employer] (comment)  walking, also considering joining [employer]    Frequency Add 2 additional days to program exercise sessions. -- Add 2 additional days to program exercise sessions. Add 2 additional days to program exercise sessions.    Initial Home Exercises Provided 09/24/19 -- 09/24/19 09/24/19           Exercise Comments:   Exercise Goals and Review:   Exercise Goals Re-Evaluation :  Exercise Goals Re-Evaluation    Row Name 09/03/19 1142 09/10/19 1305 09/24/19 1145 09/24/19 1335 10/08/19 1340     Exercise Goal Re-Evaluation   Exercise Goals Review Increase Physical Activity;Understanding of Exercise Prescription;Increase Strength and Stamina Increase Physical Activity;Understanding of Exercise Prescription;Increase Strength and Stamina Increase Physical Activity;Understanding of Exercise Prescription;Increase Strength and Stamina Increase Physical Activity;Understanding of Exercise Prescription;Increase Strength and Stamina Increase Physical Activity;Understanding of Exercise Prescription;Increase Strength and Stamina   Comments Hannah Green is doing well in rehab.  She is not a fan on the XR. We will look at moving her around.  She is planning to walk and swim while out on vacation next week. Hannah Green has increased her speed on TM.  Staff will follow up with her when she returns from vacation. Reviewed home exercise with pt today.  Pt plans to walk/staff videos for exercise.  Reviewed THR, pulse, RPE, sign and symptoms, pulse oximetery and when to call 911 or MD.  Also discussed weather considerations and indoor options.  Pt voiced understanding. Hannah Green is doing well in rehab.  She is now up to  level 4 onte NuStep.  We will continue to monitor her progress. Hannah Green works at [employer] and reaches THR range during some of her sessions.  Staff will encourage increasing workloads slightly to maintain THR during session.   Expected Outcomes Short: Walk and swimmon vacation Long; Continue to improve stamina. Short: stay active on vacation Long: increase MET level Short: add one day of exercise in addition to program sessions Long:  increase stamina Short: Start to slowly bring up workloads  Long: Continue to improve stamina. Short:  increase workloads Long: improve MET level   Row Name 10/22/19 1349 11/05/19 1121 11/19/19 1452 11/21/19 1127 12/02/19 1032     Exercise Goal Re-Evaluation   Exercise Goals Review Increase Physical Activity;Understanding of Exercise Prescription;Increase Strength and Stamina Increase Physical Activity;Understanding of Exercise Prescription;Increase Strength and Stamina Increase Physical Activity;Understanding of Exercise Prescription;Increase Strength and Stamina Increase Physical Activity;Understanding of Exercise Prescription;Increase Strength and Stamina Increase Physical Activity;Understanding of Exercise Prescription;Increase Strength and Stamina   Comments Hannah Green has been doing well in rehab. She is now up to level 4 on the NuSep.  She is still on level 1 for BioStep, so we will encourage her to increase that workload.  We will continue to monitor her progress. Treadmill -  20 minutes 1x/week - 101mh. Discussed adding in weighted exercises and 150 minutes of cardio exercise. SGracieanncontinues to have problems attending rehab consistently.  She was out all last week and called out again today.  Progression would be improved with better attendance. SVelinacontinues to have problems attending rehab consistently. Progression would be improved with better attendance. SBreiis exercising at home - treadmill and resistance learned during rehab - walks on the treadmill 20-30 minutes  2x/week, resistance every day. SOneikahas increased MET level.  Staff will review correct THR/oxygen range for her exercise at home.   Expected Outcomes Short: Increase workload on BioStep. Long: Continue to improve stamina. Short: Increase home exercise Long: Continue to improve stamina. Short: Improved attendance for rehab  Long: Continue to increase activity levels Short: Improve attendance for rehab, continue to exercise at home, consistently use treadmill for 30 minutes Long: Continue to increase activity levels Short: continue exercise at home, attend consistently Long: increase overall stamina   Row Name 12/16/19 1545 12/24/19 1141 12/30/19 1132 01/09/20 1131 01/13/20 1119     Exercise Goal Re-Evaluation   Exercise Goals Review -- Increase Physical Activity;Increase Strength and Stamina Increase Physical Activity;Increase Strength and Stamina Increase Physical Activity;Increase Strength and Stamina Increase Physical Activity;Increase Strength and Stamina;Understanding of Exercise Prescription   Comments Out since last review.  Last attended on 11/26/19.  Average attendance is around once a week with 20 visits in 18 weeks. She was out of Rehab due to three friends passing away. SAlayziahis now ready to get back on track with exercise. SSativahas been inconsistent with attendance.  She plans to get back consistently. Hannah Green stretches at home but states she is not doing enough to keep her heart rate up. She has a treadmill that she can use at home. She is going to try to use her treadmill more when the holidays are over. SMerrilyncame in early today to make up for an appointment that would take her out this week.  She is up to 2.6 METs on the NuStep.  We will continue to encourage improved attendance to continue to see progression.   Expected Outcomes Short: Return to consistent attendance  Long: Exercise at home Short: resume regular [place]: [place] Short: attend LW consistently  Long: improve stamina Short: get back into working out at home. Long: add more days to working out at home Short: Continue to attend consistently  Long: Continue to improve stamina   Row Name 02/11/20 0853             Exercise Goal Re-Evaluation   Comments Out since last review              Discharge Exercise Prescription (Final Exercise Prescription Changes):  Exercise Prescription Changes - 01/13/20 1100      Response to Exercise   Blood Pressure (Admit) 136/78    Blood Pressure (Exercise) 128/64    Blood Pressure (Exit) 124/76    Heart Rate (Admit) 66 bpm    Heart Rate (Exercise) 105 bpm    Heart Rate (Exit) 76 bpm    Oxygen Saturation (Admit) 95 %    Oxygen Saturation (Exercise) 93 %    Oxygen Saturation (Exit) 91 %    Rating of Perceived Exertion (Exercise) 13    Perceived Dyspnea (Exercise) 2    Symptoms none    Duration Continue with 30 min of aerobic exercise without signs/symptoms of physical distress.    Intensity THRR unchanged  Progression   Progression Continue to progress workloads to maintain intensity without signs/symptoms of physical distress.    Average METs 2.83      Resistance Training   Training Prescription Yes    Weight 3 lb    Reps 10-15      Interval Training   Interval Training No      Treadmill   MPH 1.8    Grade 0.5    Minutes 15    METs 2.5      NuStep   Level 4    Minutes 15    METs 3      Biostep-RELP   Level 2    Minutes 15    METs 3      Home Exercise Plan   Plans to continue exercise at Home (comment)   walking, also considering joining [employer]   Frequency Add 2 additional days to program exercise sessions.    Initial Home Exercises Provided 09/24/19           Nutrition:  Target Goals: Understanding of nutrition guidelines, daily intake of sodium <1538m, cholesterol <2070m calories 30% from fat and 7% or less from saturated fats, daily to have 5 or more servings of fruits and vegetables.  Education:  All About Nutrition: -Group instruction provided by verbal, written material, interactive activities, discussions, models, and posters to present general guidelines for heart healthy nutrition including fat, fiber, MyPlate, the role of sodium in heart healthy nutrition, utilization of the nutrition label, and utilization of this knowledge for meal planning. Follow up email sent as well. Written material given at graduation. [facility]  Date 01/09/20  [facility] Code 1- Verbalizes Understanding      Biometrics:    Nutrition Therapy Plan and Nutrition Goals:  Nutrition Therapy & Goals - 10/03/19 1130      Nutrition Therapy   Diet Pulmonary MNT, heart healthy eating    Drug/Food Interactions Statins/Certain Fruits    Protein (specify units) 80g    Fiber 25 grams    Whole Grain Foods 3 servings    Saturated Fats 12 max. grams    Fruits and Vegetables 5 servings/day    Sodium 1.5 grams      Personal Nutrition Goals   Nutrition Goal ST: add granola to ice cream, eat sweet potatos as a snack, add protein source to snack or breakfast LT: improve breathing (now 2/10)    Comments B: tries to eat when she gets up. An egg (soft boiled) toast or or english muffin (whole wheat) - butter. Coffee or hot tea (sugar free french vanilla creamer). S: L: tuna (mustard and green olives) with crackers (wheat thins (reduced fat)). Diet green tea. D: salmon, no red meat, some portk, mostly chicken or fish (baked). asparagus, lots of salad with other nonstarchy vegetables, baked vegetables (olive oil) S: loves to bake, ice cream Drinks: diet green tea and water Discussed heart healthy eaitng, pulmonary MNT, discussed adding one more good source of protein. Pt reports loving sweet potatos and would like to have granola on her ice cream (will find a healthy version)      Intervention Plan   Intervention Prescribe, educate  and counsel regarding individualized specific dietary modifications aiming towards targeted core components such as weight, hypertension, lipid management, diabetes, heart failure and other comorbidities.;Nutrition handout(s) given to patient.    Expected Outcomes Short Term Goal: Understand basic principles of dietary content,  such as calories, fat, sodium, cholesterol and nutrients.;Short Term Goal: A plan has been developed with personal nutrition goals set during dietitian appointment.;Long Term Goal: Adherence to prescribed nutrition plan.           Nutrition Assessments:  MEDIFICTS Score Key:  ?70 Need to make dietary changes   40-70 Heart Healthy Diet  ? 40 Therapeutic Level Cholesterol Diet   Picture Your Plate Scores:  <42 Unhealthy dietary pattern with much room for improvement.  41-50 Dietary pattern unlikely to meet recommendations for good health and room for improvement.  51-60 More healthful dietary pattern, with some room for improvement.   >60 Healthy dietary pattern, although there may be some specific behaviors that could be improved.   Nutrition Goals Re-Evaluation:  Nutrition Goals Re-Evaluation    [place] Name 09/03/19 1157 11/05/19 1135 11/21/19 1115 12/24/19 1143 01/09/20 1127     Goals   Current Weight -- -- -- 226 lb (102.5 kg) 233 lb (105.7 kg)   Nutrition Goal Meet with dietician ST: continue current changes LT: improve breathing (now 3/10) ST: continue current changes, reduce sugar LT: improve breathing (now 3/10) Lose weight and watch sugar intake. Eat less sugar and lose weight   Comment Hannah Green has already to started to make some healthier changes in her diet and seeing the difference in her weight and how she is feeling. She tries new recipes and has a heart healthy diet. continue current chnages. Hannah Green reports doing well with her nutrition and is still trying to cut back on sugar, discussed restirciton and cravings - she still wants to quit cold [place].  Reports still eating well and would like to continue with her current changes. She finds it hard not to eat ice cream. She eats ice cream about twice a week from every night. She does not want to be a diabetic. Shai is going to try to eat less sugar and try to cut down on her portion sizes.   Expected Outcome Short: Meet with dietcian Long: Continue to work on healthy changes. ST: continue current changes LT: improve breathing (now 3/10) ST: continue current changes, reduce sugar LT: improve breathing (now 4/10) Short: cut out more sugar in her diet. Long: maintain diet independently that works for her. Short: reduce sugar intake. Long:lose 5 pounds in three weeks.          Nutrition Goals Discharge (Final Nutrition Goals Re-Evaluation):  Nutrition Goals Re-Evaluation - 01/09/20 1127      Goals   Current Weight 233 lb (105.7 kg)    Nutrition Goal Eat less sugar and lose weight    Comment Hannah Green is going to try to eat less sugar and try to cut down on her portion sizes.    Expected Outcome Short: reduce sugar intake. Long:lose 5 pounds in three weeks.           Psychosocial: Target Goals: Acknowledge presence or absence of significant depression and/or stress, maximize coping skills, provide positive support system. Participant is able to verbalize types and ability to use techniques and skills needed for reducing stress and depression.   Education: Stress, Anxiety, and Depression - Group verbal and visual presentation to define topics covered.  Reviews how body is impacted by stress, anxiety, and depression.  Also discusses healthy ways to reduce stress and to treat/manage anxiety and depression.  Written material given at graduation.   Education: Sleep Hygiene -Provides group verbal and written instruction about how sleep can affect your health.  Define sleep  hygiene, discuss sleep cycles and impact of sleep habits. Review good sleep hygiene tips.  Flowsheet Row Pulmonary Rehab from  01/15/2020 in [facility] Cardiac and Pulmonary Rehab  Date 09/05/19  Educator [facility]  Instruction Review Code 1- Verbalizes Understanding      Initial Review & Psychosocial Screening:   Quality of Life Scores:  Scores of 19 and below usually indicate a poorer quality of life in these areas.  A difference of  2-3 points is a clinically meaningful difference.  A difference of 2-3 points in the total score of the Quality of Life Index has been associated with significant improvement in overall quality of life, self-image, physical symptoms, and general health in studies assessing change in quality of life.  PHQ-9: Recent Review Flowsheet Data    Depression screen [facility] 2/9 02/14/2020 01/09/2020 08/13/2019 12/11/2018 12/05/2017   Decreased Interest 0 0 0 0 0   Down, Depressed, Hopeless 0 0 0 0 1   PHQ - 2 Score 0 0 0 0 1   Altered sleeping 0 3 1 0 2   Tired, decreased energy 0 3 2 0 1   Change in appetite 0 2 2 0 2   Feeling bad or failure about yourself  0 0 0 0 1   Trouble concentrating 0 0 0 0 0   Moving slowly or fidgety/restless 0 0 0 0 0   Suicidal thoughts 0 0 0 0 0   PHQ-9 Score 0 8 5 0 7   Difficult doing work/chores Not difficult at all Somewhat difficult Somewhat difficult Not difficult at all Not difficult at all     Interpretation of Total Score  Total Score Depression Severity:  1-4 = Minimal depression, 5-9 = Mild depression, 10-14 = Moderate depression, 15-19 = Moderately severe depression, 20-27 = Severe depression   Psychosocial Evaluation and Intervention:   Psychosocial Re-Evaluation:  Psychosocial Re-Evaluation    Row Name 09/03/19 1146 09/24/19 1138 11/05/19 1125 11/21/19 1122 12/24/19 1138     Psychosocial Re-Evaluation   Current issues with Current Stress Concerns;Current Sleep Concerns -- Current Stress Concerns;Current Sleep Concerns Current Sleep Concerns Current Sleep Concerns;Current Stress Concerns   Comments Hannah Green is doing well in rehab.  She is going to the  beach for the week next week to [facility]. She is looking forward to her trip to just get away for a few days.  She continues to have trouble sleeping, so I sent her information to review. Zelene still doesnt sleep well.  She has trouble going back to sleep if she gets up.  Other than sleep, she feels good mentally. Her family gives her stress and her plumbing has been backed up which is causing her stress. Hannah Green is still has trouble sleeping and thinks she may need a new mattress in addition to stress. Discussed sleep hygiene. Hannah Green rpeorts her stress is better, but reports her sleep is poor. She reports having to wake up to go to the bathroom. Sultana states that her sleep is better. She is the kind of person that once she wakes up she is awake for the day. She is going to bed earlier to help her sleep through the night.   Expected Outcomes Short: Review sleep info and enjoy vacation Long; Continue to stay positive. Short: continue to work on sleeping better Long: maintain positive outlook Short: continue to work on sleeping better Long: maintain positive outlook Short: continue to work on sleeping better, ask PCP about pelvic floor therapy to help with  frequent urination Long: maintain positive outlook Short: continue going to bed early for adequate sleep. Long: maintain good sleeping habits to keep stress at a minimum.   Interventions Encouraged to attend Pulmonary Rehabilitation for the exercise;Stress management education -- Encouraged to attend Pulmonary Rehabilitation for the exercise;Stress management education Encouraged to attend Pulmonary Rehabilitation for the exercise Encouraged to attend Pulmonary Rehabilitation for the exercise   Continue Psychosocial Services  Follow up required by staff -- Follow up required by staff Follow up required by staff Follow up required by staff   Comments -- -- working on stress and sleep management sleep management --     Initial Review   Source of Stress  Concerns -- -- Chronic Illness;Unable to participate in former interests or hobbies Chronic Illness;Unable to participate in former interests or hobbies --   [place] Name 01/09/20 1121             Psychosocial Re-Evaluation   Current issues with Current Stress Concerns;Current Sleep Concerns       Comments Reviewed patient health questionnaire (PHQ-9) with patient for follow up. Previously, patients score indicated signs/symptoms of depression.  Reviewed to see if patient is improving symptom wise while in program.  Score declined and patient states that it is because she has not been sleeping well and has been eating too much. She is going to start eating less and not so late.       Expected Outcomes Short: Continue to work toward an improvement in [place] scores by attending [facility] regularly. Long: Continue to improve stress and depression coping skills by talking with staff and attending [facility] regularly and work toward a positive mental state.       Interventions Encouraged to attend Pulmonary Rehabilitation for the exercise       Continue Psychosocial Services  Follow up required by staff              Psychosocial Discharge (Final Psychosocial Re-Evaluation):  Psychosocial Re-Evaluation - 01/09/20 1121      Psychosocial Re-Evaluation   Current issues with Current Stress Concerns;Current Sleep Concerns    Comments Reviewed patient health questionnaire (PHQ-9) with patient for follow up. Previously, patients score indicated signs/symptoms of depression.  Reviewed to see if patient is improving symptom wise while in program.  Score declined and patient states that it is because she has not been sleeping well and has been eating too much. She is going to start eating less and not so late.    Expected Outcomes Short: Continue to work toward an improvement in [place] scores by attending [facility] regularly. Long: Continue to improve stress and depression coping skills by  talking with staff and attending [facility] regularly and work toward a positive mental state.    Interventions Encouraged to attend Pulmonary Rehabilitation for the exercise    Continue Psychosocial Services  Follow up required by staff           Education: Education Goals: Education classes will be provided on a weekly basis, covering required topics. Participant will state understanding/return demonstration of topics presented.  Learning Barriers/Preferences:   General Pulmonary Education Topics:  Infection Prevention: - Provides verbal and written material to individual with discussion of infection control including proper hand washing and proper equipment cleaning during exercise session. Flowsheet Row Pulmonary Rehab from 01/15/2020 in [facility] Cardiac and Pulmonary Rehab  Date 08/13/19  Educator [facility]  Instruction Review Code 1- Verbalizes Understanding      Falls Prevention: - Provides verbal and written material  to individual with discussion of falls prevention and safety. Flowsheet Row Pulmonary Rehab from 01/15/2020 in [facility] Cardiac and Pulmonary Rehab  Date 08/13/19  Educator [facility]  Instruction Review Code 1- Verbalizes Understanding      Chronic Lung Disease Review: - Group verbal instruction with posters, models, PowerPoint presentations and videos,  to review new updates, new respiratory medications, new advancements in procedures and treatments. Providing information on websites and "800" numbers for continued self-education. Includes information about supplement oxygen, available portable oxygen systems, continuous and intermittent flow rates, oxygen safety, concentrators, and Medicare reimbursement for oxygen. Explanation of Pulmonary Drugs, including class, frequency, complications, importance of spacers, rinsing mouth after steroid MDI's, and proper cleaning methods for nebulizers. Review of basic lung anatomy and physiology related to function, structure, and complications  of lung disease. Review of risk factors. Discussion about methods for diagnosing sleep apnea and types of masks and machines for OSA. Includes a review of the use of types of environmental controls: home humidity, furnaces, filters, dust mite/pet prevention, HEPA vacuums. Discussion about weather changes, air quality and the benefits of nasal washing. Instruction on Warning signs, infection symptoms, calling MD promptly, preventive modes, and value of vaccinations. Review of effective airway clearance, coughing and/or vibration techniques. Emphasizing that all should Create an Action Plan. Written material given at graduation. Flowsheet Row Pulmonary Rehab from 01/15/2020 in [facility] Cardiac and Pulmonary Rehab  Date 09/19/19  Educator jh  Instruction Review Code 1- Verbalizes Understanding      AED/CPR: - Group verbal and written instruction with the use of models to demonstrate the basic use of the AED with the basic ABC's of resuscitation.    Anatomy and Cardiac Procedures: - Group verbal and visual presentation and models provide information about basic cardiac anatomy and function. Reviews the testing methods done to diagnose heart disease and the outcomes of the test results. Describes the treatment choices: Medical Management, Angioplasty, or Coronary Bypass Surgery for treating various heart conditions including Myocardial Infarction, Angina, Valve Disease, and Cardiac Arrhythmias.  Written material given at graduation. Flowsheet Row Pulmonary Rehab from 01/15/2020 in [facility] Cardiac and Pulmonary Rehab  Date 12/26/19  Educator SB  Instruction Review Code 1- Verbalizes Understanding      Medication Safety: - Group verbal and visual instruction to review commonly prescribed medications for heart and lung disease. Reviews the medication, class of the drug, and side effects. Includes the steps to properly store meds and maintain the prescription regimen.  Written material given at  graduation. Flowsheet Row Pulmonary Rehab from 01/15/2020 in [facility] Cardiac and Pulmonary Rehab  Date 01/15/20  Educator SB  Instruction Review Code 1- Verbalizes Understanding      Other: -Provides group and verbal instruction on various topics (see comments)   Knowledge Questionnaire Score:    Core Components/Risk Factors/Patient Goals at Admission:   Education:Diabetes - Individual verbal and written instruction to review signs/symptoms of diabetes, desired ranges of glucose level fasting, after meals and with exercise. Acknowledge that pre and post exercise glucose checks will be done for 3 sessions at entry of program.   Know Your Numbers and Heart Failure: - Group verbal and visual instruction to discuss disease risk factors for cardiac and pulmonary disease and treatment options.  Reviews associated critical values for Overweight/Obesity, Hypertension, Cholesterol, and Diabetes.  Discusses basics of heart failure: signs/symptoms and treatments.  Introduces Heart Failure Zone chart for action plan for heart failure.  Written material given at graduation.   Core Components/Risk Factors/Patient Goals Review:  Goals and Risk Factor Review    Row Name 09/03/19 1144 09/24/19 1135 11/05/19 1134 11/21/19 1126 12/24/19 1136     Core Components/Risk Factors/Patient Goals Review   Personal Goals Review Weight Management/Obesity;Improve shortness of breath with ADL's;Hypertension Weight Management/Obesity;Improve shortness of breath with ADL's;Hypertension Weight Management/Obesity;Improve shortness of breath with ADL's;Hypertension Weight Management/Obesity;Improve shortness of breath with ADL's;Hypertension Improve shortness of breath with ADL's   Review Hannah Green is doing well in rehab.  Her weight is trending down.  She just doesn't like our scale compared to hers at home. Her blood pressures are good in class and she does not check them at home.  Her doctor wants her to check it.  Her  breathing is doing well. Hannah Green just got back from vacation so her weight may be up.  She doesnt monitor BP at home.  She has a time scheduled to meet with RD about diet. She doesnt monitor BP at home, she has a cuff at home and will start monitoring it. She doesnt monitor BP at home, she has a cuff at home and will start monitoring it - encouraged checking it 1x/day not at rehab. Improving SOB and practicing PLB. Sometimes it is challenging for her to do her daily activities at home. She has more trouble with bending over and picking things up.Spoke to patient about their shortness of breath and what they can do to improve. Patient has been informed of breathing techniques when starting the program. Patient is informed to tell staff if they have had any med changes and that certain meds they are taking or not taking can be causing shortness of breath.   Expected Outcomes Short: Start checking blood pressures. Long: Continue to monitor risk factors. Short: neet with RD Long: manage risk factors Short: PLB anf monitor BP Long: manage risk factors Short: PLB and monitor BP Long: manage risk factors Short: Attend [facility] regularly to improve shortness of breath with ADL's. Long: maintain independence with ADL's   Row Name 01/09/20 1117             Core Components/Risk Factors/Patient Goals Review   Personal Goals Review Improve shortness of breath with ADL's       Review Spoke to patient about their shortness of breath and what they can do to improve. Patient has been informed of breathing techniques when starting the program. Patient is informed to tell staff if they have had any med changes and that certain meds they are taking or not taking can be causing shortness of breath.       Expected Outcomes Short: Attend [facility] regularly to improve shortness of breath with ADL's. Long: maintain independence with ADL's              Core Components/Risk Factors/Patient Goals at Discharge (Final Review):    Goals and Risk Factor Review - 01/09/20 1117      Core Components/Risk Factors/Patient Goals Review   Personal Goals Review Improve shortness of breath with ADL's    Review Spoke to patient about their shortness of breath and what they can do to improve. Patient has been informed of breathing techniques when starting the program. Patient is informed to tell staff if they have had any med changes and that certain meds they are taking or not taking can be causing shortness of breath.    Expected Outcomes Short: Attend [facility] regularly to improve shortness of breath with ADL's. Long: maintain independence with ADL's  ITP Comments:  ITP Comments    Row Name 09/11/19 1050 10/09/19 0629 11/06/19 1615 11/19/19 1451 12/04/19 0603   ITP Comments 30 Day review completed. Medical Director ITP review done, changes made as directed, and signed approval by Medical Director. 30 Day review completed. Medical Director ITP review done, changes made as directed, and signed approval by Medical Director. 30 day review completed. ITP sent to Dr. Emily Filbert, Medical Director of Cardiac and Pulmonary Rehab. Continue with ITP unless changes are made by physician. Called out today with MD appt 30 Day review completed. Medical Director ITP review done, changes made as directed, and signed approval by Medical Director.   [place] Name 12/16/19 1544 12/18/19 1507 01/01/20 0708 01/29/20 1027 01/30/20 1115   ITP Comments Pt continues to call out for various reasons.  Last attended on 11/26/2019.  She has only completed 20 visits since 08/13/19 (averages around once a week). Called to check on patient.  She stated that she has had a lot going on recently.  She has only attended 20 times in 18 weeks.  She was encouraged to improve her attendance to get full benefit of program.  She intends to be here tomorrow. 30 Day review completed. Medical Director ITP review done, changes made as directed, and signed approval by Medical  Director. 30 Day review completed. Medical Director ITP review done, changes made as directed, and signed approval by Medical Director. Jameila has not attended since last review.   [place] Name 02/11/20 5461 02/25/20 1414 02/26/20 0615 02/27/20 1413     ITP Comments Been out sick with sinus infection/urinary tract infection Miyah has not attended since last review. 30 Day review completed. Medical Director ITP review done, changes made as directed, and signed approval by Medical Director.  No visits in Dec Brynda is chosing to discharge from the program early. She doesn't feel like she has fully recovered from [place] and wishes to be done with Pulmonary Rehab           Comments: Discharge ITP

## 2020-02-27 NOTE — Telephone Encounter (Signed)
Called [place] to check in as she has been out since November. She had been sick with a sinus infection and had a UTI. Hannah Green is getting close to graduating, so I offered for her to graduate early if she wanted to. Left message.

## 2020-03-08 DIAGNOSIS — Z1212 Encounter for screening for malignant neoplasm of rectum: Secondary | ICD-10-CM | POA: Diagnosis not present

## 2020-03-08 DIAGNOSIS — Z1211 Encounter for screening for malignant neoplasm of colon: Secondary | ICD-10-CM | POA: Diagnosis not present

## 2020-03-08 LAB — COLOGUARD: Cologuard: NEGATIVE

## 2020-03-20 ENCOUNTER — Encounter: Payer: Self-pay | Admitting: Family Medicine

## 2020-03-23 ENCOUNTER — Ambulatory Visit: Payer: Medicare HMO | Admitting: Cardiology

## 2020-04-13 ENCOUNTER — Ambulatory Visit: Payer: Medicare HMO | Admitting: Cardiology

## 2020-04-20 NOTE — Assessment & Plan Note (Signed)
Encouraged exercise, weight loss, healthy eating habits. ? ?

## 2020-04-20 NOTE — Assessment & Plan Note (Signed)
Referral to dermatology for eval and treat.

## 2020-05-04 ENCOUNTER — Ambulatory Visit: Payer: Medicare HMO | Admitting: Cardiology

## 2020-05-07 ENCOUNTER — Other Ambulatory Visit: Payer: Self-pay | Admitting: Acute Care

## 2020-05-22 DIAGNOSIS — R030 Elevated blood-pressure reading, without diagnosis of hypertension: Secondary | ICD-10-CM | POA: Diagnosis not present

## 2020-05-22 DIAGNOSIS — D8481 Immunodeficiency due to conditions classified elsewhere: Secondary | ICD-10-CM | POA: Diagnosis not present

## 2020-05-22 DIAGNOSIS — E785 Hyperlipidemia, unspecified: Secondary | ICD-10-CM | POA: Diagnosis not present

## 2020-05-22 DIAGNOSIS — Z6836 Body mass index (BMI) 36.0-36.9, adult: Secondary | ICD-10-CM | POA: Diagnosis not present

## 2020-05-22 DIAGNOSIS — J449 Chronic obstructive pulmonary disease, unspecified: Secondary | ICD-10-CM | POA: Diagnosis not present

## 2020-05-22 DIAGNOSIS — K5900 Constipation, unspecified: Secondary | ICD-10-CM | POA: Diagnosis not present

## 2020-05-22 DIAGNOSIS — M069 Rheumatoid arthritis, unspecified: Secondary | ICD-10-CM | POA: Diagnosis not present

## 2020-05-22 DIAGNOSIS — I2510 Atherosclerotic heart disease of native coronary artery without angina pectoris: Secondary | ICD-10-CM | POA: Diagnosis not present

## 2020-05-22 DIAGNOSIS — K219 Gastro-esophageal reflux disease without esophagitis: Secondary | ICD-10-CM | POA: Diagnosis not present

## 2020-06-25 ENCOUNTER — Ambulatory Visit: Payer: Medicare HMO | Admitting: Dermatology

## 2020-07-02 ENCOUNTER — Encounter: Payer: Self-pay | Admitting: Family Medicine

## 2020-07-02 DIAGNOSIS — Z803 Family history of malignant neoplasm of breast: Secondary | ICD-10-CM | POA: Diagnosis not present

## 2020-07-02 DIAGNOSIS — Z853 Personal history of malignant neoplasm of breast: Secondary | ICD-10-CM | POA: Diagnosis not present

## 2020-07-02 LAB — HM MAMMOGRAPHY

## 2020-07-13 ENCOUNTER — Ambulatory Visit (HOSPITAL_COMMUNITY)
Admission: RE | Admit: 2020-07-13 | Discharge: 2020-07-13 | Disposition: A | Discharge status: Home / Self Care | Payer: Medicare HMO | Source: Ambulatory Visit | Attending: Internal Medicine | Admitting: Internal Medicine

## 2020-07-13 ENCOUNTER — Other Ambulatory Visit: Payer: Self-pay

## 2020-07-13 ENCOUNTER — Inpatient Hospital Stay: Payer: Medicare HMO | Attending: Internal Medicine

## 2020-07-13 DIAGNOSIS — Z902 Acquired absence of lung [part of]: Secondary | ICD-10-CM | POA: Diagnosis not present

## 2020-07-13 DIAGNOSIS — I10 Essential (primary) hypertension: Secondary | ICD-10-CM | POA: Insufficient documentation

## 2020-07-13 DIAGNOSIS — Z7982 Long term (current) use of aspirin: Secondary | ICD-10-CM | POA: Diagnosis not present

## 2020-07-13 DIAGNOSIS — K449 Diaphragmatic hernia without obstruction or gangrene: Secondary | ICD-10-CM | POA: Diagnosis not present

## 2020-07-13 DIAGNOSIS — C3490 Malignant neoplasm of unspecified part of unspecified bronchus or lung: Secondary | ICD-10-CM

## 2020-07-13 DIAGNOSIS — J432 Centrilobular emphysema: Secondary | ICD-10-CM | POA: Diagnosis not present

## 2020-07-13 DIAGNOSIS — C3411 Malignant neoplasm of upper lobe, right bronchus or lung: Secondary | ICD-10-CM | POA: Diagnosis not present

## 2020-07-13 DIAGNOSIS — I2510 Atherosclerotic heart disease of native coronary artery without angina pectoris: Secondary | ICD-10-CM | POA: Diagnosis not present

## 2020-07-13 DIAGNOSIS — Z79899 Other long term (current) drug therapy: Secondary | ICD-10-CM | POA: Diagnosis not present

## 2020-07-13 DIAGNOSIS — Z923 Personal history of irradiation: Secondary | ICD-10-CM | POA: Diagnosis not present

## 2020-07-13 LAB — CMP (CANCER CENTER ONLY)
ALT: 20 U/L (ref 0–44)
AST: 19 U/L (ref 15–41)
Albumin: 3.7 g/dL (ref 3.5–5.0)
Alkaline Phosphatase: 67 U/L (ref 38–126)
Anion gap: 6 (ref 5–15)
BUN: 17 mg/dL (ref 8–23)
CO2: 30 mmol/L (ref 22–32)
Calcium: 9.5 mg/dL (ref 8.9–10.3)
Chloride: 103 mmol/L (ref 98–111)
Creatinine: 0.78 mg/dL (ref 0.44–1.00)
GFR, Estimated: >60 mL/min
Glucose, Bld: 111 mg/dL — ABNORMAL HIGH (ref 70–99)
Potassium: 5 mmol/L (ref 3.5–5.1)
Sodium: 139 mmol/L (ref 135–145)
Total Bilirubin: 0.6 mg/dL (ref 0.3–1.2)
Total Protein: 6.9 g/dL (ref 6.5–8.1)

## 2020-07-13 LAB — CBC WITH DIFFERENTIAL (CANCER CENTER ONLY)
Abs Immature Granulocytes: 0.02 10*3/uL (ref 0.00–0.07)
Basophils Absolute: 0.1 10*3/uL (ref 0.0–0.1)
Basophils Relative: 1 %
Eosinophils Absolute: 0.2 10*3/uL (ref 0.0–0.5)
Eosinophils Relative: 2 %
HCT: 43.9 % (ref 36.0–46.0)
Hemoglobin: 14.7 g/dL (ref 12.0–15.0)
Immature Granulocytes: 0 %
Lymphocytes Relative: 29 %
Lymphs Abs: 2.4 10*3/uL (ref 0.7–4.0)
MCH: 30.9 pg (ref 26.0–34.0)
MCHC: 33.5 g/dL (ref 30.0–36.0)
MCV: 92.4 fL (ref 80.0–100.0)
Monocytes Absolute: 0.6 10*3/uL (ref 0.1–1.0)
Monocytes Relative: 8 %
Neutro Abs: 5.1 10*3/uL (ref 1.7–7.7)
Neutrophils Relative %: 60 %
Platelet Count: 320 10*3/uL (ref 150–400)
RBC: 4.75 MIL/uL (ref 3.87–5.11)
RDW: 13 % (ref 11.5–15.5)
WBC Count: 8.3 10*3/uL (ref 4.0–10.5)
nRBC: 0 % (ref 0.0–0.2)

## 2020-07-15 ENCOUNTER — Other Ambulatory Visit: Payer: Self-pay

## 2020-07-15 ENCOUNTER — Inpatient Hospital Stay: Payer: Medicare HMO | Admitting: Internal Medicine

## 2020-07-15 VITALS — BP 143/55 | HR 72 | Temp 98.0°F | Resp 20 | Ht 65.5 in | Wt 237.7 lb

## 2020-07-15 DIAGNOSIS — I10 Essential (primary) hypertension: Secondary | ICD-10-CM | POA: Diagnosis not present

## 2020-07-15 DIAGNOSIS — C3491 Malignant neoplasm of unspecified part of right bronchus or lung: Secondary | ICD-10-CM

## 2020-07-15 DIAGNOSIS — C3490 Malignant neoplasm of unspecified part of unspecified bronchus or lung: Secondary | ICD-10-CM | POA: Diagnosis not present

## 2020-07-15 DIAGNOSIS — C3411 Malignant neoplasm of upper lobe, right bronchus or lung: Secondary | ICD-10-CM | POA: Diagnosis not present

## 2020-07-15 DIAGNOSIS — Z923 Personal history of irradiation: Secondary | ICD-10-CM | POA: Diagnosis not present

## 2020-07-15 DIAGNOSIS — Z79899 Other long term (current) drug therapy: Secondary | ICD-10-CM | POA: Diagnosis not present

## 2020-07-15 DIAGNOSIS — Z902 Acquired absence of lung [part of]: Secondary | ICD-10-CM | POA: Diagnosis not present

## 2020-07-15 DIAGNOSIS — Z7982 Long term (current) use of aspirin: Secondary | ICD-10-CM | POA: Diagnosis not present

## 2020-07-15 NOTE — Progress Notes (Signed)
[place] [phone]   [phone]  OFFICE PROGRESS NOTE  Jinny Sanders, MD [place] [address]  DIAGNOSIS: Stage IB (T2a, N0, M0) non-small cell lung cancer, squamous cell carcinoma presented with right upper lobe nodule  PRIOR THERAPY:  Status post right upper and right middle lobe bilobectomy as well as lymph node dissections under the care of Dr. Roxan Hockey on April 23, 2018.  The tumor was 1.7 cm in size but there was evidence for visceropleural involvement.  CURRENT THERAPY: Observation.  INTERVAL HISTORY: Hannah Green 75 y.o. female returns to the clinic today for 104-month follow-up visit.  The patient is feeling fine today with no concerning complaints except for gaining few pounds recently.  She also had a fall when she twisted her leg while walking on the sidewalk.  She had few bruises there.  She denied having any current chest pain, shortness of breath, cough or hemoptysis.  She denied having any fever or chills.  She has no nausea, vomiting, diarrhea or constipation.  She has no headache or visual changes.  She is here today for evaluation and repeat CT scan of the chest for restaging of her disease.  MEDICAL HISTORY: Past Medical History:  Diagnosis Date  . Breast cancer ([place]) 1999  . Cataract 2017   resolved with surgery  . COPD (chronic obstructive pulmonary disease) ([place])   . Coronary artery disease   . Dysplastic nevus 09/04/2012   R prox calf - mild   . GERD (gastroesophageal reflux disease)   . Hyperlipidemia   . Postoperative atrial fibrillation ([place]) 03/2018  . Rheumatoid arthritis(714.0)   . Squamous cell carcinoma of lung, stage I, right ([place]) 2020   Status post right upper and middle lobectomy    ALLERGIES:  is allergic to adhesive [tape], neosporin [neomycin-bacitracin zn-polymyx], and penicillins.  MEDICATIONS:  Current Outpatient Medications  Medication Sig Dispense Refill   . albuterol (VENTOLIN HFA) 108 (90 Base) MCG/ACT inhaler TAKE 2 PUFFS BY MOUTH EVERY 6 HOURS AS NEEDED FOR WHEEZE OR SHORTNESS OF BREATH 8.5 g 3  . aspirin EC 81 MG tablet Take 1 tablet (81 mg total) by mouth daily. 90 tablet 3  . atorvastatin (LIPITOR) 10 MG tablet TAKE 1 TABLET BY MOUTH EVERY DAY 90 tablet 0  . cholecalciferol (VITAMIN D3) 25 MCG (1000 UT) tablet Take 1,000 Units by mouth daily.    . TRELEGY ELLIPTA 100-62.5-25 MCG/INH AEPB TAKE 1 PUFF BY MOUTH EVERY DAY 60 each 2   No current facility-administered medications for this visit.    SURGICAL HISTORY:  Past Surgical History:  Procedure Laterality Date  . BREAST LUMPECTOMY  1999   left, needed radiation  . CATARACT EXTRACTION W/ INTRAOCULAR LENS  IMPLANT, BILATERAL Bilateral 05/19/15, 06/02/15  . CHEST TUBE INSERTION Right 04/23/2018   Procedure: Right Lateral Chest Tube Insertion;  Surgeon: Melrose Nakayama, MD;  Location: [facility];  Service: Thoracic;  Laterality: Right;  . CHOLECYSTECTOMY    . LOBECTOMY Right 04/23/2018   Procedure: RIGHT UPPER LOBECTOMY;  Surgeon: Melrose Nakayama, MD;  Location: [facility];  Service: Thoracic;  Laterality: Right;  . NM MYOVIEW LTD  01/2016   LOW RISK. Hypertensive response to exercise followed by significant drop (stage I pressure 211/70 drop to 118/75. Stage II). Apical artifact but otherwise no ischemia or infarction. EF 64%.  Marland Kitchen NODE DISSECTION Right 04/23/2018   Procedure: MEDIASTINAL NODE DISSECTION;  Surgeon: Modesto Charon  C, MD;  Location: [facility];  Service: Thoracic;  Laterality: Right;  . RIGHT/LEFT HEART CATH AND CORONARY ANGIOGRAPHY N/A 04/10/2018   Procedure: RIGHT/LEFT HEART CATH AND CORONARY ANGIOGRAPHY;  Surgeon: Leonie Man, MD;  Location: [facility] CV [facility];; angiographically minimal coronary artery disease.  Normal LVEDP.  Basely normal RHC pressures with no suggestion of pulmonary hypertension.  Normal CO-CI by Fick (5.9-2.8)  . TONSILLECTOMY    . TRANSTHORACIC  ECHOCARDIOGRAM  11/'17; 2/'20   a) Nov 2017: Mild LVH. EF 60-65%. GR 2-DD; b) Feb 2020: Normal LV size and function.  EF 60 to 65%.  No R WMA.  GR 1 DD.  Normal RV size and function.  Normal pressures.  No valvular disease.  Marland Kitchen VIDEO ASSISTED THORACOSCOPY (VATS)/WEDGE RESECTION Right 04/23/2018   Procedure: VIDEO ASSISTED THORACOSCOPY (VATS)/WEDGE RESECTION OF RIGHT UPPER LUNG;  Surgeon: Melrose Nakayama, MD;  Location: [facility] OR;  Service: Thoracic;  Laterality: Right;    REVIEW OF SYSTEMS:  A comprehensive review of systems was negative.   PHYSICAL EXAMINATION: General appearance: alert, cooperative and no distress Head: Normocephalic, without obvious abnormality, atraumatic Neck: no adenopathy, no JVD, supple, symmetrical, trachea midline and thyroid not enlarged, symmetric, no tenderness/mass/nodules Lymph nodes: Cervical, supraclavicular, and axillary nodes normal. Resp: clear to auscultation bilaterally Back: symmetric, no curvature. ROM normal. No CVA tenderness. Cardio: regular rate and rhythm, S1, S2 normal, no murmur, click, rub or gallop GI: soft, non-tender; bowel sounds normal; no masses,  no organomegaly Extremities: extremities normal, atraumatic, no cyanosis or edema  ECOG PERFORMANCE STATUS: 1 - Symptomatic but completely ambulatory  Blood pressure (!) 143/55, pulse 72, temperature 98 F (36.7 C), temperature source Tympanic, resp. rate 20, height 5' 5.5" (1.664 m), weight 237 lb 11.2 oz (107.8 kg), SpO2 100 %.  LABORATORY DATA: Lab Results  Component Value Date   WBC 8.3 07/13/2020   HGB 14.7 07/13/2020   HCT 43.9 07/13/2020   MCV 92.4 07/13/2020   PLT 320 07/13/2020      Chemistry      Component Value Date/Time   NA 139 07/13/2020 0939   NA 140 04/04/2018 1643   K 5.0 07/13/2020 0939   CL 103 07/13/2020 0939   CO2 30 07/13/2020 0939   BUN 17 07/13/2020 0939   BUN 13 04/04/2018 1643   CREATININE 0.78 07/13/2020 0939   CREATININE 0.61 06/01/2012 1452       Component Value Date/Time   CALCIUM 9.5 07/13/2020 0939   ALKPHOS 67 07/13/2020 0939   AST 19 07/13/2020 0939   ALT 20 07/13/2020 0939   BILITOT 0.6 07/13/2020 0939       RADIOGRAPHIC STUDIES: CT CHEST WO CONTRAST  Result Date: 07/14/2020 CLINICAL DATA:  Non-small-cell lung cancer.  Restaging. EXAM: CT CHEST WITHOUT CONTRAST TECHNIQUE: Multidetector CT imaging of the chest was performed following the standard protocol without IV contrast. COMPARISON:  01/14/2020 FINDINGS: Cardiovascular: The heart size is normal. No substantial pericardial effusion. Coronary artery calcification is evident. Atherosclerotic calcification is noted in the wall of the thoracic aorta. Mediastinum/Nodes: 10 mm exophytic nodule posterior right thyroid lobe is stable. No mediastinal lymphadenopathy. No evidence for gross hilar lymphadenopathy although assessment is limited by the lack of intravenous contrast on today's study. Small hiatal hernia. The esophagus has normal imaging features. There is no axillary lymphadenopathy. Surgical clips in the right axilla are compatible with prior dissection. Lungs/Pleura: Centrilobular and paraseptal emphysema evident. Volume loss right hemithorax compatible with prior right upper lobectomy. Calcified granuloma  noted posterior right lung. No suspicious pulmonary nodule or mass. Tiny right pulmonary nodule on 43/7 is unchanged. 4 mm medial left upper lobe pulmonary nodule on 46/7 is similar to prior. A second 3 mm nodule in the medial left upper lobe (41/7) is also stable. No new suspicious pulmonary nodule or mass. Upper Abdomen: Unremarkable. Musculoskeletal: No worrisome lytic or sclerotic osseous abnormality. IMPRESSION: 1. Stable exam. No new or progressive findings to suggest recurrent or metastatic disease in the chest. 2. Stable tiny bilateral pulmonary nodules. 3. Small hiatal hernia. 4. Aortic Atherosclerosis (ICD10-I70.0) and Emphysema (ICD10-J43.9). Electronically Signed    By: Misty Stanley M.D.   On: 07/14/2020 08:41    ASSESSMENT AND PLAN: This is a very pleasant 75 years old white female with a stage Ib non-small cell lung cancer status post right upper and middle lobectomies with lymph node dissection in February 2020.   The patient has been in observation since her surgery and she is feeling fine today with no concerning complaints. She had repeat CT scan of the chest performed recently.  I personally and independently reviewed the scans and discussed the results with the patient today. Her scan showed no concerning findings for disease recurrence or metastasis. I recommended for her to continue on observation with repeat CT scan of the chest in 1 year. The patient was advised to call immediately if she has any other concerning symptoms in the interval. The patient voices understanding of current disease status and treatment options and is in agreement with the current care plan. All questions were answered. The patient knows to call the clinic with any problems, questions or concerns. We can certainly see the patient much sooner if necessary.   Disclaimer: This note was dictated with voice recognition software. Similar sounding words can inadvertently be transcribed and may not be corrected upon review.

## 2020-07-16 ENCOUNTER — Ambulatory Visit: Payer: Medicare HMO | Admitting: Cardiology

## 2020-07-16 ENCOUNTER — Telehealth: Payer: Self-pay | Admitting: Internal Medicine

## 2020-07-16 NOTE — Telephone Encounter (Signed)
Scheduled per los. Mailed printout  °

## 2020-08-14 ENCOUNTER — Ambulatory Visit: Payer: Medicare HMO | Admitting: General Practice

## 2020-08-17 ENCOUNTER — Ambulatory Visit: Payer: Medicare HMO | Admitting: Physician Assistant

## 2020-08-17 ENCOUNTER — Encounter: Payer: Self-pay | Admitting: Physician Assistant

## 2020-08-17 ENCOUNTER — Ambulatory Visit (HOSPITAL_COMMUNITY)
Admission: RE | Admit: 2020-08-17 | Discharge: 2020-08-17 | Disposition: A | Discharge status: Home / Self Care | Payer: Medicare HMO | Source: Ambulatory Visit | Attending: Cardiology | Admitting: Cardiology

## 2020-08-17 ENCOUNTER — Other Ambulatory Visit: Payer: Self-pay | Admitting: Physician Assistant

## 2020-08-17 ENCOUNTER — Other Ambulatory Visit: Payer: Self-pay

## 2020-08-17 VITALS — BP 120/66 | HR 67 | Ht 65.5 in | Wt 236.0 lb

## 2020-08-17 DIAGNOSIS — M79604 Pain in right leg: Secondary | ICD-10-CM

## 2020-08-17 DIAGNOSIS — I480 Paroxysmal atrial fibrillation: Secondary | ICD-10-CM | POA: Diagnosis not present

## 2020-08-17 DIAGNOSIS — M79605 Pain in left leg: Secondary | ICD-10-CM | POA: Insufficient documentation

## 2020-08-17 DIAGNOSIS — R0789 Other chest pain: Secondary | ICD-10-CM | POA: Diagnosis not present

## 2020-08-17 NOTE — Progress Notes (Signed)
Cardiology Office Note:    Date:  08/19/2020   ID:  Hannah Green, DOB 09/12/1945, MRN [id number]  PCP:  Hannah Sanders, MD   [facility] Providers Cardiologist:  Hannah Hew, MD     Referring MD: Hannah Sanders, MD   Chief Complaint  Patient presents with   Follow-up    12 months.    History of Present Illness:    Hannah Green is a 75 y.o. female with a hx of severe COPD, rheumatoid arthritis, history of breast cancer in 1999, hyperlipidemia, and squamous cell carcinoma of right lung s/p right-sided VATS and bilobectomy complicated by postop atrial fibrillation.  Previous left and right heart cath performed on 04/10/2018 showed normal coronary arteries, normal right heart cath pressure with no evidence of pulmonary hypertension, cardiac output 5.9, cardiac index 2.8.  Patient was last seen by Dr. Ellyn Green on 08/29/2019 at which time she complained of right greater than left lower extremity edema.  There was no sign of significant volume overload on exam.  She also noticed intermittent tachycardia palpitation and diffuse chest discomfort.  Lower extremity venous Doppler obtained on 09/17/2019 showed no DVT.  Repeat echocardiogram obtained on 09/23/2019 showed EF 70 to 75%, hyperdynamic LV, no regional wall motion abnormality, normal RV function, mild MR.   Patient presents today for follow-up.  She still occasionally notice some leg swelling, however there is no leg swelling on exam today.  She does notice occasional pain in the leg with walking, I will obtain ABI.  Recent CT of the chest without contrast showed no metastatic disease, aortic atherosclerosis, she has a single speck of calcium in the coronary vessels.  She denies any obvious anginal symptoms.  She does occasionally have a sharp pain that is only lasted for a few seconds before going away.  This does not sound cardiac.  I instructed the patient to contact cardiology service if her symptom began to increase in frequency or  duration.   Past Medical History:  Diagnosis Date   Breast cancer ([place]) 1999   Cataract 2017   resolved with surgery   COPD (chronic obstructive pulmonary disease) ([place])    Coronary artery disease    Dysplastic nevus 09/04/2012   R prox calf - mild    GERD (gastroesophageal reflux disease)    Hyperlipidemia    Postoperative atrial fibrillation ([place]) 03/2018   Rheumatoid arthritis(714.0)    Squamous cell carcinoma of lung, stage I, right ([place]) 2020   Status post right upper and middle lobectomy    Past Surgical History:  Procedure Laterality Date   BREAST LUMPECTOMY  1999   left, needed radiation   CATARACT EXTRACTION W/ INTRAOCULAR LENS  IMPLANT, BILATERAL Bilateral 05/19/15, 06/02/15   CHEST TUBE INSERTION Right 04/23/2018   Procedure: Right Lateral Chest Tube Insertion;  Surgeon: Hannah Nakayama, MD;  Location: [facility];  Service: Thoracic;  Laterality: Right;   CHOLECYSTECTOMY     LOBECTOMY Right 04/23/2018   Procedure: RIGHT UPPER LOBECTOMY;  Surgeon: Hannah Nakayama, MD;  Location: [facility];  Service: Thoracic;  Laterality: Right;   NM MYOVIEW LTD  01/2016   LOW RISK. Hypertensive response to exercise followed by significant drop (stage I pressure 211/70 drop to 118/75. Stage II). Apical artifact but otherwise no ischemia or infarction. EF 64%.   NODE DISSECTION Right 04/23/2018   Procedure: MEDIASTINAL NODE DISSECTION;  Surgeon: Hannah Nakayama, MD;  Location: [facility];  Service: Thoracic;  Laterality: Right;   RIGHT/LEFT  HEART CATH AND CORONARY ANGIOGRAPHY N/A 04/10/2018   Procedure: RIGHT/LEFT HEART CATH AND CORONARY ANGIOGRAPHY;  Surgeon: Leonie Man, MD;  Location: [facility] CV LAB;; angiographically minimal coronary artery disease.  Normal LVEDP.  Basely normal RHC pressures with no suggestion of pulmonary hypertension.  Normal CO-CI by Fick (5.9-2.8)   TONSILLECTOMY     TRANSTHORACIC ECHOCARDIOGRAM  11/'17; 2/'20   a) Nov 2017: Mild LVH. EF 60-65%. GR 2-DD;  b) Feb 2020: Normal LV size and function.  EF 60 to 65%.  No R WMA.  GR 1 DD.  Normal RV size and function.  Normal pressures.  No valvular disease.   VIDEO ASSISTED THORACOSCOPY (VATS)/WEDGE RESECTION Right 04/23/2018   Procedure: VIDEO ASSISTED THORACOSCOPY (VATS)/WEDGE RESECTION OF RIGHT UPPER LUNG;  Surgeon: Hannah Nakayama, MD;  Location: [facility];  Service: Thoracic;  Laterality: Right;    Current Medications: Current Meds  Medication Sig   albuterol (VENTOLIN HFA) 108 (90 Base) MCG/ACT inhaler TAKE 2 PUFFS BY MOUTH EVERY 6 HOURS AS NEEDED FOR WHEEZE OR SHORTNESS OF BREATH   aspirin EC 81 MG tablet Take 1 tablet (81 mg total) by mouth daily.   atorvastatin (LIPITOR) 10 MG tablet TAKE 1 TABLET BY MOUTH EVERY DAY   cholecalciferol (VITAMIN D3) 25 MCG (1000 UT) tablet Take 1,000 Units by mouth daily.   TRELEGY ELLIPTA 100-62.5-25 MCG/INH AEPB TAKE 1 PUFF BY MOUTH EVERY DAY     Allergies:   Adhesive [tape], Neosporin [neomycin-bacitracin zn-polymyx], and Penicillins   Social History   Socioeconomic History   Marital status: Divorced    Spouse name: Not on file   Number of children: 2   Years of education: Not on file   Highest education level: Not on file  Occupational History   Occupation: Retired Freight forwarder    Comment: Veterinary surgeon  Tobacco Use   Smoking status: Former    Packs/day: 1.50    Years: 50.00    Pack years: 75.00    Types: Cigarettes    Quit date: 04/01/2011    Years since quitting: 9.3   Smokeless tobacco: Never   Tobacco comments:    has stopped but has started again in "crisis"  Vaping Use   Vaping Use: Never used  Substance and Sexual Activity   Alcohol use: Yes    Alcohol/week: 2.0 standard drinks    Types: 2 Glasses of wine per week    Comment: monthly   Drug use: No   Sexual activity: Never  Other Topics Concern   Not on file  Social History Narrative   Lives alone   No living will   Requests friend Hannah Green to make health care decisions.    Would accept resuscitation but no prolonged ventilation   Would not want tube feeds if cognitively unaware   Social Determinants of Health   Financial Resource Strain: Low Risk    Difficulty of Paying Living Expenses: Not hard at all  Food Insecurity: No Food Insecurity   Worried About Charity fundraiser in the Last Year: Never true   Ran Out of Food in the Last Year: Never true  Transportation Needs: No Transportation Needs   Lack of Transportation (Medical): No   Lack of Transportation (Non-Medical): No  Physical Activity: Insufficiently Active   Days of Exercise per Week: 2 days   Minutes of Exercise per Session: 60 min  Stress: No Stress Concern Present   Feeling of Stress : Not at all  Social Connections: Not  on file     Family History: The patient's family history includes Cancer in her father, mother, and sister; Diabetes in her maternal grandmother and paternal uncle.  ROS:   Please see the history of present illness.     All other systems reviewed and are negative.  EKGs/Labs/Other Studies Reviewed:    The following studies were reviewed today:  Cath 04/10/2018 Normal coronary arteries LV end diastolic pressure is normal. (Echocardiogram showed normal EF) Normal right heart cath pressures with no evidence pulmonary pretension Normal cardiac output/index   SUMMARY Angiographically normal coronary arteries Normal Right Heart Cath Pressures with no evidence of Pulmonary Hypertension Normal Cardiac Output-Index: 5.9-2.8   --NON-ANGINAL CHEST PAIN AND DYSPNEA   RECOMMENDATIONS Discharge home after bedrest.  Standard post cath follow-up Suspect chest pain dyspnea related to pulmonary issue. No impediment to now proceed with lung surgery.   Echo 09/23/2019  1. Global longitudinal strain is -14.6%. Left ventricular ejection  fraction, by estimation, is 70 to 75%. The left ventricle has hyperdynamic  function. The left ventricle has no regional wall motion  abnormalities.  There is mild left ventricular  hypertrophy. Left ventricular diastolic parameters were normal.   2. Right ventricular systolic function is normal. The right ventricular  size is normal.   3. The mitral valve is abnormal. Mild mitral valve regurgitation.   4. The aortic valve is normal in structure. Aortic valve regurgitation is  not visualized.   5. The inferior vena cava is normal in size with greater than 50%  respiratory variability, suggesting right atrial pressure of 3 mmHg.  EKG:  EKG is ordered today.  The ekg ordered today demonstrates normal sinus rhythm, no significant ST-T wave changes.  Recent Labs: 02/18/2020: TSH 3.02 07/13/2020: ALT 20; BUN 17; Creatinine 0.78; Hemoglobin 14.7; Platelet Count 320; Potassium 5.0; Sodium 139  Recent Lipid Panel    Component Value Date/Time   CHOL 179 02/11/2020 0937   TRIG 162.0 (H) 02/11/2020 0937   HDL 58.40 02/11/2020 0937   CHOLHDL 3 02/11/2020 0937   VLDL 32.4 02/11/2020 0937   LDLCALC 88 02/11/2020 0937   LDLDIRECT 136.2 06/03/2011 0836     Risk Assessment/Calculations:    CHA2DS2-VASc Score = 3  This indicates a 3.2% annual risk of stroke. The patient's score is based upon: CHF History: No HTN History: No Diabetes History: No Stroke History: No Vascular Disease History: No Age Score: 2 Gender Score: 1         Physical Exam:    VS:  BP 120/66 (BP Location: Left Arm, Patient Position: Sitting, Cuff Size: Large)   Pulse 67   Ht 5' 5.5" (1.664 m)   Wt 236 lb (107 kg)   LMP  (LMP Unknown)   BMI 38.68 kg/m     Wt Readings from Last 3 Encounters:  08/17/20 236 lb (107 kg)  07/15/20 237 lb 11.2 oz (107.8 kg)  02/18/20 231 lb 4 oz (104.9 kg)     GEN:  Well nourished, well developed in no acute distress HEENT: Normal NECK: No JVD; No carotid bruits LYMPHATICS: No lymphadenopathy CARDIAC: RRR, no murmurs, rubs, gallops RESPIRATORY:  Clear to auscultation without rales, wheezing or rhonchi   ABDOMEN: Soft, non-tender, non-distended MUSCULOSKELETAL:  No edema; No deformity  SKIN: Warm and dry NEUROLOGIC:  Alert and oriented x 3 PSYCHIATRIC:  Normal affect   ASSESSMENT:    1. Pain in both lower extremities   2. Atypical chest pain   3. PAF (paroxysmal atrial fibrillation) (  [place])    PLAN:    In order of problems listed above:  Lower extremity pain: I recommended bilateral ABI.  Atypical chest pain: She occasionally has a sharp pain that only last a few seconds before going away.  Previous left and right heart cath showed a normal coronary arteries.  No further work-up at this time  PAF: Solitary episode of postop atrial fibrillation occurred in the setting of lung surgery.  No recurrence.        Medication Adjustments/Labs and Tests Ordered: Current medicines are reviewed at length with the patient today.  Concerns regarding medicines are outlined above.  Orders Placed This Encounter  Procedures   EKG 12-Lead   No orders of the defined types were placed in this encounter.   Patient Instructions  Medication Instructions:  Your physician recommends that you continue on your current medications as directed. Please refer to the Current Medication list given to you today.  *If you need a refill on your cardiac medications before your next appointment, please call your pharmacy*  Lab Work: NONE ordered at this time of appointment   If you have labs (blood work) drawn today and your tests are completely normal, you will receive your results only by: [place] (if you have MyChart) OR A paper copy in the mail If you have any lab test that is abnormal or we need to change your treatment, we will call you to review the results.  Testing/Procedures: Your physician has requested that you have an ankle brachial index (ABI). During this test an ultrasound and blood pressure cuff are used to evaluate the arteries that supply the arms and legs with blood. Allow  thirty minutes for this exam. There are no restrictions or special instructions.  Please schedule for 1-2 weeks   Follow-Up: At [facility], you and your health needs are our priority.  As part of our continuing mission to provide you with exceptional heart care, we have created designated Provider Care Teams.  These Care Teams include your primary Cardiologist (physician) and Advanced Practice Providers (APPs -  Physician Assistants and Nurse Practitioners) who all work together to provide you with the care you need, when you need it.  Your next appointment:   12 month(s)  The format for your next appointment:   In Person  Provider:   Glenetta Hew, MD   Other Instructions    Signed, Almyra Deforest, [place]  08/19/2020 10:14 PM    [place]

## 2020-08-17 NOTE — Patient Instructions (Signed)
Medication Instructions:  Your physician recommends that you continue on your current medications as directed. Please refer to the Current Medication list given to you today.  *If you need a refill on your cardiac medications before your next appointment, please call your pharmacy*  Lab Work: NONE ordered at this time of appointment   If you have labs (blood work) drawn today and your tests are completely normal, you will receive your results only by: [place] (if you have MyChart) OR A paper copy in the mail If you have any lab test that is abnormal or we need to change your treatment, we will call you to review the results.  Testing/Procedures: Your physician has requested that you have an ankle brachial index (ABI). During this test an ultrasound and blood pressure cuff are used to evaluate the arteries that supply the arms and legs with blood. Allow thirty minutes for this exam. There are no restrictions or special instructions.  Please schedule for 1-2 weeks   Follow-Up: At [facility], you and your health needs are our priority.  As part of our continuing mission to provide you with exceptional heart care, we have created designated Provider Care Teams.  These Care Teams include your primary Cardiologist (physician) and Advanced Practice Providers (APPs -  Physician Assistants and Nurse Practitioners) who all work together to provide you with the care you need, when you need it.  Your next appointment:   12 month(s)  The format for your next appointment:   In Person  Provider:   Glenetta Hew, MD   Other Instructions

## 2020-08-21 NOTE — Progress Notes (Signed)
Normal ABI bilaterally showing good blood flow without significant restriction.

## 2020-09-23 ENCOUNTER — Other Ambulatory Visit: Payer: Self-pay | Admitting: Acute Care

## 2020-10-12 ENCOUNTER — Ambulatory Visit: Payer: Medicare HMO | Admitting: Dermatology

## 2020-10-12 ENCOUNTER — Other Ambulatory Visit: Payer: Self-pay

## 2020-10-12 DIAGNOSIS — L719 Rosacea, unspecified: Secondary | ICD-10-CM | POA: Diagnosis not present

## 2020-10-12 DIAGNOSIS — D1800 Hemangioma unspecified site: Secondary | ICD-10-CM

## 2020-10-12 DIAGNOSIS — L720 Epidermal cyst: Secondary | ICD-10-CM

## 2020-10-12 DIAGNOSIS — L578 Other skin changes due to chronic exposure to nonionizing radiation: Secondary | ICD-10-CM | POA: Diagnosis not present

## 2020-10-12 DIAGNOSIS — D485 Neoplasm of uncertain behavior of skin: Secondary | ICD-10-CM

## 2020-10-12 DIAGNOSIS — Z85828 Personal history of other malignant neoplasm of skin: Secondary | ICD-10-CM

## 2020-10-12 DIAGNOSIS — L905 Scar conditions and fibrosis of skin: Secondary | ICD-10-CM

## 2020-10-12 DIAGNOSIS — L820 Inflamed seborrheic keratosis: Secondary | ICD-10-CM | POA: Diagnosis not present

## 2020-10-12 DIAGNOSIS — D1801 Hemangioma of skin and subcutaneous tissue: Secondary | ICD-10-CM | POA: Diagnosis not present

## 2020-10-12 DIAGNOSIS — L814 Other melanin hyperpigmentation: Secondary | ICD-10-CM

## 2020-10-12 DIAGNOSIS — Z1283 Encounter for screening for malignant neoplasm of skin: Secondary | ICD-10-CM

## 2020-10-12 DIAGNOSIS — L821 Other seborrheic keratosis: Secondary | ICD-10-CM | POA: Diagnosis not present

## 2020-10-12 DIAGNOSIS — D229 Melanocytic nevi, unspecified: Secondary | ICD-10-CM

## 2020-10-12 NOTE — Patient Instructions (Addendum)
Instructions for [facility] Medications  One or more of your medications was sent to the [facility] mail order compounding pharmacy. You will receive an email from them and can purchase the medicine through that link. It will then be mailed to your home at the address you confirmed. If for any reason you do not receive an email from them, please check your spam folder. If you still do not find the email, please let us know. [facility] phone number is [phone].   Cryotherapy Aftercare  Wash gently with soap and water everyday.   Apply Vaseline and Band-Aid daily until healed.    Wound Care Instructions  Cleanse wound gently with soap and water once a day then pat dry with clean gauze. Apply a thing coat of Petrolatum (petroleum jelly, "Vaseline") over the wound (unless you have an allergy to this). We recommend that you use a new, sterile tube of Vaseline. Do not pick or remove scabs. Do not remove the yellow or white "healing tissue" from the base of the wound.  Cover the wound with fresh, clean, nonstick gauze and secure with paper tape. You may use Band-Aids in place of gauze and tape if the would is small enough, but would recommend trimming much of the tape off as there is often too much. Sometimes Band-Aids can irritate the skin.  You should call the office for your biopsy report after 1 week if you have not already been contacted.  If you experience any problems, such as abnormal amounts of bleeding, swelling, significant bruising, significant pain, or evidence of infection, please call the office immediately.  FOR ADULT SURGERY PATIENTS: If you need something for pain relief you may take 1 extra strength Tylenol (acetaminophen) AND 2 Ibuprofen (200mg  each) together every 4 hours as needed for pain. (do not take these if you are allergic to them or if you have a reason you should not take them.) Typically, you may only need pain medication for 1 to 3 days.    If you  have any questions or concerns for your doctor, please call our main line at [phone] and press option 4 to reach your doctor's medical assistant. If no one answers, please leave a voicemail as directed and we will return your call as soon as possible. Messages left after 4 pm will be answered the following business day.   You may also send [place] a message via [place]. We typically respond to MyChart messages within 1-2 business days.  For prescription refills, please ask your pharmacy to contact our office. Our fax number is [phone].  If you have an urgent issue when the clinic is closed that cannot wait until the next business day, you can page your doctor at the number below.    Please note that while we do our best to be available for urgent issues outside of office hours, we are not available 24/7.   If you have an urgent issue and are unable to reach [place], you may choose to seek medical care at your doctor's office, retail clinic, urgent care center, or emergency room.  If you have a medical emergency, please immediately call 911 or go to the emergency department.  Pager Numbers  - Dr. Nehemiah Massed: [phone]  - Dr. Laurence Ferrari: [phone]  - Dr. Nicole Kindred: [phone]  In the event of inclement weather, please call our main line at [phone] for an update on the status of any delays or closures.  Dermatology Medication Tips: Please keep the boxes  that topical medications come in in order to help keep track of the instructions about where and how to use these. Pharmacies typically print the medication instructions only on the boxes and not directly on the medication tubes.   If your medication is too expensive, please contact our office at [phone] option 4 or send [place] a message through [place].   We are unable to tell what your co-pay for medications will be in advance as this is different depending on your insurance coverage. However, we may be able to find a substitute  medication at lower cost or fill out paperwork to get insurance to cover a needed medication.   If a prior authorization is required to get your medication covered by your insurance company, please allow [place] 1-2 business days to complete this process.  Drug prices often vary depending on where the prescription is filled and some pharmacies may offer cheaper prices.  The website www.goodrx.com contains coupons for medications through different pharmacies. The prices here do not account for what the cost may be with help from insurance (it may be cheaper with your insurance), but the website can give you the price if you did not use any insurance.  - You can print the associated coupon and take it with your prescription to the pharmacy.  - You may also stop by our office during regular business hours and pick up a GoodRx coupon card.  - If you need your prescription sent electronically to a different pharmacy, notify our office through [facility] or by phone at [phone] option 4.

## 2020-10-12 NOTE — Progress Notes (Signed)
New Patient Visit  Subjective  Hannah Green is a 75 y.o. female who presents for the following: Annual Exam (History of BCC - TBSE today). The patient presents for Total-Body Skin Exam (TBSE) for skin cancer screening and mole check.   The following portions of the chart were reviewed this encounter and updated as appropriate:   Tobacco  Allergies  Meds  Problems  Med Hx  Surg Hx  Fam Hx     Review of Systems:  No other skin or systemic complaints except as noted in HPI or Assessment and Plan.  Objective  Well appearing patient in no apparent distress; mood and affect are within normal limits.  A full examination was performed including scalp, head, eyes, ears, nose, lips, neck, chest, axillae, abdomen, back, buttocks, bilateral upper extremities, bilateral lower extremities, hands, feet, fingers, toes, fingernails, and toenails. All findings within normal limits unless otherwise noted below.  Left cheek Scar  Face Dilated blood vessels  Face Smooth white papule(s).   Left Zygomatic Area (3) Erythematous keratotic or waxy stuck-on papule or plaque.   Right Shoulder - Anterior 0.6 cm dark macule        Assessment & Plan   History of Basal Cell Carcinoma of the Skin - No evidence of recurrence today - Recommend regular full body skin exams - Recommend daily broad spectrum sunscreen SPF 30+ to sun-exposed areas, reapply every 2 hours as needed.  - Call if any new or changing lesions are noted between office visits  History of Dysplastic Nevi - No evidence of recurrence today - Recommend regular full body skin exams - Recommend daily broad spectrum sunscreen SPF 30+ to sun-exposed areas, reapply every 2 hours as needed.  - Call if any new or changing lesions are noted between office visits  Lentigines - Scattered tan macules - Due to sun exposure - Benign-appering, observe - Recommend daily broad spectrum sunscreen SPF 30+ to sun-exposed areas,  reapply every 2 hours as needed. - Call for any changes  Seborrheic Keratoses - Stuck-on, waxy, tan-brown papules and/or plaques  - Benign-appearing - Discussed benign etiology and prognosis. - Observe - Call for any changes  Melanocytic Nevi - Tan-brown and/or pink-flesh-colored symmetric macules and papules - Benign appearing on exam today - Observation - Call clinic for new or changing moles - Recommend daily use of broad spectrum spf 30+ sunscreen to sun-exposed areas.   Hemangiomas - Red papules - Discussed benign nature - Observe - Call for any changes  Actinic Damage - Chronic condition, secondary to cumulative UV/sun exposure - diffuse scaly erythematous macules with underlying dyspigmentation - Recommend daily broad spectrum sunscreen SPF 30+ to sun-exposed areas, reapply every 2 hours as needed.  - Staying in the shade or wearing long sleeves, sun glasses (UVA+UVB protection) and wide brim hats (4-inch brim around the entire circumference of the hat) are also recommended for sun protection.  - Call for new or changing lesions.  Skin cancer screening performed today.  Scar Left cheek History of BCC - Recheck on follow up. May consider biopsy if change or evidence of recurrence.  Rosacea Face Will prescribe [facility] metronidazole/ivermectin/azelaic acid twice daily as needed to affected areas on the face. The patient was advised this is not covered by insurance since it is made by a compounding pharmacy. They will receive an email to check out and the medication will be mailed to their home.    [place] Face Benign-appearing.  Observation.  Call clinic for new  or changing lesions.  Recommend daily use of broad spectrum spf 30+ sunscreen to sun-exposed areas.   Inflamed seborrheic keratosis Left Zygomatic Area  Destruction of lesion - Left Zygomatic Area Complexity: simple   Destruction method: cryotherapy   Informed consent: discussed and consent obtained    Timeout:  patient name, date of birth, surgical site, and procedure verified Lesion destroyed using liquid nitrogen: Yes   Region frozen until ice ball extended beyond lesion: Yes   Outcome: patient tolerated procedure well with no complications   Post-procedure details: wound care instructions given    Neoplasm of uncertain behavior of skin Right Shoulder - Anterior  Epidermal / dermal shaving  Lesion diameter (cm):  0.6 Informed consent: discussed and consent obtained   Timeout: patient name, date of birth, surgical site, and procedure verified   Procedure prep:  Patient was prepped and draped in usual sterile fashion Prep type:  Isopropyl alcohol Anesthesia: the lesion was anesthetized in a standard fashion   Anesthetic:  1% lidocaine w/ epinephrine 1-100,000 buffered w/ 8.4% NaHCO3 Instrument used: flexible razor blade   Hemostasis achieved with: pressure, aluminum chloride and electrodesiccation   Outcome: patient tolerated procedure well   Post-procedure details: sterile dressing applied and wound care instructions given   Dressing type: bandage and petrolatum    Specimen 1 - Surgical pathology Differential Diagnosis: Nevus vs dysplastic nevus Check Margins: No  Return for 4-6 weeks to recheck rosacea and scar.   I, Ashok Cordia, CMA, am acting as scribe for Sarina Ser, MD . Documentation: I have reviewed the above documentation for accuracy and completeness, and I agree with the above.  Sarina Ser, MD

## 2020-10-14 ENCOUNTER — Encounter: Payer: Self-pay | Admitting: Dermatology

## 2020-10-19 ENCOUNTER — Telehealth: Payer: Self-pay

## 2020-10-19 NOTE — Telephone Encounter (Signed)
-----   Message from Ralene Bathe, MD sent at 10/17/2020  6:43 PM EDT ----- Diagnosis Skin , right shoulder - anterior HEMANGIOMA WITH THROMBOSIS  Benign hemangioma No further treatment needed

## 2020-10-19 NOTE — Telephone Encounter (Signed)
LM on VM please return my call  

## 2020-10-21 ENCOUNTER — Telehealth: Payer: Self-pay

## 2020-10-21 NOTE — Telephone Encounter (Signed)
-----   Message from Ralene Bathe, MD sent at 10/17/2020  6:43 PM EDT ----- Diagnosis Skin , right shoulder - anterior HEMANGIOMA WITH THROMBOSIS  Benign hemangioma No further treatment needed

## 2020-10-21 NOTE — Telephone Encounter (Signed)
Patient advised of BX results .aw 

## 2020-11-16 ENCOUNTER — Other Ambulatory Visit: Payer: Self-pay | Admitting: Acute Care

## 2020-11-16 ENCOUNTER — Ambulatory Visit: Payer: Medicare HMO | Admitting: Dermatology

## 2020-11-16 ENCOUNTER — Other Ambulatory Visit: Payer: Self-pay

## 2020-11-16 DIAGNOSIS — L570 Actinic keratosis: Secondary | ICD-10-CM | POA: Diagnosis not present

## 2020-11-16 DIAGNOSIS — Z85828 Personal history of other malignant neoplasm of skin: Secondary | ICD-10-CM

## 2020-11-16 DIAGNOSIS — L578 Other skin changes due to chronic exposure to nonionizing radiation: Secondary | ICD-10-CM

## 2020-11-16 DIAGNOSIS — L820 Inflamed seborrheic keratosis: Secondary | ICD-10-CM

## 2020-11-16 DIAGNOSIS — L719 Rosacea, unspecified: Secondary | ICD-10-CM | POA: Diagnosis not present

## 2020-11-16 NOTE — Progress Notes (Signed)
Follow-Up Visit   Subjective  Hannah Green is a 75 y.o. female who presents for the following: Follow-up (Patient here today for 5 week follow up for rosacea. Patient is using [facility] triple cream and is happy with results she is seeing. Patient also here for follow up on scar at right cheek history of bcc. ).  The following portions of the chart were reviewed this encounter and updated as appropriate:  Tobacco  Allergies  Meds  Problems  Med Hx  Surg Hx  Fam Hx     Review of Systems: No other skin or systemic complaints except as noted in HPI or Assessment and Plan.  Objective  Well appearing patient in no apparent distress; mood and affect are within normal limits.  A focused examination was performed including face, cheeks, right shoulder. Relevant physical exam findings are noted in the Assessment and Plan.  Head - Anterior (Face) Mid face erythema with telangiectasias plus scattered inflammatory papules.   left medial cheek x 1 Indented scar with scale at left medial cheek      left cheek x 4, right cheek x 1 (5) Erythematous keratotic or waxy stuck-on papule or plaque.   Assessment & Plan  Rosacea Head - Anterior (Face)  Rosacea is a chronic progressive skin condition usually affecting the face of adults, causing redness and/or acne bumps. It is treatable but not curable. It sometimes affects the eyes (ocular rosacea) as well. It may respond to topical and/or systemic medication and can flare with stress, sun exposure, alcohol, exercise and some foods.  Daily application of broad spectrum spf 30+ sunscreen to face is recommended to reduce flares.  Chronic condition with duration or expected duration over one year. Condition is bothersome to patient.   Continue [facility] Rosacea Triple Cream as prescribed daily to affected areas of face.  10 Rf sent to [facility]   Actinic keratosis within scar of previous BCC Left medial cheek treated by  another doctor in remote past.  If not clear at follow up, may consider biopsy left medial cheek x 1  Scar Bcc biopsy site at left medial cheek  Will recheck at follow up, if area is not resolved will consider biopsy if change or evidence of recurrence.   Actinic keratoses are precancerous spots that appear secondary to cumulative UV radiation exposure/sun exposure over time. They are chronic with expected duration over 1 year. A portion of actinic keratoses will progress to squamous cell carcinoma of the skin. It is not possible to reliably predict which spots will progress to skin cancer and so treatment is recommended to prevent development of skin cancer.  Recommend daily broad spectrum sunscreen SPF 30+ to sun-exposed areas, reapply every 2 hours as needed.  Recommend staying in the shade or wearing long sleeves, sun glasses (UVA+UVB protection) and wide brim hats (4-inch brim around the entire circumference of the hat). Call for new or changing lesions.  Destruction of lesion - left medial cheek x 1 Complexity: simple   Destruction method: cryotherapy   Informed consent: discussed and consent obtained   Timeout:  patient name, date of birth, surgical site, and procedure verified Lesion destroyed using liquid nitrogen: Yes   Region frozen until ice ball extended beyond lesion: Yes   Outcome: patient tolerated procedure well with no complications   Post-procedure details: wound care instructions given    Inflamed seborrheic keratosis left cheek x 4, right cheek x 1  Destruction of lesion - left cheek  x 4, right cheek x 1 Complexity: simple   Destruction method: cryotherapy   Informed consent: discussed and consent obtained   Timeout:  patient name, date of birth, surgical site, and procedure verified Lesion destroyed using liquid nitrogen: Yes   Region frozen until ice ball extended beyond lesion: Yes   Outcome: patient tolerated procedure well with no complications    Post-procedure details: wound care instructions given    History of Basal Cell Carcinoma of the Skin - No evidence of recurrence today - Recommend regular full body skin exams - Recommend daily broad spectrum sunscreen SPF 30+ to sun-exposed areas, reapply every 2 hours as needed.  - Call if any new or changing lesions are noted between office visits  Actinic Damage - chronic, secondary to cumulative UV radiation exposure/sun exposure over time - diffuse scaly erythematous macules with underlying dyspigmentation - Recommend daily broad spectrum sunscreen SPF 30+ to sun-exposed areas, reapply every 2 hours as needed.  - Recommend staying in the shade or wearing long sleeves, sun glasses (UVA+UVB protection) and wide brim hats (4-inch brim around the entire circumference of the hat). - Call for new or changing lesions.  Return for 2 - 3 month recheck scar at left medial cheek . IRuthell Rummage, CMA, am acting as scribe for Sarina Ser, MD. Documentation: I have reviewed the above documentation for accuracy and completeness, and I agree with the above.  Sarina Ser, MD

## 2020-11-16 NOTE — Patient Instructions (Addendum)
Actinic keratoses are precancerous spots that appear secondary to cumulative UV radiation exposure/sun exposure over time. They are chronic with expected duration over 1 year. A portion of actinic keratoses will progress to squamous cell carcinoma of the skin. It is not possible to reliably predict which spots will progress to skin cancer and so treatment is recommended to prevent development of skin cancer.  Recommend daily broad spectrum sunscreen SPF 30+ to sun-exposed areas, reapply every 2 hours as needed.  Recommend staying in the shade or wearing long sleeves, sun glasses (UVA+UVB protection) and wide brim hats (4-inch brim around the entire circumference of the hat). Call for new or changing lesions.   Cryotherapy Aftercare  Wash gently with soap and water everyday.   Apply Vaseline and Band-Aid daily until healed.   Seborrheic Keratosis  What causes seborrheic keratoses? Seborrheic keratoses are harmless, common skin growths that first appear during adult life.  As time goes by, more growths appear.  Some people may develop a large number of them.  Seborrheic keratoses appear on both covered and uncovered body parts.  They are not caused by sunlight.  The tendency to develop seborrheic keratoses can be inherited.  They vary in color from skin-colored to gray, brown, or even black.  They can be either smooth or have a rough, warty surface.   Seborrheic keratoses are superficial and look as if they were stuck on the skin.  Under the microscope this type of keratosis looks like layers upon layers of skin.  That is why at times the top layer may seem to fall off, but the rest of the growth remains and re-grows.    Treatment Seborrheic keratoses do not need to be treated, but can easily be removed in the office.  Seborrheic keratoses often cause symptoms when they rub on clothing or jewelry.  Lesions can be in the way of shaving.  If they become inflamed, they can cause itching, soreness, or  burning.  Removal of a seborrheic keratosis can be accomplished by freezing, burning, or surgery. If any spot bleeds, scabs, or grows rapidly, please return to have it checked, as these can be an indication of a skin cancer.   If you have any questions or concerns for your doctor, please call our main line at [phone] and press option 4 to reach your doctor's medical assistant. If no one answers, please leave a voicemail as directed and we will return your call as soon as possible. Messages left after 4 pm will be answered the following business day.   You may also send [place] a message via [place]. We typically respond to MyChart messages within 1-2 business days.  For prescription refills, please ask your pharmacy to contact our office. Our fax number is [phone].  If you have an urgent issue when the clinic is closed that cannot wait until the next business day, you can page your doctor at the number below.    Please note that while we do our best to be available for urgent issues outside of office hours, we are not available 24/7.   If you have an urgent issue and are unable to reach [place], you may choose to seek medical care at your doctor's office, retail clinic, urgent care center, or emergency room.  If you have a medical emergency, please immediately call 911 or go to the emergency department.  Pager Numbers  - Dr. Nehemiah Massed: [phone]  - Dr. Laurence Ferrari: [phone]  - Dr. Nicole Kindred: [phone]  In the  event of inclement weather, please call our main line at [phone] for an update on the status of any delays or closures.  Dermatology Medication Tips: Please keep the boxes that topical medications come in in order to help keep track of the instructions about where and how to use these. Pharmacies typically print the medication instructions only on the boxes and not directly on the medication tubes.   If your medication is too expensive, please contact our office at  [phone] option 4 or send [place] a message through [place].   We are unable to tell what your co-pay for medications will be in advance as this is different depending on your insurance coverage. However, we may be able to find a substitute medication at lower cost or fill out paperwork to get insurance to cover a needed medication.   If a prior authorization is required to get your medication covered by your insurance company, please allow [place] 1-2 business days to complete this process.  Drug prices often vary depending on where the prescription is filled and some pharmacies may offer cheaper prices.  The website www.goodrx.com contains coupons for medications through different pharmacies. The prices here do not account for what the cost may be with help from insurance (it may be cheaper with your insurance), but the website can give you the price if you did not use any insurance.  - You can print the associated coupon and take it with your prescription to the pharmacy.  - You may also stop by our office during regular business hours and pick up a GoodRx coupon card.  - If you need your prescription sent electronically to a different pharmacy, notify our office through [facility] or by phone at [phone] option 4.

## 2020-11-17 ENCOUNTER — Encounter: Payer: Self-pay | Admitting: Dermatology

## 2020-11-20 ENCOUNTER — Telehealth: Payer: Self-pay | Admitting: Pulmonary Disease

## 2020-11-20 ENCOUNTER — Telehealth: Payer: Self-pay

## 2020-11-20 ENCOUNTER — Other Ambulatory Visit: Payer: Self-pay

## 2020-11-20 ENCOUNTER — Other Ambulatory Visit: Payer: Self-pay | Admitting: Acute Care

## 2020-11-20 MED ORDER — TRELEGY ELLIPTA 100-62.5-25 MCG/INH IN AEPB: INHALATION_SPRAY | RESPIRATORY_TRACT | 2 refills | Status: DC

## 2020-11-20 NOTE — Telephone Encounter (Signed)
Spoke with pt who states she called office on Monday to request Trelegy refill and was told she needed to schedule OV which she did. For unknown reason refill not sent. Pt is calling back today to request same refill be sent. RN verified pharmacy and sent refill. Pt stated understanding. Nothing further needed at this time.

## 2020-11-20 NOTE — Telephone Encounter (Signed)
Error

## 2020-12-10 ENCOUNTER — Other Ambulatory Visit: Payer: Self-pay

## 2020-12-10 ENCOUNTER — Ambulatory Visit: Payer: Medicare HMO | Admitting: Pulmonary Disease

## 2020-12-10 ENCOUNTER — Encounter: Payer: Self-pay | Admitting: Pulmonary Disease

## 2020-12-10 VITALS — BP 124/60 | HR 90 | Temp 98.0°F | Ht 66.0 in | Wt 243.4 lb

## 2020-12-10 DIAGNOSIS — J449 Chronic obstructive pulmonary disease, unspecified: Secondary | ICD-10-CM | POA: Diagnosis not present

## 2020-12-10 DIAGNOSIS — H5212 Myopia, left eye: Secondary | ICD-10-CM | POA: Diagnosis not present

## 2020-12-10 DIAGNOSIS — R0609 Other forms of dyspnea: Secondary | ICD-10-CM

## 2020-12-10 DIAGNOSIS — Z9841 Cataract extraction status, right eye: Secondary | ICD-10-CM | POA: Diagnosis not present

## 2020-12-10 DIAGNOSIS — Z9842 Cataract extraction status, left eye: Secondary | ICD-10-CM | POA: Diagnosis not present

## 2020-12-10 DIAGNOSIS — Z902 Acquired absence of lung [part of]: Secondary | ICD-10-CM

## 2020-12-10 DIAGNOSIS — R5381 Other malaise: Secondary | ICD-10-CM | POA: Diagnosis not present

## 2020-12-10 MED ORDER — BREZTRI AEROSPHERE 160-9-4.8 MCG/ACT IN AERO: 2.0000 | INHALATION_SPRAY | Freq: Two times a day (BID) | RESPIRATORY_TRACT | 0 refills | Status: DC

## 2020-12-10 MED ORDER — BREZTRI AEROSPHERE 160-9-4.8 MCG/ACT IN AERO
2.0000 | INHALATION_SPRAY | Freq: Two times a day (BID) | RESPIRATORY_TRACT | 0 refills | Status: DC
Start: 2020-12-10 — End: 2021-03-26

## 2020-12-10 NOTE — Progress Notes (Signed)
Synopsis: Referred in January 2020 for right upper lobe PET avid lung nodule by Jinny Sanders, MD  Subjective:   PATIENT ID: Hannah Green GENDER: female DOB: June 06, 1945, MRN: [id number]  Chief Complaint  Patient presents with   Follow-up    This is a 75 year old female with a past medical history of breast cancer diagnosed in 1999 status post resection and radiation.  Patient has a longstanding history of smoking, 50 pack years.  2 years ago the patient was enrolled in a lung cancer screening program.  Her first screening revealed a small right upper lobe nodule.  She ultimately had one-year follow-up screening that was completed in January 2020.  This revealed enlargement of the upper lobe nodule that is subpleural in nature.  She underwent a PET scan which revealed an SUV of 7.  Patient was referred to me to discuss biopsy or obtaining tissue sampling due to concern for malignancy.  Patient today has no complaints.  She recently had pulmonary function test completed which revealed an FEV1 postbronchodilator of 1.43 L which is 90% predicted.  Her DLCO was 143%.  She currently uses inhalers as needed for shortness of breath and wheezing.  Of note there was two-vessel calcified coronary disease noted on her LD CT.  She does have gastroesophageal reflux and colon polyps.  She is scheduled for an elective EGD colonoscopy for next week.  OV 12/03/2018: Since our last office visit patient was admitted in March 2020 for a lobectomy by Dr. 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  Last seen by Dr. Roxan Hockey in the office in May 2020.  She underwent a right upper and middle bilobectomy for a stage Ib squamous cell carcinoma.  Her course was complicated by prolonged air leak, postop atrial fibrillation, pneumonia and cellulitis at the chest tube insertion site.  Today, she is concerned that she has not fully recovered from her surgery.  She feels a little bit  depressed about this.  Her dyspnea on exertion is still there.  She occasionally has some pains in the right side of her chest and rib spaces.  She points to the locations in which she had chest tube insertions in the past.  Overall doing somewhat better.  Using her inhalers regularly.  Unfortunately she has been rather sedentary during this whole time.  She has not returned to her previous activity level.  She has not been back to walking.  I encouraged her to return to her previous athletic ability of least walking during each day.  OV 12/10/2020: here today for follow up post surgery and copd, management currently doing ok on trelegy.  Unfortunately she feels like she has not ever really recovered post surgery.  She feels like she is short of breath wheezing still.  Does not feel like the Trelegy helps as much.  She also noted that she has gained weight.  She has been less active.     Past Medical History:  Diagnosis Date   Breast cancer [facility]) 1999   Cataract 2017   resolved with surgery   COPD (chronic obstructive pulmonary disease) ([place])    Coronary artery disease    Dysplastic nevus 09/04/2012   R prox calf - mild    GERD (gastroesophageal reflux disease)    Hyperlipidemia    Postoperative atrial fibrillation ([place]) 03/2018   Rheumatoid arthritis(714.0)    Squamous cell carcinoma of lung, stage I, right ([place]) 2020  Status post right upper and middle lobectomy     Family History  Problem Relation Age of Onset   Cancer Mother        breast   Cancer Father        lung cancer   Cancer Sister        throat cancer   Diabetes Paternal Uncle    Diabetes Maternal Grandmother      Past Surgical History:  Procedure Laterality Date   BREAST LUMPECTOMY  1999   left, needed radiation   CATARACT EXTRACTION W/ INTRAOCULAR LENS  IMPLANT, BILATERAL Bilateral 05/19/15, 06/02/15   CHEST TUBE INSERTION Right 04/23/2018   Procedure: Right Lateral Chest Tube Insertion;  Surgeon: Melrose Nakayama, MD;  Location: [facility];  Service: Thoracic;  Laterality: Right;   CHOLECYSTECTOMY     LOBECTOMY Right 04/23/2018   Procedure: RIGHT UPPER LOBECTOMY;  Surgeon: Melrose Nakayama, MD;  Location: [place];  Service: Thoracic;  Laterality: Right;   NM MYOVIEW LTD  01/2016   LOW RISK. Hypertensive response to exercise followed by significant drop (stage I pressure 211/70 drop to 118/75. Stage II). Apical artifact but otherwise no ischemia or infarction. EF 64%.   NODE DISSECTION Right 04/23/2018   Procedure: MEDIASTINAL NODE DISSECTION;  Surgeon: Melrose Nakayama, MD;  Location: [facility] OR;  Service: Thoracic;  Laterality: Right;   RIGHT/LEFT HEART CATH AND CORONARY ANGIOGRAPHY N/A 04/10/2018   Procedure: RIGHT/LEFT HEART CATH AND CORONARY ANGIOGRAPHY;  Surgeon: Leonie Man, MD;  Location: [facility];; angiographically minimal coronary artery disease.  Normal LVEDP.  Basely normal RHC pressures with no suggestion of pulmonary hypertension.  Normal CO-CI by Fick (5.9-2.8)   TONSILLECTOMY     TRANSTHORACIC ECHOCARDIOGRAM  11/'17; 2/'20   a) Nov 2017: Mild LVH. EF 60-65%. GR 2-DD; b) Feb 2020: Normal LV size and function.  EF 60 to 65%.  No R WMA.  GR 1 DD.  Normal RV size and function.  Normal pressures.  No valvular disease.   VIDEO ASSISTED THORACOSCOPY (VATS)/WEDGE RESECTION Right 04/23/2018   Procedure: VIDEO ASSISTED THORACOSCOPY (VATS)/WEDGE RESECTION OF RIGHT UPPER LUNG;  Surgeon: Melrose Nakayama, MD;  Location: [facility] OR;  Service: Thoracic;  Laterality: Right;    Social History   Socioeconomic History   Marital status: Divorced    Spouse name: Not on file   Number of children: 2   Years of education: Not on file   Highest education level: Not on file  Occupational History   Occupation: Retired Freight forwarder    Comment: Veterinary surgeon  Tobacco Use   Smoking status: Former    Packs/day: 1.50    Years: 50.00    Pack years: 75.00    Types: Cigarettes    Quit date:  04/01/2011    Years since quitting: 9.7   Smokeless tobacco: Never   Tobacco comments:    has stopped but has started again in "crisis"  Vaping Use   Vaping Use: Never used  Substance and Sexual Activity   Alcohol use: Yes    Alcohol/week: 2.0 standard drinks    Types: 2 Glasses of wine per week    Comment: monthly   Drug use: No   Sexual activity: Never  Other Topics Concern   Not on file  Social History Narrative   Lives alone   No living will   Requests friend Porfirio Mylar to make health care decisions.   Would accept resuscitation but no prolonged ventilation  Would not want tube feeds if cognitively unaware   Social Determinants of Health   Financial Resource Strain: Low Risk    Difficulty of Paying Living Expenses: Not hard at all  Food Insecurity: No Food Insecurity   Worried About Charity fundraiser in the Last Year: Never true   [place] in the Last Year: Never true  Transportation Needs: No Transportation Needs   Lack of Transportation (Medical): No   Lack of Transportation (Non-Medical): No  Physical Activity: Insufficiently Active   Days of Exercise per Week: 2 days   Minutes of Exercise per Session: 60 min  Stress: No Stress Concern Present   Feeling of Stress : Not at all  Social Connections: Not on file  Intimate Partner Violence: Not At Risk   Fear of Current or Ex-Partner: No   Emotionally Abused: No   Physically Abused: No   Sexually Abused: No     Allergies  Allergen Reactions   Adhesive [Tape] Rash    Paper tape causes rash   Neosporin [Neomycin-Bacitracin Zn-Polymyx] Itching   Penicillins Rash    Did it involve swelling of the face/tongue/throat, SOB, or low BP? No Did it involve sudden or severe rash/hives, skin peeling, or any reaction on the inside of your mouth or nose? Yes Did you need to seek medical attention at a hospital or doctor's office? Yes When did it last happen? From childhood If all above answers are "NO", may  proceed with cephalosporin use.      Outpatient Medications Prior to Visit  Medication Sig Dispense Refill   albuterol (VENTOLIN HFA) 108 (90 Base) MCG/ACT inhaler TAKE 2 PUFFS BY MOUTH EVERY 6 HOURS AS NEEDED FOR WHEEZE OR SHORTNESS OF BREATH 8.5 g 3   aspirin EC 81 MG tablet Take 1 tablet (81 mg total) by mouth daily. 90 tablet 3   atorvastatin (LIPITOR) 10 MG tablet TAKE 1 TABLET BY MOUTH EVERY DAY 90 tablet 0   cholecalciferol (VITAMIN D3) 25 MCG (1000 UT) tablet Take 1,000 Units by mouth daily.     Fluticasone-Umeclidin-Vilant (TRELEGY ELLIPTA) 100-62.5-25 MCG/INH AEPB TAKE 1 PUFF BY MOUTH EVERY DAY 60 each 2   No facility-administered medications prior to visit.    Review of Systems  Constitutional:  Negative for chills, fever, malaise/fatigue and weight loss.  HENT:  Negative for hearing loss, sore throat and tinnitus.   Eyes:  Negative for blurred vision and double vision.  Respiratory:  Positive for shortness of breath. Negative for cough, hemoptysis, sputum production, wheezing and stridor.   Cardiovascular:  Negative for chest pain, palpitations, orthopnea, leg swelling and PND.  Gastrointestinal:  Negative for abdominal pain, constipation, diarrhea, heartburn, nausea and vomiting.  Genitourinary:  Negative for dysuria, hematuria and urgency.  Musculoskeletal:  Negative for joint pain and myalgias.  Skin:  Negative for itching and rash.  Neurological:  Negative for dizziness, tingling, weakness and headaches.  Endo/Heme/Allergies:  Negative for environmental allergies. Does not bruise/bleed easily.  Psychiatric/Behavioral:  Negative for depression. The patient is not nervous/anxious and does not have insomnia.   All other systems reviewed and are negative.   Objective:  Physical Exam Vitals reviewed.  Constitutional:      General: She is not in acute distress.    Appearance: She is well-developed. She is obese.  HENT:     Head: Normocephalic and atraumatic.  Eyes:      General: No scleral icterus.    Conjunctiva/sclera: Conjunctivae normal.  Pupils: Pupils are equal, round, and reactive to light.  Neck:     Vascular: No JVD.     Trachea: No tracheal deviation.  Cardiovascular:     Rate and Rhythm: Normal rate and regular rhythm.     Heart sounds: Normal heart sounds. No murmur heard. Pulmonary:     Effort: Pulmonary effort is normal. No tachypnea, accessory muscle usage or respiratory distress.     Breath sounds: Normal breath sounds. No stridor. No wheezing, rhonchi or rales.  Abdominal:     General: Bowel sounds are normal. There is no distension.     Palpations: Abdomen is soft.     Tenderness: There is no abdominal tenderness.  Musculoskeletal:        General: No tenderness.     Cervical back: Neck supple.  Lymphadenopathy:     Cervical: No cervical adenopathy.  Skin:    General: Skin is warm and dry.     Capillary Refill: Capillary refill takes less than 2 seconds.     Findings: No rash.  Neurological:     Mental Status: She is alert and oriented to person, place, and time.  Psychiatric:        Behavior: Behavior normal.     Vitals:   12/10/20 1451  BP: 124/60  Pulse: 90  Temp: 98 F (36.7 C)  TempSrc: Oral  SpO2: 97%  Weight: 243 lb 6.4 oz (110.4 kg)  Height: 5\' 6"  (1.676 m)   97% on RA BMI Readings from Last 3 Encounters:  12/10/20 39.29 kg/m  08/17/20 38.68 kg/m  07/15/20 38.95 kg/m   Wt Readings from Last 3 Encounters:  12/10/20 243 lb 6.4 oz (110.4 kg)  08/17/20 236 lb (107 kg)  07/15/20 237 lb 11.2 oz (107.8 kg)     CBC    Component Value Date/Time   WBC 8.3 07/13/2020 0939   WBC 8.3 02/18/2020 1628   RBC 4.75 07/13/2020 0939   HGB 14.7 07/13/2020 0939   HGB 15.2 04/04/2018 1643   HCT 43.9 07/13/2020 0939   HCT 45.5 04/04/2018 1643   PLT 320 07/13/2020 0939   PLT 406 04/04/2018 1643   MCV 92.4 07/13/2020 0939   MCV 91 04/04/2018 1643   MCH 30.9 07/13/2020 0939   MCHC 33.5 07/13/2020 0939    RDW 13.0 07/13/2020 0939   RDW 12.8 04/04/2018 1643   LYMPHSABS 2.4 07/13/2020 0939   MONOABS 0.6 07/13/2020 0939   EOSABS 0.2 07/13/2020 0939   BASOSABS 0.1 07/13/2020 0939    Chest Imaging: Lung cancer screening images 02/01/2017, 03/01/2018: Right upper lobe lung nodule increased in size from 2018- thousand 20  03/09/2018 nuclear medicine pet imaging: PET avid right upper lobe nodule, SUV 7.  Pulmonary Functions Testing Results: PFT Results Latest Ref Rng & Units 12/03/2018 03/06/2018  FVC-Pre L 2.04 2.11  FVC-Predicted Pre % 63 65  FVC-Post L 2.28 2.36  FVC-Predicted Post % 71 73  Pre FEV1/FVC % % 53 59  Post FEV1/FCV % % 54 60  FEV1-Pre L 1.09 1.25  FEV1-Predicted Pre % 44 50  FEV1-Post L 1.22 1.43  DLCO uncorrected ml/min/mmHg 17.94 21.31  DLCO UNC% % 85 101  DLVA Predicted % 108 125  TLC L 4.90 5.75  TLC % Predicted % 90 105  RV % Predicted % 109 144    FeNO: None   Pathology: None   Echocardiogram:   Study Conclusions   - Left ventricle: The cavity size was normal. Wall thickness was  increased in a pattern of mild LVH. Systolic function was normal.   The estimated ejection fraction was in the range of 60% to 65%.   Wall motion was normal; there were no regional wall motion   abnormalities. Features are consistent with a pseudonormal left   ventricular filling pattern, with concomitant abnormal relaxation   and increased filling pressure (grade 2 diastolic dysfunction).   Heart Catheterization: None     Assessment & Plan:   Stage 3 severe COPD by GOLD classification (HCC)  Dyspnea on exertion  Physical deconditioning  S/P Right Upper Lobectomy, Right Middle Lobectomy Lung  Discussion:  This is a 75 year old female, severe COPD FEV1 50% predicted, postbronchodilator response, DLCO 85%.  Relatively unchanged from her preop PFTs slightly reduced 1.4 L to 1.2 L.  She still has some dyspnea on exertion and shortness of breath.  Plan: Continue triple  therapy inhaler Switch to [employer]. Encouraged her to get back to her activities She needs to lose weight. She also seems somewhat depressed about loss of her functional status.  She is concerned about this. I think a lot of it is related to her being an active over the past year post surgery. We will also switch her inhaler to see if this helps going from a DPI to an MDI.    Current Outpatient Medications:    albuterol (VENTOLIN HFA) 108 (90 Base) MCG/ACT inhaler, TAKE 2 PUFFS BY MOUTH EVERY 6 HOURS AS NEEDED FOR WHEEZE OR SHORTNESS OF BREATH, Disp: 8.5 g, Rfl: 3   aspirin EC 81 MG tablet, Take 1 tablet (81 mg total) by mouth daily., Disp: 90 tablet, Rfl: 3   atorvastatin (LIPITOR) 10 MG tablet, TAKE 1 TABLET BY MOUTH EVERY DAY, Disp: 90 tablet, Rfl: 0   Budeson-Glycopyrrol-Formoterol (BREZTRI AEROSPHERE) 160-9-4.8 MCG/ACT AERO, Inhale 2 puffs into the lungs in the morning and at bedtime., Disp: 10.7 g, Rfl: 0   Budeson-Glycopyrrol-Formoterol (BREZTRI AEROSPHERE) 160-9-4.8 MCG/ACT AERO, Inhale 2 puffs into the lungs in the morning and at bedtime., Disp: 10.7 g, Rfl: 0   cholecalciferol (VITAMIN D3) 25 MCG (1000 UT) tablet, Take 1,000 Units by mouth daily., Disp: , Rfl:    Fluticasone-Umeclidin-Vilant (TRELEGY ELLIPTA) 100-62.5-25 MCG/INH AEPB, TAKE 1 PUFF BY MOUTH EVERY DAY, Disp: 60 each, Rfl: 2   Garner Nash, DO [facility] 12/10/2020 6:48 PM

## 2020-12-10 NOTE — Patient Instructions (Addendum)
Thank you for visiting Dr. Valeta Harms at [facility]. Today we recommend the following:  Breztri samples today and new prescription  Albuterol continued as needed   Return in about 3 months (around 03/12/2021) for with APP or Dr. Valeta Harms.    Please do your part to reduce the spread of COVID-19.

## 2021-02-16 ENCOUNTER — Ambulatory Visit: Payer: Medicare HMO

## 2021-03-12 ENCOUNTER — Ambulatory Visit: Payer: Medicare HMO | Admitting: Pulmonary Disease

## 2021-03-15 ENCOUNTER — Ambulatory Visit: Payer: Medicare HMO | Admitting: Dermatology

## 2021-03-19 ENCOUNTER — Ambulatory Visit
Admission: EM | Admit: 2021-03-19 | Discharge: 2021-03-19 | Disposition: A | Discharge status: Home / Self Care | Payer: Medicare HMO | Attending: Internal Medicine | Admitting: Internal Medicine

## 2021-03-19 ENCOUNTER — Encounter: Payer: Self-pay | Admitting: Emergency Medicine

## 2021-03-19 ENCOUNTER — Other Ambulatory Visit: Payer: Self-pay

## 2021-03-19 DIAGNOSIS — U071 COVID-19: Secondary | ICD-10-CM | POA: Diagnosis not present

## 2021-03-19 DIAGNOSIS — H9202 Otalgia, left ear: Secondary | ICD-10-CM

## 2021-03-19 MED ORDER — GUAIFENESIN ER 600 MG PO TB12: 600.0000 mg | ORAL_TABLET | Freq: Two times a day (BID) | ORAL | 0 refills | Status: AC

## 2021-03-19 MED ORDER — FLUTICASONE PROPIONATE 50 MCG/ACT NA SUSP: 1.0000 | Freq: Every day | NASAL | 0 refills | Status: DC

## 2021-03-19 MED ORDER — BENZONATATE 100 MG PO CAPS: 100.0000 mg | ORAL_CAPSULE | Freq: Three times a day (TID) | ORAL | 0 refills | Status: DC | PRN

## 2021-03-19 MED ORDER — SALINE SPRAY 0.65 % NA SOLN: 1.0000 | NASAL | 0 refills | Status: DC | PRN

## 2021-03-19 NOTE — ED Provider Notes (Signed)
[name]    CSN: [id number] Arrival date & time: 03/19/21  1027      History   Chief Complaint Chief Complaint  Patient presents with   Nasal Congestion   Otalgia    HPI Hannah Green is a 76 y.o. female comes to urgent care with 5-day history of congestion, sinus pressure, facial pain severe nasal bilateral ear pain.  Patient was diagnosed 5 days ago for COVID-19 infection using the home COVID test.  She initially had greenish sputum and rhinorrhea.  Patient also had a cough.  Patient's symptoms have improved.  Nasal discharge and sputum are clear.  Patient is now experiencing left ear pain.  She continues to have facial pain, nasal congestion and postnasal drainage.  No ringing out of the left ear.  No ear discharge.  No dizziness.  Patient is tolerating oral intake.  No abdominal pain or diarrhea.Marland Kitchen   HPI  Past Medical History:  Diagnosis Date   Breast cancer ([place]) 1999   Cataract 2017   resolved with surgery   COPD (chronic obstructive pulmonary disease) ([place])    Coronary artery disease    Dysplastic nevus 09/04/2012   R prox calf - mild    GERD (gastroesophageal reflux disease)    Hyperlipidemia    Postoperative atrial fibrillation ([place]) 03/2018   Rheumatoid arthritis(714.0)    Squamous cell carcinoma of lung, stage I, right ([place]) 2020   Status post right upper and middle lobectomy    Patient Active Problem List   Diagnosis Date Noted   Tremor 02/18/2020   Skin lesion of cheek 02/18/2020   Left-sided chest wall pain 08/29/2019   Pain and swelling of right lower leg 08/29/2019   Acute neck pain 12/25/2018   Goals of care, counseling/discussion 06/12/2018   Stage I squamous cell carcinoma of right lung ([place]) 06/12/2018   HCAP (healthcare-associated pneumonia) 05/08/2018   History of lung cancer 04/24/2018   S/P Right Upper Lobectomy, Right Middle Lobectomy Lung 04/23/2018   Pain and swelling of wrist, right 04/18/2018   Exertional chest pain  04/04/2018   Pulmonary nodule 03/05/2018   Prediabetes 01/08/2018   COPD exacerbation ([place]) 04/27/2017   COPD (chronic obstructive pulmonary disease) ([place]) 59/16/3846   Diastolic dysfunction without heart failure 02/01/2016   Osteopenia of femoral neck 01/12/2016   Rosacea 12/22/2015   Constipation, chronic 11/11/2014   Basal cell carcinoma of left side of nose 06/01/2012   Routine general medical examination at a health care facility 06/02/2011   GERD (gastroesophageal reflux disease)    Vitamin D deficiency 02/13/2008   ADENOCARCINOMA, BREAST 02/08/2008   Hyperlipidemia with target LDL less than 100 02/08/2008   Rheumatoid arthritis(714.0) 02/08/2008    Past Surgical History:  Procedure Laterality Date   BREAST LUMPECTOMY  1999   left, needed radiation   CATARACT EXTRACTION W/ INTRAOCULAR LENS  IMPLANT, BILATERAL Bilateral 05/19/15, 06/02/15   CHEST TUBE INSERTION Right 04/23/2018   Procedure: Right Lateral Chest Tube Insertion;  Surgeon: Melrose Nakayama, MD;  Location: [facility] OR;  Service: Thoracic;  Laterality: Right;   CHOLECYSTECTOMY     LOBECTOMY Right 04/23/2018   Procedure: RIGHT UPPER LOBECTOMY;  Surgeon: Melrose Nakayama, MD;  Location: [facility] OR;  Service: Thoracic;  Laterality: Right;   NM MYOVIEW LTD  01/2016   LOW RISK. Hypertensive response to exercise followed by significant drop (stage I pressure 211/70 drop to 118/75. Stage II). Apical artifact but otherwise no ischemia or infarction. EF 64%.   NODE  DISSECTION Right 04/23/2018   Procedure: MEDIASTINAL NODE DISSECTION;  Surgeon: Melrose Nakayama, MD;  Location: [facility] OR;  Service: Thoracic;  Laterality: Right;   RIGHT/LEFT HEART CATH AND CORONARY ANGIOGRAPHY N/A 04/10/2018   Procedure: RIGHT/LEFT HEART CATH AND CORONARY ANGIOGRAPHY;  Surgeon: Leonie Man, MD;  Location: [facility];; angiographically minimal coronary artery disease.  Normal LVEDP.  Basely normal RHC pressures with no suggestion of  pulmonary hypertension.  Normal CO-CI by Fick (5.9-2.8)   TONSILLECTOMY     TRANSTHORACIC ECHOCARDIOGRAM  11/'17; 2/'20   a) Nov 2017: Mild LVH. EF 60-65%. GR 2-DD; b) Feb 2020: Normal LV size and function.  EF 60 to 65%.  No R WMA.  GR 1 DD.  Normal RV size and function.  Normal pressures.  No valvular disease.   VIDEO ASSISTED THORACOSCOPY (VATS)/WEDGE RESECTION Right 04/23/2018   Procedure: VIDEO ASSISTED THORACOSCOPY (VATS)/WEDGE RESECTION OF RIGHT UPPER LUNG;  Surgeon: Melrose Nakayama, MD;  Location: [facility];  Service: Thoracic;  Laterality: Right;    OB History   No obstetric history on file.      Home Medications    Prior to Admission medications   Medication Sig Start Date End Date Taking? Authorizing Provider  benzonatate (TESSALON) 100 MG capsule Take 1 capsule (100 mg total) by mouth 3 (three) times daily as needed for cough. 03/19/21  Yes Saryna Kneeland, Myrene Galas, MD  fluticasone (FLONASE) 50 MCG/ACT nasal spray Place 1 spray into both nostrils daily. 03/19/21  Yes Ginnette Gates, Myrene Galas, MD  guaiFENesin (MUCINEX) 600 MG 12 hr tablet Take 1 tablet (600 mg total) by mouth 2 (two) times daily for 10 days. 03/19/21 03/29/21 Yes Jared Whorley, Myrene Galas, MD  sodium chloride (OCEAN) 0.65 % SOLN nasal spray Place 1 spray into both nostrils as needed for congestion. 03/19/21  Yes Savaughn Karwowski, Myrene Galas, MD  albuterol (VENTOLIN HFA) 108 (90 Base) MCG/ACT inhaler TAKE 2 PUFFS BY MOUTH EVERY 6 HOURS AS NEEDED FOR WHEEZE OR SHORTNESS OF BREATH 09/16/19   Magdalen Spatz, NP  aspirin EC 81 MG tablet Take 1 tablet (81 mg total) by mouth daily. 04/04/18   Leonie Man, MD  atorvastatin (LIPITOR) 10 MG tablet TAKE 1 TABLET BY MOUTH EVERY DAY 12/09/19   Bedsole, Amy E, MD  Budeson-Glycopyrrol-Formoterol (BREZTRI AEROSPHERE) 160-9-4.8 MCG/ACT AERO Inhale 2 puffs into the lungs in the morning and at bedtime. 12/10/20   Icard, Octavio Graves, DO  Budeson-Glycopyrrol-Formoterol (BREZTRI AEROSPHERE) 160-9-4.8 MCG/ACT AERO Inhale  2 puffs into the lungs in the morning and at bedtime. 12/10/20   Garner Nash, DO  cholecalciferol (VITAMIN D3) 25 MCG (1000 UT) tablet Take 1,000 Units by mouth daily.    [provider]  Fluticasone-Umeclidin-Vilant (TRELEGY ELLIPTA) 100-62.5-25 MCG/INH AEPB TAKE 1 PUFF BY MOUTH EVERY DAY 11/20/20   Magdalen Spatz, NP    Family History Family History  Problem Relation Age of Onset   Cancer Mother        breast   Cancer Father        lung cancer   Cancer Sister        throat cancer   Diabetes Paternal Uncle    Diabetes Maternal Grandmother     Social History Social History   Tobacco Use   Smoking status: Former    Packs/day: 1.50    Years: 50.00    Pack years: 75.00    Types: Cigarettes    Quit date: 04/01/2011    Years since quitting:  9.9   Smokeless tobacco: Never   Tobacco comments:    has stopped but has started again in "crisis"  Vaping Use   Vaping Use: Never used  Substance Use Topics   Alcohol use: Yes    Alcohol/week: 2.0 standard drinks    Types: 2 Glasses of wine per week    Comment: monthly   Drug use: No     Allergies   Adhesive [tape], Neosporin [neomycin-bacitracin zn-polymyx], and Penicillins   Review of Systems Review of Systems  Respiratory:  Negative for chest tightness, shortness of breath and wheezing.   Cardiovascular: Negative.   Gastrointestinal: Negative.     Physical Exam Triage Vital Signs ED Triage Vitals  Enc Vitals Group     BP 03/19/21 1039 (!) 143/82     Pulse Rate 03/19/21 1039 71     Resp 03/19/21 1039 18     Temp 03/19/21 1039 98.1 F (36.7 C)     Temp Source 03/19/21 1039 Oral     SpO2 03/19/21 1039 95 %     Weight --      Height --      Head Circumference --      Peak Flow --      Pain Score 03/19/21 1032 6     Pain Loc --      Pain Edu? --      Excl. in [place]? --    No data found.  Updated Vital Signs BP (!) 143/82 (BP Location: Left Arm)    Pulse 71    Temp 98.1 F (36.7 C) (Oral)    Resp 18     LMP  (LMP Unknown)    SpO2 95%   Visual Acuity Right Eye Distance:   Left Eye Distance:   Bilateral Distance:    Right Eye Near:   Left Eye Near:    Bilateral Near:     Physical Exam Vitals and nursing note reviewed.  Constitutional:      General: She is not in acute distress.    Appearance: She is not ill-appearing.  HENT:     Right Ear: Tympanic membrane normal. There is no impacted cerumen.     Left Ear: Tympanic membrane normal. There is no impacted cerumen.     Nose: Congestion present.     Mouth/Throat:     Mouth: Mucous membranes are moist.     Pharynx: No posterior oropharyngeal erythema.  Cardiovascular:     Rate and Rhythm: Normal rate and regular rhythm.     Pulses: Normal pulses.     Heart sounds: Normal heart sounds.  Pulmonary:     Effort: Pulmonary effort is normal.     Breath sounds: Normal breath sounds.  Neurological:     Mental Status: She is alert.     UC Treatments / Results  Labs (all labs ordered are listed, but only abnormal results are displayed) Labs Reviewed - No data to display  EKG   Radiology No results found.  Procedures Procedures (including critical care time)  Medications Ordered in UC Medications - No data to display  Initial Impression / Assessment and Plan / UC Course  I have reviewed the triage vital signs and the nursing notes.  Pertinent labs & imaging results that were available during my care of the patient were reviewed by me and considered in my medical decision making (see chart for details).     1.  Acute viral sinusitis with left ear pain: Ear pain is likely  referred pain No antibiotics indicated at this time Patient is not a candidate for antivirals for treatment of COVID infection Flonase for nasal congestion Salt water nasal spray as recommended Vicks chest rub and humidifier use will help with nasal congestion and postnasal drainage Mucinex as needed Return precautions given. Final Clinical  Impressions(s) / UC Diagnoses   Final diagnoses:  [id number] virus infection  Otalgia of left ear     Discharge Instructions      Maintain adequate hydration Take medications as prescribed Your symptoms have been persistent for more than 5 days since you will not benefit from antivirals If you have worsening symptoms please return to urgent care to be reevaluated   ED Prescriptions     Medication Sig Dispense Auth. Provider   benzonatate (TESSALON) 100 MG capsule Take 1 capsule (100 mg total) by mouth 3 (three) times daily as needed for cough. 21 capsule Dejay Kronk, Myrene Galas, MD   fluticasone (FLONASE) 50 MCG/ACT nasal spray Place 1 spray into both nostrils daily. 16 g Ziyonna Christner, Myrene Galas, MD   sodium chloride (OCEAN) 0.65 % SOLN nasal spray Place 1 spray into both nostrils as needed for congestion. -- Chase Picket, MD   guaiFENesin (MUCINEX) 600 MG 12 hr tablet Take 1 tablet (600 mg total) by mouth 2 (two) times daily for 10 days. 20 tablet Garlon Tuggle, Myrene Galas, MD      PDMP not reviewed this encounter.   Chase Picket, MD 03/19/21 1146

## 2021-03-19 NOTE — ED Triage Notes (Addendum)
Pt here COVID+ x 5 days with continuing congestion and bilateral otalgia x 3 days.

## 2021-03-19 NOTE — Discharge Instructions (Addendum)
Maintain adequate hydration Take medications as prescribed Your symptoms have been persistent for more than 5 days since you will not benefit from antivirals If you have worsening symptoms please return to urgent care to be reevaluated

## 2021-03-26 ENCOUNTER — Encounter: Payer: Self-pay | Admitting: Pulmonary Disease

## 2021-03-26 ENCOUNTER — Ambulatory Visit: Payer: Medicare HMO | Admitting: Pulmonary Disease

## 2021-03-26 ENCOUNTER — Other Ambulatory Visit: Payer: Self-pay

## 2021-03-26 VITALS — BP 146/68 | HR 70 | Temp 98.2°F | Ht 66.0 in | Wt 222.0 lb

## 2021-03-26 DIAGNOSIS — J449 Chronic obstructive pulmonary disease, unspecified: Secondary | ICD-10-CM

## 2021-03-26 DIAGNOSIS — R0609 Other forms of dyspnea: Secondary | ICD-10-CM

## 2021-03-26 DIAGNOSIS — Z902 Acquired absence of lung [part of]: Secondary | ICD-10-CM | POA: Diagnosis not present

## 2021-03-26 NOTE — Patient Instructions (Addendum)
Thank you for visiting Dr. Valeta Harms at [facility]. Today we recommend the following:  Continue trelegy  Continue albuterol  Continue to get out an exercise and weightloss plans   Return in about 6 months (around 09/23/2021) for with APP or Dr. Valeta Harms.    Please do your part to reduce the spread of COVID-19.

## 2021-03-26 NOTE — Progress Notes (Signed)
Synopsis: Referred in January 2020 for right upper lobe PET avid lung nodule by Jinny Sanders, MD  Subjective:   PATIENT ID: [place]: female DOB: [birth year], MRN: [id number]  Chief Complaint  Patient presents with   Follow-up    Patient says she is having some pain a little over from her ribs and still has shortness of breath.     This is a 76 year old female with a past medical history of breast cancer diagnosed in 1999 status post resection and radiation.  Patient has a longstanding history of smoking, 50 pack years.  2 years ago the patient was enrolled in a lung cancer screening program.  Her first screening revealed a small right upper lobe nodule.  She ultimately had one-year follow-up screening that was completed in January 2020.  This revealed enlargement of the upper lobe nodule that is subpleural in nature.  She underwent a PET scan which revealed an SUV of 7.  Patient was referred to me to discuss biopsy or obtaining tissue sampling due to concern for malignancy.  Patient today has no complaints.  She recently had pulmonary function test completed which revealed an FEV1 postbronchodilator of 1.43 L which is 90% predicted.  Her DLCO was 143%.  She currently uses inhalers as needed for shortness of breath and wheezing.  Of note there was two-vessel calcified coronary disease noted on her LD CT.  She does have gastroesophageal reflux and colon polyps.  She is scheduled for an elective EGD colonoscopy for next week.  OV 12/03/2018: Since our last office visit patient was admitted in March 2020 for a lobectomy by Dr. 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  Last seen by Dr. Roxan Hockey in the office in May 2020.  She underwent a right upper and middle bilobectomy for a stage Ib squamous cell carcinoma.  Her course was complicated by prolonged air leak, postop atrial fibrillation, pneumonia and cellulitis at the chest tube insertion site.   Today, she is concerned that she has not fully recovered from her surgery.  She feels a little bit depressed about this.  Her dyspnea on exertion is still there.  She occasionally has some pains in the right side of her chest and rib spaces.  She points to the locations in which she had chest tube insertions in the past.  Overall doing somewhat better.  Using her inhalers regularly.  Unfortunately she has been rather sedentary during this whole time.  She has not returned to her previous activity level.  She has not been back to walking.  I encouraged her to return to her previous athletic ability of least walking during each day.  OV 12/10/2020: here today for follow up post surgery and copd, management currently doing ok on trelegy.  Unfortunately she feels like she has not ever really recovered post surgery.  She feels like she is short of breath wheezing still.  Does not feel like the Trelegy helps as much.  She also noted that she has gained weight.  She has been less active.  OV 03/26/2021: Here today for COPD follow-up.  Did not like using the Breztri went back to using her Trelegy.  Uses her as needed albuterol.  She has lost about 20 pounds since she last saw me.  She is trying to get more active and get out and exercise.  From respiratory standpoint she has improved.  She still has shortness of breath with  exertion.     Past Medical History:  Diagnosis Date   Breast cancer ([place]) 1999   Cataract 2017   resolved with surgery   COPD (chronic obstructive pulmonary disease) (HCC)    Coronary artery disease    Dysplastic nevus 09/04/2012   R prox calf - mild    GERD (gastroesophageal reflux disease)    Hyperlipidemia    Postoperative atrial fibrillation ([place]) 03/2018   Rheumatoid arthritis(714.0)    Squamous cell carcinoma of lung, stage I, right ([place]) 2020   Status post right upper and middle lobectomy     Family History  Problem Relation Age of Onset   Cancer Mother        breast    Cancer Father        lung cancer   Cancer Sister        throat cancer   Diabetes Paternal Uncle    Diabetes Maternal Grandmother      Past Surgical History:  Procedure Laterality Date   BREAST LUMPECTOMY  1999   left, needed radiation   CATARACT EXTRACTION W/ INTRAOCULAR LENS  IMPLANT, BILATERAL Bilateral 05/19/15, 06/02/15   CHEST TUBE INSERTION Right 04/23/2018   Procedure: Right Lateral Chest Tube Insertion;  Surgeon: Melrose Nakayama, MD;  Location: [facility] OR;  Service: Thoracic;  Laterality: Right;   CHOLECYSTECTOMY     LOBECTOMY Right 04/23/2018   Procedure: RIGHT UPPER LOBECTOMY;  Surgeon: Melrose Nakayama, MD;  Location: [place];  Service: Thoracic;  Laterality: Right;   NM MYOVIEW LTD  01/2016   LOW RISK. Hypertensive response to exercise followed by significant drop (stage I pressure 211/70 drop to 118/75. Stage II). Apical artifact but otherwise no ischemia or infarction. EF 64%.   NODE DISSECTION Right 04/23/2018   Procedure: MEDIASTINAL NODE DISSECTION;  Surgeon: Melrose Nakayama, MD;  Location: [facility] OR;  Service: Thoracic;  Laterality: Right;   RIGHT/LEFT HEART CATH AND CORONARY ANGIOGRAPHY N/A 04/10/2018   Procedure: RIGHT/LEFT HEART CATH AND CORONARY ANGIOGRAPHY;  Surgeon: Leonie Man, MD;  Location: [facility];; angiographically minimal coronary artery disease.  Normal LVEDP.  Basely normal RHC pressures with no suggestion of pulmonary hypertension.  Normal CO-CI by Fick (5.9-2.8)   TONSILLECTOMY     TRANSTHORACIC ECHOCARDIOGRAM  11/'17; 2/'20   a) Nov 2017: Mild LVH. EF 60-65%. GR 2-DD; b) Feb 2020: Normal LV size and function.  EF 60 to 65%.  No R WMA.  GR 1 DD.  Normal RV size and function.  Normal pressures.  No valvular disease.   VIDEO ASSISTED THORACOSCOPY (VATS)/WEDGE RESECTION Right 04/23/2018   Procedure: VIDEO ASSISTED THORACOSCOPY (VATS)/WEDGE RESECTION OF RIGHT UPPER LUNG;  Surgeon: Melrose Nakayama, MD;  Location: [facility] OR;  Service:  Thoracic;  Laterality: Right;    Social History   Socioeconomic History   Marital status: Divorced    Spouse name: Not on file   Number of children: 2   Years of education: Not on file   Highest education level: Not on file  Occupational History   Occupation: Retired Freight forwarder    Comment: Veterinary surgeon  Tobacco Use   Smoking status: Former    Packs/day: 1.50    Years: 50.00    Pack years: 75.00    Types: Cigarettes    Quit date: 04/01/2011    Years since quitting: 9.9   Smokeless tobacco: Never   Tobacco comments:    has stopped but has started again in "crisis"  Vaping Use  Vaping Use: Never used  Substance and Sexual Activity   Alcohol use: Yes    Alcohol/week: 2.0 standard drinks    Types: 2 Glasses of wine per week    Comment: monthly   Drug use: No   Sexual activity: Never  Other Topics Concern   Not on file  Social History Narrative   Lives alone   No living will   Requests friend Porfirio Mylar to make health care decisions.   Would accept resuscitation but no prolonged ventilation   Would not want tube feeds if cognitively unaware   Social Determinants of Health   Financial Resource Strain: Not on file  Food Insecurity: Not on file  Transportation Needs: Not on file  Physical Activity: Not on file  Stress: Not on file  Social Connections: Not on file  Intimate Partner Violence: Not on file     Allergies  Allergen Reactions   Adhesive [Tape] Rash    Paper tape causes rash   Neosporin [Neomycin-Bacitracin Zn-Polymyx] Itching   Penicillins Rash    Did it involve swelling of the face/tongue/throat, SOB, or low BP? No Did it involve sudden or severe rash/hives, skin peeling, or any reaction on the inside of your mouth or nose? Yes Did you need to seek medical attention at a hospital or doctor's office? Yes When did it last happen? From childhood If all above answers are "NO", may proceed with cephalosporin use.      Outpatient Medications Prior to  Visit  Medication Sig Dispense Refill   albuterol (VENTOLIN HFA) 108 (90 Base) MCG/ACT inhaler TAKE 2 PUFFS BY MOUTH EVERY 6 HOURS AS NEEDED FOR WHEEZE OR SHORTNESS OF BREATH 8.5 g 3   aspirin EC 81 MG tablet Take 1 tablet (81 mg total) by mouth daily. 90 tablet 3   atorvastatin (LIPITOR) 10 MG tablet TAKE 1 TABLET BY MOUTH EVERY DAY 90 tablet 0   cholecalciferol (VITAMIN D3) 25 MCG (1000 UT) tablet Take 1,000 Units by mouth daily.     Fluticasone-Umeclidin-Vilant (TRELEGY ELLIPTA) 100-62.5-25 MCG/INH AEPB TAKE 1 PUFF BY MOUTH EVERY DAY 60 each 2   sodium chloride (OCEAN) 0.65 % SOLN nasal spray Place 1 spray into both nostrils as needed for congestion.  0   Budeson-Glycopyrrol-Formoterol (BREZTRI AEROSPHERE) 160-9-4.8 MCG/ACT AERO Inhale 2 puffs into the lungs in the morning and at bedtime. 10.7 g 0   Budeson-Glycopyrrol-Formoterol (BREZTRI AEROSPHERE) 160-9-4.8 MCG/ACT AERO Inhale 2 puffs into the lungs in the morning and at bedtime. 10.7 g 0   benzonatate (TESSALON) 100 MG capsule Take 1 capsule (100 mg total) by mouth 3 (three) times daily as needed for cough. (Patient not taking: Reported on 03/26/2021) 21 capsule 0   fluticasone (FLONASE) 50 MCG/ACT nasal spray Place 1 spray into both nostrils daily. (Patient not taking: Reported on 03/26/2021) 16 g 0   guaiFENesin (MUCINEX) 600 MG 12 hr tablet Take 1 tablet (600 mg total) by mouth 2 (two) times daily for 10 days. (Patient not taking: Reported on 03/26/2021) 20 tablet 0   No facility-administered medications prior to visit.    Review of Systems  Constitutional:  Negative for chills, fever, malaise/fatigue and weight loss.  HENT:  Negative for hearing loss, sore throat and tinnitus.   Eyes:  Negative for blurred vision and double vision.  Respiratory:  Positive for shortness of breath. Negative for cough, hemoptysis, sputum production, wheezing and stridor.   Cardiovascular:  Negative for chest pain, palpitations, orthopnea, leg swelling and  PND.  Gastrointestinal:  Negative for abdominal pain, constipation, diarrhea, heartburn, nausea and vomiting.  Genitourinary:  Negative for dysuria, hematuria and urgency.  Musculoskeletal:  Negative for joint pain and myalgias.  Skin:  Negative for itching and rash.  Neurological:  Negative for dizziness, tingling, weakness and headaches.  Endo/Heme/Allergies:  Negative for environmental allergies. Does not bruise/bleed easily.  Psychiatric/Behavioral:  Negative for depression. The patient is not nervous/anxious and does not have insomnia.   All other systems reviewed and are negative.   Objective:  Physical Exam Vitals reviewed.  Constitutional:      General: She is not in acute distress.    Appearance: She is well-developed.  HENT:     Head: Normocephalic and atraumatic.  Eyes:     General: No scleral icterus.    Conjunctiva/sclera: Conjunctivae normal.     Pupils: Pupils are equal, round, and reactive to light.  Neck:     Vascular: No JVD.     Trachea: No tracheal deviation.  Cardiovascular:     Rate and Rhythm: Normal rate and regular rhythm.     Heart sounds: Normal heart sounds. No murmur heard. Pulmonary:     Effort: Pulmonary effort is normal. No tachypnea, accessory muscle usage or respiratory distress.     Breath sounds: No stridor. No wheezing, rhonchi or rales.  Abdominal:     General: There is no distension.     Palpations: Abdomen is soft.     Tenderness: There is no abdominal tenderness.  Musculoskeletal:        General: No tenderness.     Cervical back: Neck supple.  Lymphadenopathy:     Cervical: No cervical adenopathy.  Skin:    General: Skin is warm and dry.     Capillary Refill: Capillary refill takes less than 2 seconds.     Findings: No rash.  Neurological:     Mental Status: She is alert and oriented to person, place, and time.  Psychiatric:        Behavior: Behavior normal.     Vitals:   03/26/21 0901  BP: (!) 146/68  Pulse: 70  Temp:  98.2 F (36.8 C)  TempSrc: Oral  SpO2: 99%  Weight: 222 lb (100.7 kg)  Height: 5\' 6"  (1.676 m)   99% on RA BMI Readings from Last 3 Encounters:  03/26/21 35.83 kg/m  12/10/20 39.29 kg/m  08/17/20 38.68 kg/m   Wt Readings from Last 3 Encounters:  03/26/21 222 lb (100.7 kg)  12/10/20 243 lb 6.4 oz (110.4 kg)  08/17/20 236 lb (107 kg)     CBC    Component Value Date/Time   WBC 8.3 07/13/2020 0939   WBC 8.3 02/18/2020 1628   RBC 4.75 07/13/2020 0939   HGB 14.7 07/13/2020 0939   HGB 15.2 04/04/2018 1643   HCT 43.9 07/13/2020 0939   HCT 45.5 04/04/2018 1643   PLT 320 07/13/2020 0939   PLT 406 04/04/2018 1643   MCV 92.4 07/13/2020 0939   MCV 91 04/04/2018 1643   MCH 30.9 07/13/2020 0939   MCHC 33.5 07/13/2020 0939   RDW 13.0 07/13/2020 0939   RDW 12.8 04/04/2018 1643   LYMPHSABS 2.4 07/13/2020 0939   MONOABS 0.6 07/13/2020 0939   EOSABS 0.2 07/13/2020 0939   BASOSABS 0.1 07/13/2020 0939    Chest Imaging: Lung cancer screening images 02/01/2017, 03/01/2018: Right upper lobe lung nodule increased in size from 2018- thousand 20  03/09/2018 nuclear medicine pet imaging: PET avid right upper lobe nodule, SUV 7.  Pulmonary Functions Testing Results: PFT Results Latest Ref Rng & Units 12/03/2018 03/06/2018  FVC-Pre L 2.04 2.11  FVC-Predicted Pre % 63 65  FVC-Post L 2.28 2.36  FVC-Predicted Post % 71 73  Pre FEV1/FVC % % 53 59  Post FEV1/FCV % % 54 60  FEV1-Pre L 1.09 1.25  FEV1-Predicted Pre % 44 50  FEV1-Post L 1.22 1.43  DLCO uncorrected ml/min/mmHg 17.94 21.31  DLCO UNC% % 85 101  DLVA Predicted % 108 125  TLC L 4.90 5.75  TLC % Predicted % 90 105  RV % Predicted % 109 144    FeNO: None   Pathology: None   Echocardiogram:   Study Conclusions   - Left ventricle: The cavity size was normal. Wall thickness was   increased in a pattern of mild LVH. Systolic function was normal.   The estimated ejection fraction was in the range of 60% to 65%.   Wall motion  was normal; there were no regional wall motion   abnormalities. Features are consistent with a pseudonormal left   ventricular filling pattern, with concomitant abnormal relaxation   and increased filling pressure (grade 2 diastolic dysfunction).   Heart Catheterization: None     Assessment & Plan:   Stage 3 severe COPD by GOLD classification (HCC)  Dyspnea on exertion  S/P Right Upper Lobectomy, Right Middle Lobectomy Lung  Discussion:  This is a 76 year old female, severe COPD FEV1 50% predicted, postbronchodilator response and a DLCO of 85%, relatively unchanged from her preop PFTs with reduced FEV1 1.4 L to 1.2 L.  She still has some dyspnea on exertion currently managed with triple therapy inhaler regimen.  She has lost weight which is also had improvement in some of her symptoms  Plan: Continue Trelegy inhaler She did not like using the [facility] because it was twice a day and she would routinely forget to use her second dose wanted to go back to the use of Trelegy. Continue albuterol for shortness of breath and wheezing. She has her surveillance CTs for oncology is ordered by Dr. Earlie Server.  Follow-up with [place] in 6 months for COPD management.   Current Outpatient Medications:    albuterol (VENTOLIN HFA) 108 (90 Base) MCG/ACT inhaler, TAKE 2 PUFFS BY MOUTH EVERY 6 HOURS AS NEEDED FOR WHEEZE OR SHORTNESS OF BREATH, Disp: 8.5 g, Rfl: 3   aspirin EC 81 MG tablet, Take 1 tablet (81 mg total) by mouth daily., Disp: 90 tablet, Rfl: 3   atorvastatin (LIPITOR) 10 MG tablet, TAKE 1 TABLET BY MOUTH EVERY DAY, Disp: 90 tablet, Rfl: 0   cholecalciferol (VITAMIN D3) 25 MCG (1000 UT) tablet, Take 1,000 Units by mouth daily., Disp: , Rfl:    Fluticasone-Umeclidin-Vilant (TRELEGY ELLIPTA) 100-62.5-25 MCG/INH AEPB, TAKE 1 PUFF BY MOUTH EVERY DAY, Disp: 60 each, Rfl: 2   sodium chloride (OCEAN) 0.65 % SOLN nasal spray, Place 1 spray into both nostrils as needed for congestion., Disp: , Rfl: 0    benzonatate (TESSALON) 100 MG capsule, Take 1 capsule (100 mg total) by mouth 3 (three) times daily as needed for cough. (Patient not taking: Reported on 03/26/2021), Disp: 21 capsule, Rfl: 0   fluticasone (FLONASE) 50 MCG/ACT nasal spray, Place 1 spray into both nostrils daily. (Patient not taking: Reported on 03/26/2021), Disp: 16 g, Rfl: 0   guaiFENesin (MUCINEX) 600 MG 12 hr tablet, Take 1 tablet (600 mg total) by mouth 2 (two) times daily for 10 days. (Patient not taking: Reported on 03/26/2021), Disp: 20  tablet, Rfl: 0   Garner Nash, DO [facility] 03/26/2021 9:20 AM

## 2021-04-08 ENCOUNTER — Ambulatory Visit
Admission: EM | Admit: 2021-04-08 | Discharge: 2021-04-08 | Disposition: A | Discharge status: Home / Self Care | Payer: Medicare HMO

## 2021-04-08 ENCOUNTER — Encounter: Payer: Self-pay | Admitting: Emergency Medicine

## 2021-04-08 ENCOUNTER — Other Ambulatory Visit: Payer: Self-pay

## 2021-04-08 DIAGNOSIS — H6691 Otitis media, unspecified, right ear: Secondary | ICD-10-CM | POA: Diagnosis not present

## 2021-04-08 DIAGNOSIS — S00412A Abrasion of left ear, initial encounter: Secondary | ICD-10-CM

## 2021-04-08 MED ORDER — CEFDINIR 300 MG PO CAPS: 300.0000 mg | ORAL_CAPSULE | Freq: Two times a day (BID) | ORAL | 0 refills | Status: DC

## 2021-04-08 NOTE — ED Triage Notes (Signed)
Pt c/o bilateral ear pain x 2 days

## 2021-04-08 NOTE — Discharge Instructions (Signed)
See your Physicain for recheck in 1 week

## 2021-04-15 NOTE — ED Provider Notes (Signed)
[name]    CSN: [id number] Arrival date & time: 04/08/21  1227      History   Chief Complaint Chief Complaint  Patient presents with   Otalgia    HPI Hannah Green is a 76 y.o. female.   The history is provided by the patient. No language interpreter was used.  Otalgia Behind ear:  No abnormality Quality:  Aching Severity:  Severe Onset quality:  Gradual Duration:  2 days Timing:  Constant Progression:  Worsening Chronicity:  New Relieved by:  Nothing  Past Medical History:  Diagnosis Date   Breast cancer ([place]) 1999   Cataract 2017   resolved with surgery   COPD (chronic obstructive pulmonary disease) ([place])    Coronary artery disease    Dysplastic nevus 09/04/2012   R prox calf - mild    GERD (gastroesophageal reflux disease)    Hyperlipidemia    Postoperative atrial fibrillation ([place]) 03/2018   Rheumatoid arthritis(714.0)    Squamous cell carcinoma of lung, stage I, right ([place]) 2020   Status post right upper and middle lobectomy    Patient Active Problem List   Diagnosis Date Noted   Tremor 02/18/2020   Skin lesion of cheek 02/18/2020   Left-sided chest wall pain 08/29/2019   Pain and swelling of right lower leg 08/29/2019   Acute neck pain 12/25/2018   Goals of care, counseling/discussion 06/12/2018   Stage I squamous cell carcinoma of right lung ([place]) 06/12/2018   HCAP (healthcare-associated pneumonia) 05/08/2018   History of lung cancer 04/24/2018   S/P Right Upper Lobectomy, Right Middle Lobectomy Lung 04/23/2018   Pain and swelling of wrist, right 04/18/2018   Exertional chest pain 04/04/2018   Pulmonary nodule 03/05/2018   Prediabetes 01/08/2018   COPD exacerbation ([place]) 04/27/2017   COPD (chronic obstructive pulmonary disease) ([place]) 08/21/7626   Diastolic dysfunction without heart failure 02/01/2016   Osteopenia of femoral neck 01/12/2016   Rosacea 12/22/2015   Constipation, chronic 11/11/2014   Basal cell carcinoma of left  side of nose 06/01/2012   Routine general medical examination at a health care facility 06/02/2011   GERD (gastroesophageal reflux disease)    Vitamin D deficiency 02/13/2008   ADENOCARCINOMA, BREAST 02/08/2008   Hyperlipidemia with target LDL less than 100 02/08/2008   Rheumatoid arthritis(714.0) 02/08/2008    Past Surgical History:  Procedure Laterality Date   BREAST LUMPECTOMY  1999   left, needed radiation   CATARACT EXTRACTION W/ INTRAOCULAR LENS  IMPLANT, BILATERAL Bilateral 05/19/15, 06/02/15   CHEST TUBE INSERTION Right 04/23/2018   Procedure: Right Lateral Chest Tube Insertion;  Surgeon: Melrose Nakayama, MD;  Location: [facility] OR;  Service: Thoracic;  Laterality: Right;   CHOLECYSTECTOMY     LOBECTOMY Right 04/23/2018   Procedure: RIGHT UPPER LOBECTOMY;  Surgeon: Melrose Nakayama, MD;  Location: [facility] OR;  Service: Thoracic;  Laterality: Right;   NM MYOVIEW LTD  01/2016   LOW RISK. Hypertensive response to exercise followed by significant drop (stage I pressure 211/70 drop to 118/75. Stage II). Apical artifact but otherwise no ischemia or infarction. EF 64%.   NODE DISSECTION Right 04/23/2018   Procedure: MEDIASTINAL NODE DISSECTION;  Surgeon: Melrose Nakayama, MD;  Location[facility] OR;  Service: Thoracic;  Laterality: Right;   RIGHT/LEFT HEART CATH AND CORONARY ANGIOGRAPHY N/A 04/10/2018   Procedure: RIGHT/LEFT HEART CATH AND CORONARY ANGIOGRAPHY;  Surgeon: Leonie Man, MD;  Location: [facility] LAB;; angiographically minimal coronary artery disease.  Normal LVEDP.  Basely normal RHC pressures with no suggestion of pulmonary hypertension.  Normal CO-CI by Fick (5.9-2.8)   TONSILLECTOMY     TRANSTHORACIC ECHOCARDIOGRAM  11/'17; 2/'20   a) Nov 2017: Mild LVH. EF 60-65%. GR 2-DD; b) Feb 2020: Normal LV size and function.  EF 60 to 65%.  No R WMA.  GR 1 DD.  Normal RV size and function.  Normal pressures.  No valvular disease.   VIDEO ASSISTED THORACOSCOPY (VATS)/WEDGE  RESECTION Right 04/23/2018   Procedure: VIDEO ASSISTED THORACOSCOPY (VATS)/WEDGE RESECTION OF RIGHT UPPER LUNG;  Surgeon: Melrose Nakayama, MD;  Location: [facility];  Service: Thoracic;  Laterality: Right;    OB History   No obstetric history on file.      Home Medications    Prior to Admission medications   Medication Sig Start Date End Date Taking? Authorizing Provider  albuterol (VENTOLIN HFA) 108 (90 Base) MCG/ACT inhaler TAKE 2 PUFFS BY MOUTH EVERY 6 HOURS AS NEEDED FOR WHEEZE OR SHORTNESS OF BREATH 09/16/19  Yes Magdalen Spatz, NP  aspirin EC 81 MG tablet Take 1 tablet (81 mg total) by mouth daily. 04/04/18  Yes Leonie Man, MD  cefdinir (OMNICEF) 300 MG capsule Take 1 capsule (300 mg total) by mouth 2 (two) times daily. 04/08/21  Yes Fransico Meadow, PA-C  cholecalciferol (VITAMIN D3) 25 MCG (1000 UT) tablet Take 1,000 Units by mouth daily.   Yes [provider]  Fluticasone-Umeclidin-Vilant (TRELEGY ELLIPTA) 100-62.5-25 MCG/INH AEPB TAKE 1 PUFF BY MOUTH EVERY DAY 11/20/20  Yes Magdalen Spatz, NP  omeprazole (PRILOSEC) 20 MG capsule 1 cap(s) orally once a day for 90 days 01/04/18  Yes [provider]  atorvastatin (LIPITOR) 10 MG tablet TAKE 1 TABLET BY MOUTH EVERY DAY 12/09/19   Bedsole, Amy E, MD  benzonatate (TESSALON) 100 MG capsule Take 1 capsule (100 mg total) by mouth 3 (three) times daily as needed for cough. Patient not taking: Reported on 03/26/2021 03/19/21   Chase Picket, MD  fluticasone [facility]) 50 MCG/ACT nasal spray Place 1 spray into both nostrils daily. Patient not taking: Reported on 03/26/2021 03/19/21   Chase Picket, MD  sodium chloride (OCEAN) 0.65 % SOLN nasal spray Place 1 spray into both nostrils as needed for congestion. 03/19/21   Lamptey, Myrene Galas, MD    Family History Family History  Problem Relation Age of Onset   Cancer Mother        breast   Cancer Father        lung cancer   Cancer Sister        throat cancer   Diabetes  Paternal Uncle    Diabetes Maternal Grandmother     Social History Social History   Tobacco Use   Smoking status: Former    Packs/day: 1.50    Years: 50.00    Pack years: 75.00    Types: Cigarettes    Quit date: 04/01/2011    Years since quitting: 10.0   Smokeless tobacco: Never   Tobacco comments:    has stopped but has started again in "crisis"  Vaping Use   Vaping Use: Never used  Substance Use Topics   Alcohol use: Yes    Alcohol/week: 2.0 standard drinks    Types: 2 Glasses of wine per week    Comment: monthly   Drug use: No     Allergies   Adhesive [tape], Neosporin [neomycin-bacitracin zn-polymyx], and Penicillins   Review of Systems Review of Systems  HENT:  Positive for ear pain.   All other systems reviewed and are negative.   Physical Exam Triage Vital Signs ED Triage Vitals  Enc Vitals Group     BP 04/08/21 1252 130/73     Pulse Rate 04/08/21 1252 68     Resp 04/08/21 1252 18     Temp 04/08/21 1252 98 F (36.7 C)     Temp Source 04/08/21 1252 Oral     SpO2 04/08/21 1252 96 %     Weight --      Height --      Head Circumference --      Peak Flow --      Pain Score 04/08/21 1250 8     Pain Loc --      Pain Edu? --      Excl. in [place]? --    No data found.  Updated Vital Signs BP 130/73 (BP Location: Left Arm)    Pulse 68    Temp 98 F (36.7 C) (Oral)    Resp 18    LMP  (LMP Unknown)    SpO2 96%   Visual Acuity Right Eye Distance:   Left Eye Distance:   Bilateral Distance:    Right Eye Near:   Left Eye Near:    Bilateral Near:     Physical Exam Vitals reviewed.  Constitutional:      Appearance: Normal appearance.  HENT:     Ears:     Comments: Right tm erythematous,  left tm clear,  small abrasion ear canal    Nose: Nose normal.  Eyes:     Pupils: Pupils are equal, round, and reactive to light.  Cardiovascular:     Rate and Rhythm: Normal rate.  Pulmonary:     Effort: Pulmonary effort is normal.  Abdominal:     General:  Abdomen is flat.  Skin:    General: Skin is warm.  Neurological:     General: No focal deficit present.     Mental Status: She is alert.  Psychiatric:        Mood and Affect: Mood normal.     UC Treatments / Results  Labs (all labs ordered are listed, but only abnormal results are displayed) Labs Reviewed - No data to display  EKG   Radiology No results found.  Procedures Procedures (including critical care time)  Medications Ordered in UC Medications - No data to display  Initial Impression / Assessment and Plan / UC Course  I have reviewed the triage vital signs and the nursing notes.  Pertinent labs & imaging results that were available during my care of the patient were reviewed by me and considered in my medical decision making (see chart for details).      Final Clinical Impressions(s) / UC Diagnoses   Final diagnoses:  Right otitis media, unspecified otitis media type  Abrasion of left ear canal, initial encounter     Discharge Instructions      See your Physicain for recheck in 1 week    ED Prescriptions     Medication Sig Dispense Auth. Provider   cefdinir (OMNICEF) 300 MG capsule Take 1 capsule (300 mg total) by mouth 2 (two) times daily. 20 capsule Fransico Meadow, [place]      PDMP not reviewed this encounter. An After Visit Summary was printed and given to the patient.    Fransico Meadow, [place] 04/15/21 1400

## 2021-05-20 DIAGNOSIS — H6982 Other specified disorders of Eustachian tube, left ear: Secondary | ICD-10-CM | POA: Diagnosis not present

## 2021-05-20 DIAGNOSIS — H903 Sensorineural hearing loss, bilateral: Secondary | ICD-10-CM | POA: Diagnosis not present

## 2021-05-25 ENCOUNTER — Ambulatory Visit: Payer: Medicare HMO | Admitting: Dermatology

## 2021-05-25 ENCOUNTER — Other Ambulatory Visit: Payer: Self-pay

## 2021-05-25 DIAGNOSIS — L720 Epidermal cyst: Secondary | ICD-10-CM | POA: Diagnosis not present

## 2021-05-25 DIAGNOSIS — L905 Scar conditions and fibrosis of skin: Secondary | ICD-10-CM | POA: Diagnosis not present

## 2021-05-25 DIAGNOSIS — L719 Rosacea, unspecified: Secondary | ICD-10-CM | POA: Diagnosis not present

## 2021-05-25 DIAGNOSIS — L578 Other skin changes due to chronic exposure to nonionizing radiation: Secondary | ICD-10-CM

## 2021-05-25 DIAGNOSIS — L570 Actinic keratosis: Secondary | ICD-10-CM

## 2021-05-25 DIAGNOSIS — L738 Other specified follicular disorders: Secondary | ICD-10-CM

## 2021-05-25 MED ORDER — FLUOROURACIL 5 % EX CREA: TOPICAL_CREAM | Freq: Two times a day (BID) | CUTANEOUS | 0 refills | Status: DC

## 2021-05-25 NOTE — Patient Instructions (Addendum)
In 4 weeks, start Fluorouracil/Calcipotriene cream from [facility] twice daily to affected area of left cheek for 1 week. May get red and irritated. ? ? ? ? ?Cryotherapy Aftercare ? ?Wash gently with soap and water everyday.   ?Apply Vaseline and Band-Aid daily until healed.  ? ? ?If You Need Anything After Your Visit ? ?If you have any questions or concerns for your doctor, please call our main line at [phone] and press option 4 to reach your doctor's medical assistant. If no one answers, please leave a voicemail as directed and we will return your call as soon as possible. Messages left after 4 pm will be answered the following business day.  ? ?You may also send [place] a message via MyChart. We typically respond to MyChart messages within 1-2 business days. ? ?For prescription refills, please ask your pharmacy to contact our office. Our fax number is [phone]. ? ?If you have an urgent issue when the clinic is closed that cannot wait until the next business day, you can page your doctor at the number below.   ? ?Please note that while we do our best to be available for urgent issues outside of office hours, we are not available 24/7.  ? ?If you have an urgent issue and are unable to reach [place], you may choose to seek medical care at your doctor's office, retail clinic, urgent care center, or emergency room. ? ?If you have a medical emergency, please immediately call 911 or go to the emergency department. ? ?Pager Numbers ? ?- Dr. Nehemiah Massed: [phone] ? ?- Dr. Laurence Ferrari: [phone] ? ?- Dr. Nicole Kindred: [phone] ? ?In the event of inclement weather, please call our main line at [phone] for an update on the status of any delays or closures. ? ?Dermatology Medication Tips: ?Please keep the boxes that topical medications come in in order to help keep track of the instructions about where and how to use these. Pharmacies typically print the medication instructions only on the boxes and not directly on the  medication tubes.  ? ?If your medication is too expensive, please contact our office at [phone] option 4 or send [place] a message through [place].  ? ?We are unable to tell what your co-pay for medications will be in advance as this is different depending on your insurance coverage. However, we may be able to find a substitute medication at lower cost or fill out paperwork to get insurance to cover a needed medication.  ? ?If a prior authorization is required to get your medication covered by your insurance company, please allow [place] 1-2 business days to complete this process. ? ?Drug prices often vary depending on where the prescription is filled and some pharmacies may offer cheaper prices. ? ?The website www.goodrx.com contains coupons for medications through different pharmacies. The prices here do not account for what the cost may be with help from insurance (it may be cheaper with your insurance), but the website can give you the price if you did not use any insurance.  ?- You can print the associated coupon and take it with your prescription to the pharmacy.  ?- You may also stop by our office during regular business hours and pick up a GoodRx coupon card.  ?- If you need your prescription sent electronically to a different pharmacy, notify our office through [facility] or by phone at [phone] option 4. ? ? ? ? ?Si Usted Necesita Algo Despu?s de Su Visita ? ?Tambi?n puede enviarnos un  mensaje a trav?s de MyChart. Por lo general respondemos a los mensajes de MyChart en el transcurso de 1 a 2 d?as h?biles. ? ?Para renovar recetas, por favor pida a su farmacia que se ponga en contacto con nuestra oficina. Nuestro n?mero de fax es el [phone]. ? ?Si tiene un asunto urgente cuando la cl?nica est? cerrada y que no puede esperar hasta el siguiente d?a h?bil, puede llamar/localizar a su doctor(a) al n?mero que aparece a continuaci?n.  ? ?Por favor, tenga en cuenta que aunque hacemos todo lo posible  para estar disponibles para asuntos urgentes fuera del horario de oficina, no estamos disponibles las 24 horas del d?a, los 7 d?as de la semana.  ? ?Si tiene un problema urgente y no puede comunicarse con nosotros, puede optar por buscar atenci?n m?dica  en el consultorio de su doctor(a), en una cl?nica privada, en un centro de atenci?n urgente o en una sala de emergencias. ? ?Si tiene Engineer, maintenance (IT) m?dica, por favor llame inmediatamente al 911 o vaya a la sala de emergencias. ? ?N?meros de b?per ? ?- Dr. Nehemiah Massed: [phone] ? ?- Dra. Moye: [phone] ? ?- Dra. Nicole Kindred: [phone] ? ?En caso de inclemencias del tiempo, por favor llame a nuestra l?nea principal al [phone] para una actualizaci?n sobre el estado de cualquier retraso o cierre. ? ?Consejos para la medicaci?n en dermatolog?a: ?Por favor, guarde las cajas en las que vienen los medicamentos de uso t?pico para ayudarle a seguir las instrucciones sobre d?nde y c?mo usarlos. Las farmacias generalmente imprimen las instrucciones del medicamento s?lo en las cajas y no directamente en los tubos del [place].  ? ?Si su medicamento es muy caro, por favor, p?ngase en contacto con Zigmund Daniel llamando al [phone] y presione la opci?n 4 o env?enos un mensaje a trav?s de MyChart.  ? ?No podemos decirle cu?l ser? su copago por los medicamentos por adelantado ya que esto es diferente dependiendo de la cobertura de su seguro. Sin embargo, es posible que podamos encontrar un medicamento sustituto a Electrical engineer un formulario para que el seguro cubra el medicamento que se considera necesario.  ? ?Si se requiere Ardelia Mems autorizaci?n previa para que su compa??a de seguros [place] su medicamento, por favor perm?tanos de 1 a 2 d?as h?biles para completar este proceso. ? ?Los precios de los medicamentos var?an con frecuencia dependiendo del Environmental consultant de d?nde se surte la receta y alguna farmacias pueden ofrecer precios m?s baratos. ? ?El sitio web  www.goodrx.com tiene cupones para medicamentos de Airline pilot. Los precios aqu? no tienen en cuenta lo que podr?a costar con la ayuda del seguro (puede ser m?s barato con su seguro), pero el sitio web puede darle el precio si no utiliz? ning?n seguro.  ?- Puede imprimir el cup?n correspondiente y llevarlo con su receta a la farmacia.  ?- Tambi?n puede pasar por nuestra oficina durante el horario de atenci?n regular y recoger una tarjeta de cupones de GoodRx.  ?- Si necesita que su receta se env?e electr?nicamente a Chiropodist, informe a nuestra oficina a trav?s de MyChart de [place] o por tel?fono llamando al [phone] y presione la opci?n 4.  ?

## 2021-05-25 NOTE — Progress Notes (Signed)
? ?Follow-Up Visit ?  ?Subjective  ?Hannah Green is a 76 y.o. female who presents for the following: Follow-up (Recheck scar/AK of left medial cheek). ?The patient has spots, moles and lesions to be evaluated, some may be new or changing and the patient has concerns that these could be cancer. ? ?The following portions of the chart were reviewed this encounter and updated as appropriate:  ? Tobacco  Allergies  Meds  Problems  Med Hx  Surg Hx  Fam Hx   ?  ?Review of Systems:  No other skin or systemic complaints except as noted in HPI or Assessment and Plan. ? ?Objective  ?Well appearing patient in no apparent distress; mood and affect are within normal limits. ? ?A focused examination was performed including face. Relevant physical exam findings are noted in the Assessment and Plan. ? ?Face ?Smooth white papule(s).  ? ?Face ?Yellow papules ? ?Left medial cheek ?Residual scaly pink papule at inf edge of scar ? ?Face ?Pinkness and dilated blood vessels ? ?Left medial cheek ?Scar ? ? ?Assessment & Plan  ?[place] ?Face ?Will plan to start tretinoin on follow up ?Benign-appearing.  Call clinic for new or changing lesions.  Recommend daily use of broad spectrum spf 30+ sunscreen to sun-exposed areas.  ? ?Sebaceous hyperplasia ?Face ?Benign-appearing.  Observation.  Call clinic for new or changing lesions.  Recommend daily use of broad spectrum spf 30+ sunscreen to sun-exposed areas.  ? ?AK (actinic keratosis) - biopsy proven; persistent ?Left medial cheek ?Actinic Damage - Severe, confluent actinic changes with pre-cancerous actinic keratoses  ?- Severe, chronic, not at goal, secondary to cumulative UV radiation exposure over time ?- diffuse scaly erythematous macules and papules with underlying dyspigmentation ?- Discussed Prescription "Field Treatment" for Severe, Chronic Confluent Actinic Changes with Pre-Cancerous Actinic Keratoses ?Field treatment involves treatment of an entire area of skin that has  confluent Actinic Changes (Sun/ Ultraviolet light damage) and PreCancerous Actinic Keratoses by method of PhotoDynamic Therapy (PDT) and/or prescription Topical Chemotherapy agents such as 5-fluorouracil, 5-fluorouracil/calcipotriene, and/or imiquimod.  The purpose is to decrease the number of clinically evident and subclinical PreCancerous lesions to prevent progression to development of skin cancer by chemically destroying early precancer changes that may or may not be visible.  It has been shown to reduce the risk of developing skin cancer in the treated area. As a result of treatment, redness, scaling, crusting, and open sores may occur during treatment course. One or more than one of these methods may be used and may have to be used several times to control, suppress and eliminate the PreCancerous changes. Discussed treatment course, expected reaction, and possible side effects. ?- Recommend daily broad spectrum sunscreen SPF 30+ to sun-exposed areas, reapply every 2 hours as needed.  ?- Staying in the shade or wearing long sleeves, sun glasses (UVA+UVB protection) and wide brim hats (4-inch brim around the entire circumference of the hat) are also recommended. ?- Call for new or changing lesions. ? ?Start Fluoroucil 5%/Calcipotriene cream bid to left medial cheek x 1 week ? ?Destruction of lesion - Left medial cheek ?Complexity: simple   ?Destruction method: cryotherapy   ?Informed consent: discussed and consent obtained   ?Timeout:  patient name, date of birth, surgical site, and procedure verified ?Lesion destroyed using liquid nitrogen: Yes   ?Region frozen until ice ball extended beyond lesion: Yes   ?Outcome: patient tolerated procedure well with no complications   ?Post-procedure details: wound care instructions given   ? ?fluorouracil (EFUDEX)  5 % cream - Left medial cheek ?Apply topically 2 (two) times daily. Apply to affected area of left medial cheek x 1 week ? ?Rosacea ?Face ?Well  controlled ?Rosacea is a chronic progressive skin condition usually affecting the face of adults, causing redness and/or acne bumps. It is treatable but not curable. It sometimes affects the eyes (ocular rosacea) as well. It may respond to topical and/or systemic medication and can flare with stress, sun exposure, alcohol, exercise and some foods.  Daily application of broad spectrum spf 30+ sunscreen to face is recommended to reduce flares. ? ?Continue Rosacea Triple Cream qd-bid  ? ?Scar ?Left medial cheek ?Per Dr. Armanda Magic office note, this area appears to be a biopsy proven AK, which was treated with LN2 at her follow up appointment. ? ?Return in about 3 months (around 08/25/2021) for AK follow up. ? ?I, Ashok Cordia, CMA, am acting as scribe for Sarina Ser, MD . ?Documentation: I have reviewed the above documentation for accuracy and completeness, and I agree with the above. ? ?Sarina Ser, MD ? ?

## 2021-05-27 ENCOUNTER — Encounter: Payer: Self-pay | Admitting: Dermatology

## 2021-06-08 DIAGNOSIS — H6982 Other specified disorders of Eustachian tube, left ear: Secondary | ICD-10-CM | POA: Diagnosis not present

## 2021-07-05 ENCOUNTER — Other Ambulatory Visit: Payer: Self-pay

## 2021-07-12 ENCOUNTER — Ambulatory Visit (HOSPITAL_COMMUNITY): Payer: Medicare HMO

## 2021-07-12 ENCOUNTER — Other Ambulatory Visit: Payer: Medicare HMO

## 2021-07-13 DIAGNOSIS — Z7951 Long term (current) use of inhaled steroids: Secondary | ICD-10-CM | POA: Diagnosis not present

## 2021-07-13 DIAGNOSIS — Z85118 Personal history of other malignant neoplasm of bronchus and lung: Secondary | ICD-10-CM | POA: Diagnosis not present

## 2021-07-13 DIAGNOSIS — Z853 Personal history of malignant neoplasm of breast: Secondary | ICD-10-CM | POA: Diagnosis not present

## 2021-07-13 DIAGNOSIS — Z803 Family history of malignant neoplasm of breast: Secondary | ICD-10-CM | POA: Diagnosis not present

## 2021-07-13 DIAGNOSIS — Z008 Encounter for other general examination: Secondary | ICD-10-CM | POA: Diagnosis not present

## 2021-07-13 DIAGNOSIS — J309 Allergic rhinitis, unspecified: Secondary | ICD-10-CM | POA: Diagnosis not present

## 2021-07-13 DIAGNOSIS — Z6833 Body mass index (BMI) 33.0-33.9, adult: Secondary | ICD-10-CM | POA: Diagnosis not present

## 2021-07-13 DIAGNOSIS — G8929 Other chronic pain: Secondary | ICD-10-CM | POA: Diagnosis not present

## 2021-07-13 DIAGNOSIS — E669 Obesity, unspecified: Secondary | ICD-10-CM | POA: Diagnosis not present

## 2021-07-13 DIAGNOSIS — R030 Elevated blood-pressure reading, without diagnosis of hypertension: Secondary | ICD-10-CM | POA: Diagnosis not present

## 2021-07-13 DIAGNOSIS — Z79899 Other long term (current) drug therapy: Secondary | ICD-10-CM | POA: Diagnosis not present

## 2021-07-13 DIAGNOSIS — Z7982 Long term (current) use of aspirin: Secondary | ICD-10-CM | POA: Diagnosis not present

## 2021-07-13 DIAGNOSIS — J439 Emphysema, unspecified: Secondary | ICD-10-CM | POA: Diagnosis not present

## 2021-07-14 ENCOUNTER — Ambulatory Visit: Payer: Medicare HMO | Admitting: Internal Medicine

## 2021-08-06 DIAGNOSIS — H6982 Other specified disorders of Eustachian tube, left ear: Secondary | ICD-10-CM | POA: Diagnosis not present

## 2021-08-06 DIAGNOSIS — H903 Sensorineural hearing loss, bilateral: Secondary | ICD-10-CM | POA: Diagnosis not present

## 2021-09-15 ENCOUNTER — Ambulatory Visit: Payer: Medicare HMO | Admitting: Dermatology

## 2021-09-15 DIAGNOSIS — L650 Telogen effluvium: Secondary | ICD-10-CM

## 2021-09-15 DIAGNOSIS — L578 Other skin changes due to chronic exposure to nonionizing radiation: Secondary | ICD-10-CM | POA: Diagnosis not present

## 2021-09-15 DIAGNOSIS — L570 Actinic keratosis: Secondary | ICD-10-CM | POA: Diagnosis not present

## 2021-09-15 NOTE — Patient Instructions (Addendum)
5-Fluorouracil/Calcipotriene Patient Education   In September re start 5FU/Calcipotriene apply to left cheek twice a day for 7 days    Actinic keratoses are the dry, red scaly spots on the skin caused by sun damage. A portion of these spots can turn into skin cancer with time, and treating them can help prevent development of skin cancer.   Treatment of these spots requires removal of the defective skin cells. There are various ways to remove actinic keratoses, including freezing with liquid nitrogen, treatment with creams, or treatment with a blue light procedure in the office.   5-fluorouracil cream is a topical cream used to treat actinic keratoses. It works by interfering with the growth of abnormal fast-growing skin cells, such as actinic keratoses. These cells peel off and are replaced by healthy ones.   5-fluorouracil/calcipotriene is a combination of the 5-fluorouracil cream with a vitamin D analog cream called calcipotriene. The calcipotriene alone does not treat actinic keratoses. However, when it is combined with 5-fluorouracil, it helps the 5-fluorouracil treat the actinic keratoses much faster so that the same results can be achieved with a much shorter treatment time.  INSTRUCTIONS FOR 5-FLUOROURACIL/CALCIPOTRIENE CREAM:   5-fluorouracil/calcipotriene cream typically only needs to be used for 4-7 days. A thin layer should be applied twice a day to the treatment areas recommended by your physician.   If your physician prescribed you separate tubes of 5-fluourouracil and calcipotriene, apply a thin layer of 5-fluorouracil followed by a thin layer of calcipotriene.   Avoid contact with your eyes, nostrils, and mouth. Do not use 5-fluorouracil/calcipotriene cream on infected or open wounds.   You will develop redness, irritation and some crusting at areas where you have pre-cancer damage/actinic keratoses. IF YOU DEVELOP PAIN, BLEEDING, OR SIGNIFICANT CRUSTING, STOP THE TREATMENT EARLY  - you have already gotten a good response and the actinic keratoses should clear up well.  Wash your hands after applying 5-fluorouracil 5% cream on your skin.   A moisturizer or sunscreen with a minimum SPF 30 should be applied each morning.   Once you have finished the treatment, you can apply a thin layer of Vaseline twice a day to irritated areas to soothe and calm the areas more quickly. If you experience significant discomfort, contact your physician.  For some patients it is necessary to repeat the treatment for best results.  SIDE EFFECTS: When using 5-fluorouracil/calcipotriene cream, you may have mild irritation, such as redness, dryness, swelling, or a mild burning sensation. This usually resolves within 2 weeks. The more actinic keratoses you have, the more redness and inflammation you can expect during treatment. Eye irritation has been reported rarely. If this occurs, please let us know.  If you have any trouble using this cream, please call the office. If you have any other questions about this information, please do not hesitate to ask me before you leave the office.    Due to recent changes in healthcare laws, you may see results of your pathology and/or laboratory studies on MyChart before the doctors have had a chance to review them. We understand that in some cases there may be results that are confusing or concerning to you. Please understand that not all results are received at the same time and often the doctors may need to interpret multiple results in order to provide you with the best plan of care or course of treatment. Therefore, we ask that you please give [place] 2 business days to thoroughly review all your results before contacting the  office for clarification. Should we see a critical lab result, you will be contacted sooner.   If You Need Anything After Your Visit  If you have any questions or concerns for your doctor, please call our main line at [phone] and  press option 4 to reach your doctor's medical assistant. If no one answers, please leave a voicemail as directed and we will return your call as soon as possible. Messages left after 4 pm will be answered the following business day.   You may also send [place] a message via [place]. We typically respond to MyChart messages within 1-2 business days.  For prescription refills, please ask your pharmacy to contact our office. Our fax number is [phone].  If you have an urgent issue when the clinic is closed that cannot wait until the next business day, you can page your doctor at the number below.    Please note that while we do our best to be available for urgent issues outside of office hours, we are not available 24/7.   If you have an urgent issue and are unable to reach [place], you may choose to seek medical care at your doctor's office, retail clinic, urgent care center, or emergency room.  If you have a medical emergency, please immediately call 911 or go to the emergency department.  Pager Numbers  - Dr. Nehemiah Massed: [phone]  - Dr. Laurence Ferrari: [phone]  - Dr. Nicole Kindred: [phone]  In the event of inclement weather, please call our main line at [phone] for an update on the status of any delays or closures.  Dermatology Medication Tips: Please keep the boxes that topical medications come in in order to help keep track of the instructions about where and how to use these. Pharmacies typically print the medication instructions only on the boxes and not directly on the medication tubes.   If your medication is too expensive, please contact our office at [phone] option 4 or send [place] a message through [place].   We are unable to tell what your co-pay for medications will be in advance as this is different depending on your insurance coverage. However, we may be able to find a substitute medication at lower cost or fill out paperwork to get insurance to cover a needed medication.   If  a prior authorization is required to get your medication covered by your insurance company, please allow [place] 1-2 business days to complete this process.  Drug prices often vary depending on where the prescription is filled and some pharmacies may offer cheaper prices.  The website www.goodrx.com contains coupons for medications through different pharmacies. The prices here do not account for what the cost may be with help from insurance (it may be cheaper with your insurance), but the website can give you the price if you did not use any insurance.  - You can print the associated coupon and take it with your prescription to the pharmacy.  - You may also stop by our office during regular business hours and pick up a GoodRx coupon card.  - If you need your prescription sent electronically to a different pharmacy, notify our office through [facility] or by phone at [phone] option 4.     Si Usted Necesita Algo Despus de Su Visita  Tambin puede enviarnos un mensaje a travs de Pharmacist, community. Por lo general respondemos a los mensajes de MyChart en el transcurso de 1 a 2 das hbiles.  Para renovar recetas, por favor pida a su farmacia  que se ponga en contacto con nuestra oficina. Harland Dingwall de fax es [place] [phone].  Si tiene un asunto urgente cuando la clnica est cerrada y que no puede esperar hasta el siguiente da hbil, puede llamar/localizar a su doctor(a) al nmero que aparece a continuacin.   Por favor, tenga en cuenta que aunque hacemos todo lo posible para estar disponibles para asuntos urgentes fuera del horario de [place], no estamos disponibles las 24 horas del da, los 7 das de la [place].   Si tiene un problema urgente y no puede comunicarse con nosotros, puede optar por buscar atencin mdica  en el consultorio de su doctor(a), en una clnica privada, en un centro de atencin urgente o en una sala de emergencias.  Si tiene Engineering geologist, por favor llame  inmediatamente al 911 o vaya a la sala de emergencias.  Nmeros de bper  - Dr. Nehemiah Massed: [phone]  - Dra. Moye: [phone]  - Dra. Nicole Kindred: [phone]  En caso de inclemencias del [place], por favor llame a Johnsie Kindred principal al [phone] para una actualizacin sobre el [place] de cualquier retraso o cierre.  Consejos para la medicacin en dermatologa: Por favor, guarde las cajas en las que vienen los medicamentos de uso tpico para ayudarle a seguir las instrucciones sobre dnde y cmo usarlos. Las farmacias generalmente imprimen las instrucciones del medicamento slo en las cajas y no directamente en los tubos del [place].   Si su medicamento es muy caro, por favor, pngase en contacto con Zigmund Daniel llamando al [phone] y presione la opcin 4 o envenos un mensaje a travs de Pharmacist, community.   No podemos decirle cul ser su copago por los medicamentos por adelantado ya que esto es diferente dependiendo de la cobertura de su seguro. Sin embargo, es posible que podamos encontrar un medicamento sustituto a Electrical engineer un formulario para que el seguro cubra el medicamento que se considera necesario.   Si se requiere una autorizacin previa para que su compaa de seguros [place] su medicamento, por favor permtanos de 1 a 2 das hbiles para completar este proceso.  Los precios de los medicamentos varan con frecuencia dependiendo del Environmental consultant de dnde se surte la receta y alguna farmacias pueden ofrecer precios ms baratos.  El sitio web www.goodrx.com tiene cupones para medicamentos de Airline pilot. Los precios aqu no tienen en cuenta lo que podra costar con la ayuda del seguro (puede ser ms barato con su seguro), pero el sitio web puede darle el precio si no utiliz Research scientist (physical sciences).  - Puede imprimir el cupn correspondiente y llevarlo con su receta a la farmacia.  - Tambin puede pasar por nuestra oficina durante el horario de atencin regular y Charity fundraiser  una tarjeta de cupones de GoodRx.  - Si necesita que su receta se enve electrnicamente a una farmacia diferente, informe a nuestra oficina a travs de MyChart de [place] o por telfono llamando al [phone] y presione la opcin 4.

## 2021-09-15 NOTE — Progress Notes (Signed)
Follow-Up Visit   Subjective  Hannah Green is a 76 y.o. female who presents for the following: Follow-up (4 months f/u on the left cheek Ak treated with 5FU/Calcipotriene cream ). Pt c/o hair loss, thinning hair for ~1 year now, pt report she has a lot of stressors in her life.  Hx of Breast cancer Hx of lung cancer   The following portions of the chart were reviewed this encounter and updated as appropriate:   Tobacco  Allergies  Meds  Problems  Med Hx  Surg Hx  Fam Hx     Review of Systems:  No other skin or systemic complaints except as noted in HPI or Assessment and Plan.  Objective  Well appearing patient in no apparent distress; mood and affect are within normal limits.  A focused examination was performed including face,scalp. Relevant physical exam findings are noted in the Assessment and Plan.  left cheek Erythematous thin papules/macules with gritty scale.   Scalp Diffuse thinning of hair, positive hair pull test.    Assessment & Plan  AK (actinic keratosis) left cheek Persistent Actinic keratoses are precancerous spots that appear secondary to cumulative UV radiation exposure/sun exposure over time. They are chronic with expected duration over 1 year. A portion of actinic keratoses will progress to squamous cell carcinoma of the skin. It is not possible to reliably predict which spots will progress to skin cancer and so treatment is recommended to prevent development of skin cancer.  Recommend daily broad spectrum sunscreen SPF 30+ to sun-exposed areas, reapply every 2 hours as needed.  Recommend staying in the shade or wearing long sleeves, sun glasses (UVA+UVB protection) and wide brim hats (4-inch brim around the entire circumference of the hat). Call for new or changing lesions.  Actinic Damage - Severe, confluent actinic changes with pre-cancerous actinic keratoses  - Severe, chronic, not at goal, secondary to cumulative UV radiation exposure over  time - diffuse scaly erythematous macules and papules with underlying dyspigmentation - Discussed Prescription "Field Treatment" for Severe, Chronic Confluent Actinic Changes with Pre-Cancerous Actinic Keratoses Field treatment involves treatment of an entire area of skin that has confluent Actinic Changes (Sun/ Ultraviolet light damage) and PreCancerous Actinic Keratoses by method of PhotoDynamic Therapy (PDT) and/or prescription Topical Chemotherapy agents such as 5-fluorouracil, 5-fluorouracil/calcipotriene, and/or imiquimod.  The purpose is to decrease the number of clinically evident and subclinical PreCancerous lesions to prevent progression to development of skin cancer by chemically destroying early precancer changes that may or may not be visible.  It has been shown to reduce the risk of developing skin cancer in the treated area. As a result of treatment, redness, scaling, crusting, and open sores may occur during treatment course. One or more than one of these methods may be used and may have to be used several times to control, suppress and eliminate the PreCancerous changes. Discussed treatment course, expected reaction, and possible side effects. - Recommend daily broad spectrum sunscreen SPF 30+ to sun-exposed areas, reapply every 2 hours as needed.  - Staying in the shade or wearing long sleeves, sun glasses (UVA+UVB protection) and wide brim hats (4-inch brim around the entire circumference of the hat) are also recommended. - Call for new or changing lesions.  Related Medications fluorouracil (EFUDEX) 5 % cream Apply topically 2 (two) times daily. 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 Condition is symptomatic /  bothersome to patient. Not to goal. Likely related to lung disease and stress  Telogen effluvium is a benign, self-limited condition causing increased  hair shedding usually for several months. It does not progress to baldness, and the hair eventually grows back on its own. It can be triggered by recent illness, recent surgery, thyroid disease, low iron stores, vitamin D deficiency, fad diets or rapid weight loss, hormonal changes such as pregnancy or birth control pills, and some medication. Usually the hair loss starts 2-3 months after the illness or health change. Rarely, it can continue for longer than a year.  Labs reviewed from   Recommend labs  CBC with diff, CMP, thyroid panel- pt will go to her PCP to have her order these labs with additional yearly labs   Pending normal labs we will start Minoxidil tablets   Continue Biotin vitamins 2.5 mg daily    Return in about 4 months (around 01/16/2022) for Telogen .  IMarye Round, CMA, am acting as scribe for Sarina Ser, MD .  Documentation: I have reviewed the above documentation for accuracy and completeness, and I agree with the above.  Sarina Ser, MD

## 2021-09-24 ENCOUNTER — Encounter: Payer: Self-pay | Admitting: Dermatology

## 2021-09-28 ENCOUNTER — Telehealth: Payer: Self-pay

## 2021-09-28 NOTE — Telephone Encounter (Signed)
Called LMOVM please call here to let us know if or when she will have labs done so we can review due to hair loss

## 2021-10-06 ENCOUNTER — Telehealth: Payer: Self-pay | Admitting: Family Medicine

## 2021-10-06 DIAGNOSIS — E559 Vitamin D deficiency, unspecified: Secondary | ICD-10-CM

## 2021-10-06 DIAGNOSIS — E785 Hyperlipidemia, unspecified: Secondary | ICD-10-CM

## 2021-10-06 DIAGNOSIS — R7303 Prediabetes: Secondary | ICD-10-CM

## 2021-10-06 NOTE — Telephone Encounter (Signed)
-----   Message from Ellamae Sia sent at 09/27/2021  3:44 PM EDT ----- Regarding: Lab orders for Tuesday, 8.15.23 Patient is scheduled for CPX labs, please order future labs, Thanks , Karna Christmas

## 2021-10-12 ENCOUNTER — Other Ambulatory Visit (INDEPENDENT_AMBULATORY_CARE_PROVIDER_SITE_OTHER): Payer: Medicare HMO

## 2021-10-12 DIAGNOSIS — E785 Hyperlipidemia, unspecified: Secondary | ICD-10-CM

## 2021-10-12 DIAGNOSIS — E559 Vitamin D deficiency, unspecified: Secondary | ICD-10-CM

## 2021-10-12 DIAGNOSIS — R7303 Prediabetes: Secondary | ICD-10-CM | POA: Diagnosis not present

## 2021-10-12 LAB — COMPREHENSIVE METABOLIC PANEL
ALT: 13 U/L (ref 0–35)
AST: 15 U/L (ref 0–37)
Albumin: 3.8 g/dL (ref 3.5–5.2)
Alkaline Phosphatase: 58 U/L (ref 39–117)
BUN: 14 mg/dL (ref 6–23)
CO2: 29 mEq/L (ref 19–32)
Calcium: 9.3 mg/dL (ref 8.4–10.5)
Chloride: 102 mEq/L (ref 96–112)
Creatinine, Ser: 0.63 mg/dL (ref 0.40–1.20)
GFR: 86.3 mL/min (ref >60.00)
Glucose, Bld: 94 mg/dL (ref 70–99)
Potassium: 4.6 mEq/L (ref 3.5–5.1)
Sodium: 139 mEq/L (ref 135–145)
Total Bilirubin: 0.5 mg/dL (ref 0.2–1.2)
Total Protein: 6.4 g/dL (ref 6.0–8.3)

## 2021-10-12 LAB — LIPID PANEL
Cholesterol: 209 mg/dL — ABNORMAL HIGH (ref 0–200)
HDL: 62.7 mg/dL (ref >39.00)
LDL Cholesterol: 116 mg/dL — ABNORMAL HIGH (ref 0–99)
NonHDL: 146.51
Total CHOL/HDL Ratio: 3
Triglycerides: 154 mg/dL — ABNORMAL HIGH (ref 0.0–149.0)
VLDL: 30.8 mg/dL (ref 0.0–40.0)

## 2021-10-12 LAB — VITAMIN D 25 HYDROXY (VIT D DEFICIENCY, FRACTURES): VITD: 17.8 ng/mL — ABNORMAL LOW (ref 30.00–100.00)

## 2021-10-12 LAB — HEMOGLOBIN A1C: Hgb A1c MFr Bld: 5.8 % (ref 4.6–6.5)

## 2021-10-12 NOTE — Progress Notes (Signed)
No critical labs need to be addressed urgently. We will discuss labs in detail at upcoming office visit.   

## 2021-10-14 ENCOUNTER — Telehealth: Payer: Self-pay

## 2021-10-14 NOTE — Telephone Encounter (Signed)
Called pt to see when she would have labs drawn for Dr K to review, so we can re address her hair loss regimen.   Labs in chart dated 10/12/2021 for your review.

## 2021-10-18 ENCOUNTER — Inpatient Hospital Stay: Payer: Medicare HMO | Attending: Internal Medicine

## 2021-10-18 ENCOUNTER — Other Ambulatory Visit: Payer: Self-pay

## 2021-10-18 ENCOUNTER — Ambulatory Visit (HOSPITAL_COMMUNITY)
Admission: RE | Admit: 2021-10-18 | Discharge: 2021-10-18 | Disposition: A | Discharge status: Home / Self Care | Payer: Medicare HMO | Source: Ambulatory Visit | Attending: Internal Medicine | Admitting: Internal Medicine

## 2021-10-18 DIAGNOSIS — J432 Centrilobular emphysema: Secondary | ICD-10-CM | POA: Diagnosis not present

## 2021-10-18 DIAGNOSIS — C3490 Malignant neoplasm of unspecified part of unspecified bronchus or lung: Secondary | ICD-10-CM | POA: Diagnosis not present

## 2021-10-18 DIAGNOSIS — C3491 Malignant neoplasm of unspecified part of right bronchus or lung: Secondary | ICD-10-CM

## 2021-10-18 DIAGNOSIS — Z85118 Personal history of other malignant neoplasm of bronchus and lung: Secondary | ICD-10-CM | POA: Insufficient documentation

## 2021-10-18 DIAGNOSIS — R918 Other nonspecific abnormal finding of lung field: Secondary | ICD-10-CM | POA: Diagnosis not present

## 2021-10-18 DIAGNOSIS — Z853 Personal history of malignant neoplasm of breast: Secondary | ICD-10-CM | POA: Insufficient documentation

## 2021-10-18 LAB — CBC WITH DIFFERENTIAL (CANCER CENTER ONLY)
Abs Immature Granulocytes: 0.03 10*3/uL (ref 0.00–0.07)
Basophils Absolute: 0.1 10*3/uL (ref 0.0–0.1)
Basophils Relative: 1 %
Eosinophils Absolute: 0.1 10*3/uL (ref 0.0–0.5)
Eosinophils Relative: 2 %
HCT: 41.9 % (ref 36.0–46.0)
Hemoglobin: 14.3 g/dL (ref 12.0–15.0)
Immature Granulocytes: 0 %
Lymphocytes Relative: 32 %
Lymphs Abs: 2.4 10*3/uL (ref 0.7–4.0)
MCH: 31.2 pg (ref 26.0–34.0)
MCHC: 34.1 g/dL (ref 30.0–36.0)
MCV: 91.3 fL (ref 80.0–100.0)
Monocytes Absolute: 0.5 10*3/uL (ref 0.1–1.0)
Monocytes Relative: 7 %
Neutro Abs: 4.3 10*3/uL (ref 1.7–7.7)
Neutrophils Relative %: 58 %
Platelet Count: 296 10*3/uL (ref 150–400)
RBC: 4.59 MIL/uL (ref 3.87–5.11)
RDW: 13.2 % (ref 11.5–15.5)
WBC Count: 7.4 10*3/uL (ref 4.0–10.5)
nRBC: 0 % (ref 0.0–0.2)

## 2021-10-18 LAB — CMP (CANCER CENTER ONLY)
ALT: 14 U/L (ref 0–44)
AST: 17 U/L (ref 15–41)
Albumin: 4 g/dL (ref 3.5–5.0)
Alkaline Phosphatase: 58 U/L (ref 38–126)
Anion gap: 4 — ABNORMAL LOW (ref 5–15)
BUN: 14 mg/dL (ref 8–23)
CO2: 30 mmol/L (ref 22–32)
Calcium: 9.8 mg/dL (ref 8.9–10.3)
Chloride: 104 mmol/L (ref 98–111)
Creatinine: 0.69 mg/dL (ref 0.44–1.00)
GFR, Estimated: >60 mL/min (ref >60)
Glucose, Bld: 101 mg/dL — ABNORMAL HIGH (ref 70–99)
Potassium: 4.2 mmol/L (ref 3.5–5.1)
Sodium: 138 mmol/L (ref 135–145)
Total Bilirubin: 0.7 mg/dL (ref 0.3–1.2)
Total Protein: 6.7 g/dL (ref 6.5–8.1)

## 2021-10-18 MED ORDER — SODIUM CHLORIDE (PF) 0.9 % IJ SOLN
INTRAMUSCULAR | Status: AC
  Filled 2021-10-18: qty 50

## 2021-10-18 MED ORDER — IOHEXOL 300 MG/ML  SOLN
75.0000 mL | Freq: Once | INTRAMUSCULAR | Status: AC | PRN
  Administered 2021-10-18: 75 mL via INTRAVENOUS

## 2021-10-19 ENCOUNTER — Ambulatory Visit (INDEPENDENT_AMBULATORY_CARE_PROVIDER_SITE_OTHER): Payer: Medicare HMO | Admitting: Family Medicine

## 2021-10-19 ENCOUNTER — Encounter: Payer: Self-pay | Admitting: Family Medicine

## 2021-10-19 VITALS — BP 110/68 | HR 66 | Temp 98.6°F | Resp 16 | Ht 66.25 in | Wt 210.5 lb

## 2021-10-19 DIAGNOSIS — Z85118 Personal history of other malignant neoplasm of bronchus and lung: Secondary | ICD-10-CM | POA: Diagnosis not present

## 2021-10-19 DIAGNOSIS — E559 Vitamin D deficiency, unspecified: Secondary | ICD-10-CM

## 2021-10-19 DIAGNOSIS — M069 Rheumatoid arthritis, unspecified: Secondary | ICD-10-CM

## 2021-10-19 DIAGNOSIS — Z23 Encounter for immunization: Secondary | ICD-10-CM

## 2021-10-19 DIAGNOSIS — E785 Hyperlipidemia, unspecified: Secondary | ICD-10-CM

## 2021-10-19 DIAGNOSIS — Z1231 Encounter for screening mammogram for malignant neoplasm of breast: Secondary | ICD-10-CM

## 2021-10-19 DIAGNOSIS — Z0000 Encounter for general adult medical examination without abnormal findings: Secondary | ICD-10-CM

## 2021-10-19 DIAGNOSIS — E2839 Other primary ovarian failure: Secondary | ICD-10-CM

## 2021-10-19 DIAGNOSIS — J449 Chronic obstructive pulmonary disease, unspecified: Secondary | ICD-10-CM | POA: Diagnosis not present

## 2021-10-19 DIAGNOSIS — I5189 Other ill-defined heart diseases: Secondary | ICD-10-CM

## 2021-10-19 MED ORDER — ATORVASTATIN CALCIUM 10 MG PO TABS: 10.0000 mg | ORAL_TABLET | Freq: Every day | ORAL | 1 refills | Status: DC

## 2021-10-19 MED ORDER — VITAMIN D3 1.25 MG (50000 UT) PO CAPS: 1.0000 | ORAL_CAPSULE | ORAL | 3 refills | Status: DC

## 2021-10-19 MED ORDER — TRELEGY ELLIPTA 100-62.5-25 MCG/ACT IN AEPB: 1.0000 | INHALATION_SPRAY | Freq: Every day | RESPIRATORY_TRACT | 11 refills | Status: DC

## 2021-10-19 NOTE — Progress Notes (Signed)
Patient ID: Hannah Green, female    DOB: June 05, 1945, 76 y.o.   MRN: [id number]  This visit was conducted in person.  BP 110/68   Pulse 66   Temp 98.6 F (37 C)   Resp 16   Ht 5' 6.25" (1.683 m)   Wt 210 lb 8 oz (95.5 kg)   LMP  (LMP Unknown)   SpO2 97%   BMI 33.72 kg/m    CC:  Chief Complaint  Patient presents with   Medicare Wellness    Subjective:   HPI: Hannah Green is a 76 y.o. female presenting on 10/19/2021 for Medicare Wellness  The patient presents for annual medicare wellness, complete physical and review of chronic health problems. He/She also has the following acute concerns today: none  I have personally reviewed the Medicare Annual Wellness questionnaire and have noted 1. The patient's medical and social history 2. Their use of alcohol, tobacco or illicit drugs 3. Their current medications and supplements 4. The patient's functional ability including ADL's, fall risks, home safety risks and hearing or visual             impairment. 5. Diet and physical activities 6. Evidence for depression or mood disorders 7.         Updated provider list, Cognitive evaluation was performed and recorded on pt medicare questionnaire form. The patients weight, height, BMI and visual acuity have been recorded in the chart   I have made referrals, counseling and provided education to the patient based review of the above and I have provided the pt with a written personalized care plan for preventive services.   Documentation of this information was scanned into the electronic record under the media tab.   Advance directives and end of life planning reviewed in detail with patient and documented in EMR. Patient given handout on advance care directives if needed. HCPOA and living will updated if needed.  No falls in last 12 months.  [place] Visit from 10/19/2021 in [place] at [facility] Total Score 0      Vision Screening   Right  eye Left eye Both eyes  Without correction 20/13  20/15  With correction     Hearing Screening - Comments:: Has hearing aids  Vit D def: low again.Marland Kitchen on OTC supplement.    Elevated Cholesterol:  LDL not at goal,  no longer  on atorvastatin 10 mg daily, no SE. Lab Results  Component Value Date   CHOL 209 (H) 10/12/2021   HDL 62.70 10/12/2021   LDLCALC 116 (H) 10/12/2021   LDLDIRECT 136.2 06/03/2011   TRIG 154.0 (H) 10/12/2021   CHOLHDL 3 10/12/2021   The 10-year ASCVD risk score (Arnett DK, et al., 2019) is: 13.7%   Values used to calculate the score:     Age: 29 years     Sex: Female     Is Non-Hispanic African American: No     Diabetic: No     Tobacco smoker: No     Systolic Blood Pressure: 662 mmHg     Is BP treated: No     HDL Cholesterol: 62.7 mg/dL     Total Cholesterol: 209 mg/dL Using medications without problems: Muscle aches:  none Diet compliance: moderate Exercise: silver sneaker daily. Other complaints:  Wt Readings from Last 3 Encounters:  10/19/21 210 lb 8 oz (95.5 kg)  03/26/21 222 lb (100.7 kg)  12/10/20 243 lb 6.4 oz (110.4 kg)  RA:  Stable control on no medication.  HX of lung cancer, s/p  resection, Onc Dr. Mora Appl   COPD:  inadequate  control on   Breztri from Dr. Valeta Harms. She likes Trelegy  better... refills. Using albuterol prn.    Prediabetes  Lab Results  Component Value Date   HGBA1C 5.8 10/12/2021    Patient Care Team: Jinny Sanders, MD as PCP - General Leonie Man, MD as PCP - Cardiology (Cardiology) Thelma Comp, [place] as Consulting Physician (Optometry) Vevelyn Royals, MD as Consulting Physician (Ophthalmology) Carloyn Manner, MD as Referring Physician (Otolaryngology) Deneise Lever, MD as Consulting Physician (Pulmonary Disease)   Relevant past medical, surgical, family and social history reviewed and updated as indicated. Interim medical history since our last visit reviewed. Allergies and medications  reviewed and updated. Outpatient Medications Prior to Visit  Medication Sig Dispense Refill   albuterol (VENTOLIN HFA) 108 (90 Base) MCG/ACT inhaler TAKE 2 PUFFS BY MOUTH EVERY 6 HOURS AS NEEDED FOR WHEEZE OR SHORTNESS OF BREATH 8.5 g 3   aspirin EC 81 MG tablet Take 1 tablet (81 mg total) by mouth daily. 90 tablet 3   atorvastatin (LIPITOR) 10 MG tablet TAKE 1 TABLET BY MOUTH EVERY DAY 90 tablet 0   benzonatate (TESSALON) 100 MG capsule Take 1 capsule (100 mg total) by mouth 3 (three) times daily as needed for cough. 21 capsule 0   cefdinir (OMNICEF) 300 MG capsule Take 1 capsule (300 mg total) by mouth 2 (two) times daily. 20 capsule 0   cholecalciferol (VITAMIN D3) 25 MCG (1000 UT) tablet Take 1,000 Units by mouth daily.     fluorouracil (EFUDEX) 5 % cream Apply topically 2 (two) times daily. Apply to affected area of left medial cheek x 1 week 15 g 0   fluticasone (FLONASE) 50 MCG/ACT nasal spray Place 1 spray into both nostrils daily. (Patient not taking: Reported on 03/26/2021) 16 g 0   Fluticasone-Umeclidin-Vilant (TRELEGY ELLIPTA) 100-62.5-25 MCG/INH AEPB TAKE 1 PUFF BY MOUTH EVERY DAY 60 each 2   omeprazole (PRILOSEC) 20 MG capsule 1 cap(s) orally once a day for 90 days     sodium chloride (OCEAN) 0.65 % SOLN nasal spray Place 1 spray into both nostrils as needed for congestion.  0   No facility-administered medications prior to visit.     Per HPI unless specifically indicated in ROS section below Review of Systems  Constitutional:  Negative for fatigue and fever.  HENT:  Negative for congestion.   Eyes:  Negative for pain.  Respiratory:  Positive for shortness of breath. Negative for cough.   Cardiovascular:  Negative for chest pain, palpitations and leg swelling.  Gastrointestinal:  Negative for abdominal pain.  Genitourinary:  Negative for dysuria and vaginal bleeding.  Musculoskeletal:  Negative for back pain.  Neurological:  Negative for syncope, light-headedness and  headaches.  Psychiatric/Behavioral:  Negative for dysphoric mood.    Objective:  BP 110/68   Pulse 66   Temp 98.6 F (37 C)   Resp 16   Ht 5' 6.25" (1.683 m)   Wt 210 lb 8 oz (95.5 kg)   LMP  (LMP Unknown)   SpO2 97%   BMI 33.72 kg/m   Wt Readings from Last 3 Encounters:  10/19/21 210 lb 8 oz (95.5 kg)  03/26/21 222 lb (100.7 kg)  12/10/20 243 lb 6.4 oz (110.4 kg)      Physical Exam Constitutional:      General: She is not in acute  distress.    Appearance: Normal appearance. She is well-developed. She is not ill-appearing or toxic-appearing.  HENT:     Head: Normocephalic.     Right Ear: Hearing, tympanic membrane, ear canal and external ear normal. Tympanic membrane is not erythematous, retracted or bulging.     Left Ear: Hearing, tympanic membrane, ear canal and external ear normal. Tympanic membrane is not erythematous, retracted or bulging.     Nose: No mucosal edema or rhinorrhea.     Right Sinus: No maxillary sinus tenderness or frontal sinus tenderness.     Left Sinus: No maxillary sinus tenderness or frontal sinus tenderness.     Mouth/Throat:     Pharynx: Uvula midline.  Eyes:     General: Lids are normal. Lids are everted, no foreign bodies appreciated.     Conjunctiva/sclera: Conjunctivae normal.     Pupils: Pupils are equal, round, and reactive to light.  Neck:     Thyroid: No thyroid mass or thyromegaly.     Vascular: No carotid bruit.     Trachea: Trachea normal.  Cardiovascular:     Rate and Rhythm: Normal rate and regular rhythm.     Pulses: Normal pulses.     Heart sounds: Normal heart sounds, S1 normal and S2 normal. No murmur heard.    No friction rub. No gallop.  Pulmonary:     Effort: Pulmonary effort is normal. No tachypnea or respiratory distress.     Breath sounds: Normal breath sounds. No decreased breath sounds, wheezing, rhonchi or rales.  Abdominal:     General: Bowel sounds are normal.     Palpations: Abdomen is soft.     Tenderness:  There is no abdominal tenderness.  Musculoskeletal:     Cervical back: Normal range of motion and neck supple.  Skin:    General: Skin is warm and dry.     Findings: No rash.  Neurological:     Mental Status: She is alert.  Psychiatric:        Mood and Affect: Mood is not anxious or depressed.        Speech: Speech normal.        Behavior: Behavior normal. Behavior is cooperative.        Thought Content: Thought content normal.        Judgment: Judgment normal.       Results for orders placed or performed in visit on 10/18/21  CMP ([place] [facility] only)  Result Value Ref Range   Sodium 138 135 - 145 mmol/L   Potassium 4.2 3.5 - 5.1 mmol/L   Chloride 104 98 - 111 mmol/L   CO2 30 22 - 32 mmol/L   Glucose, Bld 101 (H) 70 - 99 mg/dL   BUN 14 8 - 23 mg/dL   Creatinine 0.69 0.44 - 1.00 mg/dL   Calcium 9.8 8.9 - 10.3 mg/dL   Total Protein 6.7 6.5 - 8.1 g/dL   Albumin 4.0 3.5 - 5.0 g/dL   AST 17 15 - 41 U/L   ALT 14 0 - 44 U/L   Alkaline Phosphatase 58 38 - 126 U/L   Total Bilirubin 0.7 0.3 - 1.2 mg/dL   GFR, Estimated >60 >60 mL/min   Anion gap 4 (L) 5 - 15  CBC with Differential ([facility] Only)  Result Value Ref Range   WBC Count 7.4 4.0 - 10.5 K/uL   RBC 4.59 3.87 - 5.11 MIL/uL   Hemoglobin 14.3 12.0 - 15.0 g/dL   HCT 41.9  36.0 - 46.0 %   MCV 91.3 80.0 - 100.0 fL   MCH 31.2 26.0 - 34.0 pg   MCHC 34.1 30.0 - 36.0 g/dL   RDW 13.2 11.5 - 15.5 %   Platelet Count 296 150 - 400 K/uL   nRBC 0.0 0.0 - 0.2 %   Neutrophils Relative % 58 %   Neutro Abs 4.3 1.7 - 7.7 K/uL   Lymphocytes Relative 32 %   Lymphs Abs 2.4 0.7 - 4.0 K/uL   Monocytes Relative 7 %   Monocytes Absolute 0.5 0.1 - 1.0 K/uL   Eosinophils Relative 2 %   Eosinophils Absolute 0.1 0.0 - 0.5 K/uL   Basophils Relative 1 %   Basophils Absolute 0.1 0.0 - 0.1 K/uL   Immature Granulocytes 0 %   Abs Immature Granulocytes 0.03 0.00 - 0.07 K/uL     COVID 19 screen:  No recent travel or known exposure to  COVID19 The patient denies respiratory symptoms of COVID 19 at this time. The importance of social distancing was discussed today.   Assessment and Plan   The patient's preventative maintenance and recommended screening tests for an annual wellness exam were reviewed in full today. Brought up to date unless services declined.  Counselled on the importance of diet, exercise, and its role in overall health and mortality. The patient's FH and SH was reviewed, including their home life, tobacco status, and drug and alcohol status.    Vaccines:  PCV 13, 23 uptodate.  COVID x3.  Due for shingles ...will consider. Mammo:  06/19/19 Hx of breast CA, repeat due Last pap age 26, no further indicated,  No DVE , low risk, no family history of ovarian or uterine cancer. DEXA: 12/2015, plan repeat in 2022 Colon: Last 02/2010 nml,  PER  Dr. Lorie Apley last note: Repeat in 5 years. Had to reschedule... postpone for now... she refuses 2021..  Cologuard 2022 negative Hep C: neg Former smoker, >40 years:  Hx of lung cancer  spirometry 2020 severe COPD followed by pulmonary, Dr. Annamaria Boots  Problem List Items Addressed This Visit     COPD (chronic obstructive pulmonary disease) ([place]) (Chronic)    Chronic,inadequate  control on   Breztri from Dr. Valeta Harms. She likes Trelegy  better... refills. Using albuterol prn.      Relevant Medications   Fluticasone-Umeclidin-Vilant (TRELEGY ELLIPTA) 100-62.5-25 MCG/ACT AEPB   Diastolic dysfunction without heart failure (Chronic)   Hyperlipidemia with target LDL less than 100 (Chronic)    Chronic, LDL no longer at goal  OFF atorvastatin 10 mg p.o. daily. She will restart atorvastatin and work on low-cholesterol diet, regular exercise and weight loss.       Relevant Medications   atorvastatin (LIPITOR) 10 MG tablet   Other Relevant Orders   Lipid panel   Comprehensive metabolic panel   Rheumatoid arthritis (HCC) (Chronic)    Chronic, stable control on no medication       Vitamin D deficiency (Chronic)    Replace with prescription supplement.      History of lung cancer    Status postresection, oncologist is Dr. Mora Appl.      Other Visit Diagnoses     Medicare annual wellness visit, subsequent    -  Primary   Screening mammogram for breast cancer       Relevant Orders   MM DIGITAL SCREENING BILATERAL   Estrogen deficiency       Relevant Orders   DG Bone Density   Need for influenza vaccination  Relevant Orders   Flu Vaccine QUAD 6+ mos PF IM (Fluarix Quad PF) (Completed)        Eliezer Lofts, MD

## 2021-10-19 NOTE — Patient Instructions (Addendum)
Restart atorvastatin 10  daily.  Work on low cholesterol diet. Please call the location of your choice from the menu below to schedule your Mammogram and/or Bone Density appointment.    [facility]                      Phone:  [phone], [place] [address]                                                             Services: Traditional and 3D Mammogram, [place] [facility]ensity                 Phone: [phone] [address], [place] [address]    Service: Bone Density ONLY   *this site does NOT perform mammograms  [facility]                        Phone:  [phone] [address]                                  [place], [place] [address]                                            Services:  3D Mammogram and Bone Density

## 2021-10-20 ENCOUNTER — Encounter: Payer: Self-pay | Admitting: Internal Medicine

## 2021-10-20 ENCOUNTER — Other Ambulatory Visit: Payer: Self-pay

## 2021-10-20 ENCOUNTER — Inpatient Hospital Stay: Payer: Medicare HMO | Admitting: Internal Medicine

## 2021-10-20 VITALS — BP 174/54 | HR 57 | Temp 98.2°F | Resp 16 | Wt 210.1 lb

## 2021-10-20 DIAGNOSIS — Z853 Personal history of malignant neoplasm of breast: Secondary | ICD-10-CM | POA: Diagnosis not present

## 2021-10-20 DIAGNOSIS — C3490 Malignant neoplasm of unspecified part of unspecified bronchus or lung: Secondary | ICD-10-CM | POA: Diagnosis not present

## 2021-10-20 DIAGNOSIS — Z85118 Personal history of other malignant neoplasm of bronchus and lung: Secondary | ICD-10-CM | POA: Diagnosis not present

## 2021-10-20 DIAGNOSIS — J432 Centrilobular emphysema: Secondary | ICD-10-CM | POA: Diagnosis not present

## 2021-10-20 NOTE — Progress Notes (Signed)
[facility] Telephone:([phone]   Fax:(336) [phone]  OFFICE PROGRESS NOTE  Jinny Sanders, MD [place] [address]  DIAGNOSIS: Stage IB (T2a, N0, M0) non-small cell lung cancer, squamous cell carcinoma presented with right upper lobe nodule  PRIOR THERAPY:  Status post right upper and right middle lobe bilobectomy as well as lymph node dissections under the care of Dr. Roxan Hockey on April 23, 2018.  The tumor was 1.7 cm in size but there was evidence for visceropleural involvement.  CURRENT THERAPY: Observation.  INTERVAL HISTORY: Hannah Green 76 y.o. female returns to the clinic today for follow-up visit.  The patient is feeling fine today with no concerning complaints except for shortness of breath with exertion especially with the recent humidity and weather.  She denied having any current chest pain, cough or hemoptysis.  She has no nausea, vomiting, diarrhea or constipation.  She intentionally lost over 20 pounds recently.  She denied having any fever or chills.  She is here today for evaluation with repeat CT scan of the chest for restaging of her disease.  MEDICAL HISTORY: Past Medical History:  Diagnosis Date   Breast cancer [facility]) 1999   Cataract 2017   resolved with surgery   COPD (chronic obstructive pulmonary disease) ([place])    Coronary artery disease    Dysplastic nevus 09/04/2012   R prox calf - mild    GERD (gastroesophageal reflux disease)    Hyperlipidemia    Postoperative atrial fibrillation ([place]) 03/2018   Rheumatoid arthritis(714.0)    Squamous cell carcinoma of lung, stage I, right ([place]) 2020   Status post right upper and middle lobectomy    ALLERGIES:  is allergic to adhesive [tape], neosporin [neomycin-bacitracin zn-polymyx], and penicillins.  MEDICATIONS:  Current Outpatient Medications  Medication Sig Dispense Refill   albuterol (VENTOLIN HFA) 108 (90 Base) MCG/ACT inhaler TAKE 2 PUFFS BY MOUTH  EVERY 6 HOURS AS NEEDED FOR WHEEZE OR SHORTNESS OF BREATH 8.5 g 3   aspirin EC 81 MG tablet Take 1 tablet (81 mg total) by mouth daily. 90 tablet 3   atorvastatin (LIPITOR) 10 MG tablet Take 1 tablet (10 mg total) by mouth daily. 30 tablet 1   Cholecalciferol (VITAMIN D3) 1.25 MG (50000 UT) CAPS Take 1 capsule by mouth once a week. 12 capsule 3   fluorouracil (EFUDEX) 5 % cream Apply topically 2 (two) times daily. Apply to affected area of left medial cheek x 1 week 15 g 0   Fluticasone-Umeclidin-Vilant (TRELEGY ELLIPTA) 100-62.5-25 MCG/ACT AEPB Inhale 1 puff into the lungs daily. 1 each 11   No current facility-administered medications for this visit.    SURGICAL HISTORY:  Past Surgical History:  Procedure Laterality Date   BREAST LUMPECTOMY  1999   left, needed radiation   CATARACT EXTRACTION W/ INTRAOCULAR LENS  IMPLANT, BILATERAL Bilateral 05/19/15, 06/02/15   CHEST TUBE INSERTION Right 04/23/2018   Procedure: Right Lateral Chest Tube Insertion;  Surgeon: Melrose Nakayama, MD;  Location[facility] OR;  Service: Thoracic;  Laterality: Right;   CHOLECYSTECTOMY     LOBECTOMY Right 04/23/2018   Procedure: RIGHT UPPER LOBECTOMY;  Surgeon: Melrose Nakayama, MD;  Location: [facility];  Service: Thoracic;  Laterality: Right;   NM MYOVIEW LTD  01/2016   LOW RISK. Hypertensive response to exercise followed by significant drop (stage I pressure 211/70 drop to 118/75. Stage II). Apical artifact but otherwise no ischemia or infarction. EF 64%.  NODE DISSECTION Right 04/23/2018   Procedure: MEDIASTINAL NODE DISSECTION;  Surgeon: Melrose Nakayama, MD;  Location: [facility] OR;  Service: Thoracic;  Laterality: Right;   RIGHT/LEFT HEART CATH AND CORONARY ANGIOGRAPHY N/A 04/10/2018   Procedure: RIGHT/LEFT HEART CATH AND CORONARY ANGIOGRAPHY;  Surgeon: Leonie Man, MD;  Location: [facility] CV LAB;; angiographically minimal coronary artery disease.  Normal LVEDP.  Basely normal RHC pressures with no suggestion  of pulmonary hypertension.  Normal CO-CI by Fick (5.9-2.8)   TONSILLECTOMY     TRANSTHORACIC ECHOCARDIOGRAM  11/'17; 2/'20   a) Nov 2017: Mild LVH. EF 60-65%. GR 2-DD; b) Feb 2020: Normal LV size and function.  EF 60 to 65%.  No R WMA.  GR 1 DD.  Normal RV size and function.  Normal pressures.  No valvular disease.   VIDEO ASSISTED THORACOSCOPY (VATS)/WEDGE RESECTION Right 04/23/2018   Procedure: VIDEO ASSISTED THORACOSCOPY (VATS)/WEDGE RESECTION OF RIGHT UPPER LUNG;  Surgeon: Melrose Nakayama, MD;  Location: [facility] OR;  Service: Thoracic;  Laterality: Right;    REVIEW OF SYSTEMS:  A comprehensive review of systems was negative except for: Respiratory: positive for dyspnea on exertion   PHYSICAL EXAMINATION: General appearance: alert, cooperative, and no distress Head: Normocephalic, without obvious abnormality, atraumatic Neck: no adenopathy, no JVD, supple, symmetrical, trachea midline, and thyroid not enlarged, symmetric, no tenderness/mass/nodules Lymph nodes: Cervical, supraclavicular, and axillary nodes normal. Resp: clear to auscultation bilaterally Back: symmetric, no curvature. ROM normal. No CVA tenderness. Cardio: regular rate and rhythm, S1, S2 normal, no murmur, click, rub or gallop GI: soft, non-tender; bowel sounds normal; no masses,  no organomegaly Extremities: extremities normal, atraumatic, no cyanosis or edema  ECOG PERFORMANCE STATUS: 1 - Symptomatic but completely ambulatory  Blood pressure (!) 174/54, pulse (!) 57, temperature 98.2 F (36.8 C), temperature source Oral, resp. rate 16, weight 210 lb 2 oz (95.3 kg), SpO2 100 %.  LABORATORY DATA: Lab Results  Component Value Date   WBC 7.4 10/18/2021   HGB 14.3 10/18/2021   HCT 41.9 10/18/2021   MCV 91.3 10/18/2021   PLT 296 10/18/2021      Chemistry      Component Value Date/Time   NA 138 10/18/2021 1310   NA 140 04/04/2018 1643   K 4.2 10/18/2021 1310   CL 104 10/18/2021 1310   CO2 30 10/18/2021 1310    BUN 14 10/18/2021 1310   BUN 13 04/04/2018 1643   CREATININE 0.69 10/18/2021 1310   CREATININE 0.61 06/01/2012 1452      Component Value Date/Time   CALCIUM 9.8 10/18/2021 1310   ALKPHOS 58 10/18/2021 1310   AST 17 10/18/2021 1310   ALT 14 10/18/2021 1310   BILITOT 0.7 10/18/2021 1310       RADIOGRAPHIC STUDIES: CT Chest W Contrast  Result Date: 10/19/2021 CLINICAL DATA:  Stage IB non-small cell right upper lobe lung cancer status post right upper and right middle lobectomies 04/23/2018. Interval observation. Restaging. EXAM: CT CHEST WITH CONTRAST TECHNIQUE: Multidetector CT imaging of the chest was performed during intravenous contrast administration. RADIATION DOSE REDUCTION: This exam was performed according to the departmental dose-optimization program which includes automated exposure control, adjustment of the mA and/or kV according to patient size and/or use of iterative reconstruction technique. CONTRAST:  39mL OMNIPAQUE IOHEXOL 300 MG/ML  SOLN COMPARISON:  07/13/2020 chest CT. FINDINGS: Cardiovascular: Normal heart size. No significant pericardial effusion/thickening. Left anterior descending coronary atherosclerosis. Atherosclerotic nonaneurysmal thoracic aorta. Normal caliber pulmonary arteries. No central pulmonary emboli. Mediastinum/Nodes:  No discrete thyroid nodules. Unremarkable esophagus. No pathologically enlarged axillary, mediastinal or hilar lymph nodes. Lungs/Pleura: No pneumothorax. No pleural effusion. Status post right upper lobectomy and right middle lobectomy. Mild paraseptal and centrilobular emphysema. No acute consolidative airspace disease or lung masses. A few scattered tiny left upper lobe solid pulmonary nodules, largest 0.3 cm (series 5/image 55), all stable. Tiny 0.2 cm right upper lobe nodule (series 5/image 46), stable. No new significant pulmonary nodules. Stable subcentimeter calcified right lung base granuloma. Upper abdomen: Moderate hiatal hernia.  Cholecystectomy. Left colonic diverticulosis. Subcentimeter hypodense upper left renal cortical lesion is too small to characterize, for which no follow-up imaging is recommended. Musculoskeletal: No aggressive appearing focal osseous lesions. Mild thoracic spondylosis. IMPRESSION: 1. No evidence of local tumor recurrence status post right upper lobectomy and right middle lobectomy. 2. No evidence of metastatic disease in the chest. 3. Moderate hiatal hernia. 4. One vessel coronary atherosclerosis. 5. Aortic Atherosclerosis (ICD10-I70.0) and Emphysema (ICD10-J43.9). Electronically Signed   By: Ilona Sorrel M.D.   On: 10/19/2021 21:51     ASSESSMENT AND PLAN: This is a very pleasant 76 years old white female with a stage Ib non-small cell lung cancer status post right upper and middle lobectomies with lymph node dissection in February 2020.   The patient has been on observation since that time and she is feeling fine with no concerning complaints except for shortness of breath with exertion. She had repeat CT scan of the chest performed recently.  I personally and independently reviewed the scan and discussed the result with the patient today. Her scan showed no concerning findings for disease recurrence or metastasis. I recommended for the patient to continue on observation with repeat CT scan of the chest in 1 year. The patient was advised to call immediately if she has any other concerning symptoms in the interval. All questions were answered. The patient knows to call the clinic with any problems, questions or concerns. We can certainly see the patient much sooner if necessary.   Disclaimer: This note was dictated with voice recognition software. Similar sounding words can inadvertently be transcribed and may not be corrected upon review.

## 2021-11-02 ENCOUNTER — Other Ambulatory Visit: Payer: Self-pay

## 2021-11-02 MED ORDER — MINOXIDIL 2.5 MG PO TABS
ORAL_TABLET | ORAL | 1 refills | Status: DC
Start: 2021-11-02 — End: 2022-01-11

## 2021-11-02 NOTE — Telephone Encounter (Signed)
Called pt discussed labs labs reviewed 10/18/2021 okay, start Minoxidil 2.5 mg Take 1/2 pill qd for hair loss --Stop medicine if ankle swelling or other side effects.  Return to the office as scheduled Nov 2023.   Minoxidil 2.5 mg #30 1 RF erx'd to [facility]

## 2021-11-03 ENCOUNTER — Telehealth: Payer: Self-pay

## 2021-11-03 NOTE — Patient Outreach (Signed)
  Care Coordination   11/03/2021 Name: MANIE BEALER MRN: [id number] DOB: 1945-07-07   Care Coordination Outreach Attempts:  An unsuccessful telephone outreach was attempted today to offer the patient information about available care coordination services as a benefit of their health plan.   Follow Up Plan:  Additional outreach attempts will be made to offer the patient care coordination information and services.   Encounter Outcome:  No Answer  Care Coordination Interventions Activated:  No   Care Coordination Interventions:  No, not indicated     Enzo Montgomery, RN,BSN,CCM [facility] Telephonic Care Management Coordinator Direct Phone: [phone] Toll Free: [phone] Fax: [phone]

## 2021-11-08 ENCOUNTER — Telehealth: Payer: Self-pay

## 2021-11-08 NOTE — Patient Outreach (Signed)
  Care Coordination   11/08/2021 Name: Hannah Green MRN: [id number] DOB: [birth year]   Care Coordination Outreach Attempts:  A second unsuccessful outreach was attempted today to offer the patient with information about available care coordination services as a benefit of their health plan.     Follow Up Plan:  Additional outreach attempts will be made to offer the patient care coordination information and services.   Encounter Outcome:  No Answer  Care Coordination Interventions Activated:  No   Care Coordination Interventions:  No, not indicated     Enzo Montgomery, RN,BSN,CCM [facility] Telephonic Care Management Coordinator Direct Phone: [phone] Toll Free: [phone] Fax: [phone]

## 2021-11-08 NOTE — Assessment & Plan Note (Signed)
Chronic, stable control on no medication

## 2021-11-08 NOTE — Assessment & Plan Note (Signed)
Status postresection, oncologist is Dr. Mora Appl.

## 2021-11-08 NOTE — Assessment & Plan Note (Signed)
Chronic,inadequate  control on   Breztri from Dr. Valeta Harms. She likes Trelegy  better... refills. Using albuterol prn.

## 2021-11-08 NOTE — Assessment & Plan Note (Signed)
Chronic, LDL no longer at goal  OFF atorvastatin 10 mg p.o. daily. She will restart atorvastatin and work on low-cholesterol diet, regular exercise and weight loss.

## 2021-11-08 NOTE — Assessment & Plan Note (Addendum)
Replace with prescription supplement.

## 2021-11-11 ENCOUNTER — Other Ambulatory Visit: Payer: Self-pay | Admitting: Family Medicine

## 2021-11-15 ENCOUNTER — Telehealth: Payer: Self-pay

## 2021-11-15 NOTE — Patient Outreach (Signed)
  Care Coordination   11/15/2021 Name: Hannah Green MRN: [id number] DOB: 11-Apr-1945   Care Coordination Outreach Attempts:  A third unsuccessful outreach was attempted today to offer the patient with information about available care coordination services as a benefit of their health plan.   Follow Up Plan:  No further outreach attempts will be made at this time. We have been unable to contact the patient to offer or enroll patient in care coordination services  Encounter Outcome:  No Answer  Care Coordination Interventions Activated:  No   Care Coordination Interventions:  No, not indicated      Enzo Montgomery, RN,BSN,CCM [facility] Telephonic Care Management Coordinator Direct Phone: [phone] Toll Free: [phone] Fax: [phone]

## 2021-12-07 DIAGNOSIS — H9201 Otalgia, right ear: Secondary | ICD-10-CM | POA: Diagnosis not present

## 2021-12-07 DIAGNOSIS — H6063 Unspecified chronic otitis externa, bilateral: Secondary | ICD-10-CM | POA: Diagnosis not present

## 2021-12-07 DIAGNOSIS — H903 Sensorineural hearing loss, bilateral: Secondary | ICD-10-CM | POA: Diagnosis not present

## 2021-12-17 ENCOUNTER — Other Ambulatory Visit: Payer: Self-pay | Admitting: Family Medicine

## 2021-12-22 ENCOUNTER — Telehealth: Payer: Self-pay

## 2021-12-22 NOTE — Telephone Encounter (Signed)
Pt walked in; pt has been at [facility] when got sick; for 2 wks pt has prod cough with green phlegm, now wheezing on and off, aches all over, S/T with painful swallowing started today. Pt did covid test that was neg on 12/15/21.pt has taken OTC mucinex and antihistamine without improvement. Pt has no CP, H/A or dizziness. Pt has slightly more SOB than usual. Pt had hx of lung CA and has 2 lobes removed. T 97.6 P 76 pulse ox room air 97% and BP 156/78 sitting lt arm lge cuff. No available appts at [facility] or [facility]. Offered pt an appt at [facility] but pt declined does not like traveling doctors or nurses. Pt prefers to only see Dr Diona Browner.I advised pt with hx of lung CA and surgery removal of 2 lobes should be seen ASAP.pt said she does not feel in any distress. Pt scheduled appt with Dr Diona Browner on 12/23/21 at 11:40 with UC & ED precautions given and pt voiced understanding. Sending note to Dr Diona Browner and Butch Penny CMA.

## 2021-12-22 NOTE — Telephone Encounter (Signed)
Noted  

## 2021-12-23 ENCOUNTER — Ambulatory Visit (INDEPENDENT_AMBULATORY_CARE_PROVIDER_SITE_OTHER): Payer: Medicare HMO | Admitting: Family Medicine

## 2021-12-23 VITALS — BP 138/70 | HR 69 | Temp 97.0°F | Ht 66.25 in | Wt 217.0 lb

## 2021-12-23 DIAGNOSIS — J441 Chronic obstructive pulmonary disease with (acute) exacerbation: Secondary | ICD-10-CM | POA: Diagnosis not present

## 2021-12-23 DIAGNOSIS — Z9842 Cataract extraction status, left eye: Secondary | ICD-10-CM | POA: Diagnosis not present

## 2021-12-23 DIAGNOSIS — Z9841 Cataract extraction status, right eye: Secondary | ICD-10-CM | POA: Diagnosis not present

## 2021-12-23 DIAGNOSIS — H18523 Epithelial (juvenile) corneal dystrophy, bilateral: Secondary | ICD-10-CM | POA: Diagnosis not present

## 2021-12-23 DIAGNOSIS — H5212 Myopia, left eye: Secondary | ICD-10-CM | POA: Diagnosis not present

## 2021-12-23 MED ORDER — VITAMIN D3 1.25 MG (50000 UT) PO CAPS: 1.0000 | ORAL_CAPSULE | ORAL | 3 refills | Status: DC

## 2021-12-23 MED ORDER — AZITHROMYCIN 250 MG PO TABS: ORAL_TABLET | ORAL | 0 refills | Status: DC

## 2021-12-23 MED ORDER — PREDNISONE 20 MG PO TABS: ORAL_TABLET | ORAL | 0 refills | Status: DC

## 2021-12-23 NOTE — Progress Notes (Signed)
Patient ID: Hannah Green, female    DOB: 01/27/1946, 76 y.o.   MRN: [id number]  This visit was conducted in person.  BP 138/70   Pulse 69   Temp (!) 97 F (36.1 C) (Temporal)   Ht 5' 6.25" (1.683 m)   Wt 217 lb (98.4 kg)   LMP  (LMP Unknown)   SpO2 97%   BMI 34.76 kg/m    CC:  Chief Complaint  Patient presents with   Wheezing   Cough    Productive with green mucous x 3 weeks. Negative covid test on 12/15/21   Sore Throat    X 3 days    Fatigue    Subjective:   HPI: Hannah Green is a 76 y.o. female presenting on 12/23/2021 for Wheezing, Cough (Productive with green mucous x 3 weeks. Negative covid test on 12/15/21), Sore Throat (X 3 days ), and Fatigue   Ongoing symptoms for 3 weeks.  Initial symptoms started with   runny nose... progressed to productive cough.  Moved to chest congestion.   Last week noted green mucus color change. No blood.  No fever.   She has had increase SOB and wheeze.  Using albuterol several times daily.   Using Mucinex OTC, and other OTC med.   Hx of COPD, lung resection for lung cancer  in 2020  On Trelegy and using albuterol as needed    COVID testing  on day 6 of illness.   Relevant past medical, surgical, family and social history reviewed and updated as indicated. Interim medical history since our last visit reviewed. Allergies and medications reviewed and updated. Outpatient Medications Prior to Visit  Medication Sig Dispense Refill   albuterol (VENTOLIN HFA) 108 (90 Base) MCG/ACT inhaler TAKE 2 PUFFS BY MOUTH EVERY 6 HOURS AS NEEDED FOR WHEEZE OR SHORTNESS OF BREATH 8.5 g 3   aspirin EC 81 MG tablet Take 1 tablet (81 mg total) by mouth daily. 90 tablet 3   atorvastatin (LIPITOR) 10 MG tablet TAKE 1 TABLET BY MOUTH EVERY DAY 90 tablet 0   Cholecalciferol (VITAMIN D3) 1.25 MG (50000 UT) CAPS Take 1 capsule by mouth once a week. 12 capsule 3   Fluticasone-Umeclidin-Vilant (TRELEGY ELLIPTA) 100-62.5-25 MCG/ACT AEPB Inhale 1  puff into the lungs daily. 1 each 11   minoxidil (LONITEN) 2.5 MG tablet Take 1/2 tablet daily 30 tablet 1   fluorouracil (EFUDEX) 5 % cream Apply topically 2 (two) times daily. Apply to affected area of left medial cheek x 1 week 15 g 0   No facility-administered medications prior to visit.     Per HPI unless specifically indicated in ROS section below Review of Systems  Constitutional:  Positive for fatigue. Negative for fever.  HENT:  Positive for congestion.   Eyes:  Negative for pain.  Respiratory:  Positive for cough, shortness of breath and wheezing.   Cardiovascular:  Negative for chest pain, palpitations and leg swelling.  Gastrointestinal:  Negative for abdominal pain.  Genitourinary:  Negative for dysuria and vaginal bleeding.  Musculoskeletal:  Negative for back pain.  Neurological:  Negative for syncope, light-headedness and headaches.  Psychiatric/Behavioral:  Negative for dysphoric mood.    Objective:  BP 138/70   Pulse 69   Temp (!) 97 F (36.1 C) (Temporal)   Ht 5' 6.25" (1.683 m)   Wt 217 lb (98.4 kg)   LMP  (LMP Unknown)   SpO2 97%   BMI 34.76 kg/m   Wt Readings from  Last 3 Encounters:  12/23/21 217 lb (98.4 kg)  10/20/21 210 lb 2 oz (95.3 kg)  10/19/21 210 lb 8 oz (95.5 kg)      Physical Exam Constitutional:      General: She is not in acute distress.    Appearance: Normal appearance. She is well-developed. She is not ill-appearing or toxic-appearing.  HENT:     Head: Normocephalic.     Right Ear: Hearing, tympanic membrane, ear canal and external ear normal. Tympanic membrane is not erythematous, retracted or bulging.     Left Ear: Hearing, tympanic membrane, ear canal and external ear normal. Tympanic membrane is not erythematous, retracted or bulging.     Nose: Rhinorrhea present. No mucosal edema.     Right Sinus: No maxillary sinus tenderness or frontal sinus tenderness.     Left Sinus: No maxillary sinus tenderness or frontal sinus tenderness.      Mouth/Throat:     Pharynx: Uvula midline.  Eyes:     General: Lids are normal. Lids are everted, no foreign bodies appreciated.     Conjunctiva/sclera: Conjunctivae normal.     Pupils: Pupils are equal, round, and reactive to light.  Neck:     Thyroid: No thyroid mass or thyromegaly.     Vascular: No carotid bruit.     Trachea: Trachea normal.  Cardiovascular:     Rate and Rhythm: Normal rate and regular rhythm.     Pulses: Normal pulses.     Heart sounds: Normal heart sounds, S1 normal and S2 normal. No murmur heard.    No friction rub. No gallop.  Pulmonary:     Effort: Pulmonary effort is normal. No tachypnea or respiratory distress.     Breath sounds: Normal breath sounds. No decreased breath sounds, wheezing, rhonchi or rales.  Abdominal:     General: Bowel sounds are normal.     Palpations: Abdomen is soft.     Tenderness: There is no abdominal tenderness.  Musculoskeletal:     Cervical back: Normal range of motion and neck supple.  Skin:    General: Skin is warm and dry.     Findings: No rash.  Neurological:     Mental Status: She is alert.  Psychiatric:        Mood and Affect: Mood is not anxious or depressed.        Speech: Speech normal.        Behavior: Behavior normal. Behavior is cooperative.        Thought Content: Thought content normal.        Judgment: Judgment normal.       Results for orders placed or performed in visit on 10/18/21  CMP ([facility] only)  Result Value Ref Range   Sodium 138 135 - 145 mmol/L   Potassium 4.2 3.5 - 5.1 mmol/L   Chloride 104 98 - 111 mmol/L   CO2 30 22 - 32 mmol/L   Glucose, Bld 101 (H) 70 - 99 mg/dL   BUN 14 8 - 23 mg/dL   Creatinine 0.69 0.44 - 1.00 mg/dL   Calcium 9.8 8.9 - 10.3 mg/dL   Total Protein 6.7 6.5 - 8.1 g/dL   Albumin 4.0 3.5 - 5.0 g/dL   AST 17 15 - 41 U/L   ALT 14 0 - 44 U/L   Alkaline Phosphatase 58 38 - 126 U/L   Total Bilirubin 0.7 0.3 - 1.2 mg/dL   GFR, Estimated >60 >60 mL/min   Anion  gap 4 (  L) 5 - 15  CBC with Differential ([facility] Only)  Result Value Ref Range   WBC Count 7.4 4.0 - 10.5 K/uL   RBC 4.59 3.87 - 5.11 MIL/uL   Hemoglobin 14.3 12.0 - 15.0 g/dL   HCT 41.9 36.0 - 46.0 %   MCV 91.3 80.0 - 100.0 fL   MCH 31.2 26.0 - 34.0 pg   MCHC 34.1 30.0 - 36.0 g/dL   RDW 13.2 11.5 - 15.5 %   Platelet Count 296 150 - 400 K/uL   nRBC 0.0 0.0 - 0.2 %   Neutrophils Relative % 58 %   Neutro Abs 4.3 1.7 - 7.7 K/uL   Lymphocytes Relative 32 %   Lymphs Abs 2.4 0.7 - 4.0 K/uL   Monocytes Relative 7 %   Monocytes Absolute 0.5 0.1 - 1.0 K/uL   Eosinophils Relative 2 %   Eosinophils Absolute 0.1 0.0 - 0.5 K/uL   Basophils Relative 1 %   Basophils Absolute 0.1 0.0 - 0.1 K/uL   Immature Granulocytes 0 %   Abs Immature Granulocytes 0.03 0.00 - 0.07 K/uL     COVID 19 screen:  No recent travel or known exposure to COVID19 The patient denies respiratory symptoms of COVID 19 at this time. The importance of social distancing was discussed today.   Assessment and Plan Problem List Items Addressed This Visit     COPD exacerbation ([place]) - Primary    Acute, given change in sputum and greater than 10 days of illness now going on 3 weeks we will treat for bacterial superinfection with azithromycin x5 days.  I will also treat with a course of prednisone taper over 6 days given wheezing and shortness of breath.  She will continue using her Trelegy daily as well as albuterol as needed.  Return precautions and ER precautions provided.      Relevant Medications   azithromycin (ZITHROMAX) 250 MG tablet   predniSONE (DELTASONE) 20 MG tablet   Meds ordered this encounter  Medications   Cholecalciferol (VITAMIN D3) 1.25 MG (50000 UT) CAPS    Sig: Take 1 capsule by mouth once a week.    Dispense:  12 capsule    Refill:  3   azithromycin (ZITHROMAX) 250 MG tablet    Sig: 2 tab po x 1 day then 1 tab po daily    Dispense:  6 tablet    Refill:  0   predniSONE (DELTASONE) 20 MG  tablet    Sig: 3 tabs by mouth daily x 3 days, then 2 tabs by mouth daily x 2 days then 1 tab by mouth daily x 2 days    Dispense:  15 tablet    Refill:  0       Eliezer Lofts, MD

## 2021-12-23 NOTE — Assessment & Plan Note (Signed)
Acute, given change in sputum and greater than 10 days of illness now going on 3 weeks we will treat for bacterial superinfection with azithromycin x5 days.  I will also treat with a course of prednisone taper over 6 days given wheezing and shortness of breath.  She will continue using her Trelegy daily as well as albuterol as needed.  Return precautions and ER precautions provided.

## 2021-12-24 NOTE — Progress Notes (Deleted)
Cardiology Clinic Note   Patient Name: Hannah Green Date of Encounter: 12/24/2021  Primary Care Provider:  Jinny Sanders, MD Primary Cardiologist:  Glenetta Hew, MD  Patient Profile    Hannah Green 76 year old female presents to the clinic today for follow-up evaluation of her hyperlipidemia  Past Medical History    Past Medical History:  Diagnosis Date   Breast cancer [facility]) 1999   Cataract 2017   resolved with surgery   COPD (chronic obstructive pulmonary disease) ([place])    Coronary artery disease    Dysplastic nevus 09/04/2012   R prox calf - mild    GERD (gastroesophageal reflux disease)    Hyperlipidemia    Postoperative atrial fibrillation ([place]) 03/2018   Rheumatoid arthritis(714.0)    Squamous cell carcinoma of lung, stage I, right ([place]) 2020   Status post right upper and middle lobectomy   Past Surgical History:  Procedure Laterality Date   BREAST LUMPECTOMY  1999   left, needed radiation   CATARACT EXTRACTION W/ INTRAOCULAR LENS  IMPLANT, BILATERAL Bilateral 05/19/15, 06/02/15   CHEST TUBE INSERTION Right 04/23/2018   Procedure: Right Lateral Chest Tube Insertion;  Surgeon: Melrose Nakayama, MD;  Location: [place];  Service: Thoracic;  Laterality: Right;   CHOLECYSTECTOMY     LOBECTOMY Right 04/23/2018   Procedure: RIGHT UPPER LOBECTOMY;  Surgeon: Melrose Nakayama, MD;  Location: [place];  Service: Thoracic;  Laterality: Right;   NM MYOVIEW LTD  01/2016   LOW RISK. Hypertensive response to exercise followed by significant drop (stage I pressure 211/70 drop to 118/75. Stage II). Apical artifact but otherwise no ischemia or infarction. EF 64%.   NODE DISSECTION Right 04/23/2018   Procedure: MEDIASTINAL NODE DISSECTION;  Surgeon: Melrose Nakayama, MD;  Location: [facility] OR;  Service: Thoracic;  Laterality: Right;   RIGHT/LEFT HEART CATH AND CORONARY ANGIOGRAPHY N/A 04/10/2018   Procedure: RIGHT/LEFT HEART CATH AND CORONARY ANGIOGRAPHY;  Surgeon:  Leonie Man, MD;  Location: [place] [facility];; angiographically minimal coronary artery disease.  Normal LVEDP.  Basely normal RHC pressures with no suggestion of pulmonary hypertension.  Normal CO-CI by Fick (5.9-2.8)   TONSILLECTOMY     TRANSTHORACIC ECHOCARDIOGRAM  11/'17; 2/'20   a) Nov 2017: Mild LVH. EF 60-65%. GR 2-DD; b) Feb 2020: Normal LV size and function.  EF 60 to 65%.  No R WMA.  GR 1 DD.  Normal RV size and function.  Normal pressures.  No valvular disease.   VIDEO ASSISTED THORACOSCOPY (VATS)/WEDGE RESECTION Right 04/23/2018   Procedure: VIDEO ASSISTED THORACOSCOPY (VATS)/WEDGE RESECTION OF RIGHT UPPER LUNG;  Surgeon: Melrose Nakayama, MD;  Location: [place];  Service: Thoracic;  Laterality: Right;    Allergies  Allergies  Allergen Reactions   Adhesive [Tape] Rash    Paper tape causes rash   Neosporin [Neomycin-Bacitracin Zn-Polymyx] Itching   Penicillins Rash    Did it involve swelling of the face/tongue/throat, SOB, or low BP? No Did it involve sudden or severe rash/hives, skin peeling, or any reaction on the inside of your mouth or nose? Yes Did you need to seek medical attention at a hospital or doctor's office? Yes When did it last happen? From childhood If all above answers are "NO", may proceed with cephalosporin use.     History of Present Illness    Hannah Green has a PMH of COPD, pneumonia, squamous cell carcinoma right lung, GERD, vitamin D deficiency, breast CA 1999, tremor, prediabetes, diastolic  dysfunction, and  hyperlipidemia.  She underwent right-sided VATS with bilobectomy which was complicated by postoperative atrial fibrillation.  She underwent right and left cardiac catheterization 04/10/2018 which showed normal coronary anatomy and normal right heart pressures with no evidence of pulmonary hypertension.  She was seen by Dr. Ellyn Hack on 08/29/2019.  At that time she complained of right greater than left lower extremity swelling.  There was no  sign of significant volume overload.  She was noted to have intermittent tachypalpitations with diffuse chest discomfort.  Her lower extremity venous Dopplers 7/21 showed no DVT.  Repeat echocardiogram 7/21 showed an EF of 70-75%, no regional wall motion abnormalities, mild MR, and normal RV function.  She was seen in follow-up by Almyra Deforest PA-C on 08/17/2020.  During that time she noted occasional lower extremity swelling.  On exam her legs were euvolemic.  She noticed occasional pain with walking.  Lower extremity ABIs were ordered and showed no significant obstruction/restriction.  She also noted occasional sharp pain which would only last for seconds and dissipate.  It appeared not to be cardiac in nature.  She presents to the clinic today for follow-up evaluation and states***  *** denies chest pain, shortness of breath, lower extremity edema, fatigue, palpitations, melena, hematuria, hemoptysis, diaphoresis, weakness, presyncope, syncope, orthopnea, and PND.  Atypical chest pain-no recent episodes.  Previously underwent right and left cardiac catheterization showed normal coronary anatomy and normal heart pressures. Continue aspirin, atorvastatin Heart healthy low-sodium diet-salty 6 given Increase physical activity as tolerated  Paroxysmal atrial fibrillation-noted to have atrial fibrillation postoperatively.  No further episodes of atrial fibrillation.  Denies palpitations. Continue aspirin Heart healthy low-sodium diet-salty 6 given Increase physical activity as tolerated Avoid triggers caffeine, chocolate, EtOH, dehydration etc.  Lower extremity pain-euvolemic.  No pain recently.  Remains somewhat physically active.  Previously had lower extremity ABIs which did not show restrictive flow. Continue atorvastatin, aspirin  Disposition: Follow-up with Dr. Ellyn Hack or me in 12 months.  Home Medications    Prior to Admission medications   Medication Sig Start Date End Date Taking?  Authorizing Provider  albuterol (VENTOLIN HFA) 108 (90 Base) MCG/ACT inhaler TAKE 2 PUFFS BY MOUTH EVERY 6 HOURS AS NEEDED FOR WHEEZE OR SHORTNESS OF BREATH 09/16/19   Magdalen Spatz, NP  aspirin EC 81 MG tablet Take 1 tablet (81 mg total) by mouth daily. 04/04/18   Leonie Man, MD  atorvastatin (LIPITOR) 10 MG tablet TAKE 1 TABLET BY MOUTH EVERY DAY 12/17/21   Bedsole, Amy E, MD  azithromycin (ZITHROMAX) 250 MG tablet 2 tab po x 1 day then 1 tab po daily 12/23/21   Bedsole, Amy E, MD  Cholecalciferol (VITAMIN D3) 1.25 MG (50000 UT) CAPS Take 1 capsule by mouth once a week. 12/23/21   Bedsole, Amy E, MD  Fluticasone-Umeclidin-Vilant (TRELEGY ELLIPTA) 100-62.5-25 MCG/ACT AEPB Inhale 1 puff into the lungs daily. 10/19/21   Jinny Sanders, MD  minoxidil (LONITEN) 2.5 MG tablet Take 1/2 tablet daily 11/02/21   Ralene Bathe, MD  predniSONE (DELTASONE) 20 MG tablet 3 tabs by mouth daily x 3 days, then 2 tabs by mouth daily x 2 days then 1 tab by mouth daily x 2 days 12/23/21   Jinny Sanders, MD    Family History    Family History  Problem Relation Age of Onset   Cancer Mother        breast   Cancer Father        lung cancer  Cancer Sister        throat cancer   Diabetes Paternal Uncle    Diabetes Maternal Grandmother    She indicated that the status of her mother is unknown. She indicated that her father is deceased. She indicated that the status of her sister is unknown. She indicated that the status of her maternal grandmother is unknown. She indicated that the status of her paternal uncle is unknown.  Social History    Social History   Socioeconomic History   Marital status: Divorced    Spouse name: Not on file   Number of children: 2   Years of education: Not on file   Highest education level: Not on file  Occupational History   Occupation: Retired Freight forwarder    Comment: Veterinary surgeon  Tobacco Use   Smoking status: Former    Packs/day: 1.50    Years: 50.00    Total pack  years: 75.00    Types: Cigarettes    Quit date: 04/01/2011    Years since quitting: 10.7   Smokeless tobacco: Never   Tobacco comments:    has stopped but has started again in "crisis"  Vaping Use   Vaping Use: Never used  Substance and Sexual Activity   Alcohol use: Yes    Alcohol/week: 2.0 standard drinks of alcohol    Types: 2 Glasses of wine per week    Comment: monthly   Drug use: No   Sexual activity: Never  Other Topics Concern   Not on file  Social History Narrative   Lives alone   No living will   Requests friend Porfirio Mylar to make health care decisions.   Would accept resuscitation but no prolonged ventilation   Would not want tube feeds if cognitively unaware   Social Determinants of Health   Financial Resource Strain: Low Risk  (02/14/2020)   Overall Financial Resource Strain (CARDIA)    Difficulty of Paying Living Expenses: Not hard at all  Food Insecurity: No Food Insecurity (02/14/2020)   Hunger Vital Sign    Worried About Running Out of Food in the Last Year: Never true    Ran Out of Food in the Last Year: Never true  Transportation Needs: No Transportation Needs (02/14/2020)   PRAPARE - Hydrologist (Medical): No    Lack of Transportation (Non-Medical): No  Physical Activity: Insufficiently Active (02/14/2020)   Exercise Vital Sign    Days of Exercise per Week: 2 days    Minutes of Exercise per Session: 60 min  Stress: No Stress Concern Present (02/14/2020)   [place]    Feeling of Stress : Not at all  Social Connections: Not on file  Intimate Partner Violence: Not At Risk (02/14/2020)   Humiliation, Afraid, Rape, and Kick questionnaire    Fear of Current or Ex-Partner: No    Emotionally Abused: No    Physically Abused: No    Sexually Abused: No     Review of Systems    General:  No chills, fever, night sweats or weight changes.  Cardiovascular:  No  chest pain, dyspnea on exertion, edema, orthopnea, palpitations, paroxysmal nocturnal dyspnea. Dermatological: No rash, lesions/masses Respiratory: No cough, dyspnea Urologic: No hematuria, dysuria Abdominal:   No nausea, vomiting, diarrhea, bright red blood per rectum, melena, or hematemesis Neurologic:  No visual changes, wkns, changes in mental status. All other systems reviewed and are otherwise negative except as noted above.  Physical Exam    VS:  LMP  (LMP Unknown)  , BMI There is no height or weight on file to calculate BMI. GEN: Well nourished, well developed, in no acute distress. HEENT: normal. Neck: Supple, no JVD, carotid bruits, or masses. Cardiac: RRR, no murmurs, rubs, or gallops. No clubbing, cyanosis, edema.  Radials/DP/PT 2+ and equal bilaterally.  Respiratory:  Respirations regular and unlabored, clear to auscultation bilaterally. GI: Soft, nontender, nondistended, BS + x 4. MS: no deformity or atrophy. Skin: warm and dry, no rash. Neuro:  Strength and sensation are intact. Psych: Normal affect.  Accessory Clinical Findings    Recent Labs: 10/18/2021: ALT 14; BUN 14; Creatinine 0.69; Hemoglobin 14.3; Platelet Count 296; Potassium 4.2; Sodium 138   Recent Lipid Panel    Component Value Date/Time   CHOL 209 (H) 10/12/2021 0806   TRIG 154.0 (H) 10/12/2021 0806   HDL 62.70 10/12/2021 0806   CHOLHDL 3 10/12/2021 0806   VLDL 30.8 10/12/2021 0806   LDLCALC 116 (H) 10/12/2021 0806   LDLDIRECT 136.2 06/03/2011 0836         ECG personally reviewed by me today- *** - No acute changes  Cardiac catheterization 04/10/2018 Normal coronary arteries LV end diastolic pressure is normal. (Echocardiogram showed normal EF) Normal right heart cath pressures with no evidence pulmonary pretension Normal cardiac output/index   SUMMARY Angiographically normal coronary arteries Normal Right Heart Cath Pressures with no evidence of Pulmonary Hypertension Normal Cardiac  Output-Index: 5.9-2.8   --NON-ANGINAL CHEST PAIN AND DYSPNEA   RECOMMENDATIONS Discharge home after bedrest.  Standard post cath follow-up Suspect chest pain dyspnea related to pulmonary issue. No impediment to now proceed with lung surgery.     Echo 09/23/2019  1. Global longitudinal strain is -14.6%. Left ventricular ejection  fraction, by estimation, is 70 to 75%. The left ventricle has hyperdynamic  function. The left ventricle has no regional wall motion abnormalities.  There is mild left ventricular  hypertrophy. Left ventricular diastolic parameters were normal.   2. Right ventricular systolic function is normal. The right ventricular  size is normal.   3. The mitral valve is abnormal. Mild mitral valve regurgitation.   4. The aortic valve is normal in structure. Aortic valve regurgitation is  not visualized.   5. The inferior vena cava is normal in size with greater than 50%  respiratory variability, suggesting right atrial pressure of 3 mmHg.  Assessment & Plan   1.  ***   Jossie Ng. Maliek Schellhorn NP-C     12/24/2021, 6:57 AM [place] [address] Office [phone] Fax [phone]  Notice: This dictation was prepared with Dragon dictation along with smaller phrase technology. Any transcriptional errors that result from this process are unintentional and may not be corrected upon review.  I spent***minutes examining this patient, reviewing medications, and using patient centered shared decision making involving her cardiac care.  Prior to her visit I spent greater than 20 minutes reviewing her past medical history,  medications, and prior cardiac tests.

## 2021-12-27 ENCOUNTER — Ambulatory Visit: Payer: Medicare HMO | Admitting: General Practice

## 2022-01-11 ENCOUNTER — Ambulatory Visit: Payer: Medicare HMO | Admitting: Dermatology

## 2022-01-11 VITALS — BP 128/63 | HR 90

## 2022-01-11 DIAGNOSIS — L918 Other hypertrophic disorders of the skin: Secondary | ICD-10-CM

## 2022-01-11 DIAGNOSIS — L578 Other skin changes due to chronic exposure to nonionizing radiation: Secondary | ICD-10-CM | POA: Diagnosis not present

## 2022-01-11 DIAGNOSIS — Z872 Personal history of diseases of the skin and subcutaneous tissue: Secondary | ICD-10-CM | POA: Diagnosis not present

## 2022-01-11 DIAGNOSIS — L650 Telogen effluvium: Secondary | ICD-10-CM

## 2022-01-11 MED ORDER — MINOXIDIL 2.5 MG PO TABS: ORAL_TABLET | ORAL | 1 refills | Status: DC

## 2022-01-11 NOTE — Patient Instructions (Addendum)
Due to recent changes in healthcare laws, you may see results of your pathology and/or laboratory studies on MyChart before the doctors have had a chance to review them. We understand that in some cases there may be results that are confusing or concerning to you. Please understand that not all results are received at the same time and often the doctors may need to interpret multiple results in order to provide you with the best plan of care or course of treatment. Therefore, we ask that you please give us 2 business days to thoroughly review all your results before contacting the office for clarification. Should we see a critical lab result, you will be contacted sooner.   If You Need Anything After Your Visit  If you have any questions or concerns for your doctor, please call our main line at [phone] and press option 4 to reach your doctor's medical assistant. If no one answers, please leave a voicemail as directed and we will return your call as soon as possible. Messages left after 4 pm will be answered the following business day.   You may also send us a message via MyChart. We typically respond to MyChart messages within 1-2 business days.  For prescription refills, please ask your pharmacy to contact our office. Our fax number is [phone].  If you have an urgent issue when the clinic is closed that cannot wait until the next business day, you can page your doctor at the number below.    Please note that while we do our best to be available for urgent issues outside of office hours, we are not available 24/7.   If you have an urgent issue and are unable to reach us, you may choose to seek medical care at your doctor's office, retail clinic, urgent care center, or emergency room.  If you have a medical emergency, please immediately call 911 or go to the emergency department.  Pager Numbers  - Dr. Kowalski: [phone]  - Dr. Moye: [phone]  - Dr. Stewart:  [phone]  In the event of inclement weather, please call our main line at [phone] for an update on the status of any delays or closures.  Dermatology Medication Tips: Please keep the boxes that topical medications come in in order to help keep track of the instructions about where and how to use these. Pharmacies typically print the medication instructions only on the boxes and not directly on the medication tubes.   If your medication is too expensive, please contact our office at [phone] option 4 or send us a message through MyChart.   We are unable to tell what your co-pay for medications will be in advance as this is different depending on your insurance coverage. However, we may be able to find a substitute medication at lower cost or fill out paperwork to get insurance to cover a needed medication.   If a prior authorization is required to get your medication covered by your insurance company, please allow us 1-2 business days to complete this process.  Drug prices often vary depending on where the prescription is filled and some pharmacies may offer cheaper prices.  The website www.goodrx.com contains coupons for medications through different pharmacies. The prices here do not account for what the cost may be with help from insurance (it may be cheaper with your insurance), but the website can give you the price if you did not use any insurance.  - You can print the associated coupon and take it with   your prescription to the pharmacy.  - You may also stop by our office during regular business hours and pick up a GoodRx coupon card.  - If you need your prescription sent electronically to a different pharmacy, notify our office through [facility] MyChart or by phone at [phone] option 4.     Si Usted Necesita Algo Despus de Su Visita  Tambin puede enviarnos un mensaje a travs de MyChart. Por lo general respondemos a los mensajes de MyChart en el transcurso de 1 a 2  das hbiles.  Para renovar recetas, por favor pida a su farmacia que se ponga en contacto con nuestra oficina. Nuestro nmero de fax es el [phone].  Si tiene un asunto urgente cuando la clnica est cerrada y que no puede esperar hasta el siguiente da hbil, puede llamar/localizar a su doctor(a) al nmero que aparece a continuacin.   Por favor, tenga en cuenta que aunque hacemos todo lo posible para estar disponibles para asuntos urgentes fuera del horario de oficina, no estamos disponibles las 24 horas del da, los 7 das de la semana.   Si tiene un problema urgente y no puede comunicarse con nosotros, puede optar por buscar atencin mdica  en el consultorio de su doctor(a), en una clnica privada, en un centro de atencin urgente o en una sala de emergencias.  Si tiene una emergencia mdica, por favor llame inmediatamente al 911 o vaya a la sala de emergencias.  Nmeros de bper  - Dr. Kowalski: [phone]  - Dra. Moye: [phone]  - Dra. Stewart: [phone]  En caso de inclemencias del tiempo, por favor llame a nuestra lnea principal al [phone] para una actualizacin sobre el estado de cualquier retraso o cierre.  Consejos para la medicacin en dermatologa: Por favor, guarde las cajas en las que vienen los medicamentos de uso tpico para ayudarle a seguir las instrucciones sobre dnde y cmo usarlos. Las farmacias generalmente imprimen las instrucciones del medicamento slo en las cajas y no directamente en los tubos del medicamento.   Si su medicamento es muy caro, por favor, pngase en contacto con nuestra oficina llamando al [phone] y presione la opcin 4 o envenos un mensaje a travs de MyChart.   No podemos decirle cul ser su copago por los medicamentos por adelantado ya que esto es diferente dependiendo de la cobertura de su seguro. Sin embargo, es posible que podamos encontrar un medicamento sustituto a menor costo o llenar un formulario para que el  seguro cubra el medicamento que se considera necesario.   Si se requiere una autorizacin previa para que su compaa de seguros cubra su medicamento, por favor permtanos de 1 a 2 das hbiles para completar este proceso.  Los precios de los medicamentos varan con frecuencia dependiendo del lugar de dnde se surte la receta y alguna farmacias pueden ofrecer precios ms baratos.  El sitio web www.goodrx.com tiene cupones para medicamentos de diferentes farmacias. Los precios aqu no tienen en cuenta lo que podra costar con la ayuda del seguro (puede ser ms barato con su seguro), pero el sitio web puede darle el precio si no utiliz ningn seguro.  - Puede imprimir el cupn correspondiente y llevarlo con su receta a la farmacia.  - Tambin puede pasar por nuestra oficina durante el horario de atencin regular y recoger una tarjeta de cupones de GoodRx.  - Si necesita que su receta se enve electrnicamente a una farmacia diferente, informe a nuestra oficina a travs de MyChart de [place]   o por telfono llamando al [phone] y presione la opcin 4.  

## 2022-01-11 NOTE — Progress Notes (Signed)
Follow-Up Visit   Subjective  Hannah Green is a 76 y.o. female who presents for the following: Telogen effluvium (Patient currently using 1.25 mg of Minoxidil QD but c/o dizziness. She also uses Biotin OTC. She hasn't noticed an improvement in hair regrowth and initially much more hair).  The following portions of the chart were reviewed this encounter and updated as appropriate:   Tobacco  Allergies  Meds  Problems  Med Hx  Surg Hx  Fam Hx     Review of Systems:  No other skin or systemic complaints except as noted in HPI or Assessment and Plan.  Objective  Well appearing patient in no apparent distress; mood and affect are within normal limits.  A focused examination was performed including the face, scalp, and trunk. Relevant physical exam findings are noted in the Assessment and Plan.  Scalp Diffuse thinning of hair, positive hair pull test.             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 Condition is symptomatic / bothersome to patient. Not to goal.  Telogen effluvium is a benign, self-limited condition causing increased hair shedding usually for several months. It does not progress to baldness, and the hair eventually grows back on its own. It can be triggered by recent illness, recent surgery, thyroid disease, low iron stores, vitamin D deficiency, fad diets or rapid weight loss, hormonal changes such as pregnancy or birth control pills, and some medication. Usually the hair loss starts 2-3 months after the illness or health change. Rarely, it can continue for longer than a year.  Continue : Minoxidil 2.5mg  1/2 tab po QD and  Biotin QD.   Related Medications minoxidil (LONITEN) 2.5 MG tablet Take 1/2 tablet daily  Skin tag Left Axilla x 1 Symptomatic, irritating, patient would like treated. Destruction of lesion - Left Axilla x  1 Complexity: simple   Destruction method: cryotherapy   Informed consent: discussed and consent obtained   Timeout:  patient name, date of birth, surgical site, and procedure verified Lesion destroyed using liquid nitrogen: Yes   Region frozen until ice ball extended beyond lesion: Yes   Outcome: patient tolerated procedure well with no complications   Post-procedure details: wound care instructions given    History of PreCancerous Actinic Keratosis  - L cheek  - site(s) of PreCancerous Actinic Keratosis clear today. - these may recur and new lesions may form requiring treatment to prevent transformation into skin cancer - observe for new or changing spots and contact [place] for appointment if occur - photoprotection with sun protective clothing; sunglasses and broad spectrum sunscreen with SPF of at least 30 + and frequent self skin exams recommended - yearly exams by a dermatologist recommended for persons with history of PreCancerous Actinic Keratoses  Actinic Damage - chronic, secondary to cumulative UV radiation exposure/sun exposure over time - diffuse scaly erythematous macules with underlying dyspigmentation - Recommend daily broad spectrum sunscreen SPF 30+ to sun-exposed areas, reapply every 2 hours as needed.  - Recommend staying in the shade or wearing long sleeves, sun glasses (UVA+UVB protection) and wide brim hats (4-inch brim around the entire circumference of the hat). - Call for new or changing lesions.  Return in about 6 months (around 07/12/2022) for telogen effluvium follow up.  Luther Redo, CMA, am acting as scribe for Sarina Ser, MD . Documentation: I have  reviewed the above documentation for accuracy and completeness, and I agree with the above.  Sarina Ser, MD

## 2022-01-15 ENCOUNTER — Encounter: Payer: Self-pay | Admitting: Dermatology

## 2022-02-03 ENCOUNTER — Other Ambulatory Visit: Payer: Self-pay | Admitting: Pulmonary Disease

## 2022-02-04 DIAGNOSIS — K219 Gastro-esophageal reflux disease without esophagitis: Secondary | ICD-10-CM | POA: Diagnosis not present

## 2022-02-04 DIAGNOSIS — J300 Vasomotor rhinitis: Secondary | ICD-10-CM | POA: Diagnosis not present

## 2022-02-04 DIAGNOSIS — H6983 Other specified disorders of Eustachian tube, bilateral: Secondary | ICD-10-CM | POA: Diagnosis not present

## 2022-02-04 DIAGNOSIS — R1314 Dysphagia, pharyngoesophageal phase: Secondary | ICD-10-CM | POA: Diagnosis not present

## 2022-02-09 ENCOUNTER — Other Ambulatory Visit: Payer: Self-pay | Admitting: Otolaryngology

## 2022-02-09 DIAGNOSIS — K219 Gastro-esophageal reflux disease without esophagitis: Secondary | ICD-10-CM

## 2022-02-09 DIAGNOSIS — R1310 Dysphagia, unspecified: Secondary | ICD-10-CM

## 2022-03-07 ENCOUNTER — Ambulatory Visit: Payer: Medicare HMO

## 2022-03-19 ENCOUNTER — Other Ambulatory Visit: Payer: Self-pay | Admitting: Family Medicine

## 2022-03-20 NOTE — Telephone Encounter (Signed)
Please call and schedule fasting lab appointment to recheck lipid panel.  Dr. Ermalene Searing has wanted to her return in 3 months from her August appointment.

## 2022-03-21 NOTE — Telephone Encounter (Signed)
Spoke to pt, scheduled labs for tomorrow, 1/23 & scheduled F/U for 03/31/22

## 2022-03-22 ENCOUNTER — Other Ambulatory Visit (INDEPENDENT_AMBULATORY_CARE_PROVIDER_SITE_OTHER): Payer: Medicare HMO

## 2022-03-22 ENCOUNTER — Telehealth: Payer: Self-pay | Admitting: Family Medicine

## 2022-03-22 DIAGNOSIS — E785 Hyperlipidemia, unspecified: Secondary | ICD-10-CM

## 2022-03-22 DIAGNOSIS — E559 Vitamin D deficiency, unspecified: Secondary | ICD-10-CM | POA: Diagnosis not present

## 2022-03-22 DIAGNOSIS — R7303 Prediabetes: Secondary | ICD-10-CM | POA: Diagnosis not present

## 2022-03-22 LAB — LIPID PANEL
Cholesterol: 192 mg/dL (ref 0–200)
HDL: 74.3 mg/dL (ref >39.00)
LDL Cholesterol: 102 mg/dL — ABNORMAL HIGH (ref 0–99)
NonHDL: 117.45
Total CHOL/HDL Ratio: 3
Triglycerides: 79 mg/dL (ref 0.0–149.0)
VLDL: 15.8 mg/dL (ref 0.0–40.0)

## 2022-03-22 LAB — COMPREHENSIVE METABOLIC PANEL
ALT: 19 U/L (ref 0–35)
AST: 18 U/L (ref 0–37)
Albumin: 4 g/dL (ref 3.5–5.2)
Alkaline Phosphatase: 55 U/L (ref 39–117)
BUN: 19 mg/dL (ref 6–23)
CO2: 30 mEq/L (ref 19–32)
Calcium: 9.6 mg/dL (ref 8.4–10.5)
Chloride: 104 mEq/L (ref 96–112)
Creatinine, Ser: 0.65 mg/dL (ref 0.40–1.20)
GFR: 85.39 mL/min (ref >60.00)
Glucose, Bld: 107 mg/dL — ABNORMAL HIGH (ref 70–99)
Potassium: 4.5 mEq/L (ref 3.5–5.1)
Sodium: 141 mEq/L (ref 135–145)
Total Bilirubin: 0.5 mg/dL (ref 0.2–1.2)
Total Protein: 6.6 g/dL (ref 6.0–8.3)

## 2022-03-22 LAB — VITAMIN D 25 HYDROXY (VIT D DEFICIENCY, FRACTURES): VITD: 46.85 ng/mL (ref 30.00–100.00)

## 2022-03-22 LAB — HEMOGLOBIN A1C: Hgb A1c MFr Bld: 5.9 % (ref 4.6–6.5)

## 2022-03-22 NOTE — Telephone Encounter (Signed)
-----  Message from Alvina Chou sent at 03/22/2022  8:29 AM EST ----- Regarding: lab orders for now Lab orders for fasting labs, thanks

## 2022-03-22 NOTE — Progress Notes (Signed)
No critical labs need to be addressed urgently. We will discuss labs in detail at upcoming office visit.   

## 2022-03-31 ENCOUNTER — Encounter: Payer: Self-pay | Admitting: Family Medicine

## 2022-03-31 ENCOUNTER — Ambulatory Visit (INDEPENDENT_AMBULATORY_CARE_PROVIDER_SITE_OTHER): Payer: Medicare HMO | Admitting: Family Medicine

## 2022-03-31 VITALS — BP 140/60 | HR 64 | Temp 98.3°F | Ht 66.25 in | Wt 222.2 lb

## 2022-03-31 DIAGNOSIS — H6993 Unspecified Eustachian tube disorder, bilateral: Secondary | ICD-10-CM | POA: Diagnosis not present

## 2022-03-31 DIAGNOSIS — E559 Vitamin D deficiency, unspecified: Secondary | ICD-10-CM | POA: Diagnosis not present

## 2022-03-31 DIAGNOSIS — E785 Hyperlipidemia, unspecified: Secondary | ICD-10-CM

## 2022-03-31 DIAGNOSIS — R7303 Prediabetes: Secondary | ICD-10-CM

## 2022-03-31 MED ORDER — BREZTRI AEROSPHERE 160-9-4.8 MCG/ACT IN AERO: 2.0000 | INHALATION_SPRAY | Freq: Every day | RESPIRATORY_TRACT | 11 refills | Status: DC | PRN

## 2022-03-31 NOTE — Assessment & Plan Note (Signed)
Chronic, resolved on weekly high-dose vitamin D supplementation.  She wishes to continue this weekly for now.

## 2022-03-31 NOTE — Assessment & Plan Note (Signed)
Acute, ongoing now several months following repeated upper respiratory tract infection. No current evidence of ongoing bacterial infection.  Will treat with Flonase 2 sprays per nostril daily x 2 weeks.  If not improving at that time we will consider repeat course of prednisone versus ENT referral. Patient cautioned that if she has increase in pain or new onset fever she needs to call the office for possible course of antibiotics.

## 2022-03-31 NOTE — Patient Instructions (Addendum)
Work on low carb low fat diet.  Restart regular exercise.  Start flonase 2 spray per nostril daily x 2 weeks.

## 2022-03-31 NOTE — Assessment & Plan Note (Signed)
Chronic, stable control with diet  Encouraged her to get back on track with low-carb diet and restart regular exercise.

## 2022-03-31 NOTE — Assessment & Plan Note (Signed)
Chronic, improved control back on atorvastatin 10 mg daily.  No side effects. LDL almost at goal less than 100 (evidence of coronary artery calcification and atherosclerosis on catheterization).  Encouraged exercise, weight loss, healthy eating habits.

## 2022-03-31 NOTE — Progress Notes (Signed)
Patient ID: Hannah Green, female    DOB: 31-May-1945, 77 y.o.   MRN: [id number]  This visit was conducted in person.  BP (!) 140/60   Pulse 64   Temp 98.3 F (36.8 C) (Temporal)   Ht 5' 6.25" (1.683 m)   Wt 222 lb 4 oz (100.8 kg)   LMP  (LMP Unknown)   PF 97 L/min   BMI 35.60 kg/m    CC:  Chief Complaint  Patient presents with   Hyperlipidemia   Ear Pain    Subjective:   HPI: Hannah Green is a 77 y.o. female presenting on 03/31/2022 for Hyperlipidemia and Ear Pain   Elevated Cholesterol:per cardiology Dr. Ellyn Hack: With evidence of coronary artery calcification and atherosclerosis despite nonobstructive disease noted on cath, she still needs to be adequately controlled with an LDL less than 100.   LDL now almost at goal < 100 on atorvastatin 10 mg daily Lab Results  Component Value Date   CHOL 192 03/22/2022   HDL 74.30 03/22/2022   LDLCALC 102 (H) 03/22/2022   LDLDIRECT 136.2 06/03/2011   TRIG 79.0 03/22/2022   CHOLHDL 3 03/22/2022  Using medications without problems: Muscle aches:  Diet compliance: moderate Exercise: plans to restart. Other complaints:  The 10-year ASCVD risk score (Arnett DK, et al., 2019) is: 21.1%   Values used to calculate the score:     Age: 38 years     Sex: Female     Is Non-Hispanic African American: No     Diabetic: No     Tobacco smoker: No     Systolic Blood Pressure: 947 mmHg     Is BP treated: No     HDL Cholesterol: 74.3 mg/dL     Total Cholesterol: 192 mg/dL  Acute onset  bilateral ear pain: fullness and pain continue through all that.  No cough, no SOB.  No fever, no face pain.   Had flu/COPD exacerbation 11/2021 ( was on antibiotic and prednisone) and again after X-mas.  Vit D now back in normal range on  prescription weekly supplement.   Prediabetes  Lab Results  Component Value Date   HGBA1C 5.9 03/22/2022    Relevant past medical, surgical, family and social history reviewed and updated as indicated.  Interim medical history since our last visit reviewed. Allergies and medications reviewed and updated. Outpatient Medications Prior to Visit  Medication Sig Dispense Refill   atorvastatin (LIPITOR) 10 MG tablet TAKE 1 TABLET BY MOUTH EVERY DAY 90 tablet 0   Cholecalciferol (VITAMIN D3) 1.25 MG (50000 UT) CAPS Take 1 capsule by mouth once a week. 12 capsule 3   Fluticasone-Umeclidin-Vilant (TRELEGY ELLIPTA) 100-62.5-25 MCG/ACT AEPB Inhale 1 puff into the lungs daily. 1 each 11   minoxidil (LONITEN) 2.5 MG tablet Take 1/2 tablet daily 30 tablet 1   Budeson-Glycopyrrol-Formoterol (BREZTRI AEROSPHERE) 160-9-4.8 MCG/ACT AERO Inhale 2 puffs into the lungs daily as needed.     albuterol (VENTOLIN HFA) 108 (90 Base) MCG/ACT inhaler TAKE 2 PUFFS BY MOUTH EVERY 6 HOURS AS NEEDED FOR WHEEZE OR SHORTNESS OF BREATH 8.5 g 3   aspirin EC 81 MG tablet Take 1 tablet (81 mg total) by mouth daily. 90 tablet 3   azithromycin (ZITHROMAX) 250 MG tablet 2 tab po x 1 day then 1 tab po daily 6 tablet 0   predniSONE (DELTASONE) 20 MG tablet 3 tabs by mouth daily x 3 days, then 2 tabs by mouth daily x 2 days then 1  tab by mouth daily x 2 days (Patient not taking: Reported on 01/11/2022) 15 tablet 0   No facility-administered medications prior to visit.     Per HPI unless specifically indicated in ROS section below Review of Systems  Constitutional:  Negative for fatigue and fever.  HENT:  Negative for congestion.   Eyes:  Negative for pain.  Respiratory:  Negative for cough and shortness of breath.   Cardiovascular:  Negative for chest pain, palpitations and leg swelling.  Gastrointestinal:  Negative for abdominal pain.  Genitourinary:  Negative for dysuria and vaginal bleeding.  Musculoskeletal:  Negative for back pain.  Neurological:  Negative for syncope, light-headedness and headaches.  Psychiatric/Behavioral:  Negative for dysphoric mood.    Objective:  BP (!) 140/60   Pulse 64   Temp 98.3 F (36.8 C)  (Temporal)   Ht 5' 6.25" (1.683 m)   Wt 222 lb 4 oz (100.8 kg)   LMP  (LMP Unknown)   PF 97 L/min   BMI 35.60 kg/m   Wt Readings from Last 3 Encounters:  03/31/22 222 lb 4 oz (100.8 kg)  12/23/21 217 lb (98.4 kg)  10/20/21 210 lb 2 oz (95.3 kg)      Physical Exam Constitutional:      General: She is not in acute distress.    Appearance: Normal appearance. She is well-developed. She is not ill-appearing or toxic-appearing.  HENT:     Head: Normocephalic.     Right Ear: Hearing, ear canal and external ear normal. A middle ear effusion is present. Tympanic membrane is not injected, scarred, perforated, erythematous, retracted or bulging.     Left Ear: Hearing, ear canal and external ear normal. A middle ear effusion is present. Tympanic membrane is not injected, scarred, perforated, erythematous, retracted or bulging.     Ears:     Comments: Bubbles behind bilateral TM    Nose: No mucosal edema or rhinorrhea.     Right Sinus: No maxillary sinus tenderness or frontal sinus tenderness.     Left Sinus: No maxillary sinus tenderness or frontal sinus tenderness.     Mouth/Throat:     Pharynx: Uvula midline.  Eyes:     General: Lids are normal. Lids are everted, no foreign bodies appreciated.     Conjunctiva/sclera: Conjunctivae normal.     Pupils: Pupils are equal, round, and reactive to light.  Neck:     Thyroid: No thyroid mass or thyromegaly.     Vascular: No carotid bruit.     Trachea: Trachea normal.  Cardiovascular:     Rate and Rhythm: Normal rate and regular rhythm.     Pulses: Normal pulses.     Heart sounds: Normal heart sounds, S1 normal and S2 normal. No murmur heard.    No friction rub. No gallop.  Pulmonary:     Effort: Pulmonary effort is normal. No tachypnea or respiratory distress.     Breath sounds: Normal breath sounds. No decreased breath sounds, wheezing, rhonchi or rales.  Abdominal:     General: Bowel sounds are normal.     Palpations: Abdomen is soft.      Tenderness: There is no abdominal tenderness.  Musculoskeletal:     Cervical back: Normal range of motion and neck supple.  Skin:    General: Skin is warm and dry.     Findings: No rash.  Neurological:     Mental Status: She is alert.  Psychiatric:        Mood and Affect:  Mood is not anxious or depressed.        Speech: Speech normal.        Behavior: Behavior normal. Behavior is cooperative.        Thought Content: Thought content normal.        Judgment: Judgment normal.       Results for orders placed or performed in visit on 03/22/22  Hemoglobin A1c  Result Value Ref Range   Hgb A1c MFr Bld 5.9 4.6 - 6.5 %  Comprehensive metabolic panel  Result Value Ref Range   Sodium 141 135 - 145 mEq/L   Potassium 4.5 3.5 - 5.1 mEq/L   Chloride 104 96 - 112 mEq/L   CO2 30 19 - 32 mEq/L   Glucose, Bld 107 (H) 70 - 99 mg/dL   BUN 19 6 - 23 mg/dL   Creatinine, Ser 0.65 0.40 - 1.20 mg/dL   Total Bilirubin 0.5 0.2 - 1.2 mg/dL   Alkaline Phosphatase 55 39 - 117 U/L   AST 18 0 - 37 U/L   ALT 19 0 - 35 U/L   Total Protein 6.6 6.0 - 8.3 g/dL   Albumin 4.0 3.5 - 5.2 g/dL   GFR 85.39 >60.00 mL/min   Calcium 9.6 8.4 - 10.5 mg/dL  Lipid panel  Result Value Ref Range   Cholesterol 192 0 - 200 mg/dL   Triglycerides 79.0 0.0 - 149.0 mg/dL   HDL 74.30 >39.00 mg/dL   VLDL 15.8 0.0 - 40.0 mg/dL   LDL Cholesterol 102 (H) 0 - 99 mg/dL   Total CHOL/HDL Ratio 3    NonHDL 117.45   VITAMIN D 25 Hydroxy (Vit-D Deficiency, Fractures)  Result Value Ref Range   VITD 46.85 30.00 - 100.00 ng/mL    Assessment and Plan  Hyperlipidemia with target LDL less than 100 Assessment & Plan: Chronic, improved control back on atorvastatin 10 mg daily.  No side effects. LDL almost at goal less than 100 (evidence of coronary artery calcification and atherosclerosis on catheterization).  Encouraged exercise, weight loss, healthy eating habits.    Prediabetes Assessment & Plan: Chronic, stable control with  diet  Encouraged her to get back on track with low-carb diet and restart regular exercise.   ETD (Eustachian tube dysfunction), bilateral Assessment & Plan: Acute, ongoing now several months following repeated upper respiratory tract infection. No current evidence of ongoing bacterial infection.  Will treat with Flonase 2 sprays per nostril daily x 2 weeks.  If not improving at that time we will consider repeat course of prednisone versus ENT referral. Patient cautioned that if she has increase in pain or new onset fever she needs to call the office for possible course of antibiotics.   Vitamin D deficiency Assessment & Plan: Chronic, resolved on weekly high-dose vitamin D supplementation.  She wishes to continue this weekly for now.   Other orders -     [employer]; Inhale 2 puffs into the lungs daily as needed.  Dispense: 10.7 g; Refill: 11    Return in about 6 months (around 09/29/2022) for phone AMW,  fasting labs then 57min CPE with me.   Eliezer Lofts, MD

## 2022-04-11 ENCOUNTER — Ambulatory Visit: Payer: Medicare HMO | Admitting: Cardiology

## 2022-04-29 ENCOUNTER — Other Ambulatory Visit: Payer: Medicare HMO

## 2022-04-30 ENCOUNTER — Other Ambulatory Visit: Payer: Self-pay | Admitting: Dermatology

## 2022-04-30 DIAGNOSIS — L650 Telogen effluvium: Secondary | ICD-10-CM

## 2022-05-04 ENCOUNTER — Ambulatory Visit
Admission: RE | Admit: 2022-05-04 | Discharge: 2022-05-04 | Disposition: A | Discharge status: Home / Self Care | Payer: Medicare HMO | Source: Ambulatory Visit | Attending: Otolaryngology | Admitting: Otolaryngology

## 2022-05-04 DIAGNOSIS — K219 Gastro-esophageal reflux disease without esophagitis: Secondary | ICD-10-CM | POA: Insufficient documentation

## 2022-05-04 DIAGNOSIS — R1310 Dysphagia, unspecified: Secondary | ICD-10-CM | POA: Diagnosis not present

## 2022-05-04 NOTE — Progress Notes (Signed)
Modified Barium Swallow Study  Patient Details  Name: Hannah Green MRN: [id number] Date of Birth: 09-11-1945  Today's Date: 05/04/2022  Modified Barium Swallow completed.  Full report located under Chart Review in the Imaging Section.  History of Present Illness Hannah Green is a 77 yo female w/ h/o hyperlipidemia, R lobectomy s/p lung Ca, ETD followed by ENT and on Flonase, preDM, Vit D defficiency.  Hannah Green reported to ENT that she "gets choked on meats - steak especially" and that she was regurgitated such.  Hannah Green has been dx'd per chart notes w/ GERD.  Hannah Green denied being on a PPI -- she used to take "a lot of TUMS" but was told to discontinue heavy use of such by MD. Per CT Imaging in 2023 (no other recent cxr imaging in chart): "No pneumothorax. No pleural effusion. Status post right upper lobectomy and right middle lobectomy. Mild paraseptal and centrilobular emphysema. No acute consolidative airspace disease or lung masses; MOD Hiatal Hernia.".   Clinical Impression Patient presents with functional oropharyngeal phase swallowing in setting of diagnosed GERD history and suspicion of Esophageal phase Dysmotility(per patient complaint of s/s) and diagnosed MOD HIatal Hernia. Oral stage is characterized by adequate lip closure, bolus preparation and containment, and anterior to posterior transit. Swallow initiation occurs at the level of the valleculae for the majority of consistencies; slight spilling from the valleculae w/ thin liquids inconsistently(larger sip). Pharyngeal stage is noted for functional tongue base retraction(grossly WNL), hyolaryngeal excursion and adequate pharyngeal constriction. Epiglottic deflection is complete; there is No aspiration and No laryngeal penetration into the laryngeal vestibul noted during the swallow; any slight coating along underneath side of the epiglottis w/ thin liquid tirals does not remain nor increase. Pharyngeal stripping wave is complete. Noted trace BOT residue  inconsistently w/ trial consistencies -- Hannah Green c/o Dry mouth.  Amplitude/duration of cricopharyngeus opening is [facility] for adequate/complete clearance of boluses through the cervical esophagus. However, noted (both) slight Anterior and PROMINENT Posterior prominences of the CP Segment. An Esophageal sweep was performed in the upright A-P position w/ Cookie-Puree consistency which revealed bolus stasis in the mid-Esophagus below the Clavicles -- suspect this presentation of Dysmotility w/ the MOD Hiatal Hernia (per chart note) could cause the dysmotility and Retrograde flow(w/ solid foods) w/in the Esophagus that Hannah Green is complaining of. Suspect the impact of years of REFLUX (as Hannah Green described and not being on a PPI) could lend such presenation of the Esophagus seen today: CP Segment prominences, bolus stasis w/in the mid-Esophagus.    Consistencies tested: thin liquids x2 tsps, 1 cup sip, 2 sequential sips, nectar x1 tsp, 1 cup sip, pudding x1 tsp, regular solid x2 (1/2 graham cracker with pudding).  Discussed general REFLUX strategies and general aspiration precautions including moistening dry meats/foods, alternating solids and liquids, avoiding Problematic meats/foods, and using a puree for swallowing pills(large) IF needed w/ Hannah Green post study; study viewed w/ Hannah Green also.  No further ST indicated. Factors that may increase risk of adverse event in presence of aspiration Phineas Douglas & Padilla 2021):  (n/a)  Swallow Evaluation Recommendations Recommendations: PO diet PO Diet Recommendation: Regular;Thin liquids (Level 0) (cut meats, moistened foods) Liquid Administration via: Cup;Straw Medication Administration: Whole meds with liquid (vs whole w/ a puree IF desired) Supervision: Patient able to self-feed Swallowing strategies  : Minimize environmental distractions;Slow rate;Small bites/sips;Follow solids with liquids Postural changes: Position Hannah Green fully upright for meals;Stay upright 30-60 min after meals (GERD  precautions) Oral care recommendations: Oral care BID (2x/day);Hannah Green independent with oral  care Recommended consults: Consider GI consultation;Consider esophageal assessment (PPI discussion)        Orinda Kenner, MS, CCC-SLP Speech Language Pathologist Rehab Services; [place] [phone] (ascom) Darly Massi 05/04/2022,5:53 PM

## 2022-05-10 DIAGNOSIS — Z853 Personal history of malignant neoplasm of breast: Secondary | ICD-10-CM | POA: Diagnosis not present

## 2022-05-10 DIAGNOSIS — Z1231 Encounter for screening mammogram for malignant neoplasm of breast: Secondary | ICD-10-CM | POA: Diagnosis not present

## 2022-05-10 LAB — HM MAMMOGRAPHY

## 2022-05-12 ENCOUNTER — Encounter: Payer: Self-pay | Admitting: Family Medicine

## 2022-05-13 ENCOUNTER — Encounter: Payer: Self-pay | Admitting: Family Medicine

## 2022-05-18 ENCOUNTER — Other Ambulatory Visit: Payer: Self-pay | Admitting: *Deleted

## 2022-05-18 MED ORDER — ATORVASTATIN CALCIUM 10 MG PO TABS: 10.0000 mg | ORAL_TABLET | Freq: Every day | ORAL | 1 refills | Status: DC

## 2022-05-26 ENCOUNTER — Telehealth: Payer: Self-pay | Admitting: *Deleted

## 2022-05-26 DIAGNOSIS — K219 Gastro-esophageal reflux disease without esophagitis: Secondary | ICD-10-CM | POA: Diagnosis not present

## 2022-05-26 DIAGNOSIS — R1310 Dysphagia, unspecified: Secondary | ICD-10-CM | POA: Diagnosis not present

## 2022-05-26 DIAGNOSIS — R933 Abnormal findings on diagnostic imaging of other parts of digestive tract: Secondary | ICD-10-CM | POA: Diagnosis not present

## 2022-05-26 DIAGNOSIS — E669 Obesity, unspecified: Secondary | ICD-10-CM | POA: Diagnosis not present

## 2022-05-26 NOTE — Telephone Encounter (Signed)
   Pre-operative Risk Assessment    Patient Name: Hannah Green  DOB: 1945-04-05 MRN: [id number]      Request for Surgical Clearance    Procedure:   EGD  Date of Surgery:  Clearance 06/20/22                                 Surgeon:  DR. MANN Surgeon's Group or Practice Name:  [facility] Phone number:  [phone] Fax number:  [phone]   Type of Clearance Requested:   - Medical    Type of Anesthesia:   PROPOFOL   Additional requests/questions:    Signed, Jeanann Lewandowsky   05/26/2022, 3:10 PM

## 2022-05-31 NOTE — Telephone Encounter (Signed)
Scheduled appt with APP on Monday, April 8.

## 2022-05-31 NOTE — Telephone Encounter (Signed)
   Name: JUNEANN BUSTO  DOB: 09-29-1945  MRN: [id number]  Primary Cardiologist: Glenetta Hew, MD  Chart reviewed as part of pre-operative protocol coverage. Because of Makaila R Hodgens's past medical history and time since last visit, she will require a follow-up in-office visit in order to better assess preoperative cardiovascular risk.  Pre-op covering staff: - Please schedule appointment and call patient to inform them. If patient already had an upcoming appointment within acceptable timeframe, please add "pre-op clearance" to the appointment notes so provider is aware. - Please contact requesting surgeon's office via preferred method (i.e, phone, fax) to inform them of need for appointment prior to surgery.   Mable Fill, Marissa Nestle, NP  05/31/2022, 12:07 PM

## 2022-06-02 DIAGNOSIS — J301 Allergic rhinitis due to pollen: Secondary | ICD-10-CM | POA: Diagnosis not present

## 2022-06-02 DIAGNOSIS — H6982 Other specified disorders of Eustachian tube, left ear: Secondary | ICD-10-CM | POA: Diagnosis not present

## 2022-06-02 DIAGNOSIS — H6502 Acute serous otitis media, left ear: Secondary | ICD-10-CM | POA: Diagnosis not present

## 2022-06-06 ENCOUNTER — Encounter: Payer: Self-pay | Admitting: Nurse Practitioner

## 2022-06-06 ENCOUNTER — Ambulatory Visit: Payer: Medicare HMO | Attending: Nurse Practitioner | Admitting: Nurse Practitioner

## 2022-06-06 VITALS — BP 136/80 | HR 63 | Ht 66.5 in | Wt 226.0 lb

## 2022-06-06 DIAGNOSIS — E782 Mixed hyperlipidemia: Secondary | ICD-10-CM

## 2022-06-06 DIAGNOSIS — Z85118 Personal history of other malignant neoplasm of bronchus and lung: Secondary | ICD-10-CM | POA: Diagnosis not present

## 2022-06-06 DIAGNOSIS — M79605 Pain in left leg: Secondary | ICD-10-CM

## 2022-06-06 DIAGNOSIS — M79604 Pain in right leg: Secondary | ICD-10-CM

## 2022-06-06 DIAGNOSIS — R0789 Other chest pain: Secondary | ICD-10-CM | POA: Diagnosis not present

## 2022-06-06 DIAGNOSIS — Z01810 Encounter for preprocedural cardiovascular examination: Secondary | ICD-10-CM | POA: Diagnosis not present

## 2022-06-06 DIAGNOSIS — Z902 Acquired absence of lung [part of]: Secondary | ICD-10-CM

## 2022-06-06 DIAGNOSIS — I340 Nonrheumatic mitral (valve) insufficiency: Secondary | ICD-10-CM | POA: Diagnosis not present

## 2022-06-06 DIAGNOSIS — J449 Chronic obstructive pulmonary disease, unspecified: Secondary | ICD-10-CM | POA: Diagnosis not present

## 2022-06-06 DIAGNOSIS — I480 Paroxysmal atrial fibrillation: Secondary | ICD-10-CM

## 2022-06-06 NOTE — Progress Notes (Signed)
Office Visit    Patient Name: Hannah Green Date of Encounter: 06/06/2022  Primary Care Provider:  Excell Seltzer, MD Primary Cardiologist:  Bryan Lemma, MD  Chief Complaint    77 year old female with a history of atypical chest pain, postoperative atrial fibrillation, mild mitral valve regurgitation, hyperlipidemia, severe COPD, rheumatoid arthritis, breast cancer, and squamous cell carcinoma of the right lung s/p right-sided VATS and bilobectomy who presents for follow-up and for preoperative cardiac evaluation.  Past Medical History    Past Medical History:  Diagnosis Date   Breast cancer 1999   Cataract 2017   resolved with surgery   COPD (chronic obstructive pulmonary disease)    Coronary artery disease    Dysplastic nevus 09/04/2012   R prox calf - mild    GERD (gastroesophageal reflux disease)    Hyperlipidemia    Postoperative atrial fibrillation 03/2018   Rheumatoid arthritis(714.0)    Squamous cell carcinoma of lung, stage I, right 2020   Status post right upper and middle lobectomy   Past Surgical History:  Procedure Laterality Date   BREAST LUMPECTOMY  1999   left, needed radiation   CATARACT EXTRACTION W/ INTRAOCULAR LENS  IMPLANT, BILATERAL Bilateral 05/19/15, 06/02/15   CHEST TUBE INSERTION Right 04/23/2018   Procedure: Right Lateral Chest Tube Insertion;  Surgeon: Loreli Slot, MD;  Location: [facility] OR;  Service: Thoracic;  Laterality: Right;   CHOLECYSTECTOMY     LOBECTOMY Right 04/23/2018   Procedure: RIGHT UPPER LOBECTOMY;  Surgeon: Loreli Slot, MD;  Location: [facility] OR;  Service: Thoracic;  Laterality: Right;   NM MYOVIEW LTD  01/2016   LOW RISK. Hypertensive response to exercise followed by significant drop (stage I pressure 211/70 drop to 118/75. Stage II). Apical artifact but otherwise no ischemia or infarction. EF 64%.   NODE DISSECTION Right 04/23/2018   Procedure: MEDIASTINAL NODE DISSECTION;  Surgeon: Loreli Slot, MD;   Location: [facility] OR;  Service: Thoracic;  Laterality: Right;   RIGHT/LEFT HEART CATH AND CORONARY ANGIOGRAPHY N/A 04/10/2018   Procedure: RIGHT/LEFT HEART CATH AND CORONARY ANGIOGRAPHY;  Surgeon: Marykay Lex, MD;  Location: [facility] INVASIVE CV LAB;; angiographically minimal coronary artery disease.  Normal LVEDP.  Basely normal RHC pressures with no suggestion of pulmonary hypertension.  Normal CO-CI by Fick (5.9-2.8)   TONSILLECTOMY     TRANSTHORACIC ECHOCARDIOGRAM  11/'17; 2/'20   a) Nov 2017: Mild LVH. EF 60-65%. GR 2-DD; b) Feb 2020: Normal LV size and function.  EF 60 to 65%.  No R WMA.  GR 1 DD.  Normal RV size and function.  Normal pressures.  No valvular disease.   VIDEO ASSISTED THORACOSCOPY (VATS)/WEDGE RESECTION Right 04/23/2018   Procedure: VIDEO ASSISTED THORACOSCOPY (VATS)/WEDGE RESECTION OF RIGHT UPPER LUNG;  Surgeon: Loreli Slot, MD;  Location: [facility] OR;  Service: Thoracic;  Laterality: Right;    Allergies  Allergies  Allergen Reactions   Adhesive [Tape] Rash    Paper tape causes rash   Neosporin [Neomycin-Bacitracin Zn-Polymyx] Itching   Penicillins Rash    Did it involve swelling of the face/tongue/throat, SOB, or low BP? No Did it involve sudden or severe rash/hives, skin peeling, or any reaction on the inside of your mouth or nose? Yes Did you need to seek medical attention at a hospital or doctor's office? Yes When did it last happen? From childhood If all above answers are "NO", may proceed with cephalosporin use.      Labs/Other Studies Reviewed  The following studies were reviewed today: Cath 04/10/2018: Normal coronary arteries LV end diastolic pressure is normal. (Echocardiogram showed normal EF) Normal right heart cath pressures with no evidence pulmonary pretension Normal cardiac output/index   SUMMARY Angiographically normal coronary arteries Normal Right Heart Cath Pressures with no evidence of Pulmonary Hypertension Normal Cardiac Output-Index:  5.9-2.8   --NON-ANGINAL CHEST PAIN AND DYSPNEA   RECOMMENDATIONS Discharge home after bedrest.  Standard post cath follow-up Suspect chest pain dyspnea related to pulmonary issue. No impediment to now proceed with lung surgery.     Echo 09/23/2019:  1. Global longitudinal strain is -14.6%. Left ventricular ejection  fraction, by estimation, is 70 to 75%. The left ventricle has hyperdynamic  function. The left ventricle has no regional wall motion abnormalities.  There is mild left ventricular  hypertrophy. Left ventricular diastolic parameters were normal.   2. Right ventricular systolic function is normal. The right ventricular  size is normal.   3. The mitral valve is abnormal. Mild mitral valve regurgitation.   4. The aortic valve is normal in structure. Aortic valve regurgitation is  not visualized.   5. The inferior vena cava is normal in size with greater than 50%  respiratory variability, suggesting right atrial pressure of 3 mmHg.    Recent Labs: 10/18/2021: Hemoglobin 14.3; Platelet Count 296 03/22/2022: ALT 19; BUN 19; Creatinine, Ser 0.65; Potassium 4.5; Sodium 141  Recent Lipid Panel    Component Value Date/Time   CHOL 192 03/22/2022 1033   TRIG 79.0 03/22/2022 1033   HDL 74.30 03/22/2022 1033   CHOLHDL 3 03/22/2022 1033   VLDL 15.8 03/22/2022 1033   LDLCALC 102 (H) 03/22/2022 1033   LDLDIRECT 136.2 06/03/2011 0836    History of Present Illness    77 year old female with the above past medical history including atypical chest pain, postoperative atrial fibrillation, mild mitral valve regurgitation, hyperlipidemia, severe COPD, rheumatoid arthritis, breast cancer, and squamous cell carcinoma of the right lung s/p right-sided VATS and bilobectomy.  Cardiac catheterization in 2020 showed normal coronary arteries, normal right heart pressures with no evidence of pulmonary hypertension.  Lower extremity venous Dopplers in 08/2019 in the setting of lower extremity edema  showed no evidence of DVT.  Echocardiogram at that time showed EF 70 to 75%, hyperdynamic LV, no RWMA, normal RV function, mild MR.  She was last seen in the office on 08/17/2020 and was stable from prior standpoint.  She noted ongoing intermittent bilateral lower extremity edema, occasional leg pain walking.  ABIs were normal.  She denies symptoms concerning for angina.  She presents today for follow-up and for preoperative cardiac evaluation for upcoming endoscopy with Dr. Loreta Ave on 06/20/2022.  Since her last visit she has been stable from a cardiac standpoint.  She has stable chronic dyspnea in the setting of severe COPD, unchanged from prior visits. She notes that she has occasional sharp fleeting pain in her left upper chest, not associated with exertion, also unchanged from prior visits.  She exercises regularly in the Silver sneakers program and tolerates this well.  She denies any palpitations, dizziness, edema, PND, orthopnea, weight gain.  Overall, she reports feeling well.  Home Medications    Current Outpatient Medications  Medication Sig Dispense Refill   atorvastatin (LIPITOR) 10 MG tablet Take 1 tablet (10 mg total) by mouth daily. 100 tablet 1   Budeson-Glycopyrrol-Formoterol (BREZTRI AEROSPHERE) 160-9-4.8 MCG/ACT AERO Inhale 2 puffs into the lungs daily as needed. 10.7 g 11   Cholecalciferol (VITAMIN D3) 1.25 MG (  50000 UT) CAPS Take 1 capsule by mouth once a week. 12 capsule 3   Fluticasone-Umeclidin-Vilant (TRELEGY ELLIPTA) 100-62.5-25 MCG/ACT AEPB Inhale 1 puff into the lungs daily. 1 each 11   minoxidil (LONITEN) 2.5 MG tablet TAKE 1/2 TABLET BY MOUTH EVERY DAY 45 tablet 1   No current facility-administered medications for this visit.     Review of Systems    She denies palpitations, pnd, orthopnea, n, v, dizziness, syncope, edema, weight gain, or early satiety. All other systems reviewed and are otherwise negative except as noted above.   Physical Exam    VS:  BP 136/80    Pulse 63   Ht 5' 6.5" (1.689 m)   Wt 226 lb (102.5 kg)   LMP  (LMP Unknown)   BMI 35.93 kg/m   GEN: Well nourished, well developed, in no acute distress. HEENT: normal. Neck: Supple, no JVD, carotid bruits, or masses. Cardiac: RRR, no murmurs, rubs, or gallops. No clubbing, cyanosis, edema.  Radials/DP/PT 2+ and equal bilaterally.  Respiratory:  Respirations regular and unlabored, clear to auscultation bilaterally. GI: Soft, nontender, nondistended, BS + x 4. MS: no deformity or atrophy. Skin: warm and dry, no rash. Neuro:  Strength and sensation are intact. Psych: Normal affect.  Accessory Clinical Findings    ECG personally reviewed by me today -NSR, 63 bpm- no acute changes.   Lab Results  Component Value Date   WBC 7.4 10/18/2021   HGB 14.3 10/18/2021   HCT 41.9 10/18/2021   MCV 91.3 10/18/2021   PLT 296 10/18/2021   Lab Results  Component Value Date   CREATININE 0.65 03/22/2022   BUN 19 03/22/2022   NA 141 03/22/2022   K 4.5 03/22/2022   CL 104 03/22/2022   CO2 30 03/22/2022   Lab Results  Component Value Date   ALT 19 03/22/2022   AST 18 03/22/2022   ALKPHOS 55 03/22/2022   BILITOT 0.5 03/22/2022   Lab Results  Component Value Date   CHOL 192 03/22/2022   HDL 74.30 03/22/2022   LDLCALC 102 (H) 03/22/2022   LDLDIRECT 136.2 06/03/2011   TRIG 79.0 03/22/2022   CHOLHDL 3 03/22/2022    Lab Results  Component Value Date   HGBA1C 5.9 03/22/2022    Assessment & Plan   1. Atypical chest pain: Cath in 2020 showed normal coronary arteries, normal right heart pressures with no evidence of pulmonary hypertension.  She has stable chronic dyspnea on exertion, occasional fleeting sharp chest pains that last for seconds, not associated with exertion, unchanged from prior visits. No indication for ischemic evaluation.    2. Postoperative atrial fibrillation: No documented recurrence. No longer on anticoagulation. Denies palpitations.   3. Leg pain/lower extremity  edema: Echocardiogram at that time showed EF 70 to 75%, hyperdynamic LV, no RWMA, normal RV function, mild MR. ABIs in 2022 were normal.  She still notes occasional leg pain, denies lower extremity edema. Symptoms unchanged from piror visits.   4. Mitral valve regurgitation: Mild on most recent echo in 2021.  Will repeat echo for routine monitoring.  5. Hyperlipidemia: LDL was 102 in 02/2022.  Continue Lipitor.  6. COPD/history of lung cancer: S/p right-sided VATS and bilobectomy. Stable chronic dyspnea. Follows with oncology/pulmonology.    7. Preoperative cardiac exam: According to the Revised Cardiac Risk Index (RCRI), her Perioperative Risk of Major Cardiac Event is (%): 0.4. Her Functional Capacity in METs is: 5.69 according to the [facility] Activity Status Index (DASI). Therefore, based on ACC/AHA guidelines,  patient would be at acceptable risk for the planned procedure without further cardiovascular testing. I will route this recommendation to the requesting party via Epic fax function.  8. Disposition: Follow-up in 1 year.       Joylene Grapes, NP 06/06/2022, 8:50 AM

## 2022-06-06 NOTE — Patient Instructions (Addendum)
Medication Instructions:  Your physician recommends that you continue on your current medications as directed. Please refer to the Current Medication list given to you today.  *If you need a refill on your cardiac medications before your next appointment, please call your pharmacy*   Lab Work: NONE ordered at this time of appointment   If you have labs (blood work) drawn today and your tests are completely normal, you will receive your results only by: MyChart Message (if you have MyChart) OR A paper copy in the mail If you have any lab test that is abnormal or we need to change your treatment, we will call you to review the results.   Testing/Procedures: Your physician has requested that you have an echocardiogram. Echocardiography is a painless test that uses sound waves to create images of your heart. It provides your doctor with information about the size and shape of your heart and how well your heart's chambers and valves are working. This procedure takes approximately one hour. There are no restrictions for this procedure. Please do NOT wear cologne, perfume, aftershave, or lotions (deodorant is allowed). Please arrive 15 minutes prior to your appointment time.     Follow-Up: At [facility], you and your health needs are our priority.  As part of our continuing mission to provide you with exceptional heart care, we have created designated Provider Care Teams.  These Care Teams include your primary Cardiologist (physician) and Advanced Practice Providers (APPs -  Physician Assistants and Nurse Practitioners) who all work together to provide you with the care you need, when you need it.  We recommend signing up for the patient portal called "MyChart".  Sign up information is provided on this After Visit Summary.  MyChart is used to connect with patients for Virtual Visits (Telemedicine).  Patients are able to view lab/test results, encounter notes, upcoming appointments, etc.   Non-urgent messages can be sent to your provider as well.   To learn more about what you can do with MyChart, go to [email or web address].    Your next appointment:   1 year(s)  Provider:   Bryan Lemma, MD     Other Instructions

## 2022-06-21 ENCOUNTER — Other Ambulatory Visit: Payer: Self-pay | Admitting: Gastroenterology

## 2022-06-22 ENCOUNTER — Encounter (HOSPITAL_COMMUNITY): Payer: Self-pay | Admitting: Gastroenterology

## 2022-06-22 NOTE — Progress Notes (Signed)
Attempted to obtain medical history via telephone, unable to reach at this time. HIPAA compliant voicemail message left requesting return call to pre surgical testing department. 

## 2022-06-23 ENCOUNTER — Encounter (HOSPITAL_COMMUNITY): Payer: Self-pay | Admitting: Gastroenterology

## 2022-06-23 DIAGNOSIS — R1314 Dysphagia, pharyngoesophageal phase: Secondary | ICD-10-CM | POA: Diagnosis not present

## 2022-06-23 DIAGNOSIS — H6983 Other specified disorders of Eustachian tube, bilateral: Secondary | ICD-10-CM | POA: Diagnosis not present

## 2022-06-23 NOTE — Anesthesia Preprocedure Evaluation (Addendum)
Anesthesia Evaluation  Patient identified by MRN, date of birth, ID band Patient awake    Reviewed: Allergy & Precautions, H&P , NPO status , Patient's Chart, lab work & pertinent test results  Airway Mallampati: II  TM Distance: >3 FB Neck ROM: Full    Dental no notable dental hx. (+) Teeth Intact, Dental Advisory Given   Pulmonary COPD,  COPD inhaler, former smoker Squamous cell CA of lung S/P R sided Vats w bilobectomy   Pulmonary exam normal breath sounds clear to auscultation       Cardiovascular + CAD (hxx of atypical chest pain see consult 4/8)  negative cardio ROS Normal cardiovascular exam+ dysrhythmias Atrial Fibrillation  Rhythm:Regular Rate:Normal  Nl EF   Neuro/Psych negative neurological ROS  negative psych ROS   GI/Hepatic negative GI ROS, Neg liver ROS,GERD  ,,  Endo/Other  negative endocrine ROS    Renal/GU negative Renal ROS  negative genitourinary   Musculoskeletal negative musculoskeletal ROS (+) Arthritis , Rheumatoid disorders,    Abdominal  (+) + obese (BMI  35.9)  Peds negative pediatric ROS (+)  Hematology negative hematology ROS (+)   Anesthesia Other Findings All: neosoprin, pcn  Reproductive/Obstetrics negative OB ROS                             Anesthesia Physical Anesthesia Plan  ASA: 3  Anesthesia Plan: MAC   Post-op Pain Management: Minimal or no pain anticipated   Induction: Intravenous  PONV Risk Score and Plan: Propofol infusion  Airway Management Planned: Natural Airway and Nasal Cannula  Additional Equipment:   Intra-op Plan:   Post-operative Plan:   Informed Consent: I have reviewed the patients History and Physical, chart, labs and discussed the procedure including the risks, benefits and alternatives for the proposed anesthesia with the patient or authorized representative who has indicated his/her understanding and acceptance.      Dental advisory given  Plan Discussed with:   Anesthesia Plan Comments: (EGD for Dysphagia Barium swallow)        Anesthesia Quick Evaluation

## 2022-06-24 ENCOUNTER — Ambulatory Visit (HOSPITAL_COMMUNITY): Payer: Medicare HMO | Admitting: Anesthesiology

## 2022-06-24 ENCOUNTER — Encounter (HOSPITAL_COMMUNITY)
Admission: RE | Disposition: A | Discharge status: Home / Self Care | Payer: Self-pay | Source: Home / Self Care | Attending: Gastroenterology

## 2022-06-24 ENCOUNTER — Ambulatory Visit (HOSPITAL_COMMUNITY)
Admission: RE | Admit: 2022-06-24 | Discharge: 2022-06-24 | Disposition: A | Discharge status: Home / Self Care | Payer: Medicare HMO | Attending: Gastroenterology | Admitting: Gastroenterology

## 2022-06-24 ENCOUNTER — Encounter (HOSPITAL_COMMUNITY): Payer: Self-pay | Admitting: Gastroenterology

## 2022-06-24 ENCOUNTER — Other Ambulatory Visit: Payer: Self-pay

## 2022-06-24 DIAGNOSIS — K222 Esophageal obstruction: Secondary | ICD-10-CM | POA: Diagnosis not present

## 2022-06-24 DIAGNOSIS — Z87891 Personal history of nicotine dependence: Secondary | ICD-10-CM | POA: Insufficient documentation

## 2022-06-24 DIAGNOSIS — Z902 Acquired absence of lung [part of]: Secondary | ICD-10-CM | POA: Insufficient documentation

## 2022-06-24 DIAGNOSIS — K219 Gastro-esophageal reflux disease without esophagitis: Secondary | ICD-10-CM | POA: Insufficient documentation

## 2022-06-24 DIAGNOSIS — J449 Chronic obstructive pulmonary disease, unspecified: Secondary | ICD-10-CM | POA: Insufficient documentation

## 2022-06-24 DIAGNOSIS — K5909 Other constipation: Secondary | ICD-10-CM | POA: Insufficient documentation

## 2022-06-24 DIAGNOSIS — I2510 Atherosclerotic heart disease of native coronary artery without angina pectoris: Secondary | ICD-10-CM

## 2022-06-24 DIAGNOSIS — K449 Diaphragmatic hernia without obstruction or gangrene: Secondary | ICD-10-CM | POA: Diagnosis not present

## 2022-06-24 DIAGNOSIS — Z85118 Personal history of other malignant neoplasm of bronchus and lung: Secondary | ICD-10-CM | POA: Diagnosis not present

## 2022-06-24 DIAGNOSIS — R1310 Dysphagia, unspecified: Secondary | ICD-10-CM | POA: Insufficient documentation

## 2022-06-24 HISTORY — PX: SAVORY DILATION: SHX5439

## 2022-06-24 HISTORY — PX: ESOPHAGOGASTRODUODENOSCOPY (EGD) WITH PROPOFOL: SHX5813

## 2022-06-24 SURGERY — ESOPHAGOGASTRODUODENOSCOPY (EGD) WITH PROPOFOL
Anesthesia: Monitor Anesthesia Care

## 2022-06-24 MED ORDER — PHENYLEPHRINE HCL (PRESSORS) 10 MG/ML IV SOLN
INTRAVENOUS | Status: AC
  Filled 2022-06-24: qty 1

## 2022-06-24 MED ORDER — PROPOFOL 500 MG/50ML IV EMUL
INTRAVENOUS | Status: DC | PRN
  Administered 2022-06-24: 50 mg via INTRAVENOUS
  Administered 2022-06-24: 125 ug/kg/min via INTRAVENOUS

## 2022-06-24 MED ORDER — PROPOFOL 10 MG/ML IV BOLUS
INTRAVENOUS | Status: AC
  Filled 2022-06-24: qty 20

## 2022-06-24 MED ORDER — SODIUM CHLORIDE 0.9 % IV SOLN: INTRAVENOUS | Status: DC

## 2022-06-24 MED ORDER — LIDOCAINE HCL (CARDIAC) PF 100 MG/5ML IV SOSY
PREFILLED_SYRINGE | INTRAVENOUS | Status: DC | PRN
  Administered 2022-06-24: 80 mg via INTRAVENOUS

## 2022-06-24 MED ORDER — LACTATED RINGERS IV SOLN
INTRAVENOUS | Status: AC | PRN
  Administered 2022-06-24: 1000 mL via INTRAVENOUS

## 2022-06-24 SURGICAL SUPPLY — 15 items

## 2022-06-24 NOTE — Anesthesia Postprocedure Evaluation (Signed)
Anesthesia Post Note  Patient: Hannah Green  Procedure(s) Performed: ESOPHAGOGASTRODUODENOSCOPY (EGD) WITH PROPOFOL SAVORY DILATION     Patient location during evaluation: Endoscopy Anesthesia Type: MAC Level of consciousness: awake and alert Pain management: pain level controlled Vital Signs Assessment: post-procedure vital signs reviewed and stable Respiratory status: spontaneous breathing, nonlabored ventilation, respiratory function stable and patient connected to nasal cannula oxygen Cardiovascular status: blood pressure returned to baseline and stable Postop Assessment: no apparent nausea or vomiting Anesthetic complications: no  No notable events documented.  Last Vitals:  Vitals:   06/24/22 0800 06/24/22 0810  BP: (!) 171/54 (!) 174/73  Pulse: 69 67  Resp: 16 20  Temp:    SpO2: 100% 97%    Last Pain:  Vitals:   06/24/22 0810  TempSrc:   PainSc: 2                  Trevor Iha

## 2022-06-24 NOTE — H&P (Signed)
Hannah Green HPI: This 77 year old white female presents to the office for further evaluation of difficulty swallowing that she has had for the past 6 months. She is accompanied to the office by friend who is helping with her history.  Patient seems confused about her medical details and cannot give me her history with accuracy. She has also had problems with acid reflux and heartburn almost every day during this period of time she tends to vomit 3-4 times a week.  She apparently was taking a large amount of Tums to help with her symptoms but is not on any PPI according to her medication list presented to the office today. She suffers from chronic constipation and averages 1 bowel movement every 3 to 4 days. She takes laxative for symptomatic relief.  There is no obvious blood or mucus in the stool. She has a good appetite and her weight has been stable. She denies having any complaints of abdominal pain, nausea or odynophagia. She denies having a family history of colon cancer, celiac sprue or IBD.  Her last colonoscopy was done on 11/22/2010 when pandiverticulosis was noted along with small internal hemorrhoids.  She had a swallowing study done on 05/04/2022 when she was diagnosed with functional oropharyngeal dysphagia. She was advised to cut her meats up into small pieces and chew well.  History is complicated by the fact that she has severe COPD and had Stage Ia squamous cell carcinoma of the right upper lobe resulting in a right-sided VATS bilobectomy complicated by post-operative atrial fibrillation. Her last colonoscopy was done on 11/12/2010 and a tubular adenoma was removed.  Patient did not show up for any follow-up colonoscopies in spite of several reminders.   Past Medical History:  Diagnosis Date   Breast cancer (HCC) 1999   Cataract 2017   resolved with surgery   COPD (chronic obstructive pulmonary disease) (HCC)    Coronary artery disease    Dysplastic nevus 09/04/2012   R prox calf -  mild    GERD (gastroesophageal reflux disease)    Hyperlipidemia    Postoperative atrial fibrillation (HCC) 03/2018   Rheumatoid arthritis(714.0)    Squamous cell carcinoma of lung, stage I, right (HCC) 2020   Status post right upper and middle lobectomy    Past Surgical History:  Procedure Laterality Date   BREAST LUMPECTOMY  1999   left, needed radiation   CATARACT EXTRACTION W/ INTRAOCULAR LENS  IMPLANT, BILATERAL Bilateral 05/19/15, 06/02/15   CHEST TUBE INSERTION Right 04/23/2018   Procedure: Right Lateral Chest Tube Insertion;  Surgeon: Loreli Slot, MD;  Location: [facility] OR;  Service: Thoracic;  Laterality: Right;   CHOLECYSTECTOMY     LOBECTOMY Right 04/23/2018   Procedure: RIGHT UPPER LOBECTOMY;  Surgeon: Loreli Slot, MD;  Location: [facility] OR;  Service: Thoracic;  Laterality: Right;   NM MYOVIEW LTD  01/2016   LOW RISK. Hypertensive response to exercise followed by significant drop (stage I pressure 211/70 drop to 118/75. Stage II). Apical artifact but otherwise no ischemia or infarction. EF 64%.   NODE DISSECTION Right 04/23/2018   Procedure: MEDIASTINAL NODE DISSECTION;  Surgeon: Loreli Slot, MD;  Location: [facility] OR;  Service: Thoracic;  Laterality: Right;   RIGHT/LEFT HEART CATH AND CORONARY ANGIOGRAPHY N/A 04/10/2018   Procedure: RIGHT/LEFT HEART CATH AND CORONARY ANGIOGRAPHY;  Surgeon: Marykay Lex, MD;  Location: [facility] INVASIVE CV LAB;; angiographically minimal coronary artery disease.  Normal LVEDP.  Basely normal RHC pressures with no suggestion  of pulmonary hypertension.  Normal CO-CI by Fick (5.9-2.8)   TONSILLECTOMY     TRANSTHORACIC ECHOCARDIOGRAM  11/'17; 2/'20   a) Nov 2017: Mild LVH. EF 60-65%. GR 2-DD; b) Feb 2020: Normal LV size and function.  EF 60 to 65%.  No R WMA.  GR 1 DD.  Normal RV size and function.  Normal pressures.  No valvular disease.   VIDEO ASSISTED THORACOSCOPY (VATS)/WEDGE RESECTION Right 04/23/2018   Procedure: VIDEO ASSISTED  THORACOSCOPY (VATS)/WEDGE RESECTION OF RIGHT UPPER LUNG;  Surgeon: Loreli Slot, MD;  Location: [facility] OR;  Service: Thoracic;  Laterality: Right;    Family History  Problem Relation Age of Onset   Cancer Mother        breast   Cancer Father        lung cancer   Cancer Sister        throat cancer   Diabetes Paternal Uncle    Diabetes Maternal Grandmother     Social History:  reports that she quit smoking about 11 years ago. Her smoking use included cigarettes. She has a 75.00 pack-year smoking history. She has never used smokeless tobacco. She reports current alcohol use of about 2.0 standard drinks of alcohol per week. She reports that she does not use drugs.  Allergies:  Allergies  Allergen Reactions   Adhesive [Tape] Rash    Paper tape causes rash   Neosporin [Neomycin-Bacitracin Zn-Polymyx] Itching   Penicillins Rash    Did it involve swelling of the face/tongue/throat, SOB, or low BP? No Did it involve sudden or severe rash/hives, skin peeling, or any reaction on the inside of your mouth or nose? Yes Did you need to seek medical attention at a hospital or doctor's office? Yes When did it last happen? From childhood If all above answers are "NO", may proceed with cephalosporin use.     Medications: Scheduled: Continuous:  lactated ringers 10 mL/hr at 06/24/22 1610    No results found for this or any previous visit (from the past 24 hour(s)).   No results found.  ROS:  As stated above in the HPI otherwise negative.  Pulse 75, temperature 97.8 F (36.6 C), temperature source Tympanic, resp. rate 15, height 5' 6.5" (1.689 m), weight 102.5 kg, SpO2 100 %.    PE: Gen: NAD, Alert and Oriented HEENT:  [place]/AT, EOMI Neck: Supple, no LAD Lungs: CTA Bilaterally CV: RRR without M/G/R ABD: Soft, NTND, +BS Ext: No C/C/E  Assessment/Plan: 1) Dysphagia - EGD with dilation.  Hannah Green D 06/24/2022, 7:23 AM

## 2022-06-24 NOTE — Discharge Instructions (Signed)
YOU HAD AN ENDOSCOPIC PROCEDURE TODAY: Refer to the procedure report and other information in the discharge instructions given to you for any specific questions about what was found during the examination. If this information does not answer your questions, please call [facility] office at [phone] to clarify.  ° °YOU SHOULD EXPECT: Some feelings of bloating in the abdomen. Passage of more gas than usual. Walking can help get rid of the air that was put into your GI tract during the procedure and reduce the bloating. If you had a lower endoscopy (such as a colonoscopy or flexible sigmoidoscopy) you may notice spotting of blood in your stool or on the toilet paper. Some abdominal soreness may be present for a day or two, also. ° °DIET: Your first meal following the procedure should be a light meal and then it is ok to progress to your normal diet. A half-sandwich or bowl of soup is an example of a good first meal. Heavy or fried foods are harder to digest and may make you feel nauseous or bloated. Drink plenty of fluids but you should avoid alcoholic beverages for 24 hours. If you had a esophageal dilation, please see attached instructions for diet.   ° °ACTIVITY: Your care partner should take you home directly after the procedure. You should plan to take it easy, moving slowly for the rest of the day. You can resume normal activity the day after the procedure however YOU SHOULD NOT DRIVE, use power tools, machinery or perform tasks that involve climbing or major physical exertion for 24 hours (because of the sedation medicines used during the test).  ° °SYMPTOMS TO REPORT IMMEDIATELY: °A gastroenterologist can be reached at any hour. Please call [phone]  for any of the following symptoms:  °Following lower endoscopy (colonoscopy, flexible sigmoidoscopy) °Excessive amounts of blood in the stool  °Significant tenderness, worsening of abdominal pains  °Swelling of the abdomen that is new, acute  °Fever of 100° or  higher  °Following upper endoscopy (EGD, EUS, ERCP, esophageal dilation) °Vomiting of blood or coffee ground material  °New, significant abdominal pain  °New, significant chest pain or pain under the shoulder blades  °Painful or persistently difficult swallowing  °New shortness of breath  °Black, tarry-looking or red, bloody stools ° °FOLLOW UP:  °If any biopsies were taken you will be contacted by phone or by letter within the next 1-3 weeks. Call [phone]  if you have not heard about the biopsies in 3 weeks.  °Please also call with any specific questions about appointments or follow up tests. ° °

## 2022-06-24 NOTE — Op Note (Signed)
[facility] Patient Name: Hannah Green Procedure Date: 06/24/2022 MRN: [id number] Attending MD: Jeani Hawking , MD, [id number] Date of Birth: 08/02/1945 CSN: [id number] Age: 77 Admit Type: Outpatient Procedure:                Upper GI endoscopy Indications:              Dysphagia Providers:                Jeani Hawking, MD, Fransisca Connors, Leanne Lovely, Technician Referring MD:              Medicines:                Propofol per Anesthesia Complications:            No immediate complications. Estimated Blood Loss:     Estimated blood loss: none. Procedure:                Pre-Anesthesia Assessment:                           - Prior to the procedure, a History and Physical                            was performed, and patient medications and                            allergies were reviewed. The patient's tolerance of                            previous anesthesia was also reviewed. The risks                            and benefits of the procedure and the sedation                            options and risks were discussed with the patient.                            All questions were answered, and informed consent                            was obtained. Prior Anticoagulants: The patient has                            taken no anticoagulant or antiplatelet agents. ASA                            Grade Assessment: III - A patient with severe                            systemic disease. After reviewing the risks and                            benefits, the  patient was deemed in satisfactory                            condition to undergo the procedure.                           - Sedation was administered by an anesthesia                            professional. Deep sedation was attained.                           After obtaining informed consent, the endoscope was                            passed under direct vision. Throughout the                             procedure, the patient's blood pressure, pulse, and                            oxygen saturations were monitored continuously. The                            GIF-H190 ([id number]) Olympus endoscope was introduced                            through the mouth, and advanced to the second part                            of duodenum. The upper GI endoscopy was                            accomplished without difficulty. The patient                            tolerated the procedure well. Scope In: Scope Out: Findings:      One benign-appearing, intrinsic mild stenosis was found at the       gastroesophageal junction. This stenosis measured 1.5 cm (inner       diameter) x less than one cm (in length). The stenosis was traversed. A       guidewire was placed and the scope was withdrawn. Dilation was performed       with a Savary dilator with no resistance at 18 mm. The dilation site was       examined following endoscope reinsertion and showed mild mucosal       disruption. Estimated blood loss was minimal.      A 5 cm hiatal hernia was present.      The gastroesophageal flap valve was visualized endoscopically and       classified as Hill Grade IV (no fold, wide open lumen, hiatal hernia       present).      The stomach was normal.      The examined duodenum was normal.      Subjectively, in the distal esophagus, the esophagus appeared to  be       "spasmed". There was moderate difficulty with passing the endoscope.       There was also the possibility of a mild stenosis with the initial       visualization. Dilation with the 16 mm, 17 mm, and 18 mm Savary dilators       were performed without any resistance. A relook EGD showed a mild       mucosal disruption at the GE junction. Impression:               - Benign-appearing esophageal stenosis. Dilated.                           - 5 cm hiatal hernia.                           - Gastroesophageal flap valve classified as  Hill                            Grade IV (no fold, wide open lumen, hiatal hernia                            present).                           - Normal stomach.                           - Normal examined duodenum.                           - No specimens collected. Moderate Sedation:      Not Applicable - Patient had care per Anesthesia. Recommendation:           - Patient has a contact number available for                            emergencies. The signs and symptoms of potential                            delayed complications were discussed with the                            patient. Return to normal activities tomorrow.                            Written discharge instructions were provided to the                            patient.                           - Resume previous diet.                           - Continue present medications.                           -  Follow up with Dr. Loreta Ave in 1 month. Procedure Code(s):        --- Professional ---                           [phone], Esophagogastroduodenoscopy, flexible,                            transoral; with insertion of guide wire followed by                            passage of dilator(s) through esophagus over guide                            wire Diagnosis Code(s):        --- Professional ---                           K22.2, Esophageal obstruction                           K44.9, Diaphragmatic hernia without obstruction or                            gangrene                           R13.10, Dysphagia, unspecified CPT copyright 2022 American Medical Association. All rights reserved. The codes documented in this report are preliminary and upon coder review may  be revised to meet current compliance requirements. Jeani Hawking, MD Jeani Hawking, MD 06/24/2022 7:56:24 AM This report has been signed electronically. Number of Addenda: 0

## 2022-06-24 NOTE — Transfer of Care (Signed)
Immediate Anesthesia Transfer of Care Note  Patient: Hannah Green  Procedure(s) Performed: Procedure(s): ESOPHAGOGASTRODUODENOSCOPY (EGD) WITH PROPOFOL (N/A) SAVORY DILATION (N/A)  Patient Location: PACU  Anesthesia Type:MAC  Level of Consciousness:  sedated, patient cooperative and responds to stimulation  Airway & Oxygen Therapy:Patient Spontanous Breathing and Patient connected to face mask oxgen  Post-op Assessment:  Report given to PACU RN and Post -op Vital signs reviewed and stable  Post vital signs:  Reviewed and stable  Last Vitals:  Vitals:   06/24/22 0640  Pulse: 75  Resp: 15  Temp: 36.6 C  SpO2: 100%    Complications: No apparent anesthesia complications

## 2022-06-27 ENCOUNTER — Encounter (HOSPITAL_COMMUNITY): Payer: Self-pay | Admitting: Gastroenterology

## 2022-07-05 ENCOUNTER — Ambulatory Visit (HOSPITAL_COMMUNITY): Payer: Medicare HMO | Attending: Cardiology

## 2022-07-05 DIAGNOSIS — I080 Rheumatic disorders of both mitral and aortic valves: Secondary | ICD-10-CM

## 2022-07-05 DIAGNOSIS — I517 Cardiomegaly: Secondary | ICD-10-CM

## 2022-07-05 DIAGNOSIS — I340 Nonrheumatic mitral (valve) insufficiency: Secondary | ICD-10-CM | POA: Insufficient documentation

## 2022-07-05 LAB — ECHOCARDIOGRAM COMPLETE
Area-P 1/2: 3.99 cm2
S' Lateral: 3.1 cm

## 2022-07-11 ENCOUNTER — Telehealth: Payer: Self-pay

## 2022-07-11 NOTE — Telephone Encounter (Signed)
Lmom to discuss echo results. Waiting on a return call.  

## 2022-07-11 NOTE — Telephone Encounter (Signed)
Pt returned call and was notified of echo results. Pt will continue current medication and f/u as planned.

## 2022-07-12 ENCOUNTER — Ambulatory Visit: Payer: Medicare HMO | Admitting: Dermatology

## 2022-07-12 VITALS — BP 139/69

## 2022-07-12 DIAGNOSIS — L720 Epidermal cyst: Secondary | ICD-10-CM

## 2022-07-12 DIAGNOSIS — L650 Telogen effluvium: Secondary | ICD-10-CM

## 2022-07-12 DIAGNOSIS — L719 Rosacea, unspecified: Secondary | ICD-10-CM | POA: Diagnosis not present

## 2022-07-12 DIAGNOSIS — Z79899 Other long term (current) drug therapy: Secondary | ICD-10-CM | POA: Diagnosis not present

## 2022-07-12 DIAGNOSIS — Z7189 Other specified counseling: Secondary | ICD-10-CM

## 2022-07-12 MED ORDER — MINOXIDIL 2.5 MG PO TABS: 2.5000 mg | ORAL_TABLET | Freq: Every day | ORAL | 1 refills | Status: DC

## 2022-07-12 MED ORDER — IVERMECTIN 1 % EX CREA: 1.0000 | TOPICAL_CREAM | CUTANEOUS | 11 refills | Status: AC

## 2022-07-12 NOTE — Patient Instructions (Addendum)
Start Differin 0.1% cream (over the counter) to face nightly for white bumps Continue the Triple cream nightly (over the Differin) and can use in the morning as well Increase Minoxidil to Minoxidil 2.5mg  1 pill a day  Instructions for [facility] Medications  One or more of your medications was sent to the [facility] mail order compounding pharmacy. You will receive an email from them and can purchase the medicine through that link. It will then be mailed to your home at the address you confirmed. If for any reason you do not receive an email from them, please check your spam folder. If you still do not find the email, please let us know. [facility] phone number is [phone].      Due to recent changes in healthcare laws, you may see results of your pathology and/or laboratory studies on MyChart before the doctors have had a chance to review them. We understand that in some cases there may be results that are confusing or concerning to you. Please understand that not all results are received at the same time and often the doctors may need to interpret multiple results in order to provide you with the best plan of care or course of treatment. Therefore, we ask that you please give [place] 2 business days to thoroughly review all your results before contacting the office for clarification. Should we see a critical lab result, you will be contacted sooner.   If You Need Anything After Your Visit  If you have any questions or concerns for your doctor, please call our main line at [phone] and press option 4 to reach your doctor's medical assistant. If no one answers, please leave a voicemail as directed and we will return your call as soon as possible. Messages left after 4 pm will be answered the following business day.   You may also send [place] a message via MyChart. We typically respond to MyChart messages within 1-2 business days.  For prescription refills, please ask your pharmacy  to contact our office. Our fax number is [phone].  If you have an urgent issue when the clinic is closed that cannot wait until the next business day, you can page your doctor at the number below.    Please note that while we do our best to be available for urgent issues outside of office hours, we are not available 24/7.   If you have an urgent issue and are unable to reach [place], you may choose to seek medical care at your doctor's office, retail clinic, urgent care center, or emergency room.  If you have a medical emergency, please immediately call 911 or go to the emergency department.  Pager Numbers  - Dr. Gwen Pounds: [phone]  - Dr. Neale Burly: [phone]  - Dr. Roseanne Reno: [phone]  In the event of inclement weather, please call our main line at [phone] for an update on the status of any delays or closures.  Dermatology Medication Tips: Please keep the boxes that topical medications come in in order to help keep track of the instructions about where and how to use these. Pharmacies typically print the medication instructions only on the boxes and not directly on the medication tubes.   If your medication is too expensive, please contact our office at [phone] option 4 or send [place] a message through MyChart.   We are unable to tell what your co-pay for medications will be in advance as this is different depending on your insurance coverage. However, we may  be able to find a substitute medication at lower cost or fill out paperwork to get insurance to cover a needed medication.   If a prior authorization is required to get your medication covered by your insurance company, please allow [place] 1-2 business days to complete this process.  Drug prices often vary depending on where the prescription is filled and some pharmacies may offer cheaper prices.  The website www.goodrx.com contains coupons for medications through different pharmacies. The prices here do not account for  what the cost may be with help from insurance (it may be cheaper with your insurance), but the website can give you the price if you did not use any insurance.  - You can print the associated coupon and take it with your prescription to the pharmacy.  - You may also stop by our office during regular business hours and pick up a GoodRx coupon card.  - If you need your prescription sent electronically to a different pharmacy, notify our office through [facility] or by phone at [phone] option 4.     Si Usted Necesita Algo Despus de Su Visita  Tambin puede enviarnos un mensaje a travs de Clinical cytogeneticist. Por lo general respondemos a los mensajes de MyChart en el transcurso de 1 a 2 das hbiles.  Para renovar recetas, por favor pida a su farmacia que se ponga en contacto con nuestra oficina. Annie Sable de fax es [place] [phone].  Si tiene un asunto urgente cuando la clnica est cerrada y que no puede esperar hasta el siguiente da hbil, puede llamar/localizar a su doctor(a) al nmero que aparece a continuacin.   Por favor, tenga en cuenta que aunque hacemos todo lo posible para estar disponibles para asuntos urgentes fuera del horario de [place], no estamos disponibles las 24 horas del da, los 7 [address]  [address] de la [place].   Si tiene un problema urgente y no puede comunicarse con nosotros, puede optar por buscar atencin mdica  en el consultorio de su doctor(a), en una clnica privada, en un centro de atencin urgente o en una sala de emergencias.  Si tiene Engineer, drilling, por favor llame inmediatamente al 911 o vaya a la sala de emergencias.  Nmeros de bper  - Dr. Gwen Pounds: [phone]  - Dra. Moye: [phone]  - Dra. Roseanne Reno: [phone]  En caso de inclemencias del [place], por favor llame a Lacy Duverney principal al [phone] para una actualizacin sobre el [place] de cualquier retraso o cierre.  Consejos para la medicacin en dermatologa: Por favor, guarde  las cajas en las que vienen los medicamentos de uso tpico para ayudarle a seguir las instrucciones sobre dnde y cmo usarlos. Las farmacias generalmente imprimen las instrucciones del medicamento slo en las cajas y no directamente en los tubos del [place].   Si su medicamento es muy caro, por favor, pngase en contacto con Rolm Gala llamando al [phone] y presione la opcin 4 o envenos un mensaje a travs de Clinical cytogeneticist.   No podemos decirle cul ser su copago por los medicamentos por adelantado ya que esto es diferente dependiendo de la cobertura de su seguro. Sin embargo, es posible que podamos encontrar un medicamento sustituto a Audiological scientist un formulario para que el seguro cubra el medicamento que se considera necesario.   Si se requiere una autorizacin previa para que su compaa de seguros [place] su medicamento, por favor permtanos de 1 a 2 das hbiles para completar [address]  Los precios de los medicamentos varan  con frecuencia dependiendo del lugar de dnde se surte la receta y alguna farmacias pueden ofrecer precios ms baratos.  El sitio web www.goodrx.com tiene cupones para medicamentos de Airline pilot. Los precios aqu no tienen en cuenta lo que podra costar con la ayuda del seguro (puede ser ms barato con su seguro), pero el sitio web puede darle el precio si no utiliz Research scientist (physical sciences).  - Puede imprimir el cupn correspondiente y llevarlo con su receta a la farmacia.  - Tambin puede pasar por nuestra oficina durante el horario de atencin regular y Charity fundraiser una tarjeta de cupones de GoodRx.  - Si necesita que su receta se enve electrnicamente a una farmacia diferente, informe a nuestra oficina a travs de MyChart de [place] o por telfono llamando al [phone] y presione la opcin 4.

## 2022-07-12 NOTE — Progress Notes (Signed)
Follow-Up Visit   Subjective  Hannah Green is a 77 y.o. 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  The following portions of the chart were reviewed this encounter and updated as appropriate: medications, allergies, medical history  Review of Systems:  No other skin or systemic complaints except as noted in HPI or Assessment and Plan.  Objective  Well appearing patient in no apparent distress; mood and affect are within normal limits. A focused examination was performed of the following areas: scalp Relevant exam findings are noted in the Assessment and Plan.         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 Condition is symptomatic / bothersome to patient. Not to goal, but improving. No side effects from medication. No ankle swelling  Exam: Diffuse thinning of hair, positive hair pull test.  Telogen effluvium is a benign, self-limited condition causing increased hair shedding usually for several months. It does not progress to baldness, and the hair eventually grows back on its own. It can be triggered by recent illness, recent surgery, thyroid disease, low iron stores, vitamin D deficiency, fad diets or rapid weight loss, hormonal changes such as pregnancy or birth control pills, and some medication. Usually the hair loss starts 2-3 months after the illness or health change. Rarely, it can continue for longer than a year. Treatments options may include oral or topical Minoxidil; Red Light scalp treatments; Biotin 2.5 mg daily and other options.  Treatment Plan: Increase to Minoxidil 2.5mg  1 po qd  Discussed adding Propecia in the future  Doses of minoxidil for hair loss are considered 'low dose'. This is because the doses used for hair loss are much  lower than the doses which are used for conditions such as high blood pressure (hypertension). The doses used for hypertension are 10-40mg  per day.  Side effects are uncommon at the low doses (up to 2.5 mg/day) used to treat hair loss. Potential side effects, more commonly seen at higher doses, include: Increase in hair growth (hypertrichosis) elsewhere on face and body Temporary hair shedding upon starting medication which may last up to 4 weeks Ankle swelling, fluid retention, rapid weight gain more than 5 pounds Low blood pressure and feeling lightheaded or dizzy when standing up quickly Fast or irregular heartbeat Headaches   Milia - tiny firm white papules - type of cyst - benign - may be extracted if symptomatic - Start Differin 0.1% cr qhs to face   ROSACEA Exam Mid face erythema with telangiectasias   Rosacea is a chronic progressive skin condition usually affecting the face of adults, causing redness and/or acne bumps. It is treatable but not curable. It sometimes affects the eyes (ocular rosacea) as well. It may respond to topical and/or systemic medication and can flare with stress, sun exposure, alcohol, exercise, topical steroids (including hydrocortisone/cortisone 10) and some foods.  Daily application of broad spectrum spf 30+ sunscreen to face is recommended to reduce flares.  Treatment Plan Cont [facility] Triple Cream qd/bid   Return in about 4 months (around 11/12/2022) for Telogen Effluvium.  I, Ardis Rowan, RMA, am acting as scribe for Armida Sans, MD .  Documentation: I have reviewed the above documentation for accuracy and completeness, and I agree with the above.  Armida Sans, MD

## 2022-07-19 ENCOUNTER — Encounter: Payer: Self-pay | Admitting: Dermatology

## 2022-07-28 DIAGNOSIS — K5730 Diverticulosis of large intestine without perforation or abscess without bleeding: Secondary | ICD-10-CM | POA: Diagnosis not present

## 2022-07-28 DIAGNOSIS — K219 Gastro-esophageal reflux disease without esophagitis: Secondary | ICD-10-CM | POA: Diagnosis not present

## 2022-07-28 DIAGNOSIS — Z1211 Encounter for screening for malignant neoplasm of colon: Secondary | ICD-10-CM | POA: Diagnosis not present

## 2022-07-28 DIAGNOSIS — K449 Diaphragmatic hernia without obstruction or gangrene: Secondary | ICD-10-CM | POA: Diagnosis not present

## 2022-09-18 NOTE — Progress Notes (Unsigned)
Cardiology Office Note:  .   Date:  09/20/2022  ID:  Hannah Green, DOB 1945-11-29, MRN [id number] PCP: Excell Seltzer, MD  [facility] Providers Cardiologist MD / APP:  Bryan Lemma, MD   Perry Mount, NP Pulmonologist: Dr. Wendall Mola Chief Complaint  Patient presents with   Shortness of Breath    Exertional dyspnea, worse over the last couple weeks.  Echo results reviewed.    History of Present Illness: .     Hannah Green is a moderately obese 77 y.o. female with a PMH notable for severe COPD (Gold 4) with stage Ia SCCA of RUL (s/p R sided VATS bilobectomy-complicated by brief episode of postop A-fib), RA, history of BrCA normal Echo and R/LHC who presents here for 3-4 1 follow-up at the request of Excell Seltzer, MD.  I have not seen Hannah Green since July 2021.  She has been seen several times by cardiology APP's in the interim. Hannah Green was most recently seen on 06/06/2022 by Bernadene Person, NP for preop evaluation for upcoming endoscopy by Dr. Loreta Ave scheduled for 06/20/2022.  She was stable cardiac endpoint.  Stable chronic dyspnea from COPD.  Occasional sharp fleeting pain but not associated with exertion.  Unchanged regular exercise Silver Sneakers program-tolerating well.  No palpitations, PND orthopnea or edema.  No chest pain.  No indication for ischemic evaluation.  No recurrence of A-fib therefore not a diagnosis going forward.  2D echo ordered for new baseline, but "cleared "for colonoscopy.   Subjective   INTERVAL HISTORY Hannah Green returns for ~3-31-month follow-up.  She says she is doing well but about a week and a half ago things started went poorly.  She just said her leg started getting tight, painful and swollen and she just felt poorly since then.  She has felt awful.  No energy more than usual exertional dyspnea.  More sedentary.  No chest pain or pressure.  She notes more than baseline DOE & resting Dyspnea -- always since Lung Sgx - but  using inhaler more frequently -- >? Due to heart *& humidity. Bilateral Leg swelling & PM cramping. - better today, not necessarily going down with elevation - today is the best since 1 1/2 wk ago. 1-2 x / day L arm numbness up to arm.   No PND/Orthopnea to go with Edema No palpitations/ heart racing + dizzy - but no near syncope/syncope.   Can be from lying to sitting, going downstairs or lifting something heavy.  No melena. Hematochezia, hematuria.   ROS:  Cardiovascular ROS: positive for - dyspnea on exertion, edema, shortness of breath, and leg cramps, mostly on the left upper leg.  Very tender and painful legs.  Left arm numbness that goes up from hand to arm. negative for - chest pain, irregular heartbeat, orthopnea, palpitations, paroxysmal nocturnal dyspnea, rapid heart rate, shortness of breath, or lightheadedness or dizziness, syncope or near syncope, TIA/fugax or claudication. Review of Systems - Negative except joint pains, left leg pain especially cramping and swelling swelling.  Energy level being down.  Is having some GI issues with pending colonoscopy.  Is not looking forward to this.    Objective   Studies Reviewed: .       R&L Heart Cath 04/10/2018: Normal coronary arteries LV end diastolic pressure is normal. (Echocardiogram showed normal EF) Normal Right Heart Cath Pressures with no evidence Pulmonary Hypertension Normal Cardiac Output/Index: 5.9-2.8 ECHO 07/05/2022: Normal LV size and function.  EF 60 to 65%.  No RWMA.  Mild LVH.  Normal diastolic parameters for age-but mild LA dilation noted.  Normal RV size and function.  Normal RAP but unable assess PAP.  Trivial MR with trivial holosystolic MVP of A2 leaflet.  Mild ao calcification-sclerosis but no stenosis.  (No change) Lab Results  Component Value Date   CHOL 192 03/22/2022   HDL 74.30 03/22/2022   LDLCALC 102 (H) 03/22/2022   LDLDIRECT 136.2 06/03/2011   TRIG 79.0 03/22/2022   CHOLHDL 3 03/22/2022    Risk  Assessment/Calculations:             Physical Exam:   VS:  BP 138/82 (BP Location: Left Arm, Patient Position: Sitting, Cuff Size: Large)   Pulse 70   Ht 5' 6.5" (1.689 m)   Wt 212 lb 9.6 oz (96.4 kg)   LMP  (LMP Unknown)   BMI 33.80 kg/m    Wt Readings from Last 3 Encounters:  09/20/22 212 lb 9.6 oz (96.4 kg)  06/24/22 226 lb (102.5 kg)  06/06/22 226 lb (102.5 kg)    GEN: Well nourished, well groomed in no acute distress; moderately obese NECK: No JVD; No carotid bruits CARDIAC: Normal S1, S2; RRR, no murmurs, rubs, gallops RESPIRATORY:  Clear to auscultation without rales, wheezing or rhonchi ; nonlabored, good air movement. ABDOMEN: Soft, non-tender, non-distended EXTREMITIES:  No edema; No deformity     ASSESSMENT AND PLAN: .    Problem List Items Addressed This Visit       Cardiology Problems   Hyperlipidemia with target LDL less than 100 (Chronic)    Remains on atorvastatin.  LDL is 102, low threshold to increase the dose.      Relevant Orders   EKG 12-Lead (Completed)     Other   Pedal edema    Unlikely CHF w/o PND/orthopnea-check BNP.Marland Kitchen  With associated fatigue, will also add TSH and CMP  Check BLE Venous Dopplers to exclude DVT/IVC Dopplers -> check D-dimer Will give PRN Rx for furosemide ~20 mg.       Relevant Orders   Brain natriuretic peptide   Comprehensive metabolic panel   D-dimer, quantitative   VAS [place] LOWER EXTREMITY VENOUS (DVT)   Diastolic dysfunction without heart failure (Chronic)    Echo again mentions some diastolic dysfunction but essentially normal for age.  Still has fatigue and dyspnea but now probably is as much of a component of her being obese than in the office.  She has gained more weight. She does have blood pressure room if necessary to put her back on an ARB.      Relevant Orders   EKG 12-Lead (Completed)   Brain natriuretic peptide   Comprehensive metabolic panel   TSH   D-dimer, quantitative   COPD (chronic obstructive  pulmonary disease) (HCC) - Primary (Chronic)    Chronic.  Not fully controlled.  Is on Trelegy and Breztri.  Echocardiogram 1 pretty well-controlled with no real evidence of pulmonary hypertension.  However, the RV's pressures are not adequately assessed.      Relevant Orders   EKG 12-Lead (Completed)   Brain natriuretic peptide   Comprehensive metabolic panel   TSH   D-dimer, quantitative            Dispo: Return in about 4 weeks (around 10/18/2022) for Alternate 6 month follow-up with APP & MD.  Total time spent: 25 min spent with patient + 12+15 min spent charting = 52 min    Signed,  Marykay Lex, MD, MS Bryan Lemma, M.D., M.S. Interventional Cardiologist  [facility]  Pager # [phone] Phone # [phone] [address] [place], [place] [address]

## 2022-09-20 ENCOUNTER — Ambulatory Visit: Payer: Medicare HMO | Attending: General Practice | Admitting: Cardiology

## 2022-09-20 ENCOUNTER — Encounter: Payer: Self-pay | Admitting: Cardiology

## 2022-09-20 VITALS — BP 138/82 | HR 70 | Ht 66.5 in | Wt 212.6 lb

## 2022-09-20 DIAGNOSIS — R600 Localized edema: Secondary | ICD-10-CM | POA: Diagnosis not present

## 2022-09-20 DIAGNOSIS — E785 Hyperlipidemia, unspecified: Secondary | ICD-10-CM

## 2022-09-20 DIAGNOSIS — J449 Chronic obstructive pulmonary disease, unspecified: Secondary | ICD-10-CM

## 2022-09-20 DIAGNOSIS — I5189 Other ill-defined heart diseases: Secondary | ICD-10-CM | POA: Diagnosis not present

## 2022-09-20 NOTE — Assessment & Plan Note (Signed)
Remains on atorvastatin.  LDL is 102, low threshold to increase the dose.

## 2022-09-20 NOTE — Assessment & Plan Note (Signed)
Echo again mentions some diastolic dysfunction but essentially normal for age.  Still has fatigue and dyspnea but now probably is as much of a component of her being obese than in the office.  She has gained more weight. She does have blood pressure room if necessary to put her back on an ARB.

## 2022-09-20 NOTE — Assessment & Plan Note (Signed)
Chronic.  Not fully controlled.  Is on Trelegy and Breztri.  Echocardiogram 1 pretty well-controlled with no real evidence of pulmonary hypertension.  However, the RV's pressures are not adequately assessed.

## 2022-09-20 NOTE — Assessment & Plan Note (Addendum)
Unlikely CHF w/o PND/orthopnea-check BNP.Marland Kitchen  With associated fatigue, will also add TSH and CMP  Check BLE Venous Dopplers to exclude DVT/IVC Dopplers -> check D-dimer Will give PRN Rx for furosemide ~20 mg.

## 2022-09-20 NOTE — Patient Instructions (Addendum)
Medication Instructions:  No changes   *If you need a refill on your cardiac medications before your next appointment, please call your pharmacy*   Lab Work: TSH BNP D-dimer CMP If you have labs (blood work) drawn today and your tests are completely normal, you will receive your results only by: MyChart Message (if you have MyChart) OR A paper copy in the mail If you have any lab test that is abnormal or we need to change your treatment, we will call you to review the results.   Testing/Procedures:   Will be schedule at [address] Your physician has requested that you have a lower extremity venous duplex for DVT. This test is an ultrasound of the veins in the legs. It looks at venous blood flow that carries blood from the heart to the legs. Allow one hour for a Lower Venous exam.  There are no restrictions or special instructions.    Follow-Up: At [facility], you and your health needs are our priority.  As part of our continuing mission to provide you with exceptional heart care, we have created designated Provider Care Teams.  These Care Teams include your primary Cardiologist (physician) and Advanced Practice Providers (APPs -  Physician Assistants and Nurse Practitioners) who all work together to provide you with the care you need, when you need it.     Your next appointment:    3 to 4 week(s)  The format for your next appointment:   In Person  Provider:   Bernadene Person, NP

## 2022-09-21 LAB — COMPREHENSIVE METABOLIC PANEL
ALT: 19 IU/L (ref 0–32)
AST: 21 IU/L (ref 0–40)
Albumin: 4.1 g/dL (ref 3.8–4.8)
Alkaline Phosphatase: 69 IU/L (ref 44–121)
BUN/Creatinine Ratio: 28 (ref 12–28)
BUN: 17 mg/dL (ref 8–27)
Bilirubin Total: 0.4 mg/dL (ref 0.0–1.2)
CO2: 25 mmol/L (ref 20–29)
Calcium: 9.1 mg/dL (ref 8.7–10.3)
Chloride: 104 mmol/L (ref 96–106)
Creatinine, Ser: 0.6 mg/dL (ref 0.57–1.00)
Globulin, Total: 2.3 g/dL (ref 1.5–4.5)
Glucose: 111 mg/dL — ABNORMAL HIGH (ref 70–99)
Potassium: 4.3 mmol/L (ref 3.5–5.2)
Sodium: 142 mmol/L (ref 134–144)
Total Protein: 6.4 g/dL (ref 6.0–8.5)
eGFR: 92 mL/min/{1.73_m2} (ref >59)

## 2022-09-21 LAB — TSH: TSH: 4.08 u[IU]/mL (ref 0.450–4.500)

## 2022-09-21 LAB — BRAIN NATRIURETIC PEPTIDE: BNP: 84.9 pg/mL (ref 0.0–100.0)

## 2022-09-21 LAB — D-DIMER, QUANTITATIVE: D-DIMER: 0.85 mg/L FEU — ABNORMAL HIGH (ref 0.00–0.49)

## 2022-09-27 DIAGNOSIS — H9202 Otalgia, left ear: Secondary | ICD-10-CM | POA: Diagnosis not present

## 2022-09-27 DIAGNOSIS — H6982 Other specified disorders of Eustachian tube, left ear: Secondary | ICD-10-CM | POA: Diagnosis not present

## 2022-09-28 ENCOUNTER — Ambulatory Visit (HOSPITAL_COMMUNITY)
Admission: RE | Admit: 2022-09-28 | Discharge: 2022-09-28 | Disposition: A | Discharge status: Home / Self Care | Payer: Medicare HMO | Source: Ambulatory Visit | Attending: Cardiology | Admitting: Cardiology

## 2022-09-28 DIAGNOSIS — R600 Localized edema: Secondary | ICD-10-CM | POA: Diagnosis not present

## 2022-10-10 DIAGNOSIS — H903 Sensorineural hearing loss, bilateral: Secondary | ICD-10-CM | POA: Diagnosis not present

## 2022-10-10 DIAGNOSIS — H6982 Other specified disorders of Eustachian tube, left ear: Secondary | ICD-10-CM | POA: Diagnosis not present

## 2022-10-11 ENCOUNTER — Ambulatory Visit: Payer: Medicare HMO | Admitting: Nurse Practitioner

## 2022-10-11 NOTE — Progress Notes (Deleted)
Office Visit    Patient Name: BENNETT KAUZLARICH Date of Encounter: 10/11/2022  Primary Care Provider:  Excell Seltzer, MD Primary Cardiologist:  Bryan Lemma, MD  Chief Complaint    77 year old female with a history of atypical chest pain, postoperative atrial fibrillation, mild mitral valve regurgitation, hyperlipidemia, severe COPD, rheumatoid arthritis, breast cancer, and squamous cell carcinoma of the right lung s/p right-sided VATS and bilobectomy who presents for follow-up related to bilateral pedal edema.   Past Medical History    Past Medical History:  Diagnosis Date   Breast cancer (HCC) 1999   Cataract 2017   resolved with surgery   COPD (chronic obstructive pulmonary disease) (HCC)    Coronary artery disease    Dysplastic nevus 09/04/2012   R prox calf - mild    GERD (gastroesophageal reflux disease)    Hyperlipidemia    Postoperative atrial fibrillation (HCC) 03/2018   Rheumatoid arthritis(714.0)    Squamous cell carcinoma of lung, stage I, right (HCC) 2020   Status post right upper and middle lobectomy   Past Surgical History:  Procedure Laterality Date   BREAST LUMPECTOMY  1999   left, needed radiation   CATARACT EXTRACTION W/ INTRAOCULAR LENS  IMPLANT, BILATERAL Bilateral 05/19/15, 06/02/15   CHEST TUBE INSERTION Right 04/23/2018   Procedure: Right Lateral Chest Tube Insertion;  Surgeon: Loreli Slot, MD;  Location: [facility] OR;  Service: Thoracic;  Laterality: Right;   CHOLECYSTECTOMY     ESOPHAGOGASTRODUODENOSCOPY (EGD) WITH PROPOFOL N/A 06/24/2022   Procedure: ESOPHAGOGASTRODUODENOSCOPY (EGD) WITH PROPOFOL;  Surgeon: Jeani Hawking, MD;  Location: [facility];  Service: Gastroenterology;  Laterality: N/A;   LOBECTOMY Right 04/23/2018   Procedure: RIGHT UPPER LOBECTOMY;  Surgeon: Loreli Slot, MD;  Location: [facility] OR;  Service: Thoracic;  Laterality: Right;   NM MYOVIEW LTD  01/2016   LOW RISK. Hypertensive response to exercise followed by significant  drop (stage I pressure 211/70 drop to 118/75. Stage II). Apical artifact but otherwise no ischemia or infarction. EF 64%.   NODE DISSECTION Right 04/23/2018   Procedure: MEDIASTINAL NODE DISSECTION;  Surgeon: Loreli Slot, MD;  Location: [facility] OR;  Service: Thoracic;  Laterality: Right;   RIGHT/LEFT HEART CATH AND CORONARY ANGIOGRAPHY N/A 04/10/2018   Procedure: RIGHT/LEFT HEART CATH AND CORONARY ANGIOGRAPHY;  Surgeon: Marykay Lex, MD;  Location: [facility];; angiographically minimal coronary artery disease.  Normal LVEDP.  Basely normal RHC pressures with no suggestion of pulmonary hypertension.  Normal CO-CI by Fick (5.9-2.8)   SAVORY DILATION N/A 06/24/2022   Procedure: SAVORY DILATION;  Surgeon: Jeani Hawking, MD;  Location: [facility];  Service: Gastroenterology;  Laterality: N/A;   TONSILLECTOMY     TRANSTHORACIC ECHOCARDIOGRAM  11/'17; 2/'20   a) Nov 2017: Mild LVH. EF 60-65%. GR 2-DD; b) Feb 2020: Normal LV size and function.  EF 60 to 65%.  No R WMA.  GR 1 DD.  Normal RV size and function.  Normal pressures.  No valvular disease.   VIDEO ASSISTED THORACOSCOPY (VATS)/WEDGE RESECTION Right 04/23/2018   Procedure: VIDEO ASSISTED THORACOSCOPY (VATS)/WEDGE RESECTION OF RIGHT UPPER LUNG;  Surgeon: Loreli Slot, MD;  Location: [facility] OR;  Service: Thoracic;  Laterality: Right;    Allergies  Allergies  Allergen Reactions   Adhesive [Tape] Rash    Paper tape causes rash   Neosporin [Neomycin-Bacitracin Zn-Polymyx] Itching   Penicillins Rash    Did it involve swelling of the face/tongue/throat, SOB, or low BP? No Did it involve  sudden or severe rash/hives, skin peeling, or any reaction on the inside of your mouth or nose? Yes Did you need to seek medical attention at a hospital or doctor's office? Yes When did it last happen? From childhood If all above answers are "NO", may proceed with cephalosporin use.      Labs/Other Studies Reviewed    The following studies  were reviewed today:  Cardiac Studies & Procedures   CARDIAC CATHETERIZATION  CARDIAC CATHETERIZATION 04/10/2018  Narrative Images from the original result were not included.   Normal coronary arteries  LV end diastolic pressure is normal. (Echocardiogram showed normal EF)  Normal right heart cath pressures with no evidence pulmonary pretension  Normal cardiac output/index  SUMMARY  Angiographically normal coronary arteries  Normal Right Heart Cath Pressures with no evidence of Pulmonary Hypertension  Normal Cardiac Output-Index: 5.9-2.8  --NON-ANGINAL CHEST PAIN AND DYSPNEA  RECOMMENDATIONS  Discharge home after bedrest.  Standard post cath follow-up  Suspect chest pain dyspnea related to pulmonary issue.  No impediment to now proceed with lung surgery.   Bryan Lemma, M.D., M.S. Interventional Cardiologist  Pager # [phone] Phone # [phone] [address] [place], [place] [address]  Findings Coronary Findings Diagnostic  Dominance: Right  Left Main Vessel was injected. Vessel is large. Vessel is angiographically normal.  Left Anterior Descending Vessel was injected. Vessel is normal in caliber. Vessel is angiographically normal.  Ramus Intermedius Vessel was injected. Vessel is small. Vessel is angiographically normal.  Left Circumflex Vessel was injected. Vessel is large. Vessel is angiographically normal.  Second Obtuse Marginal Branch Vessel was injected. Vessel is small in size. Vessel is angiographically normal.  Right Coronary Artery Vessel was injected. Vessel is normal in caliber. Vessel is angiographically normal.  Acute Marginal Branch Vessel was injected. Vessel is small in size. Vessel is angiographically normal.  Inferior Septal Vessel was injected. Vessel is small in size. Vessel is angiographically normal.  Right Posterior Atrioventricular Artery Vessel was injected. Vessel is small in size. Vessel is  angiographically normal.  Intervention  No interventions have been documented.   STRESS TESTS  MYOCARDIAL PERFUSION IMAGING 02/04/2016  Narrative  The left ventricular ejection fraction is normal (55-65%).  Nuclear stress EF: 64%.  There was no ST segment deviation noted during stress.  The patient had an initial hypertensive BP response to exercise in Stage 1 with BP at 211/42mmHg and then dropped to 118/4mmHg in Stage 2.  There is a small defect of mild severity present in the apex location. The defect is non-reversible. No ischemia noted.  This is a low risk study.   ECHOCARDIOGRAM  ECHOCARDIOGRAM COMPLETE 07/05/2022  Narrative ECHOCARDIOGRAM REPORT    Patient Name:   AIDYN GUNSALLUS Date of Exam: 07/05/2022 Medical Rec #:  [id number]        Height:       66.5 in Accession #:    [id number]       Weight:       226.0 lb Date of Birth:  [birth year]         BSA:          2.118 m Patient Age:    76 years         BP:           174/73 mmHg Patient Gender: F                HR:           65 bpm. Exam Location:  [address]  Procedure: 2D Echo, Cardiac Doppler, Color Doppler, 3D Echo and Strain Analysis  Indications:    Mitral Regurgitation I34.0  History:        Patient has prior history of Echocardiogram examinations, most recent 09/23/2019. Mitral Valve Prolapse.  Sonographer:    Thurman Coyer RDCS Referring Phys: [phone]  C   IMPRESSIONS   1. Left ventricular ejection fraction, by estimation, is 60 to 65%. The left ventricle has normal function. The left ventricle has no regional wall motion abnormalities. There is mild left ventricular hypertrophy. Left ventricular diastolic parameters were normal. The average left ventricular global longitudinal strain is -23.6 %. The global longitudinal strain is normal. 2. Right ventricular systolic function is normal. The right ventricular size is normal. Tricuspid regurgitation signal is inadequate for assessing PA  pressure. 3. Left atrial size was mildly dilated. 4. The mitral valve is abnormal. Trivial mitral valve regurgitation. No evidence of mitral stenosis. 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 5. The aortic valve is tricuspid. There is mild calcification of the aortic valve. There is mild thickening of the aortic valve. Aortic valve regurgitation is not visualized. Aortic valve sclerosis is present, with no evidence of aortic valve stenosis. 6. The inferior vena cava is normal in size with greater than 50% respiratory variability, suggesting right atrial pressure of 3 mmHg.  Comparison(s): No significant change from prior study.  Conclusion(s)/Recommendation(s): Otherwise normal echocardiogram, with minor abnormalities described in the report. Trivial prolapse of A2 scallop of mitral valve with trivial regurgitation.  FINDINGS Left Ventricle: Left ventricular ejection fraction, by estimation, is 60 to 65%. The left ventricle has normal function. The left ventricle has no regional wall motion abnormalities. The average left ventricular global longitudinal strain is -23.6 %. The global longitudinal strain is normal. The left ventricular internal cavity size was normal in size. There is mild left ventricular hypertrophy. Left ventricular diastolic parameters were normal.  Right Ventricle: The right ventricular size is normal. No increase in right ventricular wall thickness. Right ventricular systolic function is normal. Tricuspid regurgitation signal is inadequate for assessing PA pressure.  Left Atrium: Left atrial size was mildly dilated.  Right Atrium: Right atrial size was normal in size.  Pericardium: There is no evidence of pericardial effusion.  Mitral Valve: The mitral valve is abnormal. 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 Mild mitral annular calcification.  Trivial mitral valve regurgitation. No evidence of mitral valve stenosis.  Tricuspid Valve: The tricuspid valve is normal in structure. Tricuspid valve regurgitation is trivial. No evidence of tricuspid stenosis.  Aortic Valve: The aortic valve is tricuspid. There is mild calcification of the aortic valve. There is mild thickening of the aortic valve. Aortic valve regurgitation is not visualized. Aortic valve sclerosis is present, with no evidence of aortic valve stenosis.  Pulmonic Valve: The pulmonic valve was not well visualized. Pulmonic valve regurgitation is not visualized. No evidence of pulmonic stenosis.  Aorta: The aortic root, ascending aorta, aortic arch and descending aorta are all structurally normal, with no evidence of dilitation or obstruction.  Venous: The inferior vena cava is normal in size with greater than 50% respiratory variability, suggesting right atrial pressure of 3 mmHg.  IAS/Shunts: The atrial septum is grossly normal.   LEFT VENTRICLE PLAX 2D LVIDd:         4.80 cm   Diastology LVIDs:  3.10 cm   LV e' medial:    6.41 cm/s LV PW:         1.40 cm   LV E/e' medial:  14.3 LV IVS:        1.40 cm   LV e' lateral:   8.11 cm/s LVOT diam:     2.00 cm   LV E/e' lateral: 11.3 LV SV:         66 LV SV Index:   31        2D Longitudinal Strain LVOT Area:     3.14 cm  2D Strain GLS (A2C):   -22.4 % 2D Strain GLS (A3C):   -26.6 % 2D Strain GLS (A4C):   -21.8 % 2D Strain GLS Avg:     -23.6 %  3D Volume EF: 3D EF:        60 % LV EDV:       120 ml LV ESV:       48 ml LV SV:        72 ml  RIGHT VENTRICLE RV Basal diam:  3.80 cm RV Mid diam:    3.15 cm RV S prime:     10.50 cm/s TAPSE (M-mode): 2.5 cm  LEFT ATRIUM             Index        RIGHT ATRIUM           Index LA diam:        4.20 cm 1.98 cm/m   RA Area:     15.10 cm LA Vol (A2C):   57.4 ml 27.10 ml/m  RA Volume:   37.80 ml  17.84 ml/m LA Vol (A4C):   63.5 ml 29.98 ml/m LA Biplane Vol: 62.2  ml 29.36 ml/m AORTIC VALVE LVOT Vmax:   91.20 cm/s LVOT Vmean:  61.200 cm/s LVOT VTI:    0.211 m  AORTA Ao Root diam: 2.80 cm  MITRAL VALVE MV Area (PHT): 3.99 cm    SHUNTS MV Decel Time: 190 msec    Systemic VTI:  0.21 m MV E velocity: 91.80 cm/s  Systemic Diam: 2.00 cm MV A velocity: 88.10 cm/s MV E/A ratio:  1.04  Jodelle Red MD Electronically signed by Jodelle Red MD Signature Date/Time: 07/05/2022/3:37:29 PM    Final            Recent Labs: 10/18/2021: Hemoglobin 14.3; Platelet Count 296 09/20/2022: ALT 19; BNP 84.9; BUN 17; Creatinine, Ser 0.60; Potassium 4.3; Sodium 142; TSH 4.080  Recent Lipid Panel    Component Value Date/Time   CHOL 192 03/22/2022 1033   TRIG 79.0 03/22/2022 1033   HDL 74.30 03/22/2022 1033   CHOLHDL 3 03/22/2022 1033   VLDL 15.8 03/22/2022 1033   LDLCALC 102 (H) 03/22/2022 1033   LDLDIRECT 136.2 06/03/2011 0836    History of Present Illness    77 year old female with the above past medical history including atypical chest pain, postoperative atrial fibrillation, mild mitral valve regurgitation, hyperlipidemia, severe COPD, rheumatoid arthritis, breast cancer, and squamous cell carcinoma of the right lung s/p right-sided VATS and bilobectomy.   Cardiac catheterization in 2020 showed normal coronary arteries, normal right heart pressures with no evidence of pulmonary hypertension.  Lower extremity venous Dopplers in 08/2019 in the setting of lower extremity edema showed no evidence of DVT.  Echocardiogram at that time showed EF 70 to 75%, hyperdynamic LV, no RWMA, normal RV function, mild MR.  ABIs 2022 in the setting of bilateral lower  extremity edema, leg pain, were normal.  Echocardiogram in 06/2022 in the setting of preoperative cardiac evaluation showed EF 60 to 65%, normal LV function, no RWMA, mild LVH, normal RV, no significant valvular abnormalities, no significant change from prior study.  She was last seen in the office  on 09/20/2022 and noted increased pedal edema mildly increased dyspnea on exertion, and generalized fatigue.  Dimer was mildly elevated.  Lower extremity venous duplex was negative for DVT.  BNP was normal.  She was started on Lasix 20 mg daily.  TSH was normal.  She presents today for follow-up. Since her last visit she has  1. Atypical chest pain: Cath in 2020 showed normal coronary arteries, normal right heart pressures with no evidence of pulmonary hypertension.  She has stable chronic dyspnea on exertion, occasional fleeting sharp chest pains that last for seconds, not associated with exertion, unchanged from prior visits. No indication for ischemic evaluation.     2. Postoperative atrial fibrillation: No documented recurrence. No longer on anticoagulation. Denies palpitations.    3. Leg pain/lower extremity edema: Echocardiogram at that time showed EF 70 to 75%, hyperdynamic LV, no RWMA, normal RV function, mild MR. ABIs in 2022 were normal.  She still notes occasional leg pain, denies lower extremity edema. Symptoms unchanged from piror visits.    4. Mitral valve regurgitation: Mild on most recent echo in 2021.  Will repeat echo for routine monitoring.   5. Hyperlipidemia: LDL was 102 in 02/2022.  Continue Lipitor.   6. COPD/history of lung cancer: S/p right-sided VATS and bilobectomy. Stable chronic dyspnea. Follows with oncology/pulmonology.     7. Disposition: Follow-up in Home Medications    Current Outpatient Medications  Medication Sig Dispense Refill   atorvastatin (LIPITOR) 10 MG tablet Take 1 tablet (10 mg total) by mouth daily. 100 tablet 1   Budeson-Glycopyrrol-Formoterol (BREZTRI AEROSPHERE) 160-9-4.8 MCG/ACT AERO Inhale 2 puffs into the lungs daily as needed. 10.7 g 11   Cholecalciferol (VITAMIN D3) 1.25 MG (50000 UT) CAPS Take 1 capsule by mouth once a week. 12 capsule 3   Fluticasone-Umeclidin-Vilant (TRELEGY ELLIPTA) 100-62.5-25 MCG/ACT AEPB Inhale 1 puff into the lungs  daily. 1 each 11   Ivermectin 1 % CREA Apply 1 Application topically as directed. Apply to face qd to bid  for Rosacea 30 g 11   minoxidil (LONITEN) 2.5 MG tablet Take 1 tablet (2.5 mg total) by mouth daily. 90 tablet 1   omeprazole (PRILOSEC) 40 MG capsule Take 40 mg by mouth daily.     No current facility-administered medications for this visit.     Review of Systems    ***.  All other systems reviewed and are otherwise negative except as noted above.    Physical Exam    VS:  LMP  (LMP Unknown)  , BMI There is no height or weight on file to calculate BMI.     GEN: Well nourished, well developed, in no acute distress. HEENT: normal. Neck: Supple, no JVD, carotid bruits, or masses. Cardiac: RRR, no murmurs, rubs, or gallops. No clubbing, cyanosis, edema.  Radials/DP/PT 2+ and equal bilaterally.  Respiratory:  Respirations regular and unlabored, clear to auscultation bilaterally. GI: Soft, nontender, nondistended, BS + x 4. MS: no deformity or atrophy. Skin: warm and dry, no rash. Neuro:  Strength and sensation are intact. Psych: Normal affect.  Accessory Clinical Findings    ECG personally reviewed by me today -    - no acute changes.   Lab Results  Component Value Date   WBC 7.4 10/18/2021   HGB 14.3 10/18/2021   HCT 41.9 10/18/2021   MCV 91.3 10/18/2021   PLT 296 10/18/2021   Lab Results  Component Value Date   CREATININE 0.60 09/20/2022   BUN 17 09/20/2022   NA 142 09/20/2022   K 4.3 09/20/2022   CL 104 09/20/2022   CO2 25 09/20/2022   Lab Results  Component Value Date   ALT 19 09/20/2022   AST 21 09/20/2022   ALKPHOS 69 09/20/2022   BILITOT 0.4 09/20/2022   Lab Results  Component Value Date   CHOL 192 03/22/2022   HDL 74.30 03/22/2022   LDLCALC 102 (H) 03/22/2022   LDLDIRECT 136.2 06/03/2011   TRIG 79.0 03/22/2022   CHOLHDL 3 03/22/2022    Lab Results  Component Value Date   HGBA1C 5.9 03/22/2022    Assessment & Plan    1.  ***  No BP  recorded.  {Refresh Note OR Click here to enter BP  :1}***   Joylene Grapes, NP 10/11/2022, 5:55 AM

## 2022-10-17 ENCOUNTER — Other Ambulatory Visit: Payer: Self-pay

## 2022-10-17 ENCOUNTER — Encounter (HOSPITAL_COMMUNITY): Payer: Self-pay

## 2022-10-17 ENCOUNTER — Ambulatory Visit (HOSPITAL_COMMUNITY)
Admission: RE | Admit: 2022-10-17 | Discharge: 2022-10-17 | Disposition: A | Discharge status: Home / Self Care | Payer: Medicare HMO | Source: Ambulatory Visit | Attending: Internal Medicine | Admitting: Internal Medicine

## 2022-10-17 ENCOUNTER — Inpatient Hospital Stay: Payer: Medicare HMO | Attending: Internal Medicine

## 2022-10-17 DIAGNOSIS — C3490 Malignant neoplasm of unspecified part of unspecified bronchus or lung: Secondary | ICD-10-CM | POA: Diagnosis not present

## 2022-10-17 DIAGNOSIS — Z853 Personal history of malignant neoplasm of breast: Secondary | ICD-10-CM | POA: Insufficient documentation

## 2022-10-17 DIAGNOSIS — Z902 Acquired absence of lung [part of]: Secondary | ICD-10-CM | POA: Insufficient documentation

## 2022-10-17 DIAGNOSIS — Z85118 Personal history of other malignant neoplasm of bronchus and lung: Secondary | ICD-10-CM | POA: Insufficient documentation

## 2022-10-17 DIAGNOSIS — I10 Essential (primary) hypertension: Secondary | ICD-10-CM | POA: Insufficient documentation

## 2022-10-17 DIAGNOSIS — J432 Centrilobular emphysema: Secondary | ICD-10-CM | POA: Diagnosis not present

## 2022-10-17 LAB — CMP (CANCER CENTER ONLY)
ALT: 21 U/L (ref 0–44)
AST: 19 U/L (ref 15–41)
Albumin: 3.9 g/dL (ref 3.5–5.0)
Alkaline Phosphatase: 75 U/L (ref 38–126)
Anion gap: 6 (ref 5–15)
BUN: 20 mg/dL (ref 8–23)
CO2: 29 mmol/L (ref 22–32)
Calcium: 9 mg/dL (ref 8.9–10.3)
Chloride: 105 mmol/L (ref 98–111)
Creatinine: 0.69 mg/dL (ref 0.44–1.00)
GFR, Estimated: >60 mL/min (ref >60)
Glucose, Bld: 117 mg/dL — ABNORMAL HIGH (ref 70–99)
Potassium: 3.7 mmol/L (ref 3.5–5.1)
Sodium: 140 mmol/L (ref 135–145)
Total Bilirubin: 0.6 mg/dL (ref 0.3–1.2)
Total Protein: 7 g/dL (ref 6.5–8.1)

## 2022-10-17 LAB — CBC WITH DIFFERENTIAL (CANCER CENTER ONLY)
Abs Immature Granulocytes: 0.03 10*3/uL (ref 0.00–0.07)
Basophils Absolute: 0.1 10*3/uL (ref 0.0–0.1)
Basophils Relative: 1 %
Eosinophils Absolute: 0.1 10*3/uL (ref 0.0–0.5)
Eosinophils Relative: 1 %
HCT: 41.9 % (ref 36.0–46.0)
Hemoglobin: 14.6 g/dL (ref 12.0–15.0)
Immature Granulocytes: 0 %
Lymphocytes Relative: 32 %
Lymphs Abs: 2.6 10*3/uL (ref 0.7–4.0)
MCH: 32.2 pg (ref 26.0–34.0)
MCHC: 34.8 g/dL (ref 30.0–36.0)
MCV: 92.5 fL (ref 80.0–100.0)
Monocytes Absolute: 0.6 10*3/uL (ref 0.1–1.0)
Monocytes Relative: 7 %
Neutro Abs: 4.9 10*3/uL (ref 1.7–7.7)
Neutrophils Relative %: 59 %
Platelet Count: 293 10*3/uL (ref 150–400)
RBC: 4.53 MIL/uL (ref 3.87–5.11)
RDW: 13 % (ref 11.5–15.5)
WBC Count: 8.3 10*3/uL (ref 4.0–10.5)
nRBC: 0 % (ref 0.0–0.2)

## 2022-10-17 MED ORDER — SODIUM CHLORIDE (PF) 0.9 % IJ SOLN
INTRAMUSCULAR | Status: AC
  Filled 2022-10-17: qty 50

## 2022-10-17 MED ORDER — IOHEXOL 300 MG/ML  SOLN
75.0000 mL | Freq: Once | INTRAMUSCULAR | Status: AC | PRN
  Administered 2022-10-17: 75 mL via INTRAVENOUS

## 2022-10-19 ENCOUNTER — Inpatient Hospital Stay: Payer: Medicare HMO | Admitting: Internal Medicine

## 2022-10-19 VITALS — BP 150/68 | HR 75 | Temp 98.1°F | Resp 17 | Ht 66.5 in | Wt 230.0 lb

## 2022-10-19 DIAGNOSIS — Z85118 Personal history of other malignant neoplasm of bronchus and lung: Secondary | ICD-10-CM | POA: Diagnosis not present

## 2022-10-19 DIAGNOSIS — C3490 Malignant neoplasm of unspecified part of unspecified bronchus or lung: Secondary | ICD-10-CM

## 2022-10-19 DIAGNOSIS — Z902 Acquired absence of lung [part of]: Secondary | ICD-10-CM | POA: Diagnosis not present

## 2022-10-19 DIAGNOSIS — I10 Essential (primary) hypertension: Secondary | ICD-10-CM | POA: Diagnosis not present

## 2022-10-19 DIAGNOSIS — Z853 Personal history of malignant neoplasm of breast: Secondary | ICD-10-CM | POA: Diagnosis not present

## 2022-10-19 NOTE — Progress Notes (Signed)
[facility] [phone]   [phone]  OFFICE PROGRESS NOTE  Excell Seltzer, MD [address] [place] [address]  DIAGNOSIS: Stage IB (T2a, N0, M0) non-small cell lung cancer, squamous cell carcinoma presented with right upper lobe nodule  PRIOR THERAPY:  Status post right upper and right middle lobe bilobectomy as well as lymph node dissections under the care of Dr. Dorris Fetch on April 23, 2018.  The tumor was 1.7 cm in size but there was evidence for visceropleural involvement.  CURRENT THERAPY: Observation.  INTERVAL HISTORY: Hannah Green 77 y.o. female returns to the clinic today for follow-up visit.  The patient is feeling fine today with no concerning complaints except for occasional shortness of breath with exertion.  She denied having any current chest pain, cough or hemoptysis.  She has no recent weight loss or night sweats.  She has no nausea, vomiting, diarrhea or constipation.  She has no headache or visual changes.  She had repeat CT scan of the chest performed recently and she is here for evaluation and discussion of her scan results.  MEDICAL HISTORY: Past Medical History:  Diagnosis Date   Breast cancer [facility]) 1999   Cataract 2017   resolved with surgery   COPD (chronic obstructive pulmonary disease) (HCC)    Coronary artery disease    Dysplastic nevus 09/04/2012   R prox calf - mild    GERD (gastroesophageal reflux disease)    Hyperlipidemia    Postoperative atrial fibrillation (HCC) 03/2018   Rheumatoid arthritis(714.0)    Squamous cell carcinoma of lung, stage I, right (HCC) 2020   Status post right upper and middle lobectomy    ALLERGIES:  is allergic to adhesive [tape], neosporin [neomycin-bacitracin zn-polymyx], and penicillins.  MEDICATIONS:  Current Outpatient Medications  Medication Sig Dispense Refill   atorvastatin (LIPITOR) 10 MG tablet Take 1 tablet (10 mg total) by mouth daily. 100 tablet  1   Budeson-Glycopyrrol-Formoterol (BREZTRI AEROSPHERE) 160-9-4.8 MCG/ACT AERO Inhale 2 puffs into the lungs daily as needed. 10.7 g 11   Cholecalciferol (VITAMIN D3) 1.25 MG (50000 UT) CAPS Take 1 capsule by mouth once a week. 12 capsule 3   Fluticasone-Umeclidin-Vilant (TRELEGY ELLIPTA) 100-62.5-25 MCG/ACT AEPB Inhale 1 puff into the lungs daily. 1 each 11   Ivermectin 1 % CREA Apply 1 Application topically as directed. Apply to face qd to bid  for Rosacea 30 g 11   minoxidil (LONITEN) 2.5 MG tablet Take 1 tablet (2.5 mg total) by mouth daily. 90 tablet 1   omeprazole (PRILOSEC) 40 MG capsule Take 40 mg by mouth daily.     No current facility-administered medications for this visit.    SURGICAL HISTORY:  Past Surgical History:  Procedure Laterality Date   BREAST LUMPECTOMY  1999   left, needed radiation   CATARACT EXTRACTION W/ INTRAOCULAR LENS  IMPLANT, BILATERAL Bilateral 05/19/15, 06/02/15   CHEST TUBE INSERTION Right 04/23/2018   Procedure: Right Lateral Chest Tube Insertion;  Surgeon: Loreli Slot, MD;  Location: [facility] OR;  Service: Thoracic;  Laterality: Right;   CHOLECYSTECTOMY     ESOPHAGOGASTRODUODENOSCOPY (EGD) WITH PROPOFOL N/A 06/24/2022   Procedure: ESOPHAGOGASTRODUODENOSCOPY (EGD) WITH PROPOFOL;  Surgeon: Jeani Hawking, MD;  Location: [facility];  Service: Gastroenterology;  Laterality: N/A;   LOBECTOMY Right 04/23/2018   Procedure: RIGHT UPPER LOBECTOMY;  Surgeon: Loreli Slot, MD;  Location: [facility] OR;  Service: Thoracic;  Laterality: Right;   NM MYOVIEW LTD  01/2016   LOW RISK. Hypertensive response to exercise followed by significant drop (stage I pressure 211/70 drop to 118/75. Stage II). Apical artifact but otherwise no ischemia or infarction. EF 64%.   NODE DISSECTION Right 04/23/2018   Procedure: MEDIASTINAL NODE DISSECTION;  Surgeon: Loreli Slot, MD;  Location: [facility] OR;  Service: Thoracic;  Laterality: Right;   RIGHT/LEFT HEART CATH AND CORONARY  ANGIOGRAPHY N/A 04/10/2018   Procedure: RIGHT/LEFT HEART CATH AND CORONARY ANGIOGRAPHY;  Surgeon: Marykay Lex, MD;  Location: [facility];; angiographically minimal coronary artery disease.  Normal LVEDP.  Basely normal RHC pressures with no suggestion of pulmonary hypertension.  Normal CO-CI by Fick (5.9-2.8)   SAVORY DILATION N/A 06/24/2022   Procedure: SAVORY DILATION;  Surgeon: Jeani Hawking, MD;  Location: [facility];  Service: Gastroenterology;  Laterality: N/A;   TONSILLECTOMY     TRANSTHORACIC ECHOCARDIOGRAM  11/'17; 2/'20   a) Nov 2017: Mild LVH. EF 60-65%. GR 2-DD; b) Feb 2020: Normal LV size and function.  EF 60 to 65%.  No R WMA.  GR 1 DD.  Normal RV size and function.  Normal pressures.  No valvular disease.   VIDEO ASSISTED THORACOSCOPY (VATS)/WEDGE RESECTION Right 04/23/2018   Procedure: VIDEO ASSISTED THORACOSCOPY (VATS)/WEDGE RESECTION OF RIGHT UPPER LUNG;  Surgeon: Loreli Slot, MD;  Location: [facility] OR;  Service: Thoracic;  Laterality: Right;    REVIEW OF SYSTEMS:  A comprehensive review of systems was negative except for: Respiratory: positive for dyspnea on exertion   PHYSICAL EXAMINATION: General appearance: alert, cooperative, and no distress Head: Normocephalic, without obvious abnormality, atraumatic Neck: no adenopathy, no JVD, supple, symmetrical, trachea midline, and thyroid not enlarged, symmetric, no tenderness/mass/nodules Lymph nodes: Cervical, supraclavicular, and axillary nodes normal. Resp: clear to auscultation bilaterally Back: symmetric, no curvature. ROM normal. No CVA tenderness. Cardio: regular rate and rhythm, S1, S2 normal, no murmur, click, rub or gallop GI: soft, non-tender; bowel sounds normal; no masses,  no organomegaly Extremities: extremities normal, atraumatic, no cyanosis or edema  ECOG PERFORMANCE STATUS: 1 - Symptomatic but completely ambulatory  Blood pressure (!) 150/68, pulse 75, temperature 98.1 F (36.7 C), resp.  rate 17, height 5' 6.5" (1.689 m), weight 230 lb (104.3 kg), SpO2 97%.  LABORATORY DATA: Lab Results  Component Value Date   WBC 8.3 10/17/2022   HGB 14.6 10/17/2022   HCT 41.9 10/17/2022   MCV 92.5 10/17/2022   PLT 293 10/17/2022      Chemistry      Component Value Date/Time   NA 140 10/17/2022 0942   NA 142 09/20/2022 1000   K 3.7 10/17/2022 0942   CL 105 10/17/2022 0942   CO2 29 10/17/2022 0942   BUN 20 10/17/2022 0942   BUN 17 09/20/2022 1000   CREATININE 0.69 10/17/2022 0942   CREATININE 0.61 06/01/2012 1452      Component Value Date/Time   CALCIUM 9.0 10/17/2022 0942   ALKPHOS 75 10/17/2022 0942   AST 19 10/17/2022 0942   ALT 21 10/17/2022 0942   BILITOT 0.6 10/17/2022 0942       RADIOGRAPHIC STUDIES: VAS [place] LOWER EXTREMITY VENOUS (DVT)  Result Date: 10/02/2022  Lower Venous DVT Study Patient Name:  Hannah Green  Date of Exam:   09/28/2022 Medical Rec #: [id number]         Accession #:    [id number] Date of Birth: [birth year]          Patient Gender: F Patient Age:   36  years Exam Location:  [facility] Procedure:      VAS [place] LOWER EXTREMITY VENOUS (DVT) Referring Phys: DAVID HARDING --------------------------------------------------------------------------------  Indications: Patient complains of increased SOB with intermittent chest pains for about 4 weeks. She did have bilateral pedal edema which has since resolved after taking diuretics.  Limitations: Patient has COPD. Comparison Study: 09/25/2019 right leg venous duplex showed no DVT. Performing Technologist: Carlos American RVT, RDCS (AE), RDMS  Examination Guidelines: A complete evaluation includes B-mode imaging, spectral Doppler, color Doppler, and power Doppler as needed of all accessible portions of each vessel. Bilateral testing is considered an integral part of a complete examination. Limited examinations for reoccurring indications may be performed as noted. The reflux portion of the exam is performed with the  patient in reverse Trendelenburg.  +---------+---------------+---------+-----------+----------+--------------+ RIGHT    CompressibilityPhasicitySpontaneityPropertiesThrombus Aging +---------+---------------+---------+-----------+----------+--------------+ CFV      Full           Yes      Yes                                 +---------+---------------+---------+-----------+----------+--------------+ SFJ      Full           Yes      Yes                                 +---------+---------------+---------+-----------+----------+--------------+ FV Prox  Full           Yes      Yes                                 +---------+---------------+---------+-----------+----------+--------------+ FV Mid   Full           Yes      Yes                                 +---------+---------------+---------+-----------+----------+--------------+ FV DistalFull           Yes      Yes                                 +---------+---------------+---------+-----------+----------+--------------+ PFV      Full                                                        +---------+---------------+---------+-----------+----------+--------------+ POP      Full           Yes      Yes                                 +---------+---------------+---------+-----------+----------+--------------+ PTV      Full           Yes      Yes                                 +---------+---------------+---------+-----------+----------+--------------+ PERO  Full           Yes      Yes                                 +---------+---------------+---------+-----------+----------+--------------+ Gastroc  Full                                                        +---------+---------------+---------+-----------+----------+--------------+ GSV      Full           Yes      Yes                                 +---------+---------------+---------+-----------+----------+--------------+    +---------+---------------+---------+-----------+----------+--------------+ LEFT     CompressibilityPhasicitySpontaneityPropertiesThrombus Aging +---------+---------------+---------+-----------+----------+--------------+ CFV      Full           Yes      Yes                                 +---------+---------------+---------+-----------+----------+--------------+ SFJ      Full           Yes      Yes                                 +---------+---------------+---------+-----------+----------+--------------+ FV Prox  Full           Yes      Yes                                 +---------+---------------+---------+-----------+----------+--------------+ FV Mid   Full           Yes      Yes                                 +---------+---------------+---------+-----------+----------+--------------+ FV DistalFull           Yes      Yes                                 +---------+---------------+---------+-----------+----------+--------------+ PFV      Full                                                        +---------+---------------+---------+-----------+----------+--------------+ POP      Full           Yes      Yes                                 +---------+---------------+---------+-----------+----------+--------------+ PTV      Full           Yes      Yes                                 +---------+---------------+---------+-----------+----------+--------------+  PERO     Full           Yes      Yes                                 +---------+---------------+---------+-----------+----------+--------------+ Gastroc  Full                                                        +---------+---------------+---------+-----------+----------+--------------+ GSV      Full           Yes      Yes                                 +---------+---------------+---------+-----------+----------+--------------+    Findings reported to Dr. Herbie Baltimore thru EPIC  email at 8:00 am.  Summary: BILATERAL: - No evidence of deep vein thrombosis seen in the lower extremities, bilaterally. Bilateral iliac veins and IVC appear patent with respiratory variations noted throughout. -  LEFT: - 2.9 x .8 x 1.2 cm complex appearing avascular Baker's cyst  *See table(s) above for measurements and observations. Electronically signed by Heath Lark on 10/02/2022 at 5:09:19 PM.    Final      ASSESSMENT AND PLAN: This is a very pleasant 77 years old white female with a stage Ib non-small cell lung cancer status post right upper and middle lobectomies with lymph node dissection in February 2020.   She has been in observation since that time and the patient is feeling fine with no concerning complaints except for the baseline shortness of breath increased with exertion. He had repeat CT scan of the chest performed few days ago and unfortunately the final report is still pending but I personally and independently reviewed the scan images and I do not see any concerning findings for disease recurrence or metastasis but I will wait for the final report for confirmation. If the final report showed no concerning findings for disease recurrence or metastasis, I will see her back for follow-up visit in 1 year for evaluation with repeat CT scan of the chest. For the hypertension she was advised to take her blood pressure medication as prescribed and to discuss with her primary care physician for adjustment of her medication if needed. The patient was advised to call immediately if she has any concerning symptoms in the interval. All questions were answered. The patient knows to call the clinic with any problems, questions or concerns. We can certainly see the patient much sooner if necessary.   Disclaimer: This note was dictated with voice recognition software. Similar sounding words can inadvertently be transcribed and may not be corrected upon review.

## 2022-10-20 ENCOUNTER — Other Ambulatory Visit: Payer: Self-pay | Admitting: Gastroenterology

## 2022-10-25 ENCOUNTER — Ambulatory Visit: Payer: Medicare HMO | Admitting: Dermatology

## 2022-11-07 DIAGNOSIS — H6982 Other specified disorders of Eustachian tube, left ear: Secondary | ICD-10-CM | POA: Diagnosis not present

## 2022-11-25 ENCOUNTER — Encounter (HOSPITAL_COMMUNITY): Payer: Self-pay | Admitting: Gastroenterology

## 2022-11-25 ENCOUNTER — Ambulatory Visit: Payer: Medicare HMO | Admitting: Family Medicine

## 2022-11-25 NOTE — Progress Notes (Signed)
[facility]   Conchita Paris MD Cardiologist-David Herbie Baltimore MD  EKG-09/20/22 Echo-07/05/22 Cath-04/10/18 Stress-2017 ICD/PM-n/a Blood thinner-n/a GLP-1-n/a  Hx:CAD,COPD,BRCA and lung cancer, post op afib, VATS. Last office visit with cardiology was 09/20/22 with a 4 month f/u. Patient endorses no cardiac issues, slight SOB but she said shes been like that since lung cancer and its normal for her. Does ok getting around no equipment or 02 use.   Anesthesia Review: Yes

## 2022-12-02 ENCOUNTER — Ambulatory Visit (HOSPITAL_COMMUNITY): Payer: Medicare HMO | Admitting: Certified Registered Nurse Anesthetist

## 2022-12-02 ENCOUNTER — Ambulatory Visit (HOSPITAL_COMMUNITY)
Admission: RE | Admit: 2022-12-02 | Discharge: 2022-12-02 | Disposition: A | Discharge status: Home / Self Care | Payer: Medicare HMO | Attending: Gastroenterology | Admitting: Gastroenterology

## 2022-12-02 ENCOUNTER — Other Ambulatory Visit: Payer: Self-pay

## 2022-12-02 ENCOUNTER — Encounter (HOSPITAL_COMMUNITY)
Admission: RE | Disposition: A | Discharge status: Home / Self Care | Payer: Self-pay | Source: Home / Self Care | Attending: Gastroenterology

## 2022-12-02 ENCOUNTER — Encounter (HOSPITAL_COMMUNITY): Payer: Self-pay | Admitting: Gastroenterology

## 2022-12-02 DIAGNOSIS — K219 Gastro-esophageal reflux disease without esophagitis: Secondary | ICD-10-CM | POA: Diagnosis not present

## 2022-12-02 DIAGNOSIS — I4891 Unspecified atrial fibrillation: Secondary | ICD-10-CM | POA: Diagnosis not present

## 2022-12-02 DIAGNOSIS — K5730 Diverticulosis of large intestine without perforation or abscess without bleeding: Secondary | ICD-10-CM | POA: Diagnosis not present

## 2022-12-02 DIAGNOSIS — Z800 Family history of malignant neoplasm of digestive organs: Secondary | ICD-10-CM | POA: Diagnosis not present

## 2022-12-02 DIAGNOSIS — Z1211 Encounter for screening for malignant neoplasm of colon: Secondary | ICD-10-CM | POA: Diagnosis not present

## 2022-12-02 DIAGNOSIS — M1990 Unspecified osteoarthritis, unspecified site: Secondary | ICD-10-CM | POA: Diagnosis not present

## 2022-12-02 DIAGNOSIS — I2510 Atherosclerotic heart disease of native coronary artery without angina pectoris: Secondary | ICD-10-CM | POA: Insufficient documentation

## 2022-12-02 DIAGNOSIS — Z860100 Personal history of colon polyps, unspecified: Secondary | ICD-10-CM

## 2022-12-02 DIAGNOSIS — J449 Chronic obstructive pulmonary disease, unspecified: Secondary | ICD-10-CM | POA: Insufficient documentation

## 2022-12-02 DIAGNOSIS — Z87891 Personal history of nicotine dependence: Secondary | ICD-10-CM | POA: Diagnosis not present

## 2022-12-02 DIAGNOSIS — D123 Benign neoplasm of transverse colon: Secondary | ICD-10-CM | POA: Insufficient documentation

## 2022-12-02 DIAGNOSIS — K5520 Angiodysplasia of colon without hemorrhage: Secondary | ICD-10-CM | POA: Diagnosis not present

## 2022-12-02 DIAGNOSIS — I10 Essential (primary) hypertension: Secondary | ICD-10-CM | POA: Diagnosis not present

## 2022-12-02 DIAGNOSIS — M069 Rheumatoid arthritis, unspecified: Secondary | ICD-10-CM | POA: Diagnosis not present

## 2022-12-02 DIAGNOSIS — K635 Polyp of colon: Secondary | ICD-10-CM | POA: Diagnosis not present

## 2022-12-02 HISTORY — PX: POLYPECTOMY: SHX5525

## 2022-12-02 HISTORY — PX: COLONOSCOPY WITH PROPOFOL: SHX5780

## 2022-12-02 SURGERY — COLONOSCOPY WITH PROPOFOL
Anesthesia: Monitor Anesthesia Care

## 2022-12-02 MED ORDER — DEXMEDETOMIDINE HCL IN NACL 80 MCG/20ML IV SOLN
INTRAVENOUS | Status: DC | PRN
Start: 2022-12-02 — End: 2022-12-02
  Administered 2022-12-02: 8 ug via INTRAVENOUS

## 2022-12-02 MED ORDER — FENTANYL CITRATE (PF) 100 MCG/2ML IJ SOLN
INTRAMUSCULAR | Status: AC
  Filled 2022-12-02: qty 2

## 2022-12-02 MED ORDER — FENTANYL CITRATE (PF) 100 MCG/2ML IJ SOLN
INTRAMUSCULAR | Status: DC | PRN
  Administered 2022-12-02: 50 ug via INTRAVENOUS

## 2022-12-02 MED ORDER — LACTATED RINGERS IV SOLN
INTRAVENOUS | Status: AC | PRN
  Administered 2022-12-02: 20 mL/h via INTRAVENOUS

## 2022-12-02 MED ORDER — PROPOFOL 500 MG/50ML IV EMUL
INTRAVENOUS | Status: DC | PRN
  Administered 2022-12-02: 30 mg via INTRAVENOUS
  Administered 2022-12-02: 50 mg via INTRAVENOUS
  Administered 2022-12-02: 75 ug/kg/min via INTRAVENOUS

## 2022-12-02 SURGICAL SUPPLY — 22 items
ELECT REM PT RETURN 9FT ADLT (ELECTROSURGICAL)
ELECTRODE REM PT RTRN 9FT ADLT (ELECTROSURGICAL) IMPLANT
FCP BXJMBJMB 240X2.8X (CUTTING FORCEPS)
FLOOR PAD 36X40 (MISCELLANEOUS) ×2
FORCEPS BIOP RAD 4 LRG CAP 4 (CUTTING FORCEPS) IMPLANT
FORCEPS BIOP RJ4 240 W/NDL (CUTTING FORCEPS)
FORCEPS BXJMBJMB 240X2.8X (CUTTING FORCEPS) IMPLANT
INJECTOR/SNARE I SNARE (MISCELLANEOUS) IMPLANT
LUBRICANT JELLY 4.5OZ STERILE (MISCELLANEOUS) IMPLANT
MANIFOLD NEPTUNE II (INSTRUMENTS) IMPLANT
NDL SCLEROTHERAPY 25GX240 (NEEDLE) IMPLANT
NEEDLE SCLEROTHERAPY 25GX240 (NEEDLE)
PAD FLOOR 36X40 (MISCELLANEOUS) ×3 IMPLANT
PROBE APC STR FIRE (PROBE) IMPLANT
PROBE INJECTION GOLD (MISCELLANEOUS)
PROBE INJECTION GOLD 7FR (MISCELLANEOUS) IMPLANT
SNARE ROTATE MED OVAL 20MM (MISCELLANEOUS) IMPLANT
SYR 50ML LL SCALE MARK (SYRINGE) IMPLANT
TRAP SPECIMEN MUCOUS 40CC (MISCELLANEOUS) IMPLANT
TUBING ENDO SMARTCAP PENTAX (MISCELLANEOUS) IMPLANT
TUBING IRRIGATION ENDOGATOR (MISCELLANEOUS) ×3 IMPLANT
WATER STERILE IRR 1000ML POUR (IV SOLUTION) IMPLANT

## 2022-12-02 NOTE — Anesthesia Postprocedure Evaluation (Signed)
Anesthesia Post Note  Patient: Hannah Green  Procedure(s) Performed: COLONOSCOPY WITH PROPOFOL POLYPECTOMY     Patient location during evaluation: PACU Anesthesia Type: MAC Level of consciousness: awake and alert Pain management: pain level controlled Vital Signs Assessment: post-procedure vital signs reviewed and stable Respiratory status: spontaneous breathing, nonlabored ventilation, respiratory function stable and patient connected to nasal cannula oxygen Cardiovascular status: stable and blood pressure returned to baseline Postop Assessment: no apparent nausea or vomiting Anesthetic complications: no   No notable events documented.  Last Vitals:  Vitals:   12/02/22 0920 12/02/22 0930  BP: (!) 103/34 (!) 138/49  Pulse: 83 84  Resp: 16 19  Temp:    SpO2: 94% 94%    Last Pain:  Vitals:   12/02/22 0930  TempSrc:   PainSc: 0-No pain                 Earl Lites P Kendryck Lacroix

## 2022-12-02 NOTE — Anesthesia Preprocedure Evaluation (Addendum)
Anesthesia Evaluation  Patient identified by MRN, date of birth, ID band Patient awake    Reviewed: Allergy & Precautions, NPO status , Patient's Chart, lab work & pertinent test results, reviewed documented beta blocker date and time   Airway Mallampati: IV  TM Distance: >3 FB Neck ROM: Full  Mouth opening: Limited Mouth Opening  Dental no notable dental hx.    Pulmonary neg sleep apnea, pneumonia, COPD, neg recent URI, former smoker, neg PE   breath sounds clear to auscultation       Cardiovascular (-) hypertension(-) angina + CAD  (-) Past MI, (-) CHF and (-) DVT  Rhythm:Regular Rate:Normal  Normal recent TTE   Neuro/Psych neg Seizures    GI/Hepatic ,GERD  ,,(+) neg Cirrhosis        Endo/Other    Renal/GU Renal disease     Musculoskeletal  (+) Arthritis ,    Abdominal   Peds  Hematology   Anesthesia Other Findings   Reproductive/Obstetrics                             Anesthesia Physical Anesthesia Plan  ASA: 2  Anesthesia Plan: MAC   Post-op Pain Management:    Induction: Intravenous  PONV Risk Score and Plan: 2 and Propofol infusion  Airway Management Planned:   Additional Equipment:   Intra-op Plan:   Post-operative Plan:   Informed Consent: I have reviewed the patients History and Physical, chart, labs and discussed the procedure including the risks, benefits and alternatives for the proposed anesthesia with the patient or authorized representative who has indicated his/her understanding and acceptance.     Dental advisory given  Plan Discussed with: CRNA  Anesthesia Plan Comments:        Anesthesia Quick Evaluation

## 2022-12-02 NOTE — Transfer of Care (Signed)
Immediate Anesthesia Transfer of Care Note  Patient: Hannah Green  Procedure(s) Performed: COLONOSCOPY WITH PROPOFOL POLYPECTOMY  Patient Location: Endoscopy Unit  Anesthesia Type:MAC  Level of Consciousness: awake and alert   Airway & Oxygen Therapy: Patient Spontanous Breathing  Post-op Assessment: Report given to RN and Post -op Vital signs reviewed and stable  Post vital signs: Reviewed and stable  Last Vitals:  Vitals Value Taken Time  BP    Temp    Pulse 86 12/02/22 0915  Resp 19 12/02/22 0915  SpO2 96 % 12/02/22 0915  Vitals shown include unfiled device data.  Last Pain:  Vitals:   12/02/22 0819  TempSrc: Temporal  PainSc: 0-No pain         Complications: No notable events documented.

## 2022-12-02 NOTE — Op Note (Signed)
[facility] Patient Name: Hannah Green Procedure Date: 12/02/2022 MRN: [id number] Attending MD: Jeani Hawking , MD, [id number] Date of Birth: 1945/12/07 CSN: [id number] Age: 77 Admit Type: Outpatient Procedure:                Colonoscopy Indications:              High risk colon cancer surveillance: Personal                            history of colonic polyps Providers:                Jeani Hawking, MD, Suzy Bouchard, RN, Norman Clay, RN,                            Kandice Robinsons, Technician, Geoffery Lyons,                            Technician Referring MD:              Medicines:                Propofol per Anesthesia Complications:            No immediate complications. Estimated Blood Loss:     Estimated blood loss: none. Procedure:                Pre-Anesthesia Assessment:                           - Prior to the procedure, a History and Physical                            was performed, and patient medications and                            allergies were reviewed. The patient's tolerance of                            previous anesthesia was also reviewed. The risks                            and benefits of the procedure and the sedation                            options and risks were discussed with the patient.                            All questions were answered, and informed consent                            was obtained. Prior Anticoagulants: The patient has                            taken no anticoagulant or antiplatelet agents. ASA                            Grade Assessment: III -  A patient with severe                            systemic disease. After reviewing the risks and                            benefits, the patient was deemed in satisfactory                            condition to undergo the procedure.                           - Sedation was administered by an anesthesia                            professional. Deep sedation was  attained.                           After obtaining informed consent, the colonoscope                            was passed under direct vision. Throughout the                            procedure, the patient's blood pressure, pulse, and                            oxygen saturations were monitored continuously. The                            CF-HQ190L ([id number]) Olympus colonoscope was                            introduced through the anus and advanced to the the                            cecum, identified by appendiceal orifice and                            ileocecal valve. The colonoscopy was performed with                            difficulty due to significant looping and a                            tortuous colon. Successful completion of the                            procedure was aided by using manual pressure and                            straightening and shortening the scope to obtain  bowel loop reduction. The patient tolerated the                            procedure well. The quality of the bowel                            preparation was evaluated using the BBPS [facility]                            Bowel Preparation Scale) with scores of: Right                            Colon = 3 (entire mucosa seen well with no residual                            staining, small fragments of stool or opaque                            liquid), Transverse Colon = 3 (entire mucosa seen                            well with no residual staining, small fragments of                            stool or opaque liquid) and Left Colon = 2 (minor                            amount of residual staining, small fragments of                            stool and/or opaque liquid, but mucosa seen well).                            The total BBPS score equals 8. The quality of the                            bowel preparation was good. The ileocecal valve,                             appendiceal orifice, and rectum were photographed. Scope In: 8:47:39 AM Scope Out: 9:10:00 AM Scope Withdrawal Time: 0 hours 11 minutes 43 seconds  Total Procedure Duration: 0 hours 22 minutes 21 seconds  Findings:      Two sessile polyps were found in the transverse colon. The polyps were 3       to 8 mm in size. These polyps were removed with a cold snare. Resection       and retrieval were complete.      A single medium-sized localized angiodysplastic lesion without bleeding       was found in the ascending colon.      Scattered large-mouthed, medium-mouthed and small-mouthed diverticula       were found in the entire colon.      The distal sigmoid colon was very difficult to traverse.  With moderate       forward pressure, abdominal pressure, and tip deflection the colonoscope       was able to advanced. Further advancement was made with reduction of the       colonoscope. The diverticula in this region was the reason for the       difficulty. Impression:               - Two 3 to 8 mm polyps in the transverse colon,                            removed with a cold snare. Resected and retrieved.                           - A single non-bleeding colonic angiodysplastic                            lesion.                           - Diverticulosis in the entire examined colon. Moderate Sedation:      Not Applicable - Patient had care per Anesthesia. Recommendation:           - Patient has a contact number available for                            emergencies. The signs and symptoms of potential                            delayed complications were discussed with the                            patient. Return to normal activities tomorrow.                            Written discharge instructions were provided to the                            patient.                           - Resume previous diet.                           - Continue present medications.                           -  Await pathology results.                           - Repeat colonoscopy is not recommended for                            surveillance. Procedure Code(s):        --- Professional ---                           [phone],  Colonoscopy, flexible; with removal of                            tumor(s), polyp(s), or other lesion(s) by snare                            technique Diagnosis Code(s):        --- Professional ---                           Z86.010, Personal history of colonic polyps                           D12.3, Benign neoplasm of transverse colon (hepatic                            flexure or splenic flexure)                           K55.20, Angiodysplasia of colon without hemorrhage                           K57.30, Diverticulosis of large intestine without                            perforation or abscess without bleeding CPT copyright 2022 [employer]. All rights reserved. The codes documented in this report are preliminary and upon coder review may  be revised to meet current compliance requirements. Jeani Hawking, MD Jeani Hawking, MD 12/02/2022 9:21:26 AM This report has been signed electronically. Number of Addenda: 0

## 2022-12-02 NOTE — H&P (Signed)
Hannah Green HPI: This 77 year old white female presents to the office for a follow after having an EGD which was done 06/24/2022. She was noted to have a hiatal hernia, gastroesophageal flap value classified as Hill Gade IV with normal appearing stomach and duodenum. She takes Omeprazole 40 mg in the mornings with good control of her reflux. She has problems with constipation and can go 2-3 days without having a BM. She has 1 BM every other day, on an average. There is no obvious blood or mucus in the stool. She has good appetite. She has gained 8 pounds over the past 2 months. She denies having any complaints of abdominal pain, nausea, vomiting, dysphagia or odynophagia. She denies having a family history of colon cancer, celiac sprue or IBD. Her sister had esophageal cancer. Hannah Green had a barium swallow done on 05/04/2022 when she was diagnosed with functional oropharyngeal dysphagia. She was advised to cut her meats up into small pieces and chew well. 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 Her last colonoscopy was done on 11/12/2010 and a tubular adenoma was removed. Patient did not show up for any follow-up colonoscopies in spite of several reminders.   Past Medical History:  Diagnosis Date   Breast cancer (HCC) 1999   Cataract 2017   resolved with surgery   COPD (chronic obstructive pulmonary disease) (HCC)    Coronary artery disease    Dysplastic nevus 09/04/2012   R prox calf - mild    GERD (gastroesophageal reflux disease)    Hyperlipidemia    Postoperative atrial fibrillation (HCC) 03/2018   Rheumatoid arthritis(714.0)    Squamous cell carcinoma of lung, stage I, right (HCC) 2020   Status post right upper and middle lobectomy    Past Surgical History:  Procedure Laterality Date   BREAST LUMPECTOMY   1999   left, needed radiation   CATARACT EXTRACTION W/ INTRAOCULAR LENS  IMPLANT, BILATERAL Bilateral 05/19/15, 06/02/15   CHEST TUBE INSERTION Right 04/23/2018   Procedure: Right Lateral Chest Tube Insertion;  Surgeon: Loreli Slot, MD;  Location: [facility] OR;  Service: Thoracic;  Laterality: Right;   CHOLECYSTECTOMY     ESOPHAGOGASTRODUODENOSCOPY (EGD) WITH PROPOFOL N/A 06/24/2022   Procedure: ESOPHAGOGASTRODUODENOSCOPY (EGD) WITH PROPOFOL;  Surgeon: Jeani Hawking, MD;  Location: [facility];  Service: Gastroenterology;  Laterality: N/A;   LOBECTOMY Right 04/23/2018   Procedure: RIGHT UPPER LOBECTOMY;  Surgeon: Loreli Slot, MD;  Location: [facility] OR;  Service: Thoracic;  Laterality: Right;   NM MYOVIEW LTD  01/2016   LOW RISK. Hypertensive response to exercise followed by significant drop (stage I pressure 211/70 drop to 118/75. Stage II). Apical artifact but otherwise no ischemia or infarction. EF 64%.   NODE DISSECTION Right 04/23/2018   Procedure: MEDIASTINAL NODE DISSECTION;  Surgeon: Loreli Slot, MD;  Location: [facility] OR;  Service: Thoracic;  Laterality: Right;   RIGHT/LEFT HEART CATH AND CORONARY ANGIOGRAPHY N/A 04/10/2018   Procedure: RIGHT/LEFT HEART CATH AND CORONARY ANGIOGRAPHY;  Surgeon: Marykay Lex, MD;  Location: [facility] INVAS[facility] LAB;; angiographically minimal coronary artery disease.  Normal LVEDP.  Basely normal RHC pressures with no suggestion of pulmonary hypertension.  Normal CO-CI by Fick (5.9-2.8)   SAVORY DILATION N/A 06/24/2022   Procedure: SAVORY DILATION;  Surgeon: Jeani Hawking, MD;  Location: [facility];  Service: Gastroenterology;  Laterality: N/A;   TONSILLECTOMY     TRANSTHORACIC ECHOCARDIOGRAM  11/'17; 2/'20   a) Nov 2017: Mild LVH. EF 60-65%. GR 2-DD; b) Feb 2020: Normal LV size and function.  EF 60 to 65%.  No R WMA.  GR 1 DD.  Normal RV size and function.  Normal pressures.  No valvular disease.   VIDEO ASSISTED THORACOSCOPY (VATS)/WEDGE RESECTION  Right 04/23/2018   Procedure: VIDEO ASSISTED THORACOSCOPY (VATS)/WEDGE RESECTION OF RIGHT UPPER LUNG;  Surgeon: Loreli Slot, MD;  Location: [facility] OR;  Service: Thoracic;  Laterality: Right;    Family History  Problem Relation Age of Onset   Cancer Mother        breast   Cancer Father        lung cancer   Cancer Sister        throat cancer   Diabetes Paternal Uncle    Diabetes Maternal Grandmother     Social History:  reports that she quit smoking about 11 years ago. Her smoking use included cigarettes. She started smoking about 61 years ago. She has a 75 pack-year smoking history. She has never used smokeless tobacco. She reports current alcohol use of about 2.0 standard drinks of alcohol per week. She reports that she does not use drugs.  Allergies:  Allergies  Allergen Reactions   Adhesive [Tape] Rash    Paper tape causes rash   Neosporin [Neomycin-Bacitracin Zn-Polymyx] Itching   Penicillins Rash    Did it involve swelling of the face/tongue/throat, SOB, or low BP? No Did it involve sudden or severe rash/hives, skin peeling, or any reaction on the inside of your mouth or nose? Yes Did you need to seek medical attention at a hospital or doctor's office? Yes When did it last happen? From childhood If all above answers are "NO", may proceed with cephalosporin use.     Medications: Scheduled: Continuous:  lactated ringers 20 mL/hr (12/02/22 0824)    No results found for this or any previous visit (from the past 24 hour(s)).   No results found.  ROS:  As stated above in the HPI otherwise negative.  Blood pressure (!) 178/63, pulse 97, temperature 98.4 F (36.9 C), temperature source Temporal, resp. rate 20, height 5' 6.5" (1.689 m), weight 99.8 kg, SpO2 94%.    PE: Gen: NAD, Alert and Oriented HEENT:  [place]/AT, EOMI Neck: Supple, no LAD Lungs: CTA Bilaterally CV: RRR without M/G/R ABD: Soft, NTND, +BS Ext: No C/C/E  Assessment/Plan: 1) Personal history polyps  - colonoscopy.  Hannah Green 12/02/2022, 8:24 AM

## 2022-12-02 NOTE — Discharge Instructions (Signed)

## 2022-12-03 ENCOUNTER — Other Ambulatory Visit: Payer: Self-pay

## 2022-12-03 ENCOUNTER — Ambulatory Visit
Admission: EM | Admit: 2022-12-03 | Discharge: 2022-12-03 | Disposition: A | Discharge status: Home / Self Care | Payer: Medicare HMO | Attending: Emergency Medicine | Admitting: Emergency Medicine

## 2022-12-03 ENCOUNTER — Encounter: Payer: Self-pay | Admitting: Emergency Medicine

## 2022-12-03 DIAGNOSIS — U071 COVID-19: Secondary | ICD-10-CM | POA: Insufficient documentation

## 2022-12-03 DIAGNOSIS — R079 Chest pain, unspecified: Secondary | ICD-10-CM | POA: Diagnosis not present

## 2022-12-03 DIAGNOSIS — R0602 Shortness of breath: Secondary | ICD-10-CM | POA: Insufficient documentation

## 2022-12-03 DIAGNOSIS — J441 Chronic obstructive pulmonary disease with (acute) exacerbation: Secondary | ICD-10-CM | POA: Diagnosis not present

## 2022-12-03 MED ORDER — DOXYCYCLINE HYCLATE 100 MG PO CAPS
100.0000 mg | ORAL_CAPSULE | Freq: Two times a day (BID) | ORAL | 0 refills | Status: DC
Start: 2022-12-03 — End: 2022-12-10

## 2022-12-03 MED ORDER — PREDNISONE 10 MG PO TABS
40.0000 mg | ORAL_TABLET | Freq: Every day | ORAL | 0 refills | Status: AC
Start: 2022-12-03 — End: 2022-12-08

## 2022-12-03 NOTE — Discharge Instructions (Addendum)
Go to the emergency department for evaluation of your chest pain and shortness of breath.    If you will not go to the emergency department today, follow-up with your primary care provider on Monday.    Take the doxycycline and prednisone as directed.

## 2022-12-03 NOTE — ED Triage Notes (Signed)
Pt here or congestion, cough, and chills for 2 days.  + dyspnea.  Mildly labored after ambulation to room.  Sats WNL.  Pt only has one lobe to right lung s/p CA surgery.  It "feels like a brick sitting on my chest".

## 2022-12-03 NOTE — ED Notes (Signed)
Pt recommended by NP to go to ED r/t sx and medical hx.  Pt refusing to go to ED.

## 2022-12-03 NOTE — ED Provider Notes (Signed)
Hannah Green    CSN: [id number] Arrival date & time: 12/03/22  1329      History   Chief Complaint Chief Complaint  Patient presents with   Nasal Congestion    HPI Hannah Green is a 77 y.o. female.  Patient presents with 1-2 day history of congestion, cough, shortness of breath.  The shortness of breath is worse with exertion.  She states her chest feels tight which she attributes to chest congestion.  She denies numbness, weakness, dizziness, or other symptoms.  No OTC medications taken today.  Patient has history of lung cancer with removal of right upper and middle lobes.  She also has history of COPD.  The history is provided by the patient and medical records.    Past Medical History:  Diagnosis Date   Breast cancer (HCC) 1999   Cataract 2017   resolved with surgery   COPD (chronic obstructive pulmonary disease) (HCC)    Coronary artery disease    Dysplastic nevus 09/04/2012   R prox calf - mild    GERD (gastroesophageal reflux disease)    Hyperlipidemia    Postoperative atrial fibrillation (HCC) 03/2018   Rheumatoid arthritis(714.0)    Squamous cell carcinoma of lung, stage I, right (HCC) 2020   Status post right upper and middle lobectomy    Patient Active Problem List   Diagnosis Date Noted   Pedal edema 09/20/2022   ETD (Eustachian tube dysfunction), bilateral 03/31/2022   Tremor 02/18/2020   Stage I squamous cell carcinoma of right lung (HCC) 06/12/2018   HCAP (healthcare-associated pneumonia) 05/08/2018   History of lung cancer 04/24/2018   S/P Right Upper Lobectomy, Right Middle Lobectomy Lung 04/23/2018   Pain and swelling of wrist, right 04/18/2018   Prediabetes 01/08/2018   COPD exacerbation (HCC) 04/27/2017   COPD (chronic obstructive pulmonary disease) (HCC) 12/07/2016   Diastolic dysfunction without heart failure 02/01/2016   Osteopenia of femoral neck 01/12/2016   Rosacea 12/22/2015   Constipation, chronic 11/11/2014   Basal cell  carcinoma of left side of nose 06/01/2012   GERD (gastroesophageal reflux disease)    Vitamin D deficiency 02/13/2008   ADENOCARCINOMA, BREAST 02/08/2008   Hyperlipidemia with target LDL less than 100 02/08/2008   Rheumatoid arthritis (HCC) 02/08/2008    Past Surgical History:  Procedure Laterality Date   BREAST LUMPECTOMY  1999   left, needed radiation   CATARACT EXTRACTION W/ INTRAOCULAR LENS  IMPLANT, BILATERAL Bilateral 05/19/15, 06/02/15   CHEST TUBE INSERTION Right 04/23/2018   Procedure: Right Lateral Chest Tube Insertion;  Surgeon: Loreli Slot, MD;  Location: [facility] OR;  Service: Thoracic;  Laterality: Right;   CHOLECYSTECTOMY     ESOPHAGOGASTRODUODENOSCOPY (EGD) WITH PROPOFOL N/A 06/24/2022   Procedure: ESOPHAGOGASTRODUODENOSCOPY (EGD) WITH PROPOFOL;  Surgeon: Jeani Hawking, MD;  Location: [facility] ENDOSCOPY;  Service: Gastroenterology;  Laterality: N/A;   LOBECTOMY Right 04/23/2018   Procedure: RIGHT UPPER LOBECTOMY;  Surgeon: Loreli Slot, MD;  Location: [facility] OR;  Service: Thoracic;  Laterality: Right;   NM MYOVIEW LTD  01/2016   LOW RISK. Hypertensive response to exercise followed by significant drop (stage I pressure 211/70 drop to 118/75. Stage II). Apical artifact but otherwise no ischemia or infarction. EF 64%.   NODE DISSECTION Right 04/23/2018   Procedure: MEDIASTINAL NODE DISSECTION;  Surgeon: Loreli Slot, MD;  Location: [facility] OR;  Service: Thoracic;  Laterality: Right;   RIGHT/LEFT HEART CATH AND CORONARY ANGIOGRAPHY N/A 04/10/2018   Procedure: RIGHT/LEFT HEART CATH AND CORONARY  ANGIOGRAPHY;  Surgeon: Marykay Lex, MD;  Location: [facility] INVASIVE CV LAB;; angiographically minimal coronary artery disease.  Normal LVEDP.  Basely normal RHC pressures with no suggestion of pulmonary hypertension.  Normal CO-CI by Fick (5.9-2.8)   SAVORY DILATION N/A 06/24/2022   Procedure: SAVORY DILATION;  Surgeon: Jeani Hawking, MD;  Location: [facility] ENDOSCOPY;  Service: Gastroenterology;   Laterality: N/A;   TONSILLECTOMY     TRANSTHORACIC ECHOCARDIOGRAM  11/'17; 2/'20   a) Nov 2017: Mild LVH. EF 60-65%. GR 2-DD; b) Feb 2020: Normal LV size and function.  EF 60 to 65%.  No R WMA.  GR 1 DD.  Normal RV size and function.  Normal pressures.  No valvular disease.   VIDEO ASSISTED THORACOSCOPY (VATS)/WEDGE RESECTION Right 04/23/2018   Procedure: VIDEO ASSISTED THORACOSCOPY (VATS)/WEDGE RESECTION OF RIGHT UPPER LUNG;  Surgeon: Loreli Slot, MD;  Location: [facility] OR;  Service: Thoracic;  Laterality: Right;    OB History   No obstetric history on file.      Home Medications    Prior to Admission medications   Medication Sig Start Date End Date Taking? Authorizing Provider  doxycycline (VIBRAMYCIN) 100 MG capsule Take 1 capsule (100 mg total) by mouth 2 (two) times daily for 7 days. 12/03/22 12/10/22 Yes Mickie Bail, NP  predniSONE (DELTASONE) 10 MG tablet Take 4 tablets (40 mg total) by mouth daily for 5 days. 12/03/22 12/08/22 Yes Mickie Bail, NP  atorvastatin (LIPITOR) 10 MG tablet Take 1 tablet (10 mg total) by mouth daily. 05/18/22   Bedsole, Amy E, MD  Budeson-Glycopyrrol-Formoterol (BREZTRI AEROSPHERE) 160-9-4.8 MCG/ACT AERO Inhale 2 puffs into the lungs daily as needed. 03/31/22   Bedsole, Amy E, MD  Cholecalciferol (VITAMIN D3) 1.25 MG (50000 UT) CAPS Take 1 capsule by mouth once a week. 12/23/21   Bedsole, Amy E, MD  Fluticasone-Umeclidin-Vilant (TRELEGY ELLIPTA) 100-62.5-25 MCG/ACT AEPB Inhale 1 puff into the lungs daily. 10/19/21   Bedsole, Amy E, MD  Ivermectin 1 % CREA Apply 1 Application topically as directed. Apply to face qd to bid  for Rosacea 07/12/22   Deirdre Evener, MD  minoxidil (LONITEN) 2.5 MG tablet Take 1 tablet (2.5 mg total) by mouth daily. 07/12/22   Deirdre Evener, MD  omeprazole (PRILOSEC) 40 MG capsule Take 40 mg by mouth daily.    [provider]    Family History Family History  Problem Relation Age of Onset   Cancer Mother         breast   Cancer Father        lung cancer   Cancer Sister        throat cancer   Diabetes Paternal Uncle    Diabetes Maternal Grandmother     Social History Social History   Tobacco Use   Smoking status: Former    Current packs/day: 0.00    Average packs/day: 1.5 packs/day for 50.0 years (75.0 ttl pk-yrs)    Types: Cigarettes    Start date: 03/31/1961    Quit date: 04/01/2011    Years since quitting: 11.6   Smokeless tobacco: Never   Tobacco comments:    has stopped but has started again in "crisis"  Vaping Use   Vaping status: Never Used  Substance Use Topics   Alcohol use: Yes    Alcohol/week: 2.0 standard drinks of alcohol    Types: 2 Glasses of wine per week    Comment: monthly   Drug use: No  Allergies   Adhesive [tape], Neosporin [neomycin-bacitracin zn-polymyx], and Penicillins   Review of Systems Review of Systems  Constitutional:  Positive for chills. Negative for fever.  HENT:  Positive for congestion. Negative for ear pain and sore throat.   Respiratory:  Positive for cough and shortness of breath.   Cardiovascular:  Positive for chest pain. Negative for palpitations.  Neurological:  Negative for dizziness, syncope, facial asymmetry, speech difficulty, weakness, numbness and headaches.     Physical Exam Triage Vital Signs ED Triage Vitals  Encounter Vitals Group     BP 12/03/22 1354 (!) 143/83     Systolic BP Percentile --      Diastolic BP Percentile --      Pulse Rate 12/03/22 1354 84     Resp 12/03/22 1354 (!) 24     Temp 12/03/22 1354 99 F (37.2 C)     Temp Source 12/03/22 1354 Oral     SpO2 12/03/22 1354 96 %     Weight 12/03/22 1355 215 lb (97.5 kg)     Height 12/03/22 1355 5\' 7"  (1.702 m)     Head Circumference --      Peak Flow --      Pain Score 12/03/22 1354 0     Pain Loc --      Pain Education --      Exclude from Growth Chart --    No data found.  Updated Vital Signs BP (!) 143/83   Pulse 84   Temp 99 F (37.2 C)  (Oral)   Resp 20   Ht 5\' 7"  (1.702 m)   Wt 215 lb (97.5 kg)   LMP  (LMP Unknown)   SpO2 98%   BMI 33.67 kg/m   Visual Acuity Right Eye Distance:   Left Eye Distance:   Bilateral Distance:    Right Eye Near:   Left Eye Near:    Bilateral Near:     Physical Exam Constitutional:      General: She is not in acute distress. HENT:     Right Ear: Tympanic membrane normal.     Left Ear: Tympanic membrane normal.     Ears:     Comments: Left PE tube intact.    Nose: Congestion and rhinorrhea present.     Mouth/Throat:     Mouth: Mucous membranes are moist.     Pharynx: Oropharynx is clear.  Cardiovascular:     Rate and Rhythm: Normal rate and regular rhythm.     Heart sounds: Normal heart sounds.  Pulmonary:     Effort: Pulmonary effort is normal. No respiratory distress.     Breath sounds: Normal breath sounds.  Skin:    General: Skin is warm and dry.  Neurological:     General: No focal deficit present.     Mental Status: She is alert and oriented to person, place, and time.     Sensory: No sensory deficit.     Motor: No weakness.     Gait: Gait normal.  Psychiatric:        Mood and Affect: Mood normal.        Behavior: Behavior normal.      UC Treatments / Results  Labs (all labs ordered are listed, but only abnormal results are displayed) Labs Reviewed  SARS CORONAVIRUS 2 (TAT 6-24 HRS)    EKG   Radiology No results found.  Procedures Procedures (including critical care time)  Medications Ordered in UC Medications - No data to  display  Initial Impression / Assessment and Plan / UC Course  I have reviewed the triage vital signs and the nursing notes.  Pertinent labs & imaging results that were available during my care of the patient were reviewed by me and considered in my medical decision making (see chart for details).   Shortness of breath, COPD exacerbation, chest pain.  Patient adamantly declines transfer to the ED.  Discussed limitations of  evaluation of chest pain and shortness of breath in an urgent care setting; discussed that if she will not go to the ED today, to please follow-up with her PCP on Monday.  Discussed the need to go to the ED if her symptoms persist or worsen.  EKG shows sinus rhythm, rate 73, no ST elevation, compared to previous from 09/20/2022.  While at rest, no tachypnea and O2 sat 98% on room air.  Treating with doxycycline and prednisone.  COVID pending.  Education provided on shortness of breath, chest pain, COPD exacerbation.  Patient agrees to plan of care.  Final Clinical Impressions(s) / UC Diagnoses   Final diagnoses:  Shortness of breath  COPD exacerbation (HCC)  Chest pain, unspecified type     Discharge Instructions      Go to the emergency department for evaluation of your chest pain and shortness of breath.    If you will not go to the emergency department today, follow-up with your primary care provider on Monday.    Take the doxycycline and prednisone as directed.         ED Prescriptions     Medication Sig Dispense Auth. Provider   doxycycline (VIBRAMYCIN) 100 MG capsule Take 1 capsule (100 mg total) by mouth 2 (two) times daily for 7 days. 14 capsule Mickie Bail, NP   predniSONE (DELTASONE) 10 MG tablet Take 4 tablets (40 mg total) by mouth daily for 5 days. 20 tablet Mickie Bail, NP      PDMP not reviewed this encounter.   Mickie Bail, NP 12/03/22 1431

## 2022-12-04 ENCOUNTER — Telehealth: Payer: Self-pay | Admitting: Emergency Medicine

## 2022-12-04 LAB — SARS CORONAVIRUS 2 (TAT 6-24 HRS): SARS Coronavirus 2: POSITIVE — AB

## 2022-12-04 MED ORDER — NIRMATRELVIR/RITONAVIR (PAXLOVID)TABLET: 3.0000 | ORAL_TABLET | Freq: Two times a day (BID) | ORAL | 0 refills | Status: AC

## 2022-12-04 NOTE — Telephone Encounter (Signed)
Telephone call to patient to discuss positive COVID test result.  Discussed treatment with Paxlovid.  GFR >60 on 10/17/2022.  Discussed that Paxlovid is emergency authorized for treatment of COVID.  Discussed the side effects of Paxlovid, including dysgeusia, diarrhea, myalgias, hypertension.  Instructed her to pause her statin while on Paxlovid. Instructed patient to stop the doxycycline and continue the prednisone.  Instructed patient to follow-up with her PCP tomorrow.  Strict ED precautions given.  She agrees to plan of care.

## 2022-12-05 ENCOUNTER — Encounter (HOSPITAL_COMMUNITY): Payer: Self-pay | Admitting: Gastroenterology

## 2022-12-05 LAB — SURGICAL PATHOLOGY

## 2022-12-06 ENCOUNTER — Ambulatory Visit (INDEPENDENT_AMBULATORY_CARE_PROVIDER_SITE_OTHER): Payer: Medicare HMO | Admitting: Family Medicine

## 2022-12-06 ENCOUNTER — Encounter: Payer: Self-pay | Admitting: Family Medicine

## 2022-12-06 VITALS — BP 128/76 | HR 72 | Temp 98.0°F | Ht 67.0 in | Wt 231.0 lb

## 2022-12-06 DIAGNOSIS — E66812 Obesity, class 2: Secondary | ICD-10-CM | POA: Insufficient documentation

## 2022-12-06 DIAGNOSIS — R7303 Prediabetes: Secondary | ICD-10-CM

## 2022-12-06 DIAGNOSIS — J441 Chronic obstructive pulmonary disease with (acute) exacerbation: Secondary | ICD-10-CM | POA: Diagnosis not present

## 2022-12-06 DIAGNOSIS — Z6836 Body mass index (BMI) 36.0-36.9, adult: Secondary | ICD-10-CM | POA: Diagnosis not present

## 2022-12-06 DIAGNOSIS — U071 COVID-19: Secondary | ICD-10-CM | POA: Insufficient documentation

## 2022-12-06 LAB — POCT GLYCOSYLATED HEMOGLOBIN (HGB A1C): Hemoglobin A1C: 5.8 % — AB (ref 4.0–5.6)

## 2022-12-06 MED ORDER — TIRZEPATIDE 2.5 MG/0.5ML ~~LOC~~ SOAJ: 2.5000 mg | SUBCUTANEOUS | 6 refills | Status: DC

## 2022-12-06 MED ORDER — PHENTERMINE HCL 15 MG PO CAPS: 15.0000 mg | ORAL_CAPSULE | ORAL | 0 refills | Status: DC

## 2022-12-06 NOTE — Addendum Note (Signed)
Addended by: Kerby Nora E on: 12/06/2022 12:44 PM   Modules accepted: Orders, Level of Service

## 2022-12-06 NOTE — Addendum Note (Signed)
Addended byKerby Nora E on: 12/06/2022 01:03 PM   Modules accepted: Orders

## 2022-12-06 NOTE — Assessment & Plan Note (Signed)
Chronic, stable control 

## 2022-12-06 NOTE — Assessment & Plan Note (Addendum)
Chronic, associated with multiple comorbidities including prediabetes and high cholesterol. Her obesity significantly worsens her ability to breathe and ambulate. She has tried and failed multiple diet and exercise regimens.  She is severely limited with exercise given her COPD.  She has tried and failed low-carb diet, weight watchers, Atkins as well as diet direct. She is ready and willing to continue working on dietary and lifestyle changes but would be an excellent candidate for Mounjaro. She does not have any evidence of cardiovascular disease on catheterization in 2024 or past MRI or diabetes... So Medicare will not cover this medication.  Will try phentermine 15 mg daily with follow-up in 3 to 4 weeks for BP and weight reevaluation.  She will follow her blood pressure at home on this new medication.

## 2022-12-06 NOTE — Assessment & Plan Note (Signed)
Acute, gradually improving with prednisone and Paxlovid to treat COVID-19. She is holding atorvastatin while on Paxlovid. She is continuing her rescue and controller inhalers.  No clear indication for chest x-ray today given improvement, no fever and no focal lung changes but if her symptoms worsen we will consider moving forward with chest x-ray and/or restarting doxycycline for bacterial infection coverage.

## 2022-12-06 NOTE — Progress Notes (Addendum)
Patient ID: Hannah Green, female    DOB: [birth year], 77 y.o.   MRN: [id number]  This visit was conducted in person.  BP 128/76   Pulse 72   Temp 98 F (36.7 C) (Oral)   Ht 5\' 7"  (1.702 m)   Wt 231 lb (104.8 kg)   LMP  (LMP Unknown)   SpO2 99%   BMI 36.18 kg/m    CC:  Chief Complaint  Patient presents with   Covid Positive    Was told by urgent care to come here to follow up on breathing     Subjective:   HPI: Hannah Green is a 77 y.o. female presenting on 12/06/2022 for Covid Positive (Was told by urgent care to come here to follow up on breathing )  Reviewed urgent care note from December 03, 2022 patient presented at that time with 1 to 2-day history of congestion cough and shortness of breath with exertion, chest tightness. History of lung cancer with removal of right upper and middle lobes as well as COPD history. She was diagnosed with COPD exacerbation and treated with prednisone 40 mg taper as well as doxycycline 100 mg p.o. twice daily x 7 days. Later COVID test returned positive... At that time on October 6 she was instructed to stop doxycycline, continue prednisone and start Paxlovid course.  Today she reports she has improved some but she is still very SOB with exertion.  No further fever. Less ST, less runny nose.  Using Breztri 3-4 times a day.  Continuing Trelegy.    Had colonoscopy on  10/4    She has been going to gym, silver sneakers over the last 2 years.. breathing is limiting her more now with COPD in last year.  She has been working on low carb diet, high protein diet.   She has tried weight watchers several times and failed.  She was unsuccessful with Atkins and diet direct... unable to maintain any weight loss.  Nml Cath in 2020, but has  diastolic HF.  No hypertension or palpitations.  Lab Results  Component Value Date   HGBA1C 5.8 (A) 12/06/2022     Relevant past medical, surgical, family and social history reviewed and updated as  indicated. Interim medical history since our last visit reviewed. Allergies and medications reviewed and updated. Outpatient Medications Prior to Visit  Medication Sig Dispense Refill   Budeson-Glycopyrrol-Formoterol (BREZTRI AEROSPHERE) 160-9-4.8 MCG/ACT AERO Inhale 2 puffs into the lungs daily as needed. 10.7 g 11   Cholecalciferol (VITAMIN D3) 1.25 MG (50000 UT) CAPS Take 1 capsule by mouth once a week. 12 capsule 3   Fluticasone-Umeclidin-Vilant (TRELEGY ELLIPTA) 100-62.5-25 MCG/ACT AEPB Inhale 1 puff into the lungs daily. 1 each 11   Ivermectin 1 % CREA Apply 1 Application topically as directed. Apply to face qd to bid  for Rosacea 30 g 11   minoxidil (LONITEN) 2.5 MG tablet Take 1 tablet (2.5 mg total) by mouth daily. 90 tablet 1   nirmatrelvir/ritonavir (PAXLOVID) 20 x 150 MG & 10 x 100MG  TABS Take 3 tablets by mouth 2 (two) times daily for 5 days. Take nirmatrelvir (150 mg) two tablets twice daily for 5 days and ritonavir (100 mg) one tablet twice daily for 5 days. 30 tablet 0   omeprazole (PRILOSEC) 40 MG capsule Take 40 mg by mouth daily.     predniSONE (DELTASONE) 10 MG tablet Take 4 tablets (40 mg total) by mouth daily for 5 days. 20 tablet 0  atorvastatin (LIPITOR) 10 MG tablet Take 1 tablet (10 mg total) by mouth daily. (Patient not taking: Reported on 12/06/2022) 100 tablet 1   doxycycline (VIBRAMYCIN) 100 MG capsule Take 1 capsule (100 mg total) by mouth 2 (two) times daily for 7 days. 14 capsule 0   No facility-administered medications prior to visit.     Per HPI unless specifically indicated in ROS section below Review of Systems  Constitutional:  Negative for fatigue and fever.  HENT:  Positive for congestion.   Eyes:  Negative for pain.  Respiratory:  Positive for cough and shortness of breath.   Cardiovascular:  Negative for chest pain, palpitations and leg swelling.  Gastrointestinal:  Negative for abdominal pain.  Genitourinary:  Negative for dysuria and vaginal  bleeding.  Musculoskeletal:  Negative for back pain.  Neurological:  Negative for syncope, light-headedness and headaches.  Psychiatric/Behavioral:  Negative for dysphoric mood.    Objective:  BP 128/76   Pulse 72   Temp 98 F (36.7 C) (Oral)   Ht 5\' 7"  (1.702 m)   Wt 231 lb (104.8 kg)   LMP  (LMP Unknown)   SpO2 99%   BMI 36.18 kg/m   Wt Readings from Last 3 Encounters:  12/06/22 231 lb (104.8 kg)  12/03/22 215 lb (97.5 kg)  12/02/22 220 lb (99.8 kg)      Physical Exam Constitutional:      General: She is not in acute distress.    Appearance: Normal appearance. She is well-developed. She is not ill-appearing or toxic-appearing.  HENT:     Head: Normocephalic.     Right Ear: Hearing, tympanic membrane, ear canal and external ear normal. Tympanic membrane is not erythematous, retracted or bulging.     Left Ear: Hearing, tympanic membrane, ear canal and external ear normal. Tympanic membrane is not erythematous, retracted or bulging.     Nose: No mucosal edema or rhinorrhea.     Right Sinus: No maxillary sinus tenderness or frontal sinus tenderness.     Left Sinus: No maxillary sinus tenderness or frontal sinus tenderness.     Mouth/Throat:     Mouth: Oropharynx is clear and moist and mucous membranes are normal.     Pharynx: Uvula midline.  Eyes:     General: Lids are normal. Lids are everted, no foreign bodies appreciated.     Extraocular Movements: EOM normal.     Conjunctiva/sclera: Conjunctivae normal.     Pupils: Pupils are equal, round, and reactive to light.  Neck:     Thyroid: No thyroid mass or thyromegaly.     Vascular: No carotid bruit.     Trachea: Trachea normal.  Cardiovascular:     Rate and Rhythm: Normal rate and regular rhythm.     Pulses: Normal pulses.     Heart sounds: Normal heart sounds, S1 normal and S2 normal. No murmur heard.    No friction rub. No gallop.  Pulmonary:     Effort: Pulmonary effort is normal. No tachypnea or respiratory  distress.     Breath sounds: Wheezing present. No decreased breath sounds, rhonchi or rales.  Abdominal:     General: Bowel sounds are normal.     Palpations: Abdomen is soft.     Tenderness: There is no abdominal tenderness.  Musculoskeletal:     Cervical back: Normal range of motion and neck supple.  Skin:    General: Skin is warm, dry and intact.     Findings: No rash.  Neurological:  Mental Status: She is alert.  Psychiatric:        Mood and Affect: Mood is not anxious or depressed.        Speech: Speech normal.        Behavior: Behavior normal. Behavior is cooperative.        Thought Content: Thought content normal.        Cognition and Memory: Cognition and memory normal.        Judgment: Judgment normal.       Results for orders placed or performed in visit on 12/06/22  POCT glycosylated hemoglobin (Hb A1C)  Result Value Ref Range   Hemoglobin A1C 5.8 (A) 4.0 - 5.6 %   HbA1c POC (<> result, manual entry)     HbA1c, POC (prediabetic range)     HbA1c, POC (controlled diabetic range)      Assessment and Plan  COPD exacerbation (HCC) Assessment & Plan: Acute, gradually improving with prednisone and Paxlovid to treat COVID-19. She is holding atorvastatin while on Paxlovid. She is continuing her rescue and controller inhalers.  No clear indication for chest x-ray today given improvement, no fever and no focal lung changes but if her symptoms worsen we will consider moving forward with chest x-ray and/or restarting doxycycline for bacterial infection coverage.   COVID-19  Prediabetes Assessment & Plan: Chronic, stable control.  Orders: -     POCT glycosylated hemoglobin (Hb A1C)  Class 2 severe obesity due to excess calories with serious comorbidity and body mass index (BMI) of 36.0 to 36.9 in adult [facility]) Assessment & Plan: Chronic, associated with multiple comorbidities including prediabetes and high cholesterol. Her obesity significantly worsens her ability  to breathe and ambulate. She has tried and failed multiple diet and exercise regimens.  She is severely limited with exercise given her COPD.  She has tried and failed low-carb diet, weight watchers, Atkins as well as diet direct. She is ready and willing to continue working on dietary and lifestyle changes but would be an excellent candidate for Mounjaro. She does not have any evidence of cardiovascular disease on catheterization in 2024 or past MRI or diabetes... So Medicare will not cover this medication.  Will try phentermine 15 mg daily with follow-up in 3 to 4 weeks for BP and weight reevaluation.  She will follow her blood pressure at home on this new medication.    Other orders -     Phentermine HCl; Take 1 capsule (15 mg total) by mouth every morning. Cancel prescription for mounjaro as not covered  Dispense: 30 capsule; Refill: 0    No follow-ups on file.   Kerby Nora, MD

## 2022-12-08 ENCOUNTER — Ambulatory Visit: Payer: Medicare HMO | Admitting: Family Medicine

## 2022-12-12 ENCOUNTER — Ambulatory Visit: Payer: Medicare HMO | Admitting: Dermatology

## 2022-12-21 ENCOUNTER — Other Ambulatory Visit: Payer: Self-pay | Admitting: Family Medicine

## 2022-12-21 NOTE — Telephone Encounter (Signed)
Last office visit 12/06/2022 for COPD exacerbation.  Last refilled 12/23/2021 for #12 with 3 refills.  Vit D level 46.85 ng/mL on 03/22/2022. Next Appt: No future appointment with PCP.

## 2023-01-06 ENCOUNTER — Other Ambulatory Visit: Payer: Self-pay | Admitting: Family Medicine

## 2023-01-06 NOTE — Telephone Encounter (Signed)
Patient has been scheduled

## 2023-01-06 NOTE — Telephone Encounter (Signed)
Please schedule CPE with fasting labs prior with Dr. Ermalene Searing after 01/18/2023 [facility] visit.

## 2023-01-11 ENCOUNTER — Other Ambulatory Visit: Payer: Self-pay | Admitting: Dermatology

## 2023-01-11 DIAGNOSIS — L650 Telogen effluvium: Secondary | ICD-10-CM

## 2023-01-12 DIAGNOSIS — H18523 Epithelial (juvenile) corneal dystrophy, bilateral: Secondary | ICD-10-CM | POA: Diagnosis not present

## 2023-01-12 DIAGNOSIS — Z9842 Cataract extraction status, left eye: Secondary | ICD-10-CM | POA: Diagnosis not present

## 2023-01-12 DIAGNOSIS — H5212 Myopia, left eye: Secondary | ICD-10-CM | POA: Diagnosis not present

## 2023-01-12 DIAGNOSIS — Z9841 Cataract extraction status, right eye: Secondary | ICD-10-CM | POA: Diagnosis not present

## 2023-01-18 ENCOUNTER — Ambulatory Visit: Payer: Medicare HMO

## 2023-01-18 VITALS — BP 124/72 | Ht 67.0 in | Wt 234.2 lb

## 2023-01-18 DIAGNOSIS — Z0000 Encounter for general adult medical examination without abnormal findings: Secondary | ICD-10-CM | POA: Diagnosis not present

## 2023-01-18 DIAGNOSIS — Z23 Encounter for immunization: Secondary | ICD-10-CM

## 2023-01-18 NOTE — Patient Instructions (Addendum)
Hannah Green , Thank you for taking time to come for your Medicare Wellness Visit. I appreciate your ongoing commitment to your health goals. Please review the following plan we discussed and let me know if I can assist you in the future.   Referrals/Orders/Follow-Ups/Clinician Recommendations: none  This is a list of the screening recommended for you and due dates:  Health Maintenance  Topic Date Due   COVID-19 Vaccine (4 - 2023-24 season) 02/03/2023*   Zoster (Shingles) Vaccine (1 of 2) 04/20/2023*   DTaP/Tdap/Td vaccine (2 - Tdap) 01/18/2024*   DEXA scan (bone density measurement)  01/18/2024*   Mammogram  05/10/2023   Medicare Annual Wellness Visit  01/18/2024   Pneumonia Vaccine  Completed   Flu Shot  Completed   Hepatitis C Screening  Completed   HPV Vaccine  Aged Out   Colon Cancer Screening  Discontinued   Cologuard (Stool DNA test)  Discontinued  *Topic was postponed. The date shown is not the original due date.    Advanced directives: (Copy Requested) Please bring a copy of your health care power of attorney and living will to the office to be added to your chart at your convenience.  Next Medicare Annual Wellness Visit scheduled for next year: Yes 01/19/2024 @ 8:50am in person

## 2023-01-18 NOTE — Progress Notes (Signed)
Subjective:   Hannah Green is a 77 y.o. female who presents for Medicare Annual (Subsequent) preventive examination.  Visit Complete: Virtual I connected with  Ayak Oller Deroy on 01/18/23 by a audio enabled telemedicine application and verified that I am speaking with the correct person using two identifiers.  Patient Location: Other:  office  Provider Location: Office/Clinic  I discussed the limitations of evaluation and management by telemedicine. The patient expressed understanding and agreed to proceed.  Vital Signs: Because this visit was a virtual/telehealth visit, some criteria may be missing or patient reported. Any vitals not documented were not able to be obtained and vitals that have been documented are patient reported.  Patient Medicare AWV questionnaire was completed by the patient on (not done); I have confirmed that all information answered by patient is correct and no changes since this date.  Cardiac Risk Factors include: advanced age (>39men, >3 women);dyslipidemia;obesity (BMI >30kg/m2);sedentary lifestyle    Objective:    Today's Vitals   01/18/23 0847  BP: 124/72  Weight: 234 lb 3.2 oz (106.2 kg)  Height: 5\' 7"  (1.702 m)   Body mass index is 36.68 kg/m.     01/18/2023    9:07 AM 12/03/2022    1:57 PM 12/02/2022    8:10 AM 06/24/2022    6:37 AM 10/20/2021   12:04 PM 02/14/2020   11:15 AM 08/07/2019    2:00 PM  Advanced Directives  Does Patient Have a Medical Advance Directive? Yes Yes Yes No Yes Yes Yes  Type of Estate agent of [place];Living will  Healthcare Power of [place];Living will  Healthcare Power of [place];Living will Healthcare Power of [place];Living will   Does patient want to make changes to medical advance directive?       No - Patient declined  Copy of Healthcare Power of Attorney in Chart? No - copy requested  No - copy requested  No - copy requested No - copy requested   Would patient like information on  creating a medical advance directive?    No - Patient declined       Current Medications (verified) Outpatient Encounter Medications as of 01/18/2023  Medication Sig   atorvastatin (LIPITOR) 10 MG tablet TAKE 1 TABLET BY MOUTH EVERY DAY   Budeson-Glycopyrrol-Formoterol (BREZTRI AEROSPHERE) 160-9-4.8 MCG/ACT AERO Inhale 2 puffs into the lungs daily as needed.   D3-50 1.25 MG (50000 UT) capsule TAKE 1 CAPSULE BY MOUTH ONE TIME PER WEEK   Fluticasone-Umeclidin-Vilant (TRELEGY ELLIPTA) 100-62.5-25 MCG/ACT AEPB Inhale 1 puff into the lungs daily.   Ivermectin 1 % CREA Apply 1 Application topically as directed. Apply to face qd to bid  for Rosacea   minoxidil (LONITEN) 2.5 MG tablet TAKE 1 TABLET BY MOUTH EVERY DAY   omeprazole (PRILOSEC) 40 MG capsule Take 40 mg by mouth daily.   phentermine 15 MG capsule Take 1 capsule (15 mg total) by mouth every morning. Cancel prescription for mounjaro as not covered   No facility-administered encounter medications on file as of 01/18/2023.    Allergies (verified) Adhesive [tape], Neosporin [neomycin-bacitracin zn-polymyx], and Penicillins   History: Past Medical History:  Diagnosis Date   Breast cancer (HCC) 1999   Cataract 2017   resolved with surgery   COPD (chronic obstructive pulmonary disease) (HCC)    Coronary artery disease    Dysplastic nevus 09/04/2012   R prox calf - mild    GERD (gastroesophageal reflux disease)    Hyperlipidemia    Postoperative atrial fibrillation (  HCC) 03/2018   Rheumatoid arthritis(714.0)    Squamous cell carcinoma of lung, stage I, right (HCC) 2020   Status post right upper and middle lobectomy   Past Surgical History:  Procedure Laterality Date   BREAST LUMPECTOMY  1999   left, needed radiation   CATARACT EXTRACTION W/ INTRAOCULAR LENS  IMPLANT, BILATERAL Bilateral 05/19/15, 06/02/15   CHEST TUBE INSERTION Right 04/23/2018   Procedure: Right Lateral Chest Tube Insertion;  Surgeon: Loreli Slot, MD;   Location: [facility] OR;  Service: Thoracic;  Laterality: Right;   CHOLECYSTECTOMY     COLONOSCOPY WITH PROPOFOL N/A 12/02/2022   Procedure: COLONOSCOPY WITH PROPOFOL;  Surgeon: Jeani Hawking, MD;  Location: [facility];  Service: Gastroenterology;  Laterality: N/A;   ESOPHAGOGASTRODUODENOSCOPY (EGD) WITH PROPOFOL N/A 06/24/2022   Procedure: ESOPHAGOGASTRODUODENOSCOPY (EGD) WITH PROPOFOL;  Surgeon: Jeani Hawking, MD;  Location: [facility];  Service: Gastroenterology;  Laterality: N/A;   LOBECTOMY Right 04/23/2018   Procedure: RIGHT UPPER LOBECTOMY;  Surgeon: Loreli Slot, MD;  Location: [facility] OR;  Service: Thoracic;  Laterality: Right;   NM MYOVIEW LTD  01/2016   LOW RISK. Hypertensive response to exercise followed by significant drop (stage I pressure 211/70 drop to 118/75. Stage II). Apical artifact but otherwise no ischemia or infarction. EF 64%.   NODE DISSECTION Right 04/23/2018   Procedure: MEDIASTINAL NODE DISSECTION;  Surgeon: Loreli Slot, MD;  Location: [facility] OR;  Service: Thoracic;  Laterality: Right;   POLYPECTOMY  12/02/2022   Procedure: POLYPECTOMY;  Surgeon: Jeani Hawking, MD;  Location: [facility];  Service: Gastroenterology;;   RIGHT/LEFT HEART CATH AND CORONARY ANGIOGRAPHY N/A 04/10/2018   Procedure: RIGHT/LEFT HEART CATH AND CORONARY ANGIOGRAPHY;  Surgeon: Marykay Lex, MD;  Location: [facility];; angiographically minimal coronary artery disease.  Normal LVEDP.  Basely normal RHC pressures with no suggestion of pulmonary hypertension.  Normal CO-CI by Fick (5.9-2.8)   SAVORY DILATION N/A 06/24/2022   Procedure: SAVORY DILATION;  Surgeon: Jeani Hawking, MD;  Location: [facility];  Service: Gastroenterology;  Laterality: N/A;   TONSILLECTOMY     TRANSTHORACIC ECHOCARDIOGRAM  11/'17; 2/'20   a) Nov 2017: Mild LVH. EF 60-65%. GR 2-DD; b) Feb 2020: Normal LV size and function.  EF 60 to 65%.  No R WMA.  GR 1 DD.  Normal RV size and function.  Normal pressures.  No  valvular disease.   VIDEO ASSISTED THORACOSCOPY (VATS)/WEDGE RESECTION Right 04/23/2018   Procedure: VIDEO ASSISTED THORACOSCOPY (VATS)/WEDGE RESECTION OF RIGHT UPPER LUNG;  Surgeon: Loreli Slot, MD;  Location: [facility] OR;  Service: Thoracic;  Laterality: Right;   Family History  Problem Relation Age of Onset   Cancer Mother        breast   Cancer Father        lung cancer   Cancer Sister        throat cancer   Diabetes Paternal Uncle    Diabetes Maternal Grandmother    Social History   Socioeconomic History   Marital status: Divorced    Spouse name: Not on file   Number of children: 2   Years of education: Not on file   Highest education level: Not on file  Occupational History   Occupation: Retired Production designer, theatre/television/film    Comment: Chief Strategy Officer  Tobacco Use   Smoking status: Former    Current packs/day: 0.00    Average packs/day: 1.5 packs/day for 50.0 years (75.0 ttl pk-yrs)    Types: Cigarettes    Start  date: 03/31/1961    Quit date: 04/01/2011    Years since quitting: 11.8   Smokeless tobacco: Never   Tobacco comments:    has stopped but has started again in "crisis"  Vaping Use   Vaping status: Never Used  Substance and Sexual Activity   Alcohol use: Yes    Alcohol/week: 2.0 standard drinks of alcohol    Types: 2 Glasses of wine per week    Comment: monthly   Drug use: No   Sexual activity: Never  Other Topics Concern   Not on file  Social History Narrative   Lives alone   No living will   Requests friend Meriel Flavors to make health care decisions.   Would accept resuscitation but no prolonged ventilation   Would not want tube feeds if cognitively unaware   Social Determinants of Health   Financial Resource Strain: Low Risk  (01/18/2023)   Overall Financial Resource Strain (CARDIA)    Difficulty of Paying Living Expenses: Not hard at all  Food Insecurity: No Food Insecurity (01/18/2023)   Hunger Vital Sign    Worried About Running Out of Food in the Last Year:  Never true    Ran Out of Food in the Last Year: Never true  Transportation Needs: No Transportation Needs (01/18/2023)   PRAPARE - Administrator, Civil Service (Medical): No    Lack of Transportation (Non-Medical): No  Physical Activity: Inactive (01/18/2023)   Exercise Vital Sign    Days of Exercise per Week: 0 days    Minutes of Exercise per Session: 0 min  Stress: No Stress Concern Present (01/18/2023)   [employer] of Occupational Health - Occupational Stress Questionnaire    Feeling of Stress : Not at all  Social Connections: Moderately Isolated (01/18/2023)   Social Connection and Isolation Panel [NHANES]    Frequency of Communication with Friends and Family: More than three times a week    Frequency of Social Gatherings with Friends and Family: Three times a week    Attends Religious Services: Never    Active Member of Clubs or Organizations: Yes    Attends Engineer, structural: More than 4 times per year    Marital Status: Divorced    Tobacco Counseling Counseling given: Not Answered Tobacco comments: has stopped but has started again in "crisis"   Clinical Intake:  Pre-visit preparation completed: Yes  Pain : No/denies pain   BMI - recorded: 36.68 Nutritional Status: BMI > 30  Obese Nutritional Risks: None Diabetes: No  How often do you need to have someone help you when you read instructions, pamphlets, or other written materials from your doctor or pharmacy?: 1 - Never  Interpreter Needed?: No  Comments: lives alone Information entered by :: B.Izeyah Deike,LPN   Activities of Daily Living    01/18/2023    9:07 AM  In your present state of health, do you have any difficulty performing the following activities:  Hearing? 0  Vision? 0  Difficulty concentrating or making decisions? 0  Walking or climbing stairs? 1  Dressing or bathing? 0  Doing errands, shopping? 0  Preparing Food and eating ? N  Using the Toilet? N  In the  past six months, have you accidently leaked urine? N  Do you have problems with loss of bowel control? N  Managing your Medications? N  Managing your Finances? N  Housekeeping or managing your Housekeeping? N    Patient Care Team: Excell Seltzer, MD as PCP -  General Marykay Lex, MD as PCP - Cardiology (Cardiology) Blair Promise, OD as Consulting Physician (Optometry) Tyrone Schimke, MD as Consulting Physician (Ophthalmology) Bud Face, MD as Referring Physician (Otolaryngology) Waymon Budge, MD as Consulting Physician (Pulmonary Disease)  Indicate any recent Medical Services you may have received from other than [facility] providers in the past year (date may be approximate).     Assessment:   This is a routine wellness examination for [place].  Hearing/Vision screen Hearing Screening - Comments:: Pt says she has hearing aids Vision Screening - Comments:: Pt says she vision is great since cataract surgery Dr Dion Body   Goals Addressed             This Visit's Progress    Increase physical activity   Not on track    Starting 12/05/2017, I will continue to walk for 30 minutes 3 days per week.      Patient Stated   Not on track    12/11/2018, Patient states that she wants to increase her exercise in the future.      Patient Stated   Not on track    02/14/2020, I will continue to do physical therapy 2 days a week for 1 hour.        Depression Screen    01/18/2023    8:59 AM 12/06/2022    9:03 AM 03/31/2022   10:29 AM 10/19/2021    2:29 PM 02/14/2020   11:16 AM 01/09/2020   11:18 AM 08/13/2019    3:08 PM  PHQ 2/9 Scores  PHQ - 2 Score 1 1 0 0 0 0 0  PHQ- 9 Score  4 3  0 8 5    Fall Risk    01/18/2023    8:50 AM 12/06/2022    9:03 AM 03/31/2022   10:29 AM 10/19/2021    2:28 PM 02/14/2020   11:16 AM  Fall Risk   Falls in the past year? 1 0 0 0 0  Comment slick socks:last week      Number falls in past yr: 0 0 0 0 0  Injury with Fall? 0 1 0 0 0   Risk for fall due to : No Fall Risks History of fall(s) No Fall Risks No Fall Risks Medication side effect  Follow up Education provided;Falls prevention discussed Falls evaluation completed Falls evaluation completed  Falls evaluation completed;Falls prevention discussed    MEDICARE RISK AT HOME: Medicare Risk at Home Any stairs in or around the home?: No If so, are there any without handrails?: No Home free of loose throw rugs in walkways, pet beds, electrical cords, etc?: Yes Adequate lighting in your home to reduce risk of falls?: Yes Life alert?: No Use of a cane, walker or w/c?: No Grab bars in the bathroom?: No Shower chair or bench in shower?: Yes Elevated toilet seat or a handicapped toilet?: No  TIMED UP AND GO:  Was the test performed?  Yes  Length of time to ambulate 10 feet: 12 sec Gait steady and fast without use of assistive device    Cognitive Function:    02/14/2020   11:19 AM 12/11/2018    9:08 AM 12/05/2017   10:12 AM 11/24/2016   10:00 AM 11/24/2015   11:43 AM  MMSE - Mini Mental State Exam  Orientation to time 5 5 5 5 5   Orientation to Place 5 5 5 5 5   Registration 3 3 3 3 3   Attention/ Calculation 5 5 0 0 0  Recall 3 3 3 3 3   Language- name 2 objects   0 0 0  Language- repeat 1 1 1 1 1   Language- follow 3 step command   3 3 3   Language- read & follow direction   0 0 0  Write a sentence   0 0 0  Copy design   0 0 0  Total score   20 20 20         01/18/2023    9:10 AM  6CIT Screen  What Year? 0 points  What month? 0 points  What time? 0 points  Count back from 20 0 points  Months in reverse 0 points  Repeat phrase 0 points  Total Score 0 points    Immunizations Immunization History  Administered Date(s) Administered   Fluad Quad(high Dose 65+) 12/25/2018, 02/18/2020   Fluad Trivalent(High Dose 65+) 01/18/2023   Influenza, High Dose Seasonal PF 12/05/2017   Influenza,inj,Quad PF,6+ Mos 12/22/2015, 11/24/2016, 10/19/2021    PFIZER(Purple Top)SARS-COV-2 Vaccination 04/21/2019, 05/14/2019, 11/29/2019   Pneumococcal Conjugate-13 12/22/2015   Pneumococcal Polysaccharide-23 06/02/2011   Td 06/02/2011    TDAP status: Up to date  Flu Vaccine status: Completed at today's visit  Pneumococcal vaccine status: Up to date  Covid-19 vaccine status: Completed vaccines  Qualifies for Shingles Vaccine? Yes   Zostavax completed No   Shingrix Completed?: No.    Education has been provided regarding the importance of this vaccine. Patient has been advised to call insurance company to determine out of pocket expense if they have not yet received this vaccine. Advised may also receive vaccine at local pharmacy or Health Dept. Verbalized acceptance and understanding.  Screening Tests Health Maintenance  Topic Date Due   COVID-19 Vaccine (4 - 2023-24 season) 02/03/2023 (Originally 10/30/2022)   Zoster Vaccines- Shingrix (1 of 2) 04/20/2023 (Originally 07/30/1964)   DTaP/Tdap/Td (2 - Tdap) 01/18/2024 (Originally 06/01/2021)   DEXA SCAN  01/18/2024 (Originally 01/03/2021)   MAMMOGRAM  05/10/2023   Medicare Annual Wellness (AWV)  01/18/2024   Pneumonia Vaccine 27+ Years old  Completed   INFLUENZA VACCINE  Completed   Hepatitis C Screening  Completed   HPV VACCINES  Aged Out   Colonoscopy  Discontinued   Fecal DNA (Cologuard)  Discontinued    Health Maintenance  There are no preventive care reminders to display for this patient.   Colorectal cancer screening: No longer required.   Mammogram status: No longer required due to age.  Bone Density status: Completed 01/04/2016. PT DECLINES   Lung Cancer Screening: (Low Dose CT Chest recommended if Age 30-80 years, 20 pack-year currently smoking OR have quit w/in 15years.) does not qualify.   Lung Cancer Screening Referral: no  Additional Screening:  Hepatitis C Screening: does not qualify; Completed 11/24/2015  Vision Screening: Recommended annual ophthalmology exams for  early detection of glaucoma and other disorders of the eye. Is the patient up to date with their annual eye exam?  Yes  Who is the provider or what is the name of the office in which the patient attends annual eye exams? Dr Dion Body If pt is not established with a provider, would they like to be referred to a provider to establish care? No .   Dental Screening: Recommended annual dental exams for proper oral hygiene  Diabetic Foot Exam: n/a  Community Resource Referral / Chronic Care Management: CRR required this visit?  No   CCM required this visit?  No    Plan:     I have  personally reviewed and noted the following in the patient's chart:   Medical and social history Use of alcohol, tobacco or illicit drugs  Current medications and supplements including opioid prescriptions. Patient is not currently taking opioid prescriptions. Functional ability and status Nutritional status Physical activity Advanced directives List of other physicians Hospitalizations, surgeries, and ER visits in previous 12 months Vitals Screenings to include cognitive, depression, and falls Referrals and appointments  In addition, I have reviewed and discussed with patient certain preventive protocols, quality metrics, and best practice recommendations. A written personalized care plan for preventive services as well as general preventive health recommendations were provided to patient.    Sue Lush, LPN   19/14/7829   After Visit Summary: (MyChart) Due to this being a telephonic visit, the after visit summary with patients personalized plan was offered to patient via MyChart   Nurse Notes: The patient states she is are doing well and has no concerns or questions at this time.  *Influenza vaccine given at this visit

## 2023-01-19 ENCOUNTER — Telehealth: Payer: Self-pay | Admitting: *Deleted

## 2023-01-19 DIAGNOSIS — E559 Vitamin D deficiency, unspecified: Secondary | ICD-10-CM

## 2023-01-19 DIAGNOSIS — E785 Hyperlipidemia, unspecified: Secondary | ICD-10-CM

## 2023-01-19 DIAGNOSIS — R7303 Prediabetes: Secondary | ICD-10-CM

## 2023-01-19 NOTE — Telephone Encounter (Signed)
-----   Message from Alvina Chou sent at 01/19/2023 12:50 PM EST ----- Regarding: Lab orders for [facility], 12.5.24 Patient is scheduled for CPX labs, please order future labs, Thanks , Camelia Eng

## 2023-01-20 NOTE — Progress Notes (Signed)
Visit was done in person not virtual on 01/18/23

## 2023-01-20 NOTE — Progress Notes (Signed)
   Subjective:   Hannah Green is a 77 y.o. female who presents for Medicare Annual (Subsequent) preventive examination.  Visit Complete: In person on 01/18/23  Patient Location: Other:  office  Provider Location: Office/Clinic

## 2023-02-02 ENCOUNTER — Other Ambulatory Visit: Payer: Medicare HMO

## 2023-02-02 DIAGNOSIS — R7303 Prediabetes: Secondary | ICD-10-CM | POA: Diagnosis not present

## 2023-02-02 DIAGNOSIS — E559 Vitamin D deficiency, unspecified: Secondary | ICD-10-CM | POA: Diagnosis not present

## 2023-02-02 DIAGNOSIS — E785 Hyperlipidemia, unspecified: Secondary | ICD-10-CM | POA: Diagnosis not present

## 2023-02-02 LAB — COMPREHENSIVE METABOLIC PANEL
ALT: 16 U/L (ref 0–35)
AST: 18 U/L (ref 0–37)
Albumin: 3.9 g/dL (ref 3.5–5.2)
Alkaline Phosphatase: 68 U/L (ref 39–117)
BUN: 16 mg/dL (ref 6–23)
CO2: 30 meq/L (ref 19–32)
Calcium: 9.1 mg/dL (ref 8.4–10.5)
Chloride: 105 meq/L (ref 96–112)
Creatinine, Ser: 0.67 mg/dL (ref 0.40–1.20)
GFR: 84.25 mL/min (ref >60.00)
Glucose, Bld: 105 mg/dL — ABNORMAL HIGH (ref 70–99)
Potassium: 4.3 meq/L (ref 3.5–5.1)
Sodium: 142 meq/L (ref 135–145)
Total Bilirubin: 0.7 mg/dL (ref 0.2–1.2)
Total Protein: 6.4 g/dL (ref 6.0–8.3)

## 2023-02-02 LAB — VITAMIN D 25 HYDROXY (VIT D DEFICIENCY, FRACTURES): VITD: 37.95 ng/mL (ref 30.00–100.00)

## 2023-02-02 LAB — LIPID PANEL
Cholesterol: 175 mg/dL (ref 0–200)
HDL: 62.8 mg/dL (ref >39.00)
LDL Cholesterol: 93 mg/dL (ref 0–99)
NonHDL: 112.11
Total CHOL/HDL Ratio: 3
Triglycerides: 97 mg/dL (ref 0.0–149.0)
VLDL: 19.4 mg/dL (ref 0.0–40.0)

## 2023-02-02 LAB — HEMOGLOBIN A1C: Hgb A1c MFr Bld: 5.8 % (ref 4.6–6.5)

## 2023-02-02 NOTE — Progress Notes (Signed)
No critical labs need to be addressed urgently. We will discuss labs in detail at upcoming office visit.   

## 2023-02-09 ENCOUNTER — Ambulatory Visit (INDEPENDENT_AMBULATORY_CARE_PROVIDER_SITE_OTHER): Payer: Medicare HMO | Admitting: Family Medicine

## 2023-02-09 ENCOUNTER — Encounter: Payer: Self-pay | Admitting: Family Medicine

## 2023-02-09 VITALS — BP 128/66 | HR 72 | Temp 98.0°F | Ht 65.0 in | Wt 228.4 lb

## 2023-02-09 DIAGNOSIS — Z0000 Encounter for general adult medical examination without abnormal findings: Secondary | ICD-10-CM | POA: Diagnosis not present

## 2023-02-09 DIAGNOSIS — J449 Chronic obstructive pulmonary disease, unspecified: Secondary | ICD-10-CM

## 2023-02-09 DIAGNOSIS — E66812 Obesity, class 2: Secondary | ICD-10-CM

## 2023-02-09 DIAGNOSIS — I5189 Other ill-defined heart diseases: Secondary | ICD-10-CM

## 2023-02-09 DIAGNOSIS — M8580 Other specified disorders of bone density and structure, unspecified site: Secondary | ICD-10-CM

## 2023-02-09 DIAGNOSIS — Z6836 Body mass index (BMI) 36.0-36.9, adult: Secondary | ICD-10-CM

## 2023-02-09 DIAGNOSIS — E559 Vitamin D deficiency, unspecified: Secondary | ICD-10-CM

## 2023-02-09 DIAGNOSIS — R7303 Prediabetes: Secondary | ICD-10-CM

## 2023-02-09 DIAGNOSIS — E785 Hyperlipidemia, unspecified: Secondary | ICD-10-CM | POA: Diagnosis not present

## 2023-02-09 DIAGNOSIS — Z85118 Personal history of other malignant neoplasm of bronchus and lung: Secondary | ICD-10-CM

## 2023-02-09 DIAGNOSIS — M069 Rheumatoid arthritis, unspecified: Secondary | ICD-10-CM

## 2023-02-09 MED ORDER — TRELEGY ELLIPTA 100-62.5-25 MCG/ACT IN AEPB: 1.0000 | INHALATION_SPRAY | Freq: Every day | RESPIRATORY_TRACT | 11 refills | Status: DC

## 2023-02-09 MED ORDER — ALBUTEROL SULFATE HFA 108 (90 BASE) MCG/ACT IN AERS: 2.0000 | INHALATION_SPRAY | Freq: Four times a day (QID) | RESPIRATORY_TRACT | 2 refills | Status: DC | PRN

## 2023-02-09 MED ORDER — D3-50 1.25 MG (50000 UT) PO CAPS: 50000.0000 [IU] | ORAL_CAPSULE | ORAL | 3 refills | Status: AC

## 2023-02-09 NOTE — Assessment & Plan Note (Signed)
Chronic, She has lost 6lbs in the last 2 months   Was unable to take phentermine 15 mg daily... caused.  Palpitations and heart racing.  GLp1  not covered.   Will place  referral to [facility].

## 2023-02-09 NOTE — Assessment & Plan Note (Signed)
Status postresection, oncologist is Dr. Tanja Port.  1

## 2023-02-09 NOTE — Patient Instructions (Signed)
Please call the location of your choice from the menu below to schedule your Mammogram and/or Bone Density appointment.    [facility]                      Phone:  [phone], [place] [address]                                                             Services: Traditional and 3D Mammogram, [place] Bone Density                 Phone: [phone] [address], [place] [address]    Service: Bone Density ONLY   *this site does NOT perform mammograms  [facility]                        Phone:  [phone] [address], [place] [address]                                            Services:  3D Mammogram and [place] at [facility]   Phone:  [phone]   [place], [place] [address]                                            Services: 3D Mammogram and [place]  [place] at [facility])  Phone:  [phone]   [address] [place],  [address]  Services:  3D Mammogram and Bone Density

## 2023-02-09 NOTE — Progress Notes (Signed)
Patient ID: Hannah Green, female    DOB: 1946-01-12, 77 y.o.   MRN: [id number]  This visit was conducted in person.  BP 128/66   Pulse 72   Temp 98 F (36.7 C)   Ht 5\' 5"  (1.651 m)   Wt 228 lb 6 oz (103.6 kg)   LMP  (LMP Unknown)   SpO2 97%   BMI 38.00 kg/m    CC:  Chief Complaint  Patient presents with   Annual Exam    MCR prt 2 [AWV- 01/18/23].    Subjective:   HPI: Hannah Green is a 77 y.o. female presenting on 02/09/2023 for Annual Exam (MCR prt 2 [AWV- 01/18/23].)  The patient presents for  complete physical and review of chronic health problems. He/She also has the following acute concerns today: none  The patient saw a LPN or RN for medicare wellness visit.  January 18, 2023  Prevention and wellness was reviewed in detail. Note reviewed and important notes copied below.  Vit D def: Resolved on weekly vitamin D.    Elevated Cholesterol:  LDL at goal,  no longer  on atorvastatin 10 mg daily, no SE. Lab Results  Component Value Date   CHOL 175 02/02/2023   HDL 62.80 02/02/2023   LDLCALC 93 02/02/2023   LDLDIRECT 136.2 06/03/2011   TRIG 97.0 02/02/2023   CHOLHDL 3 02/02/2023   The 10-year ASCVD risk score (Arnett DK, et al., 2019) is: 19.9%   Values used to calculate the score:     Age: 11 years     Sex: Female     Is Non-Hispanic African American: No     Diabetic: No     Tobacco smoker: No     Systolic Blood Pressure: 128 mmHg     Is BP treated: No     HDL Cholesterol: 62.8 mg/dL     Total Cholesterol: 175 mg/dL Using medications without problems: Muscle aches:  none Diet compliance: moderate Exercise: silver sneaker daily. Other complaints:   Occ swelling off and on  Has not been able to tolerate phentermine. Weight loss of 6 pounds Wt Readings from Last 3 Encounters:  02/09/23 228 lb 6 oz (103.6 kg)  01/18/23 234 lb 3.2 oz (106.2 kg)  12/06/22 231 lb (104.8 kg)    RA:  Stable control on no medication.  HX of lung cancer, s/p   resection, Onc Dr. Tanja Port   COPD:   On  Trelegy .  Followed by pulmonary  Dr. Tonia Brooms. Using  [facility]  for rescue for some reason... needs albuterol prn.    Prediabetes  Lab Results  Component Value Date   HGBA1C 5.8 02/02/2023    Patient Care Team: Excell Seltzer, MD as PCP - General Marykay Lex, MD as PCP - Cardiology (Cardiology) Blair Promise, OD as Consulting Physician (Optometry) Tyrone Schimke, MD as Consulting Physician (Ophthalmology) Bud Face, MD as Referring Physician (Otolaryngology) Waymon Budge, MD as Consulting Physician (Pulmonary Disease)   Relevant past medical, surgical, family and social history reviewed and updated as indicated. Interim medical history since our last visit reviewed. Allergies and medications reviewed and updated. Outpatient Medications Prior to Visit  Medication Sig Dispense Refill   atorvastatin (LIPITOR) 10 MG tablet TAKE 1 TABLET BY MOUTH EVERY DAY 100 tablet 0   Ivermectin 1 % CREA Apply 1 Application topically as directed. Apply to face qd to bid  for Rosacea 30 g 11   minoxidil (LONITEN) 2.5 MG tablet  TAKE 1 TABLET BY MOUTH EVERY DAY 90 tablet 0   omeprazole (PRILOSEC) 40 MG capsule Take 40 mg by mouth daily.     Budeson-Glycopyrrol-Formoterol (BREZTRI AEROSPHERE) 160-9-4.8 MCG/ACT AERO Inhale 2 puffs into the lungs daily as needed. 10.7 g 11   D3-50 1.25 MG (50000 UT) capsule TAKE 1 CAPSULE BY MOUTH ONE TIME PER WEEK 12 capsule 3   Fluticasone-Umeclidin-Vilant (TRELEGY ELLIPTA) 100-62.5-25 MCG/ACT AEPB Inhale 1 puff into the lungs daily. 1 each 11   phentermine 15 MG capsule Take 1 capsule (15 mg total) by mouth every morning. Cancel prescription for mounjaro as not covered 30 capsule 0   No facility-administered medications prior to visit.     Per HPI unless specifically indicated in ROS section below Review of Systems  Constitutional:  Negative for fatigue and fever.  HENT:  Negative for congestion.    Eyes:  Negative for pain.  Respiratory:  Positive for shortness of breath. Negative for cough.   Cardiovascular:  Negative for chest pain, palpitations and leg swelling.  Gastrointestinal:  Negative for abdominal pain.  Genitourinary:  Negative for dysuria and vaginal bleeding.  Musculoskeletal:  Negative for back pain.  Neurological:  Negative for syncope, light-headedness and headaches.  Psychiatric/Behavioral:  Negative for dysphoric mood.    Objective:  BP 128/66   Pulse 72   Temp 98 F (36.7 C)   Ht 5\' 5"  (1.651 m)   Wt 228 lb 6 oz (103.6 kg)   LMP  (LMP Unknown)   SpO2 97%   BMI 38.00 kg/m   Wt Readings from Last 3 Encounters:  02/09/23 228 lb 6 oz (103.6 kg)  01/18/23 234 lb 3.2 oz (106.2 kg)  12/06/22 231 lb (104.8 kg)      Physical Exam Constitutional:      General: She is not in acute distress.    Appearance: Normal appearance. She is well-developed. She is not ill-appearing or toxic-appearing.  HENT:     Head: Normocephalic.     Right Ear: Hearing, tympanic membrane, ear canal and external ear normal. Tympanic membrane is not erythematous, retracted or bulging.     Left Ear: Hearing, tympanic membrane, ear canal and external ear normal. Tympanic membrane is not erythematous, retracted or bulging.     Nose: No mucosal edema or rhinorrhea.     Right Sinus: No maxillary sinus tenderness or frontal sinus tenderness.     Left Sinus: No maxillary sinus tenderness or frontal sinus tenderness.     Mouth/Throat:     Pharynx: Uvula midline.  Eyes:     General: Lids are normal. Lids are everted, no foreign bodies appreciated.     Conjunctiva/sclera: Conjunctivae normal.     Pupils: Pupils are equal, round, and reactive to light.  Neck:     Thyroid: No thyroid mass or thyromegaly.     Vascular: No carotid bruit.     Trachea: Trachea normal.  Cardiovascular:     Rate and Rhythm: Normal rate and regular rhythm.     Pulses: Normal pulses.     Heart sounds: Normal heart  sounds, S1 normal and S2 normal. No murmur heard.    No friction rub. No gallop.  Pulmonary:     Effort: Pulmonary effort is normal. No tachypnea or respiratory distress.     Breath sounds: Normal breath sounds. No decreased breath sounds, wheezing, rhonchi or rales.  Abdominal:     General: Bowel sounds are normal.     Palpations: Abdomen is soft.  Tenderness: There is no abdominal tenderness.  Musculoskeletal:     Cervical back: Normal range of motion and neck supple.  Skin:    General: Skin is warm and dry.     Findings: No rash.  Neurological:     Mental Status: She is alert.  Psychiatric:        Mood and Affect: Mood is not anxious or depressed.        Speech: Speech normal.        Behavior: Behavior normal. Behavior is cooperative.        Thought Content: Thought content normal.        Judgment: Judgment normal.       Results for orders placed or performed in visit on 02/02/23  VITAMIN D 25 Hydroxy (Vit-D Deficiency, Fractures)   Collection Time: 02/02/23  8:24 AM  Result Value Ref Range   VITD 37.95 30.00 - 100.00 ng/mL  Lipid panel   Collection Time: 02/02/23  8:24 AM  Result Value Ref Range   Cholesterol 175 0 - 200 mg/dL   Triglycerides 78.2 0.0 - 149.0 mg/dL   HDL 95.62 >13.08 mg/dL   VLDL 65.7 0.0 - 84.6 mg/dL   LDL Cholesterol 93 0 - 99 mg/dL   Total CHOL/HDL Ratio 3    NonHDL 112.11   Hemoglobin A1c   Collection Time: 02/02/23  8:24 AM  Result Value Ref Range   Hgb A1c MFr Bld 5.8 4.6 - 6.5 %  Comprehensive metabolic panel   Collection Time: 02/02/23  8:24 AM  Result Value Ref Range   Sodium 142 135 - 145 mEq/L   Potassium 4.3 3.5 - 5.1 mEq/L   Chloride 105 96 - 112 mEq/L   CO2 30 19 - 32 mEq/L   Glucose, Bld 105 (H) 70 - 99 mg/dL   BUN 16 6 - 23 mg/dL   Creatinine, Ser 9.62 0.40 - 1.20 mg/dL   Total Bilirubin 0.7 0.2 - 1.2 mg/dL   Alkaline Phosphatase 68 39 - 117 U/L   AST 18 0 - 37 U/L   ALT 16 0 - 35 U/L   Total Protein 6.4 6.0 - 8.3 g/dL    Albumin 3.9 3.5 - 5.2 g/dL   GFR 95.28 >41.32 mL/min   Calcium 9.1 8.4 - 10.5 mg/dL     COVID 19 screen:  No recent travel or known exposure to COVID19 The patient denies respiratory symptoms of COVID 19 at this time. The importance of social distancing was discussed today.   Assessment and Plan   The patient's preventative maintenance and recommended screening tests for an annual wellness exam were reviewed in full today. Brought up to date unless services declined.  Counselled on the importance of diet, exercise, and its role in overall health and mortality. The patient's FH and SH was reviewed, including their home life, tobacco status, and drug and alcohol status.    Vaccines:  PCV 13, 23 uptodate.  COVID x3.  Due for shingles and tdap ...will consider. Mammo:  04/2022 Hx of breast CA, Last pap age 61, no further indicated,  No DVE , low risk, no family history of ovarian or uterine cancer. DEXA: 12/2015,  plan  04/2023 Colon: Last 02/2010 nml,  PER  Dr. Kenna Gilbert last note: 11/2022 negative , no further indicated colonoscopy Hep C: neg Former smoker, >40 years:  Hx of lung cancer  spirometry 2020 severe COPD followed by pulmonary, Dr. Maple Hudson  Problem List Items Addressed This Visit  Class 2 severe obesity due to excess calories with serious comorbidity and body mass index (BMI) of 36.0 to 36.9 in adult [facility])   Chronic, She has lost 6lbs in the last 2 months   Was unable to take phentermine 15 mg daily... caused.  Palpitations and heart racing.  GLp1  not covered.   Will place  [facility]erral [facility].      Relevant Orders   Amb Ref to Medical Weight Management   COPD (chronic obstructive pulmonary disease) (HCC) (Chronic)   Chronic, stable control on Trelegy Ellipta 1 puff daily Followed by pulmonary.  Stop Breztri and go back to  albuterol prn.      Relevant Medications   Fluticasone-Umeclidin-Vilant (TRELEGY ELLIPTA) 100-62.5-25 MCG/ACT AEPB    albuterol (VENTOLIN HFA) 108 (90 Base) MCG/ACT inhaler   Diastolic dysfunction without heart failure (Chronic)   Euvolemic and followed by cardiology.  BP well controlled.      History of lung cancer   Status postresection, oncologist is Dr. Tanja Port.  1      Hyperlipidemia with target LDL less than 100 (Chronic)   Chronic, LDL  at goal     atorvastatin 10 mg p.o. daily.  Working on low-cholesterol diet, regular exercise and weight loss.      Prediabetes (Chronic)   Chronic, stable control. Encouraged exercise, weight loss, healthy eating habits.       Rheumatoid arthritis (HCC) (Chronic)   Chronic, stable control on no medication      Vitamin D deficiency (Chronic)   Chronic, resolved on weekly high-dose vitamin D supplementation.  She wishes to continue this weekly for now.      Other Visit Diagnoses       Routine general medical examination at a health care facility    -  Primary     Osteopenia, unspecified location       Relevant Orders   DG Bone Density         Kerby Nora, MD

## 2023-02-09 NOTE — Assessment & Plan Note (Addendum)
Chronic, stable control on Trelegy Ellipta 1 puff daily Followed by pulmonary.  Stop Breztri and go back to  albuterol prn.

## 2023-02-09 NOTE — Assessment & Plan Note (Addendum)
Chronic, stable control  Encouraged exercise, weight loss, healthy eating habits.

## 2023-02-09 NOTE — Assessment & Plan Note (Signed)
Chronic, stable control on no medication 

## 2023-02-09 NOTE — Assessment & Plan Note (Signed)
Chronic, LDL  at goal     atorvastatin 10 mg p.o. daily.  Working on low-cholesterol diet, regular exercise and weight loss.

## 2023-02-09 NOTE — Assessment & Plan Note (Signed)
Chronic, resolved on weekly high-dose vitamin D supplementation.  She wishes to continue this weekly for now.

## 2023-02-09 NOTE — Assessment & Plan Note (Signed)
Euvolemic and followed by cardiology.  BP well controlled.

## 2023-03-08 ENCOUNTER — Telehealth: Payer: Self-pay

## 2023-03-08 ENCOUNTER — Other Ambulatory Visit (HOSPITAL_COMMUNITY): Payer: Self-pay

## 2023-03-08 NOTE — Telephone Encounter (Signed)
 Pharmacy Patient Advocate Encounter   Received notification from CoverMyMeds that prior authorization for Mounjaro  2.5MG /0.5ML auto-injectors is required/requested.   Insurance verification completed.   The patient is insured through [employer] .   Per test claim: PA required; PA submitted to above mentioned insurance via CoverMyMeds Key/confirmation #/EOC [id number] Status is pending

## 2023-03-09 ENCOUNTER — Encounter: Payer: Self-pay | Admitting: *Deleted

## 2023-03-09 NOTE — Telephone Encounter (Signed)
 Patient notified of denial via MyChart.  FYI to Dr. Ermalene Searing.

## 2023-03-09 NOTE — Telephone Encounter (Signed)
 Pharmacy Patient Advocate Encounter  Received notification from [facility] that Prior Authorization for Mounjaro  2.5 pens has been DENIED.  See denial reason below. No denial letter attached in CMM. Will attach denial letter to Media tab once received.   PA #/Case ID/Reference #: [id number]

## 2023-04-08 ENCOUNTER — Other Ambulatory Visit: Payer: Self-pay | Admitting: Dermatology

## 2023-04-08 DIAGNOSIS — L650 Telogen effluvium: Secondary | ICD-10-CM

## 2023-04-18 ENCOUNTER — Other Ambulatory Visit: Payer: Self-pay | Admitting: Family Medicine

## 2023-05-08 ENCOUNTER — Encounter (INDEPENDENT_AMBULATORY_CARE_PROVIDER_SITE_OTHER): Payer: Medicare HMO | Admitting: Physician Assistant

## 2023-05-08 DIAGNOSIS — H903 Sensorineural hearing loss, bilateral: Secondary | ICD-10-CM | POA: Diagnosis not present

## 2023-05-08 DIAGNOSIS — H6982 Other specified disorders of Eustachian tube, left ear: Secondary | ICD-10-CM | POA: Diagnosis not present

## 2023-05-08 DIAGNOSIS — H6123 Impacted cerumen, bilateral: Secondary | ICD-10-CM | POA: Diagnosis not present

## 2023-06-19 ENCOUNTER — Encounter (INDEPENDENT_AMBULATORY_CARE_PROVIDER_SITE_OTHER): Admitting: Physician Assistant

## 2023-07-03 DIAGNOSIS — Z1231 Encounter for screening mammogram for malignant neoplasm of breast: Secondary | ICD-10-CM | POA: Diagnosis not present

## 2023-07-03 LAB — HM MAMMOGRAPHY

## 2023-07-13 ENCOUNTER — Encounter: Payer: Self-pay | Admitting: Dermatology

## 2023-07-13 ENCOUNTER — Ambulatory Visit: Payer: Medicare HMO | Admitting: Dermatology

## 2023-07-13 DIAGNOSIS — L738 Other specified follicular disorders: Secondary | ICD-10-CM | POA: Diagnosis not present

## 2023-07-13 DIAGNOSIS — Z85828 Personal history of other malignant neoplasm of skin: Secondary | ICD-10-CM | POA: Diagnosis not present

## 2023-07-13 DIAGNOSIS — Z79899 Other long term (current) drug therapy: Secondary | ICD-10-CM

## 2023-07-13 DIAGNOSIS — L814 Other melanin hyperpigmentation: Secondary | ICD-10-CM

## 2023-07-13 DIAGNOSIS — Z7189 Other specified counseling: Secondary | ICD-10-CM

## 2023-07-13 DIAGNOSIS — L820 Inflamed seborrheic keratosis: Secondary | ICD-10-CM | POA: Diagnosis not present

## 2023-07-13 DIAGNOSIS — L719 Rosacea, unspecified: Secondary | ICD-10-CM | POA: Diagnosis not present

## 2023-07-13 DIAGNOSIS — L578 Other skin changes due to chronic exposure to nonionizing radiation: Secondary | ICD-10-CM | POA: Diagnosis not present

## 2023-07-13 DIAGNOSIS — L720 Epidermal cyst: Secondary | ICD-10-CM | POA: Diagnosis not present

## 2023-07-13 DIAGNOSIS — W908XXA Exposure to other nonionizing radiation, initial encounter: Secondary | ICD-10-CM

## 2023-07-13 DIAGNOSIS — L650 Telogen effluvium: Secondary | ICD-10-CM | POA: Diagnosis not present

## 2023-07-13 DIAGNOSIS — L729 Follicular cyst of the skin and subcutaneous tissue, unspecified: Secondary | ICD-10-CM

## 2023-07-13 MED ORDER — DOXYCYCLINE HYCLATE 20 MG PO TABS: 20.0000 mg | ORAL_TABLET | Freq: Every day | ORAL | 11 refills | Status: DC

## 2023-07-13 MED ORDER — MINOXIDIL 2.5 MG PO TABS: 2.5000 mg | ORAL_TABLET | Freq: Every day | ORAL | 4 refills | Status: AC

## 2023-07-13 NOTE — Progress Notes (Signed)
 Follow-Up Visit   Subjective  Hannah Green is a 78 y.o. female who presents for the following: Telogen Effluvium, scalp 51yr f/u, Minoxidil  2.5mg  1 po qd, no s/e,  Rosacea face, Skin Medicinals Triple cream bid The patient has spots, moles and lesions to be evaluated, some may be new or changing and the patient may have concern these could be cancer.  The following portions of the chart were reviewed this encounter and updated as appropriate: medications, allergies, medical history  Review of Systems:  No other skin or systemic complaints except as noted in HPI or Assessment and Plan.  Objective  Well appearing patient in no apparent distress; mood and affect are within normal limits.  A focused examination was performed of the following areas: scalp  Relevant exam findings are noted in the Assessment and Plan.  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 Chronic and persistent condition with duration or expected duration over one year. Condition is improving with treatment but not currently at goal. Telogen effluvium is a benign, self-limited condition causing increased hair shedding usually for several months. It does not progress to baldness, and the hair eventually grows back on its own. It can be triggered by recent illness, recent surgery, thyroid  disease, low iron stores, vitamin D  deficiency, fad diets or rapid weight loss, hormonal changes such as pregnancy or birth control pills, and some medication. Usually the hair loss starts 2-3 months after the illness or health change. Rarely, it can continue for longer than a year. Treatments options may include oral or topical Minoxidil ; Red Light scalp treatments; Biotin 2.5 mg daily and other options.  Treatment Plan: Cont Minoxidil  2.5mg  1 po qd   Discussed pt discuss with Pulmonologist if she is a candidate for Dupixent  Doses of oral minoxidil  for hair loss are considered 'low dose'. This is because the doses used for hair loss are much lower than the doses which are used for conditions such as high blood pressure (hypertension). The doses used for hypertension are 10-40mg  per day.  Side effects are uncommon at the low doses (up to 2.5 mg/day) used to treat hair loss. 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 Condition is bothersome/symptomatic for patient. Currently flared. Rosacea is a chronic progressive skin condition usually affecting the face of adults, causing redness and/or acne bumps. It is treatable but not curable. It sometimes affects the eyes (ocular rosacea) as well. It may respond to topical and/or systemic medication and can flare with stress, sun exposure, alcohol, exercise, topical steroids (including hydrocortisone/cortisone 10) and some foods.  Daily application of broad spectrum spf 30+ sunscreen to face is recommended to reduce flares.  Patient denies grittiness of the eyes  Treatment Plan Start Doxycycline  20mg  1 po qd with food and drink D/c Skin  Medicinals Triple cream  Doxycycline  should be taken with food to prevent nausea. Do not lay down for 30 minutes after taking. Be cautious with sun exposure and use good sun protection while on this medication. Pregnant women should not take this medication.    EPIDERMAL INCLUSION CYST R medial infraorbital x 2 Exam: Subcutaneous nodule at R medial infraorbital x 2 Benign-appearing. Exam most consistent with an epidermal inclusion cyst. Discussed that a cyst is a benign growth that can grow over time and sometimes get irritated or inflamed. Recommend observation if it is not bothersome. Discussed option of surgical excision to remove it if it is growing, symptomatic, or other changes noted. Please call for new or changing lesions so they can be evaluated. Discussed referral to Dr. Paci for excision, pt prefers to observe for now  Sebaceous Hyperplasia face - Small yellow papules with a central dell - Benign-appearing - Observe. Call for changes.   HISTORY OF SKIN CANCER - Clear. Observe for recurrence.  - Call clinic for new or changing lesions.   - Recommend regular skin exams, daily broad-spectrum spf 30+ sunscreen use, and photoprotection.     - face, txted in past, clear today  ACTINIC DAMAGE - chronic, secondary to cumulative UV radiation exposure/sun exposure over time - diffuse scaly erythematous macules with underlying dyspigmentation - Recommend daily broad spectrum sunscreen SPF 30+ to sun-exposed areas, reapply every 2 hours as needed.  - Recommend staying in the shade or wearing long sleeves, sun glasses (UVA+UVB protection) and wide brim hats (4-inch brim around the entire circumference of the hat). - Call for new or changing lesions.  LENTIGINES Exam: scattered tan macules Due to sun exposure Treatment Plan: Benign-appearing, observe. Recommend daily broad spectrum sunscreen SPF 30+ to sun-exposed areas, reapply every 2 hours as needed.  Call for any changes  INFLAMED SEBORRHEIC KERATOSIS (3) L temple x 2, L cheek x 1 (3) Symptomatic, irritating, patient would like treated. Destruction of lesion - L temple x 2, L cheek x 1 (3) Complexity: simple   Destruction method: cryotherapy   Informed consent: discussed and  consent obtained   Timeout:  patient name, date of birth, surgical site, and procedure verified Lesion destroyed using liquid nitrogen: Yes   Region frozen until ice ball extended beyond lesion: Yes   Outcome: patient tolerated procedure well with no complications   Post-procedure details: wound care instructions given   TELOGEN EFFLUVIUM   Related Medications minoxidil  (LONITEN ) 2.5 MG tablet Take 1 tablet (2.5 mg total) by mouth daily. ACTINIC SKIN DAMAGE   LENTIGO   COUNSELING AND COORDINATION OF CARE   MEDICATION MANAGEMENT   ROSACEA   CYST OF SKIN   HX OF SKIN CANCER, BASAL CELL    Return in about 9 months (around 04/14/2024) for TBSE, Hx of Skin Ca.  I, Rollie Clipper, RMA, am acting as scribe for Celine Collard, MD .   Documentation: I have reviewed the above documentation for accuracy and completeness, and I agree with the above.  Celine Collard, MD

## 2023-07-13 NOTE — Patient Instructions (Addendum)

## 2023-07-14 ENCOUNTER — Telehealth: Payer: Self-pay | Admitting: Internal Medicine

## 2023-07-14 NOTE — Telephone Encounter (Signed)
 Left a voicemail with rescheduled appointment details. The patient will be mailed a reminder.

## 2023-07-17 ENCOUNTER — Encounter: Payer: Self-pay | Admitting: Dermatology

## 2023-08-07 ENCOUNTER — Other Ambulatory Visit: Payer: Self-pay

## 2023-08-07 MED ORDER — DOXYCYCLINE HYCLATE 20 MG PO TABS: 20.0000 mg | ORAL_TABLET | Freq: Every day | ORAL | 3 refills | Status: AC

## 2023-08-21 ENCOUNTER — Ambulatory Visit: Admitting: Cardiology

## 2023-08-24 ENCOUNTER — Ambulatory Visit
Admission: EM | Admit: 2023-08-24 | Discharge: 2023-08-24 | Disposition: A | Discharge status: Home / Self Care | Attending: Emergency Medicine | Admitting: Emergency Medicine

## 2023-08-24 ENCOUNTER — Encounter: Payer: Self-pay | Admitting: Emergency Medicine

## 2023-08-24 DIAGNOSIS — J441 Chronic obstructive pulmonary disease with (acute) exacerbation: Secondary | ICD-10-CM

## 2023-08-24 DIAGNOSIS — B029 Zoster without complications: Secondary | ICD-10-CM

## 2023-08-24 MED ORDER — AZITHROMYCIN 250 MG PO TABS
250.0000 mg | ORAL_TABLET | Freq: Every day | ORAL | 0 refills | Status: AC
Start: 2023-08-24 — End: ?

## 2023-08-24 MED ORDER — VALACYCLOVIR HCL 1 G PO TABS: 1000.0000 mg | ORAL_TABLET | Freq: Three times a day (TID) | ORAL | 0 refills | Status: AC

## 2023-08-24 NOTE — ED Triage Notes (Signed)
 Patient complains of cough with green sputum. Rash on left leg and right side. Also reports a rash around waist area that started this morning. Rates pain 7/10.

## 2023-08-24 NOTE — ED Provider Notes (Signed)
 Hannah Green    CSN: [id number] Arrival date & time: 08/24/23  1122      History   Chief Complaint Chief Complaint  Patient presents with   Rash   Cough    HPI Hannah Green is a 78 y.o. female.   Patient presents for evaluation of a possible insect bite to the left lower leg that occurred over the last 3 to 4 days, site is pruritic, denies drainage or pain, noticed a rash to the abdomen along the waistline beginning today, unsure if related.  Rash is also pruritic but denies drainage or pain.  Denies changes in soaps, toiletries, diet or recent travel.  No sick contact with similar symptoms   Patient presents for evaluation of a persisting cough for the past 7 days.  Worsening shortness of breath with exertion and wheezing from baseline.  History of COPD, using daily inhaler.  No known sick contacts.  Denies fever, ear pain, sore throat, headache or sinus pressure.  Has not attempted additional treatment thus or short Past Medical History:  Diagnosis Date   Breast cancer (HCC) 1999   Cataract 2017   resolved with surgery   COPD (chronic obstructive pulmonary disease) (HCC)    Coronary artery disease    Dysplastic nevus 09/04/2012   R prox calf - mild    GERD (gastroesophageal reflux disease)    Hyperlipidemia    Postoperative atrial fibrillation (HCC) 03/2018   Rheumatoid arthritis(714.0)    Skin cancer    face, txted in past   Squamous cell carcinoma of lung, stage I, right (HCC) 2020   Status post right upper and middle lobectomy    Patient Active Problem List   Diagnosis Date Noted   Class 2 severe obesity due to excess calories with serious comorbidity and body mass index (BMI) of 36.0 to 36.9 in adult (HCC) 12/06/2022   ETD (Eustachian tube dysfunction), bilateral 03/31/2022   Stage I squamous cell carcinoma of right lung (HCC) 06/12/2018   HCAP (healthcare-associated pneumonia) 05/08/2018   History of lung cancer 04/24/2018   S/P Right Upper  Lobectomy, Right Middle Lobectomy Lung 04/23/2018   Prediabetes 01/08/2018   COPD (chronic obstructive pulmonary disease) (HCC) 12/07/2016   Diastolic dysfunction without heart failure 02/01/2016   Osteopenia of femoral neck 01/12/2016   Rosacea 12/22/2015   Constipation, chronic 11/11/2014   Basal cell carcinoma of left side of nose 06/01/2012   GERD (gastroesophageal reflux disease)    Vitamin D  deficiency 02/13/2008   ADENOCARCINOMA, BREAST 02/08/2008   Hyperlipidemia with target LDL less than 100 02/08/2008   Rheumatoid arthritis (HCC) 02/08/2008    Past Surgical History:  Procedure Laterality Date   BREAST LUMPECTOMY  1999   left, needed radiation   CATARACT EXTRACTION W/ INTRAOCULAR LENS  IMPLANT, BILATERAL Bilateral 05/19/15, 06/02/15   CHEST TUBE INSERTION Right 04/23/2018   Procedure: Right Lateral Chest Tube Insertion;  Surgeon: Kerrin Elspeth BROCKS, MD;  Location: [facility] OR;  Service: Thoracic;  Laterality: Right;   CHOLECYSTECTOMY     COLONOSCOPY WITH PROPOFOL  N/A 12/02/2022   Procedure: COLONOSCOPY WITH PROPOFOL ;  Surgeon: Rollin Dover, MD;  Location: [facility];  Service: Gastroenterology;  Laterality: N/A;   ESOPHAGOGASTRODUODENOSCOPY (EGD) WITH PROPOFOL  N/A 06/24/2022   Procedure: ESOPHAGOGASTRODUODENOSCOPY (EGD) WITH PROPOFOL ;  Surgeon: Rollin Dover, MD;  Location: [facility];  Service: Gastroenterology;  Laterality: N/A;   LOBECTOMY Right 04/23/2018   Procedure: RIGHT UPPER LOBECTOMY;  Surgeon: Kerrin Elspeth BROCKS, MD;  Location: [facility] OR;  Service:  Thoracic;  Laterality: Right;   NM MYOVIEW  LTD  01/2016   LOW RISK. Hypertensive response to exercise followed by significant drop (stage I pressure 211/70 drop to 118/75. Stage II). Apical artifact but otherwise no ischemia or infarction. EF 64%.   NODE DISSECTION Right 04/23/2018   Procedure: MEDIASTINAL NODE DISSECTION;  Surgeon: Kerrin Elspeth BROCKS, MD;  Location: [facility] OR;  Service: Thoracic;  Laterality: Right;    POLYPECTOMY  12/02/2022   Procedure: POLYPECTOMY;  Surgeon: Rollin Dover, MD;  Location: [facility];  Service: Gastroenterology;;   RIGHT/LEFT HEART CATH AND CORONARY ANGIOGRAPHY N/A 04/10/2018   Procedure: RIGHT/LEFT HEART CATH AND CORONARY ANGIOGRAPHY;  Surgeon: Anner Alm ORN, MD;  Location: [facility];; angiographically minimal coronary artery disease.  Normal LVEDP.  Basely normal RHC pressures with no suggestion of pulmonary hypertension.  Normal CO-CI by Fick (5.9-2.8)   SAVORY DILATION N/A 06/24/2022   Procedure: SAVORY DILATION;  Surgeon: Rollin Dover, MD;  Location: [facility];  Service: Gastroenterology;  Laterality: N/A;   TONSILLECTOMY     TRANSTHORACIC ECHOCARDIOGRAM  11/'17; 2/'20   a) Nov 2017: Mild LVH. EF 60-65%. GR 2-DD; b) Feb 2020: Normal LV size and function.  EF 60 to 65%.  No R WMA.  GR 1 DD.  Normal RV size and function.  Normal pressures.  No valvular disease.   VIDEO ASSISTED THORACOSCOPY (VATS)/WEDGE RESECTION Right 04/23/2018   Procedure: VIDEO ASSISTED THORACOSCOPY (VATS)/WEDGE RESECTION OF RIGHT UPPER LUNG;  Surgeon: Kerrin Elspeth BROCKS, MD;  Location: [facility] OR;  Service: Thoracic;  Laterality: Right;    OB History   No obstetric history on file.      Home Medications    Prior to Admission medications   Medication Sig Start Date End Date Taking? Authorizing Provider  azithromycin  (ZITHROMAX ) 250 MG tablet Take 1 tablet (250 mg total) by mouth daily. Take first 2 tablets together, then 1 every day until finished. 08/24/23  Yes Quang Thorpe, Shelba SAUNDERS, NP  valACYclovir (VALTREX) 1000 MG tablet Take 1 tablet (1,000 mg total) by mouth 3 (three) times daily for 7 days. 08/24/23 08/31/23 Yes Olajuwon Fosdick, Shelba SAUNDERS, NP  albuterol  (VENTOLIN  HFA) 108 (90 Base) MCG/ACT inhaler Inhale 2 puffs into the lungs every 6 (six) hours as needed for wheezing or shortness of breath. 02/09/23   Bedsole, Amy E, MD  atorvastatin  (LIPITOR) 10 MG tablet TAKE 1 TABLET BY MOUTH EVERY DAY  04/18/23   Bedsole, Amy E, MD  Cholecalciferol (D3-50) 1.25 MG (50000 UT) capsule Take 1 capsule (50,000 Units total) by mouth once a week. 02/09/23   Bedsole, Amy E, MD  doxycycline  (PERIOSTAT ) 20 MG tablet Take 1 tablet (20 mg total) by mouth daily. 1 po qd with food and drink 08/07/23 09/06/23  Hester Alm BROCKS, MD  Fluticasone -Umeclidin-Vilant (TRELEGY ELLIPTA ) 100-62.5-25 MCG/ACT AEPB Inhale 1 puff into the lungs daily. 02/09/23   Bedsole, Amy E, MD  Ivermectin  1 % CREA Apply 1 Application topically as directed. Apply to face qd to bid  for Rosacea 07/12/22   Hester Alm BROCKS, MD  minoxidil  (LONITEN ) 2.5 MG tablet Take 1 tablet (2.5 mg total) by mouth daily. 07/13/23   Hester Alm BROCKS, MD  omeprazole  (PRILOSEC) 40 MG capsule Take 40 mg by mouth daily.    [provider]    Family History Family History  Problem Relation Age of Onset   Cancer Mother        breast   Cancer Father  lung cancer   Cancer Sister        throat cancer   Diabetes Paternal Uncle    Diabetes Maternal Grandmother     Social History Social History   Tobacco Use   Smoking status: Former    Current packs/day: 0.00    Average packs/day: 1.5 packs/day for 50.0 years (75.0 ttl pk-yrs)    Types: Cigarettes    Start date: 03/31/1961    Quit date: 04/01/2011    Years since quitting: 12.4   Smokeless tobacco: Never   Tobacco comments:    has stopped but has started again in crisis  Vaping Use   Vaping status: Never Used  Substance Use Topics   Alcohol use: Yes    Alcohol/week: 2.0 standard drinks of alcohol    Types: 2 Glasses of wine per week    Comment: monthly   Drug use: No     Allergies   Adhesive [tape], Neosporin [neomycin-bacitracin zn-polymyx], and Penicillins   Review of Systems Review of Systems  Respiratory:  Positive for cough.   Skin:  Positive for rash.     Physical Exam Triage Vital Signs ED Triage Vitals  Encounter Vitals Group     BP 08/24/23 1146 (!) 145/83      Girls Systolic BP Percentile --      Girls Diastolic BP Percentile --      Boys Systolic BP Percentile --      Boys Diastolic BP Percentile --      Pulse Rate 08/24/23 1146 71     Resp 08/24/23 1146 20     Temp 08/24/23 1146 98 F (36.7 C)     Temp Source 08/24/23 1146 Oral     SpO2 08/24/23 1146 98 %     Weight --      Height --      Head Circumference --      Peak Flow --      Pain Score 08/24/23 1149 7     Pain Loc --      Pain Education --      Exclude from Growth Chart --    No data found.  Updated Vital Signs BP (!) 145/83 (BP Location: Left Arm)   Pulse 71   Temp 98 F (36.7 C) (Oral)   Resp 20   LMP  (LMP Unknown)   SpO2 98%   Visual Acuity Right Eye Distance:   Left Eye Distance:   Bilateral Distance:    Right Eye Near:   Left Eye Near:    Bilateral Near:     Physical Exam Constitutional:      Appearance: Normal appearance.  HENT:     Nose: Congestion present.   Eyes:     Extraocular Movements: Extraocular movements intact.    Cardiovascular:     Rate and Rhythm: Normal rate and regular rhythm.     Pulses: Normal pulses.     Heart sounds: Normal heart sounds.  Pulmonary:     Effort: Pulmonary effort is normal.     Breath sounds: Normal breath sounds.   Neurological:     Mental Status: She is alert and oriented to person, place, and time. Mental status is at baseline.      UC Treatments / Results  Labs (all labs ordered are listed, but only abnormal results are displayed) Labs Reviewed - No data to display  EKG   Radiology No results found.  Procedures Procedures (including critical care time)  Medications Ordered in UC  Medications - No data to display  Initial Impression / Assessment and Plan / UC Course  I have reviewed the triage vital signs and the nursing notes.  Pertinent labs & imaging results that were available during my care of the patient were reviewed by me and considered in my medical decision making (see chart  for details).  Herpes zoster without complication, COPD exacerbation  Presentation of the rash to the abdomen is consistent with shingles, unrelated to area on the leg which is most consistent with unknown insect bite, localized reaction, discussed etiology and given written handout, prescribed valacyclovir and discussed administration, recommended supportive care for management of rash advised follow-up with primary doctor for any concerns, interested in vaccination, advised to discuss with PCP once symptoms have resolved  Lungs are clear to auscultation, O2 saturation 98% on room air, patient in no signs of distress nontoxic-appearing,, stable, moving forward with treatment of a COPD flare, prescribed azithromycin , declined use of steroids and cough suppressants, recommended over-the-counter medications and nonpharmacological measures and advised follow-up as needed Final Clinical Impressions(s) / UC Diagnoses   Final diagnoses:  COPD exacerbation (HCC)  Herpes zoster without complication     Discharge Instructions      For your cough -As symptoms have been present for 7 days and do not show signs that it is were resolving you will be placed on antibiotic -On exam your lungs are clear and you are getting enough air without assistance at this time I do not believe you to have pneumonia and I do believe that your COPD is flared as your shortness of breath and wheezing are also worse -Take azithromycin  as directed for bacterial coverage -You have declined steroids and cough medicine -For cough: honey 1/2 to 1 teaspoon (you can dilute the honey in water or another fluid).  You can also use guaifenesin  and dextromethorphan for cough. You can use a humidifier for chest congestion and cough.  If you don't have a humidifier, you can sit in the bathroom with the hot shower running.     -For sore throat: try warm salt water gargles, cepacol lozenges, throat spray, warm tea or water with lemon/honey,  popsicles or ice, or OTC cold relief medicine for throat discomfort.  -For congestion: take a daily anti-histamine like Zyrtec, Claritin, and a oral decongestant, such as pseudoephedrine.  You can also use Flonase  1-2 sprays in each nostril daily.  -It is important to stay hydrated: drink plenty of fluids (water, gatorade/powerade/pedialyte, juices, or teas) to keep your throat moisturized and help further relieve irritation/discomfort.   For your rash - Rash on the right side of the abdomen is consistent with shingles medically known as herpes zoster which is a viral rash, as it is caused by a virus it must resolve on its own -More information in your packet regarding shingles - Take valacyclovir every 8 hours for 7 days, this medicine suppresses the virus and reduces replication which helps symptoms to resolve faster -Shingles is spread by contact with the fluid from the rash therefore keep covered which you can do so by clothing and avoid touching and if you do so cleanse hands -Treatment focuses on keeping you comfortable - May use topical Benadryl  or calamine lotion over the affected area for itching, may also take allergy medicine such as Claritin or Zyrtec -May apply ice over the affected area as needed for general comfort -If your symptoms continue to persist or worsen please follow-up with your primary doctor -After rash has  cleared you may discuss vaccination with your primary doctor   ED Prescriptions     Medication Sig Dispense Auth. Provider   azithromycin  (ZITHROMAX ) 250 MG tablet Take 1 tablet (250 mg total) by mouth daily. Take first 2 tablets together, then 1 every day until finished. 6 tablet Cheo Selvey R, NP   valACYclovir (VALTREX) 1000 MG tablet Take 1 tablet (1,000 mg total) by mouth 3 (three) times daily for 7 days. 21 tablet Pretty Weltman R, NP      PDMP not reviewed this encounter.   Teresa Shelba SAUNDERS, NP 08/24/23 [phone]

## 2023-08-24 NOTE — Discharge Instructions (Addendum)
 For your cough -As symptoms have been present for 7 days and do not show signs that it is were resolving you will be placed on antibiotic -On exam your lungs are clear and you are getting enough air without assistance at this time I do not believe you to have pneumonia and I do believe that your COPD is flared as your shortness of breath and wheezing are also worse -Take azithromycin  as directed for bacterial coverage -You have declined steroids and cough medicine -For cough: honey 1/2 to 1 teaspoon (you can dilute the honey in water or another fluid).  You can also use guaifenesin  and dextromethorphan for cough. You can use a humidifier for chest congestion and cough.  If you don't have a humidifier, you can sit in the bathroom with the hot shower running.     -For sore throat: try warm salt water gargles, cepacol lozenges, throat spray, warm tea or water with lemon/honey, popsicles or ice, or OTC cold relief medicine for throat discomfort.  -For congestion: take a daily anti-histamine like Zyrtec, Claritin, and a oral decongestant, such as pseudoephedrine.  You can also use Flonase  1-2 sprays in each nostril daily.  -It is important to stay hydrated: drink plenty of fluids (water, gatorade/powerade/pedialyte, juices, or teas) to keep your throat moisturized and help further relieve irritation/discomfort.   For your rash - Rash on the right side of the abdomen is consistent with shingles medically known as herpes zoster which is a viral rash, as it is caused by a virus it must resolve on its own -More information in your packet regarding shingles - Take valacyclovir every 8 hours for 7 days, this medicine suppresses the virus and reduces replication which helps symptoms to resolve faster -Shingles is spread by contact with the fluid from the rash therefore keep covered which you can do so by clothing and avoid touching and if you do so cleanse hands -Treatment focuses on keeping you comfortable -  May use topical Benadryl  or calamine lotion over the affected area for itching, may also take allergy medicine such as Claritin or Zyrtec -May apply ice over the affected area as needed for general comfort -If your symptoms continue to persist or worsen please follow-up with your primary doctor -After rash has cleared you may discuss vaccination with your primary doctor

## 2023-10-10 ENCOUNTER — Other Ambulatory Visit: Payer: Medicare HMO

## 2023-10-11 ENCOUNTER — Inpatient Hospital Stay: Attending: Internal Medicine

## 2023-10-11 ENCOUNTER — Ambulatory Visit (HOSPITAL_COMMUNITY)
Admission: RE | Admit: 2023-10-11 | Discharge: 2023-10-11 | Disposition: A | Discharge status: Home / Self Care | Source: Ambulatory Visit | Attending: Internal Medicine | Admitting: Internal Medicine

## 2023-10-11 DIAGNOSIS — Z08 Encounter for follow-up examination after completed treatment for malignant neoplasm: Secondary | ICD-10-CM | POA: Insufficient documentation

## 2023-10-11 DIAGNOSIS — Z853 Personal history of malignant neoplasm of breast: Secondary | ICD-10-CM | POA: Insufficient documentation

## 2023-10-11 DIAGNOSIS — J432 Centrilobular emphysema: Secondary | ICD-10-CM | POA: Diagnosis not present

## 2023-10-11 DIAGNOSIS — Z902 Acquired absence of lung [part of]: Secondary | ICD-10-CM | POA: Diagnosis not present

## 2023-10-11 DIAGNOSIS — I700 Atherosclerosis of aorta: Secondary | ICD-10-CM | POA: Diagnosis not present

## 2023-10-11 DIAGNOSIS — Z85118 Personal history of other malignant neoplasm of bronchus and lung: Secondary | ICD-10-CM | POA: Diagnosis not present

## 2023-10-11 DIAGNOSIS — Z85828 Personal history of other malignant neoplasm of skin: Secondary | ICD-10-CM | POA: Insufficient documentation

## 2023-10-11 DIAGNOSIS — C3490 Malignant neoplasm of unspecified part of unspecified bronchus or lung: Secondary | ICD-10-CM | POA: Diagnosis not present

## 2023-10-11 LAB — CBC WITH DIFFERENTIAL (CANCER CENTER ONLY)
Abs Immature Granulocytes: 0.02 K/uL (ref 0.00–0.07)
Basophils Absolute: 0.1 K/uL (ref 0.0–0.1)
Basophils Relative: 1 %
Eosinophils Absolute: 0.1 K/uL (ref 0.0–0.5)
Eosinophils Relative: 2 %
HCT: 42.8 % (ref 36.0–46.0)
Hemoglobin: 14.5 g/dL (ref 12.0–15.0)
Immature Granulocytes: 0 %
Lymphocytes Relative: 32 %
Lymphs Abs: 2.6 K/uL (ref 0.7–4.0)
MCH: 31.1 pg (ref 26.0–34.0)
MCHC: 33.9 g/dL (ref 30.0–36.0)
MCV: 91.8 fL (ref 80.0–100.0)
Monocytes Absolute: 0.6 K/uL (ref 0.1–1.0)
Monocytes Relative: 7 %
Neutro Abs: 4.8 K/uL (ref 1.7–7.7)
Neutrophils Relative %: 58 %
Platelet Count: 325 K/uL (ref 150–400)
RBC: 4.66 MIL/uL (ref 3.87–5.11)
RDW: 13.4 % (ref 11.5–15.5)
WBC Count: 8.2 K/uL (ref 4.0–10.5)
nRBC: 0 % (ref 0.0–0.2)

## 2023-10-11 LAB — CMP (CANCER CENTER ONLY)
ALT: 16 U/L (ref 0–44)
AST: 15 U/L (ref 15–41)
Albumin: 4 g/dL (ref 3.5–5.0)
Alkaline Phosphatase: 72 U/L (ref 38–126)
Anion gap: 3 — ABNORMAL LOW (ref 5–15)
BUN: 14 mg/dL (ref 8–23)
CO2: 30 mmol/L (ref 22–32)
Calcium: 8.8 mg/dL — ABNORMAL LOW (ref 8.9–10.3)
Chloride: 107 mmol/L (ref 98–111)
Creatinine: 0.65 mg/dL (ref 0.44–1.00)
GFR, Estimated: >60 mL/min (ref >60)
Glucose, Bld: 106 mg/dL — ABNORMAL HIGH (ref 70–99)
Potassium: 4.6 mmol/L (ref 3.5–5.1)
Sodium: 140 mmol/L (ref 135–145)
Total Bilirubin: 0.5 mg/dL (ref 0.0–1.2)
Total Protein: 6.7 g/dL (ref 6.5–8.1)

## 2023-10-11 MED ORDER — SODIUM CHLORIDE (PF) 0.9 % IJ SOLN
INTRAMUSCULAR | Status: AC
  Filled 2023-10-11: qty 50

## 2023-10-11 MED ORDER — IOHEXOL 300 MG/ML  SOLN
75.0000 mL | Freq: Once | INTRAMUSCULAR | Status: AC | PRN
  Administered 2023-10-11 (×2): 75 mL via INTRAVENOUS

## 2023-10-14 ENCOUNTER — Other Ambulatory Visit: Payer: Self-pay | Admitting: Family Medicine

## 2023-10-17 ENCOUNTER — Ambulatory Visit: Payer: Medicare HMO | Admitting: Internal Medicine

## 2023-10-18 ENCOUNTER — Inpatient Hospital Stay: Admitting: Internal Medicine

## 2023-10-18 VITALS — BP 160/64 | HR 71 | Temp 97.2°F | Resp 17 | Ht 65.0 in | Wt 233.0 lb

## 2023-10-18 DIAGNOSIS — Z08 Encounter for follow-up examination after completed treatment for malignant neoplasm: Secondary | ICD-10-CM | POA: Diagnosis not present

## 2023-10-18 DIAGNOSIS — Z85828 Personal history of other malignant neoplasm of skin: Secondary | ICD-10-CM | POA: Diagnosis not present

## 2023-10-18 DIAGNOSIS — C3491 Malignant neoplasm of unspecified part of right bronchus or lung: Secondary | ICD-10-CM | POA: Diagnosis not present

## 2023-10-18 DIAGNOSIS — Z902 Acquired absence of lung [part of]: Secondary | ICD-10-CM | POA: Diagnosis not present

## 2023-10-18 DIAGNOSIS — Z853 Personal history of malignant neoplasm of breast: Secondary | ICD-10-CM | POA: Diagnosis not present

## 2023-10-18 DIAGNOSIS — Z85118 Personal history of other malignant neoplasm of bronchus and lung: Secondary | ICD-10-CM | POA: Diagnosis not present

## 2023-10-18 NOTE — Progress Notes (Signed)
 [facility] [phone]   [phone]  OFFICE PROGRESS NOTE  Hannah Green BRAVO, MD [address] [place] [address]  DIAGNOSIS: Stage IB (T2a, N0, M0) non-small cell lung cancer, squamous cell carcinoma presented with right upper lobe nodule  PRIOR THERAPY:  Status post right upper and right middle lobe bilobectomy as well as lymph node dissections under the care of Dr. Kerrin on April 23, 2018.  The tumor was 1.7 cm in size but there was evidence for visceropleural involvement.  CURRENT THERAPY: Observation.  INTERVAL HISTORY: Hannah Green 78 y.o. female returns to the clinic today for annual follow-up visit.Discussed the use of AI scribe software for clinical note transcription with the patient, who gave verbal consent to proceed.  History of Present Illness Hannah Green is a 78 year old female with stage one small cell lung cancer and squamous cell carcinoma who presents for evaluation with repeat CT scan of the chest for restaging of her disease.  She has been under observation since her lobectomies in February 2020. Previous scans have shown a stable 0.4 cm nodule in the left upper lobe.  She experiences ongoing breathing issues, particularly upon exertion. She uses a Trelegy inhaler but is facing difficulty obtaining it due to increased cost, with her copay rising from $40 to $166. She has explored various options to reduce the cost but has not found a solution.  No new health issues have arisen over the past year, including chest pain, nausea, or vomiting.    MEDICAL HISTORY: Past Medical History:  Diagnosis Date   Breast cancer [facility]) 1999   Cataract 2017   resolved with surgery   COPD (chronic obstructive pulmonary disease) (HCC)    Coronary artery disease    Dysplastic nevus 09/04/2012   R prox calf - mild    GERD (gastroesophageal reflux disease)    Hyperlipidemia    Postoperative atrial fibrillation  (HCC) 03/2018   Rheumatoid arthritis(714.0)    Skin cancer    face, txted in past   Squamous cell carcinoma of lung, stage I, right (HCC) 2020   Status post right upper and middle lobectomy    ALLERGIES:  is allergic to adhesive [tape], neosporin [neomycin-bacitracin zn-polymyx], and penicillins.  MEDICATIONS:  Current Outpatient Medications  Medication Sig Dispense Refill   albuterol  (VENTOLIN  HFA) 108 (90 Base) MCG/ACT inhaler Inhale 2 puffs into the lungs every 6 (six) hours as needed for wheezing or shortness of breath. 8 g 2   atorvastatin  (LIPITOR) 10 MG tablet TAKE 1 TABLET BY MOUTH EVERY DAY 100 tablet 2   azithromycin  (ZITHROMAX ) 250 MG tablet Take 1 tablet (250 mg total) by mouth daily. Take first 2 tablets together, then 1 every day until finished. 6 tablet 0   Cholecalciferol (D3-50) 1.25 MG (50000 UT) capsule Take 1 capsule (50,000 Units total) by mouth once a week. 12 capsule 3   Fluticasone -Umeclidin-Vilant (TRELEGY ELLIPTA ) 100-62.5-25 MCG/ACT AEPB Inhale 1 puff into the lungs daily. 1 each 11   Ivermectin  1 % CREA Apply 1 Application topically as directed. Apply to face qd to bid  for Rosacea 30 g 11   minoxidil  (LONITEN ) 2.5 MG tablet Take 1 tablet (2.5 mg total) by mouth daily. 90 tablet 4   omeprazole  (PRILOSEC) 40 MG capsule Take 40 mg by mouth daily.     No current facility-administered medications for this visit.    SURGICAL HISTORY:  Past Surgical History:  Procedure  Laterality Date   BREAST LUMPECTOMY  1999   left, needed radiation   CATARACT EXTRACTION W/ INTRAOCULAR LENS  IMPLANT, BILATERAL Bilateral 05/19/15, 06/02/15   CHEST TUBE INSERTION Right 04/23/2018   Procedure: Right Lateral Chest Tube Insertion;  Surgeon: Kerrin Elspeth BROCKS, MD;  Location: [facility] OR;  Service: Thoracic;  Laterality: Right;   CHOLECYSTECTOMY     COLONOSCOPY WITH PROPOFOL  N/A 12/02/2022   Procedure: COLONOSCOPY WITH PROPOFOL ;  Surgeon: Rollin Dover, MD;  Location: [facility];   Service: Gastroenterology;  Laterality: N/A;   ESOPHAGOGASTRODUODENOSCOPY (EGD) WITH PROPOFOL  N/A 06/24/2022   Procedure: ESOPHAGOGASTRODUODENOSCOPY (EGD) WITH PROPOFOL ;  Surgeon: Rollin Dover, MD;  Location: [facility];  Service: Gastroenterology;  Laterality: N/A;   LOBECTOMY Right 04/23/2018   Procedure: RIGHT UPPER LOBECTOMY;  Surgeon: Kerrin Elspeth BROCKS, MD;  Location: [facility] OR;  Service: Thoracic;  Laterality: Right;   NM MYOVIEW  LTD  01/2016   LOW RISK. Hypertensive response to exercise followed by significant drop (stage I pressure 211/70 drop to 118/75. Stage II). Apical artifact but otherwise no ischemia or infarction. EF 64%.   NODE DISSECTION Right 04/23/2018   Procedure: MEDIASTINAL NODE DISSECTION;  Surgeon: Kerrin Elspeth BROCKS, MD;  Location: [facility] OR;  Service: Thoracic;  Laterality: Right;   POLYPECTOMY  12/02/2022   Procedure: POLYPECTOMY;  Surgeon: Rollin Dover, MD;  Location: [facility];  Service: Gastroenterology;;   RIGHT/LEFT HEART CATH AND CORONARY ANGIOGRAPHY N/A 04/10/2018   Procedure: RIGHT/LEFT HEART CATH AND CORONARY ANGIOGRAPHY;  Surgeon: Anner Alm ORN, MD;  Location: [facility] LAB;; angiographically minimal coronary artery disease.  Normal LVEDP.  Basely normal RHC pressures with no suggestion of pulmonary hypertension.  Normal CO-CI by Fick (5.9-2.8)   SAVORY DILATION N/A 06/24/2022   Procedure: SAVORY DILATION;  Surgeon: Rollin Dover, MD;  Location: [facility];  Service: Gastroenterology;  Laterality: N/A;   TONSILLECTOMY     TRANSTHORACIC ECHOCARDIOGRAM  11/'17; 2/'20   a) Nov 2017: Mild LVH. EF 60-65%. GR 2-DD; b) Feb 2020: Normal LV size and function.  EF 60 to 65%.  No R WMA.  GR 1 DD.  Normal RV size and function.  Normal pressures.  No valvular disease.   VIDEO ASSISTED THORACOSCOPY (VATS)/WEDGE RESECTION Right 04/23/2018   Procedure: VIDEO ASSISTED THORACOSCOPY (VATS)/WEDGE RESECTION OF RIGHT UPPER LUNG;  Surgeon: Kerrin Elspeth BROCKS, MD;   Location: [facility] OR;  Service: Thoracic;  Laterality: Right;    REVIEW OF SYSTEMS:  A comprehensive review of systems was negative except for: Respiratory: positive for dyspnea on exertion   PHYSICAL EXAMINATION: General appearance: alert, cooperative, and no distress Head: Normocephalic, without obvious abnormality, atraumatic Neck: no adenopathy, no JVD, supple, symmetrical, trachea midline, and thyroid  not enlarged, symmetric, no tenderness/mass/nodules Lymph nodes: Cervical, supraclavicular, and axillary nodes normal. Resp: clear to auscultation bilaterally Back: symmetric, no curvature. ROM normal. No CVA tenderness. Cardio: regular rate and rhythm, S1, S2 normal, no murmur, click, rub or gallop GI: soft, non-tender; bowel sounds normal; no masses,  no organomegaly Extremities: extremities normal, atraumatic, no cyanosis or edema  ECOG PERFORMANCE STATUS: 1 - Symptomatic but completely ambulatory  Blood pressure (!) 160/64, pulse 71, temperature (!) 97.2 F (36.2 C), temperature source Temporal, resp. rate 17, height 5' 5 (1.651 m), weight 233 lb (105.7 kg), SpO2 97%.  LABORATORY DATA: Lab Results  Component Value Date   WBC 8.2 10/11/2023   HGB 14.5 10/11/2023   HCT 42.8 10/11/2023   MCV 91.8 10/11/2023   PLT 325 10/11/2023  Chemistry      Component Value Date/Time   NA 140 10/11/2023 0908   NA 142 09/20/2022 1000   K 4.6 10/11/2023 0908   CL 107 10/11/2023 0908   CO2 30 10/11/2023 0908   BUN 14 10/11/2023 0908   BUN 17 09/20/2022 1000   CREATININE 0.65 10/11/2023 0908   CREATININE 0.61 06/01/2012 1452      Component Value Date/Time   CALCIUM  8.8 (L) 10/11/2023 0908   ALKPHOS 72 10/11/2023 0908   AST 15 10/11/2023 0908   ALT 16 10/11/2023 0908   BILITOT 0.5 10/11/2023 0908       RADIOGRAPHIC STUDIES: CT Chest W Contrast Result Date: 10/14/2023 CLINICAL DATA:  Non-small-cell lung cancer restaging additional history of breast cancer * Tracking Code: BO *  EXAM: CT CHEST WITH CONTRAST TECHNIQUE: Multidetector CT imaging of the chest was performed during intravenous contrast administration. RADIATION DOSE REDUCTION: This exam was performed according to the departmental dose-optimization program which includes automated exposure control, adjustment of the mA and/or kV according to patient size and/or use of iterative reconstruction technique. CONTRAST:  75mL OMNIPAQUE  IOHEXOL  300 MG/ML  SOLN COMPARISON:  10/17/2022 FINDINGS: Cardiovascular: Aortic atherosclerosis. Normal heart size. Left coronary artery calcifications. No pericardial effusion. Mediastinum/Nodes: No enlarged mediastinal, hilar, or axillary lymph nodes. Small hiatal hernia. Thyroid  gland, trachea, and esophagus demonstrate no significant findings. Lungs/Pleura: Right upper and right middle lobectomy. Mild underlying centrilobular and paraseptal emphysema. Unchanged small pulmonary nodules, for example a 0.4 cm nodule of the posterior medial left upper lobe (series 8, image 61) no pleural effusion or pneumothorax. Upper Abdomen: No acute abnormality. Hepatic steatosis. Cholecystectomy. Musculoskeletal: Right axillary lymph node dissection. No acute osseous findings. IMPRESSION: 1. Right upper and right middle lobectomy. 2. Unchanged small pulmonary nodules, benign. 3. No evidence of lymphadenopathy or metastatic disease in the chest. 4. Emphysema. 5. Coronary artery disease. 6. Hepatic steatosis. Aortic Atherosclerosis (ICD10-I70.0) and Emphysema (ICD10-J43.9). Electronically Signed   By: Marolyn JONETTA Jaksch M.D.   On: 10/14/2023 17:27     ASSESSMENT AND PLAN: This is a very pleasant 78 years old white female with a stage Ib non-small cell lung cancer status post right upper and middle lobectomies with lymph node dissection in February 2020.   She has been on observation since that time and she is feeling fine. She had repeat CT scan of the chest performed recently.  I personally and independently reviewed  the scan and discussed the result with the patient today.  Her scan showed no concerning findings for disease recurrence or metastasis. Assessment and Plan Assessment & Plan History of stage I non-small cell lung cancer (squamous cell carcinoma), status post right upper and middle lobectomy Status post right upper and middle lobectomy with lymph node dissection in February 2020. She has been on observation since that time. Recent CT scan of the chest shows a 0.4 cm nodule in the left upper lobe with no lymphadenopathy or metastatic disease. She has surpassed the five-year mark since diagnosis, indicating a favorable prognosis. - Provide option to continue annual scans with oncologist or follow up with family doctor for annual scan or chest X-ray.  She preferred to follow-up with her primary care physician from now on. She was advised to call immediately if she has any other concerning symptoms in the interval. All questions were answered. The patient knows to call the clinic with any problems, questions or concerns. We can certainly see the patient much sooner if necessary.   Disclaimer: This note  was dictated with voice recognition software. Similar sounding words can inadvertently be transcribed and may not be corrected upon review.

## 2023-10-20 ENCOUNTER — Encounter: Payer: Self-pay | Admitting: Cardiology

## 2023-10-20 ENCOUNTER — Ambulatory Visit: Attending: Cardiology | Admitting: Cardiology

## 2023-10-20 VITALS — BP 132/60 | HR 62 | Ht 66.0 in | Wt 232.0 lb

## 2023-10-20 DIAGNOSIS — I480 Paroxysmal atrial fibrillation: Secondary | ICD-10-CM | POA: Diagnosis not present

## 2023-10-20 DIAGNOSIS — R0609 Other forms of dyspnea: Secondary | ICD-10-CM

## 2023-10-20 DIAGNOSIS — R600 Localized edema: Secondary | ICD-10-CM

## 2023-10-20 DIAGNOSIS — E785 Hyperlipidemia, unspecified: Secondary | ICD-10-CM

## 2023-10-20 DIAGNOSIS — I5189 Other ill-defined heart diseases: Secondary | ICD-10-CM | POA: Diagnosis not present

## 2023-10-20 DIAGNOSIS — J449 Chronic obstructive pulmonary disease, unspecified: Secondary | ICD-10-CM

## 2023-10-20 DIAGNOSIS — R072 Precordial pain: Secondary | ICD-10-CM | POA: Diagnosis not present

## 2023-10-20 MED ORDER — METOPROLOL TARTRATE 50 MG PO TABS: 50.0000 mg | ORAL_TABLET | Freq: Once | ORAL | 0 refills | Status: DC

## 2023-10-20 NOTE — Assessment & Plan Note (Signed)
 Atypical chest comfort and dyspnea/with exertion.  - Order Coronary CT Angiogram to evaluate for coronary artery disease. - Consider treatment for small vessel disease if coronary CT angiogram is normal and symptoms persist. - Discussed potential use of calcium  channel blocker such as amlodipine if small vessel disease is suspected, but defer for now, due to current swelling issues.

## 2023-10-20 NOTE — Assessment & Plan Note (Addendum)
 She has a diagnosis of diastolic heart failure, but echocardiogram really was not consistent with diastolic heart failure.  She does not really have high blood pressure, and I am reluctant to initiate afterload reduction especially in someone who is reluctant to take medications.  Denies any PND or orthopnea making her edema less likely to be cardiac in nature. Unlikely that there will be a new change in her EF unless there is a gross abnormality noted on Coronary CTA.  As such we will hold off on further testing. Will be reluctant to treat lower extremity edema only with Lasix unless there is evidence of CHF.

## 2023-10-20 NOTE — Assessment & Plan Note (Signed)
 Lipids as of December 2024 showed LDL of 93.  Based on her cardiac catheterization results in the past, likely a reasonable value. - continue atorvastatin  10 mg daily.

## 2023-10-20 NOTE — Assessment & Plan Note (Signed)
 Chronic lower extremity edema likely due to venous insufficiency, not heart failure. Diuretics may not be effective and risk dehydration. - Advise wearing compression stockings, especially in winter. - Recommend elevating legs above heart level when sitting. - Consider as-needed diuretic for fluid management, but caution against dehydration.

## 2023-10-20 NOTE — Progress Notes (Signed)
 Cardiology Office Note:  .   Date:  10/20/2023  ID:  Hannah Green, DOB 1945-09-07, MRN [id number] PCP: [name], MD  [facility] Providers Cardiologist:  Alm Clay, MD     No chief complaint on file.   Patient Profile: .     Hannah Green is a moderately obese 78 y.o. female  with a PMH notable for severe COPD, RA and HLD as well as history of breast cancer who presents here for 1 month follow-up to discuss exertional dyspnea.   Referring Provider: [name], MD.  PMH: Severe COPD (Gold 4) with stage Ia SCCA of RUL (s/p R sided VATS bilobectomy) complicated by brief episode of postop A-fib RA,  history of BrCA  normal Echo and R/LHC    I last saw Hannah Green in July 2024 as a 3 to 60-month follow-up -> she was doing well up until about a week before that visit.  She noted that her legs are starting and painful and swollen.  No energy.  Becoming more sedentary.  More than baseline exertional dyspnea.  No PND orthopnea to a lot of edema.  No palpitations.  Without PND orthopnea.  Edema thought to be more related to venous stasis.  Recheck venous Dopplers and gave PRN furosemide.  No evidence of DVT noted.  There is a Baker's cyst noted in the left leg.  BNP was 85  Subjective  Discussed the use of AI scribe software for clinical note transcription with the patient, who gave verbal consent to proceed.  History of Present Illness Exertional chest pain and dyspnea - Chest pain and shortness of breath occur during physical activity, such as walking from the parking lot or to the mailbox - Symptoms have been progressively worsening - Chest pain is described as sharp and located under the chest - Episodes are associated with rapid heart rate - Symptoms are relieved by rest and do not occur at rest - No chest pain, shortness of breath, or palpitations at rest  Lower extremity edema and paresthesia - Swelling in the legs worsens throughout the day -  Tingling sensation in the back of the legs accompanies swelling - Elevating feet provides minimal relief - No swelling present in the morning; edema increases as the day progresses  Orthopnea and nocturnal symptoms - Sleeps on two pillows - No shortness of breath at night - Wakes up consistently at 3 AM to urinate  History of cardiac evaluation - History of coronary artery disease - Heart catheterization in 2020 showed normal coronary arteries - Echocardiogram in May 2024 showed ejection fraction 60-65%, mild left atrial dilation, and mild mitral valve prolapse without regurgitation  Respiratory medication use and limitations - Previously used Trelegy with improvement in breathing, discontinued due to cost increase from $40 to $166 per month - Uses albuterol  for symptom management, which provides short-term relief  Cardiovascular risk factors and medications - Current medication includes atorvastatin  - Regular cholesterol checks with primary care provider - No history of hypertension - Active lifestyle throughout life     Objective   Medications: Atorvastatin  10 mg daily; albuterol  108 mg.  Unfortunately no longer on Trelegy because of cost.  Studies Reviewed: SABRA   EKG Interpretation Date/Time:  Friday October 20 2023 08:51:26 EDT Ventricular Rate:  62 PR Interval:  164 QRS Duration:  86 QT Interval:  428 QTC Calculation: 434 R Axis:   2  Text Interpretation: Normal sinus rhythm Normal ECG When compared with  ECG of 20-Oct-2023 08:51, No significant change was found Confirmed by Anner Lenis ([id number]) on 10/20/2023 8:58:51 AM    Results DIAGNOSTIC Echocardiogram: Ejection fraction 60-65%, mild left atrial dilation, mild mitral valve prolapse, no regurgitation, mild aortic sclerosis, no stenosis, normal pulmonary artery pressure (06/2022) Cardiac catheterization: Normal coronary arteries, normal right heart pressures (03/2018) Right Heart Cath: PA P-mean: 18/3 mmHg - 11 mmHg;  PCWP: 10 mmHg LV EDP is normal. LVP-EDP: 137/5 mmHg-13 mmHg Ao pressure- MAP: 115/56 mmHg - 80 mmHg; Ao sat 99%. PA sat 76%.  Cardiac Output-Index: 5.9-2.8   Lab Results  Component Value Date   CHOL 175 02/02/2023   HDL 62.80 02/02/2023   LDLCALC 93 02/02/2023   LDLDIRECT 136.2 06/03/2011   TRIG 97.0 02/02/2023   CHOLHDL 3 02/02/2023   Lab Results  Component Value Date   NA 140 10/11/2023   K 4.6 10/11/2023   CREATININE 0.65 10/11/2023   GFRNONAA >60 10/11/2023   GLUCOSE 106 (H) 10/11/2023   Lab Results  Component Value Date   HGBA1C 5.8 02/02/2023     Risk Assessment/Calculations:             Physical Exam:   VS:  BP 132/60 (BP Location: Left Arm, Patient Position: Sitting, Cuff Size: Large)   Pulse 62   Ht 5' 6 (1.676 m)   Wt 232 lb (105.2 kg)   LMP  (LMP Unknown)   SpO2 94%   BMI 37.45 kg/m    Wt Readings from Last 3 Encounters:  10/20/23 232 lb (105.2 kg)  10/18/23 233 lb (105.7 kg)  02/09/23 228 lb 6 oz (103.6 kg)    GEN: Well nourished, well groomed in no acute distress; mildly obese NECK: No JVD; No carotid bruits CARDIAC: Distant heart sounds but normal S1, S2; RRR, no murmurs, rubs, gallops RESPIRATORY: CTAB.  Nonlabored, good air movement.  No rales or rhonchi but audible wheezing.   ABDOMEN: Soft, non-tender, non-distended EXTREMITIES:  No edema; No deformity      ASSESSMENT AND PLAN: .    Problem List Items Addressed This Visit       Cardiology Problems   Hyperlipidemia with target LDL less than 100 (Chronic)   Lipids as of December 2024 showed LDL of 93.  Based on her cardiac catheterization results in the past, likely a reasonable value. - continue atorvastatin  10 mg daily.      Relevant Medications   metoprolol  tartrate (LOPRESSOR ) 50 MG tablet     Other   COPD (chronic obstructive pulmonary disease) (HCC) (Chronic)   COPD previously managed with Trelegy inhaler, now unaffordable. Worsened dyspnea since discontinuation. - Contact  primary care provider to discuss alternative inhaler options to replace Trelegy. - Use albuterol  inhaler as needed for short-term relief of dyspnea.      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  She does not really have high blood pressure, and I am reluctant to initiate afterload reduction especially in someone who is reluctant to take medications.  Denies any PND or orthopnea making her edema less likely to be cardiac in nature. Unlikely that there will be a new change in her EF unless there is a gross abnormality noted on Coronary CTA.  As such we will hold off on further testing. Will be reluctant to treat lower extremity edema only with Lasix unless there is evidence of CHF.  DOE (dyspnea on exertion) - Primary (Chronic)   Intermittent exertional dyspnea and precordial chest pain during activity. Most likely multifactorial, but seems to have gotten worse since stopping her Trelegy.   Previous normal coronary arteries and echocardiogram, however she is noticing chest discomfort as well as dyspnea..  Differential includes untreated COPD, obesity/deconditioning versus coronary artery disease-macro or micro vascular  disease.  - Obviously, I think she needs to be on something to replace the Trelegy.  - Order coronary CT angiogram to evaluate for coronary artery disease.      Relevant Orders   EKG 12-Lead (Completed)   CT CORONARY MORPH W/CTA COR W/SCORE W/CA W/CM &/OR WO/CM   Lower extremity edema   Chronic lower extremity edema likely due to venous insufficiency, not heart failure. Diuretics may not be effective and risk dehydration. - Advise wearing compression stockings, especially in winter. - Recommend elevating legs above heart level when sitting. - Consider as-needed diuretic for fluid management, but caution against dehydration.       Precordial pain   Atypical chest comfort and dyspnea/with exertion.  - Order Coronary CT Angiogram to evaluate for coronary artery disease. - Consider treatment for small vessel disease if coronary CT angiogram is normal and symptoms persist. - Discussed potential use of calcium  channel blocker such as amlodipine if small vessel disease is suspected, but defer for now, due to current swelling issues.      Relevant Orders   CT CORONARY MORPH W/CTA COR W/SCORE W/CA W/CM &/OR WO/CM          Follow-Up: Return in about 2 months (around 12/20/2023) for Routine follow up with me, To discuss test results, Followup with Telemedicine.  I spent 52 minutes in the care of [facility] today including reviewing labs (1 minute), reviewing studies (3-minute reviewing prior echo, 2-minute reviewing prior cath report and films = 5 minutes.), face to face time discussing treatment options (31 minutes), reviewing records from APP notes and my previous note (6 minutes), 9 minutes dictating, and documenting in the encounter.     Signed, Alm MICAEL Clay, MD, MS Alm Clay, M.D., M.S. Interventional Chartered certified accountant  Pager # [phone]

## 2023-10-20 NOTE — Assessment & Plan Note (Signed)
 Intermittent exertional dyspnea and precordial chest pain during activity. Most likely multifactorial, but seems to have gotten worse since stopping her Trelegy.   Previous normal coronary arteries and echocardiogram, however she is noticing chest discomfort as well as dyspnea..  Differential includes untreated COPD, obesity/deconditioning versus coronary artery disease-macro or micro vascular  disease.  - Obviously, I think she needs to be on something to replace the Trelegy.  - Order coronary CT angiogram to evaluate for coronary artery disease.

## 2023-10-20 NOTE — Assessment & Plan Note (Signed)
 COPD previously managed with Trelegy inhaler, now unaffordable. Worsened dyspnea since discontinuation. - Contact primary care provider to discuss alternative inhaler options to replace Trelegy. - Use albuterol  inhaler as needed for short-term relief of dyspnea.

## 2023-10-20 NOTE — Patient Instructions (Addendum)
 Medication Instructions:   Metoprolol  50   mg one time use     *If you need a refill on your cardiac medications before your next appointment, please call your pharmacy*   Lab Work: No labs needed    Testing/Procedures:  Your physician has requested that you have coronary  CTA. Coronary computed tomography (CT)angiogram  is a special type of CT scan that uses a computer to produce multi-dimensional views of major blood vessels throughout the heart.  CT angiography, a contrast material is injected through an IV to help visualize the blood vessels  a painless test that uses an x-ray machine to take clear, detailed pictures of your heart arteries .  Please follow instruction sheet as given.    Follow-Up: At [facility], you and your health needs are our priority.  As part of our continuing mission to provide you with exceptional heart care, we have created designated Provider Care Teams.  These Care Teams include your primary Cardiologist (physician) and Advanced Practice Providers (APPs -  Physician Assistants and Nurse Practitioners) who all work together to provide you with the care you need, when you need it.     Your next appointment:   2 month(s)  The format for your next appointment:   Virtual Visit   Provider:   Alm Clay, MD   Other Instructions   Elevated legs  at least 30 min 3 times a day     Your cardiac CT will be scheduled at one of the below locations:    [facility] [address]  [place], [place] [address]     If scheduled at the [facility] at [address], please enter the parking lot using the [address] entrance and use the FREE valet service at the patient drop-off area. Enter the building and check-in with registration on the main floor.  Please follow these instructions carefully (unless otherwise directed):  An IV will be required for this test and Nitroglycerin will be given.    On  the Night Before the Test: Be sure to Drink plenty of water. Do not consume any caffeinated/decaffeinated beverages or chocolate 12 hours prior to your test. Do not take any antihistamines 12 hours prior to your test. If the patient has contrast allergy:  On the Day of the Test: Drink plenty of water until 1 hour prior to the test. Do not eat any food 1 hour prior to test. You may take your regular medications prior to the test.  Take metoprolol  (Lopressor ) 50 mg  two hours prior to test. Patients who wear a continuous glucose monitor MUST remove the device prior to scanning. FEMALES- please wear underwire-free bra if available, avoid dresses & tight clothing        After the Test: Drink plenty of water. After receiving IV contrast, you may experience a mild flushed feeling. This is normal. On occasion, you may experience a mild rash up to 24 hours after the test. This is not dangerous. If this occurs, you can take Benadryl  25 mg, Zyrtec, Claritin, or Allegra and increase your fluid intake. (Patients taking Tikosyn should avoid Benadryl , and may take Zyrtec, Claritin, or Allegra) If you experience trouble breathing, this can be serious. If it is severe call 911 IMMEDIATELY. If it is mild, please call our office.  We will call to schedule your test 2-4 weeks out understanding that some insurance companies will need an authorization prior to the service being performed.  For more information and frequently asked questions, please visit our website : [email or web address]  For non-scheduling related questions, please contact the cardiac imaging nurse navigator should you have any questions/concerns: Cardiac Imaging Nurse Navigators Direct Office Dial: [phone]   For scheduling needs, including cancellations and rescheduling, please call [place], [phone].

## 2023-10-25 NOTE — Progress Notes (Signed)
 Order(s) created erroneously. Erroneous order ID: [id number]  Order moved by: ELOISA KRABBE  Order move date/time: 10/25/2023 12:16 PM  Source Patient: [id number]  Source Contact: 10/20/2023  Destination Patient: [id number]  Destination Contact: 08/10/2022  Erroneous order ID: [id number]  Order moved by: ELOISA KRABBE  Order move date/time: 10/25/2023 12:16 PM  Source Patient: [id number]  Source Contact: 10/20/2023  Destination Patient: [id number]  Destination Contact: 08/10/2022

## 2023-11-07 ENCOUNTER — Encounter (HOSPITAL_COMMUNITY): Payer: Self-pay

## 2023-11-09 ENCOUNTER — Ambulatory Visit (HOSPITAL_COMMUNITY)

## 2023-12-05 ENCOUNTER — Encounter (HOSPITAL_COMMUNITY): Payer: Self-pay

## 2023-12-28 ENCOUNTER — Other Ambulatory Visit (HOSPITAL_COMMUNITY): Payer: Self-pay | Admitting: *Deleted

## 2023-12-28 DIAGNOSIS — I5189 Other ill-defined heart diseases: Secondary | ICD-10-CM

## 2023-12-28 DIAGNOSIS — R0609 Other forms of dyspnea: Secondary | ICD-10-CM

## 2023-12-28 DIAGNOSIS — R072 Precordial pain: Secondary | ICD-10-CM

## 2023-12-28 MED ORDER — METOPROLOL TARTRATE 50 MG PO TABS: 50.0000 mg | ORAL_TABLET | Freq: Once | ORAL | 0 refills | Status: DC

## 2024-01-01 ENCOUNTER — Ambulatory Visit: Admitting: Cardiology

## 2024-01-01 NOTE — Progress Notes (Deleted)
 Cardiology Office Note   Date:  01/01/2024  ID:  Hannah Green, DOB [birth year], MRN [id number] PCP: Hannah Greig BRAVO, MD  [facility] Providers Cardiologist:  Alm Clay, MD   History of Present Illness Hannah Green is a 78 y.o. female with a past medical history of hyperlipidemia, COPD, chronic diastolic heart failure.  Patient followed by Dr. Clay and presents today for follow-up after recent coronary CTA  Patient previously had a right/left heart catheterization in 03/2018 that showed normal coronary arteries, normal LVEDP, normal right heart cath pressures without evidence of pulmonary hypertension.  Echocardiogram in 06/2022 showed EF 60-65%, no regional wall motion abnormalities, mild LVH, normal RV systolic function, trivial MR, aortic sclerosis without stenosis.  Patient was seen by Dr. Clay on 10/20/2023.  At that time, patient reported some chest pain and shortness of breath that occurred during physical activity.  Felt that some of her shortness of breath was related to her COPD.  She had recently stopped her Trelegy, felt that this was contributing.  Set up for a coronary CTA to rule out ischemia.  Of note, the patient had been diagnosed with diastolic heart failure in the past, her echocardiogram was not consistent with this.  Patient's blood pressure was well-controlled, Dr. Clay was reluctant to initiate afterload reduction as patient was already reluctant to take medications.  Her lower extremity swelling was managed conservatively with compression stockings and elevating legs  DOE Chest pain  - Patient was seen by Dr. Clay in 09/2023 for evaluation of atypical chest pain and dyspnea on exertion.  Of note, symptoms had worsened after she had stopped her Trelegy.  Felt that her symptoms were multifactorial and she was encouraged to try to get on alternative treatment for COPD - Underwent coronary CTA that showed*** - Most recent echocardiogram from 06/2022  showed EF 60-65%, mild LVH, normal RV systolic function   Diastolic heart Failure  Venous Insufficiency  - Echocardiogram 07/05/2022 showed EF 60-65%, no regional wall motion abnormalities, mild LVH, normal RV systolic function, trivial MR - Patient has chronic lower extremity swelling.  Felt to be due to venous insufficiency rather than heart failure.  Had been told she had diastolic heart failure in the past, though her echocardiogram not really consistent with this.  Dr. Clay has been hesitant to start afterload reducing medications or diuretics -  - Continue use of compression socks, elevating legs when at rest  COPD  Lung cancer - Patient has history of stage I non-small cell lung cancer, s/p right upper and middle lobectomy in 2020 - Follows with oncology - Patient recently had to stop her Trelegy due to cost.  Working to find alternative treatment  HLD - Lipid panel from 01/2023 showed LDL 93 - Continue Lipitor 10 mg daily  ROS: ***  Studies Reviewed      *** Risk Assessment/Calculations {Does this patient have ATRIAL FIBRILLATION?[phone]} No BP recorded.  {Refresh Note OR Click here to enter BP  :1}***       Physical Exam VS:  LMP  (LMP Unknown)        Wt Readings from Last 3 Encounters:  10/20/23 232 lb (105.2 kg)  10/18/23 233 lb (105.7 kg)  02/09/23 228 lb 6 oz (103.6 kg)    GEN: Well nourished, well developed in no acute distress NECK: No JVD; No carotid bruits CARDIAC: ***RRR, no murmurs, rubs, gallops RESPIRATORY:  Clear to auscultation without rales, wheezing or rhonchi  ABDOMEN: Soft, non-tender, non-distended EXTREMITIES:  No edema; No deformity   ASSESSMENT AND PLAN ***    {Are you ordering a CV Procedure (e.g. stress test, cath, DCCV, TEE, etc)?   Press F2        :[id number]}  Dispo: ***  Signed, Rollo FABIENE Louder, PA-C

## 2024-01-02 ENCOUNTER — Ambulatory Visit: Payer: Self-pay

## 2024-01-02 ENCOUNTER — Encounter (HOSPITAL_COMMUNITY): Payer: Self-pay

## 2024-01-02 NOTE — Telephone Encounter (Signed)
 FYI Only or Action Required?: Action required by provider: update on patient condition and Patient unable to get Trellegy, asking for Breztri . PCP unwilling to refill. TOC appt 11/21.  Patient is followed in Pulmonology for COPD/Lung CA, last seen on 03/26/2021 by Brenna Cain L, DO.  Called Nurse Triage reporting Shortness of Breath.  Symptoms began ongoing since Lobectomy.  Interventions attempted: Nothing.  Symptoms are: stable.  Triage Disposition: See PCP Within 2 Weeks  Patient/caregiver understands and will follow disposition?:   E2C2 Pulmonary Triage - Initial Assessment Questions "Chief Complaint (e.g., cough, sob, wheezing, fever, chills, sweat or additional symptoms) *Go to specific symptom protocol after initial questions. SOB- needs inhaler alternative  "How long have symptoms been present?" Ongoing- baseline  Have you tested for COVID or Flu? Note: If not, ask patient if a home test can be taken. If so, instruct patient to call back for positive results. No  MEDICINES:   "Have you used any OTC meds to help with symptoms?" No If yes, ask "What medications?" NA  "Have you used your inhalers/maintenance medication?" No If yes, "What medications?" Albuterol  is useless. Unable to get Trellegy due to cost. Asking for Breztri   If inhaler, ask "How many puffs and how often?" Note: Review instructions on medication in the chart. NA  OXYGEN: "Do you wear supplemental oxygen?" No If yes, "How many liters are you supposed to use?" NA  Copied from CRM #[id number]. Topic: Clinical - Red Word Triage >> Jan 02, 2024  4:12 PM Hannah Green wrote: [facility] that prompted transfer to Nurse Triage: Patient [id number] states was seeing Dr. Brenna (lov 03/26/21) and all her medications have ran out for about a year. Patient canno afford the medications: Fluticasone -Umeclidin-Vilant (TRELEGY ELLIPTA ) 100-62.5-25 MCG/ACT AEPB was paying $45 and it was increase to over $100 anBREZTRI   AEROSPHERE 160-9-4.8 MCG/ACT AERO. Patient states cannot go with out an inhalers. Patient states cannot walk without feeling out of breath-shortness of breath, wheezing off and on, denies pain. Patient's pcp was refilling medication but will not longer refill and advised patient to see pulmonologist. Please advise.   [id number] [address] [place], [place] - [address] [place] [address] Phone: [phone] Fax: [phone] Reason for Disposition  [1] MILD longstanding difficulty breathing (e.g., minimal/no SOB at rest, SOB with walking, pulse < 100) AND [2] SAME as normal  Answer Assessment - Initial Assessment Questions Cannot get inhalers due to cost more than doubling. PCP will no longer fill medication for her. Needing to re-establish with Pulm office since Dr Brenna. Patient with no new or worsening symptoms. TOC appt set up for 11/21 and placed on the waitlist for sooner OV.   1. RESPIRATORY STATUS: Describe your breathing? (e.g., wheezing, shortness of breath, unable to speak, severe coughing)      SOB/ Wheezing- baseline since Lobectomy with lung CA 2. ONSET: When did this breathing problem begin?      Ongoing  3. PATTERN Does the difficult breathing come and go, or has it been constant since it started?      Worse with acitivty 4. SEVERITY: How bad is your breathing? (e.g., mild, moderate, severe)      mild 5. RECURRENT SYMPTOM: Have you had difficulty breathing before? If Yes, ask: When was the last time? and What happened that time?      Lung CA with lobectomy 6. CARDIAC HISTORY: Do you have any history of heart disease? (e.g., heart attack, angina, bypass surgery, angioplasty)  denies 7. LUNG HISTORY: Do you have any history of lung disease?  (e.g., pulmonary embolus, asthma, emphysema)     Lung CA 8. CAUSE: What do you think is causing the breathing problem?      Out of inhaler- to expensive 9. OTHER SYMPTOMS: Do you have any  other symptoms? (e.g., chest pain, cough, dizziness, fever, runny nose)     denies 10. O2 SATURATION MONITOR:  Do you use an oxygen saturation monitor (pulse oximeter) at home? If Yes, ask: What is your reading (oxygen level) today? What is your usual oxygen saturation reading? (e.g., 95%)       unsure  Protocols used: Breathing Difficulty-A-AH

## 2024-01-03 ENCOUNTER — Other Ambulatory Visit (HOSPITAL_COMMUNITY): Payer: Self-pay

## 2024-01-03 NOTE — Telephone Encounter (Signed)
 Patient is set to see you on 11/21,she is currently having shortness of breath and the trelegy Is to expensive and she has been without her inhaler,please advise on alternative.  I will send a message over to the pharmacy team to advise on inhaler options that will be more affordable

## 2024-01-03 NOTE — Telephone Encounter (Signed)
 Patient has a high dedeuctible and cannot afford trelegy no costly alternatives for trelegy due to see you on 11/21 should I give patient samples or can you advise on an alternative

## 2024-01-04 ENCOUNTER — Ambulatory Visit (HOSPITAL_COMMUNITY)
Admission: RE | Admit: 2024-01-04 | Discharge: 2024-01-04 | Disposition: A | Discharge status: Home / Self Care | Source: Ambulatory Visit | Attending: Cardiology | Admitting: Cardiology

## 2024-01-04 ENCOUNTER — Other Ambulatory Visit: Payer: Self-pay | Admitting: Cardiology

## 2024-01-04 ENCOUNTER — Ambulatory Visit (HOSPITAL_BASED_OUTPATIENT_CLINIC_OR_DEPARTMENT_OTHER)
Admission: RE | Admit: 2024-01-04 | Discharge: 2024-01-04 | Disposition: A | Discharge status: Home / Self Care | Source: Ambulatory Visit | Attending: Cardiology | Admitting: Cardiology

## 2024-01-04 DIAGNOSIS — R931 Abnormal findings on diagnostic imaging of heart and coronary circulation: Secondary | ICD-10-CM

## 2024-01-04 DIAGNOSIS — I5189 Other ill-defined heart diseases: Secondary | ICD-10-CM | POA: Insufficient documentation

## 2024-01-04 DIAGNOSIS — R072 Precordial pain: Secondary | ICD-10-CM | POA: Diagnosis present

## 2024-01-04 DIAGNOSIS — I2510 Atherosclerotic heart disease of native coronary artery without angina pectoris: Secondary | ICD-10-CM | POA: Diagnosis not present

## 2024-01-04 DIAGNOSIS — R0609 Other forms of dyspnea: Secondary | ICD-10-CM | POA: Diagnosis present

## 2024-01-04 MED ORDER — NITROGLYCERIN 0.4 MG SL SUBL
0.8000 mg | SUBLINGUAL_TABLET | Freq: Once | SUBLINGUAL | Status: AC
  Administered 2024-01-04: 0.8 mg via SUBLINGUAL

## 2024-01-04 MED ORDER — IOHEXOL 350 MG/ML SOLN
100.0000 mL | Freq: Once | INTRAVENOUS | Status: AC | PRN
  Administered 2024-01-04: 100 mL via INTRAVENOUS

## 2024-01-05 ENCOUNTER — Ambulatory Visit: Payer: Self-pay | Admitting: Cardiology

## 2024-01-09 ENCOUNTER — Telehealth: Payer: Self-pay | Admitting: Cardiology

## 2024-01-09 NOTE — Telephone Encounter (Signed)
 Pt states Dr sent results through My Chart and she wanted to know if appt was needed. Please advise   [phone] please call first

## 2024-01-09 NOTE — Progress Notes (Deleted)
 Cardiology Office Note:  .   Date:  01/09/2024  ID:  Hannah Green, DOB 10-21-1945, MRN [id number] PCP: [name], MD  [facility] Providers Cardiologist:  Alm Clay, MD {  History of Present Illness: .   Hannah Green is a 78 y.o. female  with PMHx of Chronic HFpEF, venous insufficiency, hyperlipidemia, COPD, stage I non-small cell lung cancer, s/p right upper and middle lobectomy in 2020 (follow with oncology)  who reports to [facility] office for CCTA follow up.   Last seen in heartcare OV by Dr. Clay in 09/2023 for follow up. Reported progressively worsening exertional dyspnea and chest pain. Noted most likely multifactorial but seems to have gotten worse since stopping Trelegy. Recommended discussing replacement for Trelegy. Ordered CCTA. Also reported LE edema and paresthesia. Suspect secondary to venous insufficiency.  Recommended lifestyle management.   CCTA 01/04/2024: CAC 64, 46th percentile, 25-49% prox LAD/ostial D1/ostial RCA. Coronary CTA FFR analysis demonstrates no significant hemodynamically flow limiting lesions.  Today, reports ### and denies ###.  Reports compliance with medications.  Dietary habitats:  Activity level:  Social: Denies tobacco use/alcohol/drug use  Denies any recent hospitalizations or visits to the emergency department.   DOE (dyspnea on exertion) Atypical chest pain Cath 2020: Normal coronary arteries  ECHO 06/2022: EF 60-65%, mild LVH, mildly dilated LA, trivial MV regurgitation, AV sclerosis w/o AS CCTA 01/04/2024: CAC 64, 46th percentile, 25-49% prox LAD/ostial D1/ostial RCA. FFR with no significant findings.   Chronic heart failure with preserved ejection fraction (HFpEF)  Venous insufficiency ECHO 06/2022 as above: 60-65% Denies SOB, edema, orthopnea, or PND. Appears Euvolemic on exam.  Continue on  GDMT limited due to low blood pressure of .SABRA or AKI/CKD Encouraged low sodium diet, fluid restriction <2L, and daily  weights.  Educated to contact our office for weight gain of 2 lbs overnight or 5 lbs in one week. ED precautions discussed.    Chronic obstructive pulmonary disease, unspecified COPD type History of lung cancer S/P Right Upper Lobectomy, Right Middle Lobectomy Lung Patient has history of stage I non-small cell lung cancer, s/p right upper and middle lobectomy in 2020 Follows with oncology Patient recently had to stop her Trelegy due to cost.  Working to find alternative treatment  Hyperlipidemia with target LDL less than 100 Lipid Panel     Component Value Date/Time   CHOL 175 02/02/2023 0824   TRIG 97.0 02/02/2023 0824   HDL 62.80 02/02/2023 0824   CHOLHDL 3 02/02/2023 0824   VLDL 19.4 02/02/2023 0824   LDLCALC 93 02/02/2023 0824   LDLDIRECT 136.2 06/03/2011 0836  Continue Lipitor 10 mg daily   ROS: 10 point review of system has been reviewed and considered negative except ones been listed in the HPI.   Studies Reviewed: SABRA   CARDIAC CATHETERIZATION 04/10/2018   Conclusion Images from the original result were not included.    Normal coronary arteries  LV end diastolic pressure is normal. (Echocardiogram showed normal EF)  Normal right heart cath pressures with no evidence pulmonary pretension  Normal cardiac output/index   SUMMARY  Angiographically normal coronary arteries  Normal Right Heart Cath Pressures with no evidence of Pulmonary Hypertension  Normal Cardiac Output-Index: 5.9-2.8   --NON-ANGINAL CHEST PAIN AND DYSPNEA   RECOMMENDATIONS  Discharge home after bedrest.  Standard post cath follow-up  Suspect chest pain dyspnea related to pulmonary issue.  No impediment to now proceed with lung surgery  CT CORONARY FRACTIONAL FLOW RESERVE FLUID ANALYSIS 1. Left  Main: 1.00. 2. LAD: No significant stenosis: 0.97, 0.93, 0.93. 3. LCX: No significant stenosis: 1.00, 1.00, 0.95. 4. Ramus: Not modeled. 5. RCA: No significant stenosis: 0.98, 0.96, 0.97.    IMPRESSION: 1. Coronary CTA FFR analysis demonstrates no significant hemodynamically flow limiting lesions.  ECHO IMPRESSIONS 07/05/2022 1. Left ventricular ejection fraction, by estimation, is 60 to 65%. The left ventricle has normal function. The left ventricle has no regional wall motion abnormalities. There is mild left ventricular hypertrophy. Left ventricular diastolic parameters were normal. The average left ventricular global longitudinal strain is -23.6 %. The global longitudinal strain is normal. 2. Right ventricular systolic function is normal. The right ventricular size is normal. Tricuspid regurgitation signal is inadequate for assessing PA pressure. 3. Left atrial size was mildly dilated. 4. The mitral valve is abnormal. Trivial mitral valve regurgitation. No evidence of mitral stenosis. 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 5. The aortic valve is tricuspid. There is mild calcification of the aortic valve. There is mild thickening of the aortic valve. Aortic valve regurgitation is not visualized. Aortic valve sclerosis is present, with no evidence of aortic valve stenosis. 6. The inferior vena cava is normal in size with greater than 50% respiratory variability, suggesting right atrial pressure of 3 mmHg.   Comparison(s): No significant change from prior study.   Conclusion(s)/Recommendation(s): Otherwise normal echocardiogram, with minor abnormalities described in the report. Trivial prolapse of A2 scallop of mitral valve with trivial regurgitation  CT CORONARY MORPH W/CTA COR W/SCORE 01/04/2024  IMPRESSION: 1. Coronary calcium  score of 64.6. This was 46th percentile for age-, sex, and race-matched controls.   2. Normal coronary origin with right dominance.   3. Mild atherosclerosis: 25-49% prox LAD/ostial D1/ostial RCA.   4. This study has been submitted for FFR analysis of the ostial RCA. Risk  Assessment/Calculations:   {Does this patient have ATRIAL FIBRILLATION?[phone]} No BP recorded.  {Refresh Note OR Click here to enter BP  :1}***       Physical Exam:   VS:  LMP  (LMP Unknown)    Wt Readings from Last 3 Encounters:  10/20/23 232 lb (105.2 kg)  10/18/23 233 lb (105.7 kg)  02/09/23 228 lb 6 oz (103.6 kg)    GEN: Well nourished, well developed in no acute distress while sitting in chair.  NECK: No JVD; No carotid bruits CARDIAC: ***RRR, no murmurs, rubs, gallops RESPIRATORY:  Clear to auscultation without rales, wheezing or rhonchi  ABDOMEN: Soft, non-tender, non-distended EXTREMITIES:  No edema; No deformity   ASSESSMENT AND PLAN: .   ***    {Are you ordering a CV Procedure (e.g. stress test, cath, DCCV, TEE, etc)?   Press F2        :[id number]}  Dispo: ***  Signed, Lorette CINDERELLA Kapur, PA-C

## 2024-01-09 NOTE — Telephone Encounter (Signed)
 Please advise next follow up? I see that the patient was supposed to follow up in 11/2023.

## 2024-01-10 ENCOUNTER — Ambulatory Visit: Attending: Cardiology | Admitting: Physician Assistant

## 2024-01-10 DIAGNOSIS — E785 Hyperlipidemia, unspecified: Secondary | ICD-10-CM

## 2024-01-10 DIAGNOSIS — I872 Venous insufficiency (chronic) (peripheral): Secondary | ICD-10-CM

## 2024-01-10 DIAGNOSIS — R0789 Other chest pain: Secondary | ICD-10-CM

## 2024-01-10 DIAGNOSIS — Z85118 Personal history of other malignant neoplasm of bronchus and lung: Secondary | ICD-10-CM

## 2024-01-10 DIAGNOSIS — R0609 Other forms of dyspnea: Secondary | ICD-10-CM

## 2024-01-10 DIAGNOSIS — Z902 Acquired absence of lung [part of]: Secondary | ICD-10-CM

## 2024-01-10 DIAGNOSIS — J449 Chronic obstructive pulmonary disease, unspecified: Secondary | ICD-10-CM

## 2024-01-10 DIAGNOSIS — I5032 Chronic diastolic (congestive) heart failure: Secondary | ICD-10-CM

## 2024-01-10 NOTE — Telephone Encounter (Signed)
Left message to call back MyChart message sent 

## 2024-01-10 NOTE — Telephone Encounter (Signed)
 In general when I order tests like this I have people scheduled to see me back to discuss the results.  I am also intending to reassess their symptoms to see if any else needs to be done.  If she is feeling well and not having any more issues, then that is fine if she does not follow-up.  I am happy to see her in follow-up if she would like to come in and discuss her symptoms and discussed the results.  If she is still symptomatic then we can discuss other potential options.   Alm Clay, MD

## 2024-01-11 ENCOUNTER — Encounter: Payer: Self-pay | Admitting: Physician Assistant

## 2024-01-11 ENCOUNTER — Ambulatory Visit (INDEPENDENT_AMBULATORY_CARE_PROVIDER_SITE_OTHER)

## 2024-01-11 DIAGNOSIS — Z23 Encounter for immunization: Secondary | ICD-10-CM | POA: Diagnosis not present

## 2024-01-15 NOTE — Telephone Encounter (Signed)
ATC 

## 2024-01-18 NOTE — Progress Notes (Incomplete)
 Patient Pulmonology Office Visit   Subjective:  Patient ID: Hannah Green, female    DOB: Jul 27, 1945  MRN: [id number]  Referred by: [name], MD  CC: No chief complaint on file.   HPI Hannah Green is a 78 y.o. female with past medical history of breast cancer diagnosed in 1999 status post resection and radiation. Patient has a longstanding history of smoking, 50 pack years. 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 Still having dyspnea on exertion   {PULM QUESTIONNAIRES (Optional):33196}  ROS  Allergies: Adhesive [tape], Neosporin [neomycin-bacitracin zn-polymyx], and Penicillins  Current Outpatient Medications:    albuterol  (VENTOLIN  HFA) 108 (90 Base) MCG/ACT inhaler, Inhale 2 puffs into the lungs every 6 (six) hours as needed for wheezing or shortness of breath., Disp: 8 g, Rfl: 2   atorvastatin  (LIPITOR) 10 MG tablet, TAKE 1 TABLET BY MOUTH EVERY DAY, Disp: 100 tablet, Rfl: 2   azithromycin  (ZITHROMAX ) 250 MG tablet, Take 1 tablet (250 mg total) by mouth daily. Take first 2 tablets together, then 1 every day until finished. (Patient not taking: Reported on 10/20/2023), Disp: 6 tablet, Rfl: 0   Cholecalciferol (D3-50) 1.25 MG (50000 UT) capsule, Take 1 capsule (50,000 Units total) by mouth once a week., Disp: 12 capsule, Rfl: 3   Fluticasone -Umeclidin-Vilant (TRELEGY ELLIPTA ) 100-62.5-25 MCG/ACT AEPB, Inhale 1 puff into the lungs daily. (Patient not taking: Reported on 10/20/2023), Disp: 1 each, Rfl: 11   Ivermectin  1 % CREA, Apply 1 Application topically as directed. Apply to face qd to bid  for Rosacea, Disp: 30 g, Rfl: 11   metoprolol  tartrate (LOPRESSOR ) 50 MG tablet, Take 1 tablet (50 mg total) by mouth once for 1 dose. TAKE TWO HOURS PRIOR TO  SCHEDULE CARDIAC TEST, Disp: 1 tablet, Rfl: 0   minoxidil  (LONITEN ) 2.5 MG tablet, Take 1  tablet (2.5 mg total) by mouth daily., Disp: 90 tablet, Rfl: 4   omeprazole  (PRILOSEC) 40 MG capsule, Take 40 mg by mouth daily., Disp: , Rfl:  Past Medical History:  Diagnosis Date   Breast cancer (HCC) 1999   Cataract 2017   resolved with surgery   COPD (chronic obstructive pulmonary disease) (HCC)    Coronary artery disease    Dysplastic nevus 09/04/2012   R prox calf - mild    GERD (gastroesophageal reflux disease)    Hyperlipidemia    Postoperative atrial fibrillation (HCC) 03/2018   Rheumatoid arthritis(714.0)    Skin cancer    face, txted in past   Squamous cell carcinoma of lung, stage I, right (HCC) 2020   Status post right upper and middle lobectomy   Past Surgical History:  Procedure Laterality Date   BREAST LUMPECTOMY  1999   left, needed radiation   CATARACT EXTRACTION W/ INTRAOCULAR LENS  IMPLANT, BILATERAL Bilateral 05/19/15, 06/02/15   CHEST TUBE INSERTION Right 04/23/2018   Procedure: Right Lateral Chest Tube Insertion;  Surgeon: Kerrin Elspeth BROCKS, MD;  Location: [facility] OR;  Service: Thoracic;  Laterality: Right;   CHOLECYSTECTOMY     COLONOSCOPY WITH PROPOFOL  N/A 12/02/2022   Procedure: COLONOSCOPY WITH PROPOFOL ;  Surgeon: Rollin Dover, MD;  Location: [facility];  Service: Gastroenterology;  Laterality: N/A;   ESOPHAGOGASTRODUODENOSCOPY (EGD) WITH PROPOFOL  N/A 06/24/2022   Procedure: ESOPHAGOGASTRODUODENOSCOPY (EGD) WITH PROPOFOL ;  Surgeon: Rollin Dover, MD;  Location: [facility];  Service: Gastroenterology;  Laterality: N/A;   LOBECTOMY Right 04/23/2018   Procedure: RIGHT UPPER LOBECTOMY;  Surgeon: Kerrin Elspeth BROCKS, MD;  Location: [facility] OR;  Service: Thoracic;  Laterality: Right;   NM MYOVIEW  LTD  01/2016   LOW RISK. Hypertensive response to exercise followed by significant drop (stage I pressure 211/70 drop to 118/75. Stage II). Apical artifact but otherwise no ischemia or infarction. EF 64%.   NODE DISSECTION Right 04/23/2018   Procedure: MEDIASTINAL NODE  DISSECTION;  Surgeon: Kerrin Elspeth BROCKS, MD;  Location: [facility] OR;  Service: Thoracic;  Laterality: Right;   POLYPECTOMY  12/02/2022   Procedure: POLYPECTOMY;  Surgeon: Rollin Dover, MD;  Location: [facility];  Service: Gastroenterology;;   RIGHT/LEFT HEART CATH AND CORONARY ANGIOGRAPHY N/A 04/10/2018   Procedure: RIGHT/LEFT HEART CATH AND CORONARY ANGIOGRAPHY;  Surgeon: Anner Alm ORN, MD;  Location: [facility];; angiographically minimal coronary artery disease.  Normal LVEDP.  Basely normal RHC pressures with no suggestion of pulmonary hypertension.  Normal CO-CI by Fick (5.9-2.8)   SAVORY DILATION N/A 06/24/2022   Procedure: SAVORY DILATION;  Surgeon: Rollin Dover, MD;  Location: [facility];  Service: Gastroenterology;  Laterality: N/A;   TONSILLECTOMY     TRANSTHORACIC ECHOCARDIOGRAM  11/'17; 2/'20   a) Nov 2017: Mild LVH. EF 60-65%. GR 2-DD; b) Feb 2020: Normal LV size and function.  EF 60 to 65%.  No R WMA.  GR 1 DD.  Normal RV size and function.  Normal pressures.  No valvular disease.   VIDEO ASSISTED THORACOSCOPY (VATS)/WEDGE RESECTION Right 04/23/2018   Procedure: VIDEO ASSISTED THORACOSCOPY (VATS)/WEDGE RESECTION OF RIGHT UPPER LUNG;  Surgeon: Kerrin Elspeth BROCKS, MD;  Location: [facility] OR;  Service: Thoracic;  Laterality: Right;   Family History  Problem Relation Age of Onset   Cancer Mother        breast   Cancer Father        lung cancer   Cancer Sister        throat cancer   Diabetes Paternal Uncle    Diabetes Maternal Grandmother    Social History   Socioeconomic History   Marital status: Divorced    Spouse name: Not on file   Number of children: 2   Years of education: Not on file   Highest education level: Not on file  Occupational History   Occupation: Retired production designer, theatre/television/film    Comment: Chief strategy officer  Tobacco Use   Smoking status: Former    Current packs/day: 0.00    Average packs/day: 1.5 packs/day for 50.0 years (75.0 ttl pk-yrs)    Types: Cigarettes     Start date: 03/31/1961    Quit date: 04/01/2011    Years since quitting: 12.8   Smokeless tobacco: Never   Tobacco comments:    has stopped but has started again in crisis  Vaping Use   Vaping status: Never Used  Substance and Sexual Activity   Alcohol use: Yes    Alcohol/week: 2.0 standard drinks of alcohol    Types: 2 Glasses of wine per week    Comment: monthly   Drug use: No   Sexual activity: Never  Other Topics Concern   Not on file  Social History Narrative   Lives alone   No living will   Requests friend Bruna Leash to make health care decisions.   Would accept resuscitation but no prolonged ventilation   Would not want tube feeds if cognitively unaware   Social Drivers of Health   Financial Resource Strain: Low Risk  (  01/18/2023)   Overall Financial Resource Strain (CARDIA)    Difficulty of Paying Living Expenses: Not hard at all  Food Insecurity: No Food Insecurity (01/18/2023)   Hunger Vital Sign    Worried About Running Out of Food in the Last Year: Never true    Ran Out of Food in the Last Year: Never true  Transportation Needs: No Transportation Needs (01/18/2023)   PRAPARE - Administrator, Civil Service (Medical): No    Lack of Transportation (Non-Medical): No  Physical Activity: Inactive (01/18/2023)   Exercise Vital Sign    Days of Exercise per Week: 0 days    Minutes of Exercise per Session: 0 min  Stress: No Stress Concern Present (01/18/2023)   [employer] of Occupational Health - Occupational Stress Questionnaire    Feeling of Stress : Not at all  Social Connections: Moderately Isolated (01/18/2023)   Social Connection and Isolation Panel    Frequency of Communication with Friends and Family: More than three times a week    Frequency of Social Gatherings with Friends and Family: Three times a week    Attends Religious Services: Never    Active Member of Clubs or Organizations: Yes    Attends Banker Meetings: More  than 4 times per year    Marital Status: Divorced  Intimate Partner Violence: Not At Risk (01/18/2023)   Humiliation, Afraid, Rape, and Kick questionnaire    Fear of Current or Ex-Partner: No    Emotionally Abused: No    Physically Abused: No    Sexually Abused: No       Objective:  LMP  (LMP Unknown)  {Pulm Vitals (Optional):32837}  Physical Exam  Diagnostic Review:  {Labs (Optional):32838}    CT chest lung 10/11/23 1. Right upper and right middle lobectomy. 2. Unchanged small pulmonary nodules, benign. 3. No evidence of lymphadenopathy or metastatic disease in the chest. 4. Emphysema. 5. Coronary artery disease. 6. Hepatic steatosis.  Assessment & Plan:   Assessment & Plan   Stage 3 severe COPD by GOLD classification (HCC)   Dyspnea on exertion   S/P Right Upper Lobectomy, Right Middle Lobectomy Lung   Discussion:   This is a 78 year old female, severe COPD FEV1 50% predicted, postbronchodilator response and a DLCO of 85%, relatively unchanged from her preop PFTs with reduced FEV1 1.4 L to 1.2 L.  She still has some dyspnea on exertion currently managed with triple therapy inhaler regimen.  She has lost weight which is also had improvement in some of her symptoms  No follow-ups on file.    Marny Patch, MD Pulmonary and Critical Care Medicine [facility]

## 2024-01-19 ENCOUNTER — Encounter

## 2024-01-22 DIAGNOSIS — Z9841 Cataract extraction status, right eye: Secondary | ICD-10-CM | POA: Diagnosis not present

## 2024-01-22 DIAGNOSIS — H18523 Epithelial (juvenile) corneal dystrophy, bilateral: Secondary | ICD-10-CM | POA: Diagnosis not present

## 2024-01-22 DIAGNOSIS — H5212 Myopia, left eye: Secondary | ICD-10-CM | POA: Diagnosis not present

## 2024-01-22 DIAGNOSIS — Z9842 Cataract extraction status, left eye: Secondary | ICD-10-CM | POA: Diagnosis not present

## 2024-01-22 NOTE — Telephone Encounter (Signed)
 ATC x2

## 2024-01-23 NOTE — Progress Notes (Incomplete)
 Patient Pulmonology Office Visit   Subjective:  Patient ID: Hannah Green, female    DOB: 10-01-1945  MRN: [id number]  Referred by: [name], MD  CC: No chief complaint on file.   HPI Hannah Green is a 78 y.o. female with past medical history of breast cancer diagnosed in 1999 status post resection and radiation. Patient has a longstanding history of smoking, 50 pack years. 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 Still having dyspnea on exertion   {PULM QUESTIONNAIRES (Optional):33196}  ROS  Allergies: Adhesive [tape], Neosporin [neomycin-bacitracin zn-polymyx], and Penicillins  Current Outpatient Medications:    albuterol  (VENTOLIN  HFA) 108 (90 Base) MCG/ACT inhaler, Inhale 2 puffs into the lungs every 6 (six) hours as needed for wheezing or shortness of breath., Disp: 8 g, Rfl: 2   atorvastatin  (LIPITOR) 10 MG tablet, TAKE 1 TABLET BY MOUTH EVERY DAY, Disp: 100 tablet, Rfl: 2   azithromycin  (ZITHROMAX ) 250 MG tablet, Take 1 tablet (250 mg total) by mouth daily. Take first 2 tablets together, then 1 every day until finished. (Patient not taking: Reported on 10/20/2023), Disp: 6 tablet, Rfl: 0   Cholecalciferol (D3-50) 1.25 MG (50000 UT) capsule, Take 1 capsule (50,000 Units total) by mouth once a week., Disp: 12 capsule, Rfl: 3   Fluticasone -Umeclidin-Vilant (TRELEGY ELLIPTA ) 100-62.5-25 MCG/ACT AEPB, Inhale 1 puff into the lungs daily. (Patient not taking: Reported on 10/20/2023), Disp: 1 each, Rfl: 11   Ivermectin  1 % CREA, Apply 1 Application topically as directed. Apply to face qd to bid  for Rosacea, Disp: 30 g, Rfl: 11   metoprolol  tartrate (LOPRESSOR ) 50 MG tablet, Take 1 tablet (50 mg total) by mouth once for 1 dose. TAKE TWO HOURS PRIOR TO  SCHEDULE CARDIAC TEST, Disp: 1 tablet, Rfl: 0   minoxidil  (LONITEN ) 2.5 MG tablet, Take 1  tablet (2.5 mg total) by mouth daily., Disp: 90 tablet, Rfl: 4   omeprazole  (PRILOSEC) 40 MG capsule, Take 40 mg by mouth daily., Disp: , Rfl:  Past Medical History:  Diagnosis Date   Breast cancer (HCC) 1999   Cataract 2017   resolved with surgery   COPD (chronic obstructive pulmonary disease) (HCC)    Coronary artery disease    Dysplastic nevus 09/04/2012   R prox calf - mild    GERD (gastroesophageal reflux disease)    Hyperlipidemia    Postoperative atrial fibrillation (HCC) 03/2018   Rheumatoid arthritis(714.0)    Skin cancer    face, txted in past   Squamous cell carcinoma of lung, stage I, right (HCC) 2020   Status post right upper and middle lobectomy   Past Surgical History:  Procedure Laterality Date   BREAST LUMPECTOMY  1999   left, needed radiation   CATARACT EXTRACTION W/ INTRAOCULAR LENS  IMPLANT, BILATERAL Bilateral 05/19/15, 06/02/15   CHEST TUBE INSERTION Right 04/23/2018   Procedure: Right Lateral Chest Tube Insertion;  Surgeon: Kerrin Elspeth BROCKS, MD;  Location: [facility] OR;  Service: Thoracic;  Laterality: Right;   CHOLECYSTECTOMY     COLONOSCOPY WITH PROPOFOL  N/A 12/02/2022   Procedure: COLONOSCOPY WITH PROPOFOL ;  Surgeon: Rollin Dover, MD;  Location: [facility];  Service: Gastroenterology;  Laterality: N/A;   ESOPHAGOGASTRODUODENOSCOPY (EGD) WITH PROPOFOL  N/A 06/24/2022   Procedure: ESOPHAGOGASTRODUODENOSCOPY (EGD) WITH PROPOFOL ;  Surgeon: Rollin Dover, MD;  Location: [facility];  Service: Gastroenterology;  Laterality: N/A;   LOBECTOMY Right 04/23/2018   Procedure: RIGHT UPPER LOBECTOMY;  Surgeon: Kerrin Elspeth BROCKS, MD;  Location: [facility] OR;  Service: Thoracic;  Laterality: Right;   NM MYOVIEW  LTD  01/2016   LOW RISK. Hypertensive response to exercise followed by significant drop (stage I pressure 211/70 drop to 118/75. Stage II). Apical artifact but otherwise no ischemia or infarction. EF 64%.   NODE DISSECTION Right 04/23/2018   Procedure: MEDIASTINAL NODE  DISSECTION;  Surgeon: Kerrin Elspeth BROCKS, MD;  Location: [facility] OR;  Service: Thoracic;  Laterality: Right;   POLYPECTOMY  12/02/2022   Procedure: POLYPECTOMY;  Surgeon: Rollin Dover, MD;  Location: [facility];  Service: Gastroenterology;;   RIGHT/LEFT HEART CATH AND CORONARY ANGIOGRAPHY N/A 04/10/2018   Procedure: RIGHT/LEFT HEART CATH AND CORONARY ANGIOGRAPHY;  Surgeon: Anner Alm ORN, MD;  Location: [facility];; angiographically minimal coronary artery disease.  Normal LVEDP.  Basely normal RHC pressures with no suggestion of pulmonary hypertension.  Normal CO-CI by Fick (5.9-2.8)   SAVORY DILATION N/A 06/24/2022   Procedure: SAVORY DILATION;  Surgeon: Rollin Dover, MD;  Location: [facility];  Service: Gastroenterology;  Laterality: N/A;   TONSILLECTOMY     TRANSTHORACIC ECHOCARDIOGRAM  11/'17; 2/'20   a) Nov 2017: Mild LVH. EF 60-65%. GR 2-DD; b) Feb 2020: Normal LV size and function.  EF 60 to 65%.  No R WMA.  GR 1 DD.  Normal RV size and function.  Normal pressures.  No valvular disease.   VIDEO ASSISTED THORACOSCOPY (VATS)/WEDGE RESECTION Right 04/23/2018   Procedure: VIDEO ASSISTED THORACOSCOPY (VATS)/WEDGE RESECTION OF RIGHT UPPER LUNG;  Surgeon: Kerrin Elspeth BROCKS, MD;  Location: [facility] OR;  Service: Thoracic;  Laterality: Right;   Family History  Problem Relation Age of Onset   Cancer Mother        breast   Cancer Father        lung cancer   Cancer Sister        throat cancer   Diabetes Paternal Uncle    Diabetes Maternal Grandmother    Social History   Socioeconomic History   Marital status: Divorced    Spouse name: Not on file   Number of children: 2   Years of education: Not on file   Highest education level: Not on file  Occupational History   Occupation: Retired production designer, theatre/television/film    Comment: Chief strategy officer  Tobacco Use   Smoking status: Former    Current packs/day: 0.00    Average packs/day: 1.5 packs/day for 50.0 years (75.0 ttl pk-yrs)    Types: Cigarettes     Start date: 03/31/1961    Quit date: 04/01/2011    Years since quitting: 12.8   Smokeless tobacco: Never   Tobacco comments:    has stopped but has started again in crisis  Vaping Use   Vaping status: Never Used  Substance and Sexual Activity   Alcohol use: Yes    Alcohol/week: 2.0 standard drinks of alcohol    Types: 2 Glasses of wine per week    Comment: monthly   Drug use: No   Sexual activity: Never  Other Topics Concern   Not on file  Social History Narrative   Lives alone   No living will   Requests friend Bruna Leash to make health care decisions.   Would accept resuscitation but no prolonged ventilation   Would not want tube feeds if cognitively unaware   Social Drivers of Health   Financial Resource Strain: Low Risk  (  01/18/2023)   Overall Financial Resource Strain (CARDIA)    Difficulty of Paying Living Expenses: Not hard at all  Food Insecurity: No Food Insecurity (01/18/2023)   Hunger Vital Sign    Worried About Running Out of Food in the Last Year: Never true    Ran Out of Food in the Last Year: Never true  Transportation Needs: No Transportation Needs (01/18/2023)   PRAPARE - Administrator, Civil Service (Medical): No    Lack of Transportation (Non-Medical): No  Physical Activity: Inactive (01/18/2023)   Exercise Vital Sign    Days of Exercise per Week: 0 days    Minutes of Exercise per Session: 0 min  Stress: No Stress Concern Present (01/18/2023)   [employer] of Occupational Health - Occupational Stress Questionnaire    Feeling of Stress : Not at all  Social Connections: Moderately Isolated (01/18/2023)   Social Connection and Isolation Panel    Frequency of Communication with Friends and Family: More than three times a week    Frequency of Social Gatherings with Friends and Family: Three times a week    Attends Religious Services: Never    Active Member of Clubs or Organizations: Yes    Attends Banker Meetings: More  than 4 times per year    Marital Status: Divorced  Intimate Partner Violence: Not At Risk (01/18/2023)   Humiliation, Afraid, Rape, and Kick questionnaire    Fear of Current or Ex-Partner: No    Emotionally Abused: No    Physically Abused: No    Sexually Abused: No       Objective:  LMP  (LMP Unknown)  {Pulm Vitals (Optional):32837}  Physical Exam  Diagnostic Review:  {Labs (Optional):32838}    CT chest lung 10/11/23 1. Right upper and right middle lobectomy. 2. Unchanged small pulmonary nodules, benign. 3. No evidence of lymphadenopathy or metastatic disease in the chest. 4. Emphysema. 5. Coronary artery disease. 6. Hepatic steatosis.  Assessment & Plan:   Assessment & Plan   Stage 3 severe COPD by GOLD classification (HCC)   Dyspnea on exertion   S/P Right Upper Lobectomy, Right Middle Lobectomy Lung   Discussion:   This is a 78 year old female, severe COPD FEV1 50% predicted, postbronchodilator response and a DLCO of 85%, relatively unchanged from her preop PFTs with reduced FEV1 1.4 L to 1.2 L.  She still has some dyspnea on exertion currently managed with triple therapy inhaler regimen.  She has lost weight which is also had improvement in some of her symptoms  No follow-ups on file.    Marny Patch, MD Pulmonary and Critical Care Medicine [facility]

## 2024-01-24 ENCOUNTER — Other Ambulatory Visit: Payer: Self-pay

## 2024-01-24 ENCOUNTER — Telehealth: Payer: Self-pay | Admitting: *Deleted

## 2024-01-24 ENCOUNTER — Ambulatory Visit

## 2024-01-24 VITALS — BP 137/86 | HR 70 | Temp 98.3°F | Resp 20 | Ht 66.0 in | Wt 235.2 lb

## 2024-01-24 DIAGNOSIS — R1011 Right upper quadrant pain: Secondary | ICD-10-CM | POA: Diagnosis not present

## 2024-01-24 DIAGNOSIS — C3491 Malignant neoplasm of unspecified part of right bronchus or lung: Secondary | ICD-10-CM

## 2024-01-24 DIAGNOSIS — Z87891 Personal history of nicotine dependence: Secondary | ICD-10-CM | POA: Diagnosis not present

## 2024-01-24 DIAGNOSIS — E785 Hyperlipidemia, unspecified: Secondary | ICD-10-CM

## 2024-01-24 DIAGNOSIS — J449 Chronic obstructive pulmonary disease, unspecified: Secondary | ICD-10-CM

## 2024-01-24 DIAGNOSIS — E559 Vitamin D deficiency, unspecified: Secondary | ICD-10-CM

## 2024-01-24 DIAGNOSIS — R7303 Prediabetes: Secondary | ICD-10-CM

## 2024-01-24 MED ORDER — ALBUTEROL SULFATE HFA 108 (90 BASE) MCG/ACT IN AERS: 2.0000 | INHALATION_SPRAY | Freq: Four times a day (QID) | RESPIRATORY_TRACT | 6 refills | Status: AC | PRN

## 2024-01-24 MED ORDER — TRELEGY ELLIPTA 200-62.5-25 MCG/ACT IN AEPB: 1.0000 | INHALATION_SPRAY | Freq: Every day | RESPIRATORY_TRACT | 6 refills | Status: DC

## 2024-01-24 MED ORDER — TRELEGY ELLIPTA 200-62.5-25 MCG/ACT IN AEPB: 1.0000 | INHALATION_SPRAY | Freq: Every day | RESPIRATORY_TRACT | Status: AC

## 2024-01-24 MED ORDER — ALBUTEROL SULFATE (2.5 MG/3ML) 0.083% IN NEBU: 2.5000 mg | INHALATION_SOLUTION | Freq: Four times a day (QID) | RESPIRATORY_TRACT | Status: AC | PRN

## 2024-01-24 MED ORDER — SPIRIVA RESPIMAT 2.5 MCG/ACT IN AERS: 2.0000 | INHALATION_SPRAY | Freq: Every day | RESPIRATORY_TRACT | 6 refills | Status: AC

## 2024-01-24 MED ORDER — FLUTICASONE-SALMETEROL 500-50 MCG/ACT IN AEPB: 1.0000 | INHALATION_SPRAY | Freq: Two times a day (BID) | RESPIRATORY_TRACT | 6 refills | Status: AC

## 2024-01-24 NOTE — Patient Instructions (Addendum)
 Dear Ms. Vanover; I recommend the following: -Advair  1 puff twice a day. Rinse your mouth after every use -Spiriva  2 puffs in the morning  -Albuterol  Inhaler as need for shortness of breath. -Nebulizer albuterol , every 6hours as need for severe shortness of breath. -Pulmonary function test at the next visit.

## 2024-01-24 NOTE — Telephone Encounter (Signed)
-----   Message from Hannah Green sent at 01/24/2024 11:03 AM EST ----- Regarding: Labs Wed 02/07/24 Hello,  Patient is coming in for CPE labs. Can we get orders please.   Thanks

## 2024-01-27 DIAGNOSIS — J189 Pneumonia, unspecified organism: Secondary | ICD-10-CM | POA: Diagnosis not present

## 2024-02-07 ENCOUNTER — Other Ambulatory Visit (INDEPENDENT_AMBULATORY_CARE_PROVIDER_SITE_OTHER)

## 2024-02-07 DIAGNOSIS — E785 Hyperlipidemia, unspecified: Secondary | ICD-10-CM | POA: Diagnosis not present

## 2024-02-07 DIAGNOSIS — E559 Vitamin D deficiency, unspecified: Secondary | ICD-10-CM | POA: Diagnosis not present

## 2024-02-07 DIAGNOSIS — R7303 Prediabetes: Secondary | ICD-10-CM | POA: Diagnosis not present

## 2024-02-07 LAB — LIPID PANEL
Cholesterol: 212 mg/dL — ABNORMAL HIGH (ref 0–200)
HDL: 66.7 mg/dL (ref >39.00)
LDL Cholesterol: 118 mg/dL — ABNORMAL HIGH (ref 0–99)
NonHDL: 145.18
Total CHOL/HDL Ratio: 3
Triglycerides: 135 mg/dL (ref 0.0–149.0)
VLDL: 27 mg/dL (ref 0.0–40.0)

## 2024-02-07 LAB — COMPREHENSIVE METABOLIC PANEL WITH GFR
ALT: 18 U/L (ref 0–35)
AST: 18 U/L (ref 0–37)
Albumin: 4 g/dL (ref 3.5–5.2)
Alkaline Phosphatase: 66 U/L (ref 39–117)
BUN: 17 mg/dL (ref 6–23)
CO2: 31 meq/L (ref 19–32)
Calcium: 9.4 mg/dL (ref 8.4–10.5)
Chloride: 104 meq/L (ref 96–112)
Creatinine, Ser: 0.67 mg/dL (ref 0.40–1.20)
GFR: 83.66 mL/min (ref >60.00)
Glucose, Bld: 116 mg/dL — ABNORMAL HIGH (ref 70–99)
Potassium: 4.2 meq/L (ref 3.5–5.1)
Sodium: 141 meq/L (ref 135–145)
Total Bilirubin: 0.5 mg/dL (ref 0.2–1.2)
Total Protein: 6.3 g/dL (ref 6.0–8.3)

## 2024-02-07 LAB — VITAMIN D 25 HYDROXY (VIT D DEFICIENCY, FRACTURES): VITD: 31.47 ng/mL (ref 30.00–100.00)

## 2024-02-07 LAB — HEMOGLOBIN A1C: Hgb A1c MFr Bld: 5.7 % (ref 4.6–6.5)

## 2024-02-08 ENCOUNTER — Ambulatory Visit: Payer: Self-pay | Admitting: Family Medicine

## 2024-02-08 ENCOUNTER — Other Ambulatory Visit

## 2024-02-08 NOTE — Progress Notes (Signed)
 No critical labs need to be addressed urgently. We will discuss labs in detail at upcoming office visit.

## 2024-02-15 ENCOUNTER — Ambulatory Visit: Admitting: Family Medicine

## 2024-02-15 ENCOUNTER — Encounter: Payer: Self-pay | Admitting: Family Medicine

## 2024-02-15 VITALS — BP 130/60 | HR 77 | Temp 97.7°F | Ht 66.0 in | Wt 237.1 lb

## 2024-02-15 DIAGNOSIS — J449 Chronic obstructive pulmonary disease, unspecified: Secondary | ICD-10-CM

## 2024-02-15 DIAGNOSIS — M069 Rheumatoid arthritis, unspecified: Secondary | ICD-10-CM

## 2024-02-15 DIAGNOSIS — E559 Vitamin D deficiency, unspecified: Secondary | ICD-10-CM | POA: Diagnosis not present

## 2024-02-15 DIAGNOSIS — E785 Hyperlipidemia, unspecified: Secondary | ICD-10-CM

## 2024-02-15 DIAGNOSIS — Z0000 Encounter for general adult medical examination without abnormal findings: Secondary | ICD-10-CM

## 2024-02-15 DIAGNOSIS — Z85118 Personal history of other malignant neoplasm of bronchus and lung: Secondary | ICD-10-CM | POA: Diagnosis not present

## 2024-02-15 DIAGNOSIS — R7303 Prediabetes: Secondary | ICD-10-CM | POA: Diagnosis not present

## 2024-02-15 MED ORDER — ALBUTEROL SULFATE (2.5 MG/3ML) 0.083% IN NEBU: 2.5000 mg | INHALATION_SOLUTION | Freq: Four times a day (QID) | RESPIRATORY_TRACT | 1 refills | Status: AC | PRN

## 2024-02-15 MED ORDER — WEGOVY 0.25 MG/0.5ML ~~LOC~~ SOAJ: 0.2500 mg | SUBCUTANEOUS | 1 refills | Status: AC

## 2024-02-15 MED ORDER — OMEPRAZOLE 40 MG PO CPDR: 40.0000 mg | DELAYED_RELEASE_CAPSULE | Freq: Every day | ORAL | 3 refills | Status: AC

## 2024-02-15 NOTE — Assessment & Plan Note (Signed)
Chronic, stable control  Encouraged exercise, weight loss, healthy eating habits.

## 2024-02-15 NOTE — Assessment & Plan Note (Signed)
Status postresection, oncologist is Dr. Mora Appl.

## 2024-02-15 NOTE — Assessment & Plan Note (Addendum)
 Chronic, stable control  Cannot afford  trelegy. Followed by pulmonary. Albuterol  prn and nebulizer.. she has not gotten the prescription for the  medication  Clarified medications in detail for her she is post to take both Spiriva  and Advair to equal similar profile as Trelegy.  I sent in her albuterol  nebulizer solution as it was sent incorrectly by pulmonary.

## 2024-02-15 NOTE — Assessment & Plan Note (Signed)
 Chronic worsened control  Per cards  LDL goal just less than 100 given past cath results.   atorvastatin  10 mg p.o. daily.  Working on low-cholesterol diet, regular exercise and weight loss.

## 2024-02-15 NOTE — Assessment & Plan Note (Signed)
Chronic, resolved on weekly high-dose vitamin D supplementation.  She wishes to continue this weekly for now.

## 2024-02-15 NOTE — Assessment & Plan Note (Signed)
Chronic, stable control on no medication 

## 2024-02-15 NOTE — Patient Instructions (Addendum)
 Start wegovy  low dose.. call in 2 weeks for possible increase if tolerating.

## 2024-02-15 NOTE — Progress Notes (Signed)
 Patient ID: Hannah Green, female    DOB: 17-Mar-1945, 78 y.o.   MRN: [id number]  This visit was conducted in person.  BP 130/60   Pulse 77   Temp 97.7 F (36.5 C) (Temporal)   Ht 5' 6 (1.676 m)   Wt 237 lb 2 oz (107.6 kg)   LMP  (LMP Unknown)   SpO2 95%   BMI 38.27 kg/m    CC:  Chief Complaint  Patient presents with   Annual Exam    MWV scheduled for 03/29/2024    Subjective:   HPI: Hannah Green is a 78 y.o. female presenting on 02/15/2024 for Annual Exam (MWV scheduled for 03/29/2024)  The patient presents for  complete physical and review of chronic health problems. He/She also has the following acute concerns today: none   AMW scheduled 03/29/2024  Vit D def: Resolved on weekly vitamin D .    Elevated Cholesterol:  LDL at goal,  on atorvastatin  10 mg daily, no SE. Lab Results  Component Value Date   CHOL 212 (H) 02/07/2024   HDL 66.70 02/07/2024   LDLCALC 118 (H) 02/07/2024   LDLDIRECT 136.2 06/03/2011   TRIG 135.0 02/07/2024   CHOLHDL 3 02/07/2024   The 10-year ASCVD risk score (Arnett DK, et al., 2019) is: 22.9%   Values used to calculate the score:     Age: 30 years     Clinically relevant sex: Female     Is Non-Hispanic African American: No     Diabetic: No     Tobacco smoker: No     Systolic Blood Pressure: 130 mmHg     Is BP treated: No     HDL Cholesterol: 66.7 mg/dL     Total Cholesterol: 212 mg/dL Using medications without problems: Muscle aches:  none Diet compliance: moderate Exercise: Had to stop.. when son passed.. now restarting. Other complaints:    BMI 38 Wt Readings from Last 3 Encounters:  02/15/24 237 lb 2 oz (107.6 kg)  01/24/24 235 lb 3.2 oz (106.7 kg)  10/20/23 232 lb (105.2 kg)    RA:  Stable control on no medication.  HX of lung cancer, s/p  resection, Onc Dr. Virgene   COPD:   Poor control off Trelegy.. cannot afford.. was confused about meds.. clarified she should take both  Spiriva  and Advair.  Followed by  pulmonary  Dr. Brenna.  albuterol  prn.    Prediabetes  Lab Results  Component Value Date   HGBA1C 5.7 02/07/2024    Patient Care Team: [name], MD as PCP - General Anner Alm ORN, MD as PCP - Cardiology (Cardiology) Portia Fireman, OD as Consulting Physician (Optometry) Meridee Seltzer, MD as Consulting Physician (Ophthalmology) Milissa Hamming, MD as Referring Physician (Otolaryngology) Neysa Reggy BIRCH, MD as Consulting Physician (Pulmonary Disease)   Relevant past medical, surgical, family and social history reviewed and updated as indicated. Interim medical history since our last visit reviewed. Allergies and medications reviewed and updated. Outpatient Medications Prior to Visit  Medication Sig Dispense Refill   minoxidil  (LONITEN ) 2.5 MG tablet Take 1 tablet (2.5 mg total) by mouth daily. 90 tablet 4   albuterol  (VENTOLIN  HFA) 108 (90 Base) MCG/ACT inhaler Inhale 2 puffs into the lungs every 6 (six) hours as needed for wheezing or shortness of breath. 8 g 6   atorvastatin  (LIPITOR) 10 MG tablet TAKE 1 TABLET BY MOUTH EVERY DAY 100 tablet 2   Cholecalciferol (D3-50) 1.25 MG (50000 UT) capsule Take 1  capsule (50,000 Units total) by mouth once a week. 12 capsule 3   fluticasone -salmeterol (ADVAIR) 500-50 MCG/ACT AEPB Inhale 1 puff into the lungs in the morning and at bedtime. (Patient not taking: Reported on 02/15/2024) 60 each 6   Fluticasone -Umeclidin-Vilant (TRELEGY ELLIPTA ) 200-62.5-25 MCG/ACT AEPB Inhale 1 puff into the lungs daily. (Patient not taking: Reported on 02/15/2024)     Ivermectin  1 % CREA Apply 1 Application topically as directed. Apply to face qd to bid  for Rosacea 30 g 11   Tiotropium Bromide (SPIRIVA  RESPIMAT) 2.5 MCG/ACT AERS Inhale 2 puffs into the lungs daily. (Patient not taking: Reported on 02/15/2024) 1 g 6   albuterol  (VENTOLIN  HFA) 108 (90 Base) MCG/ACT inhaler Inhale 2 puffs into the lungs every 6 (six) hours as needed for wheezing or  shortness of breath. 8 g 2   azithromycin  (ZITHROMAX ) 250 MG tablet Take 1 tablet (250 mg total) by mouth daily. Take first 2 tablets together, then 1 every day until finished. (Patient not taking: Reported on 01/24/2024) 6 tablet 0   omeprazole  (PRILOSEC) 40 MG capsule Take 40 mg by mouth daily.     Facility-Administered Medications Prior to Visit  Medication Dose Route Frequency Provider Last Rate Last Admin   albuterol  (PROVENTIL ) (2.5 MG/3ML) 0.083% nebulizer solution 2.5 mg  2.5 mg Nebulization Q6H PRN          Per HPI unless specifically indicated in ROS section below Review of Systems  Constitutional:  Negative for fatigue and fever.  HENT:  Negative for congestion.   Eyes:  Negative for pain.  Respiratory:  Positive for shortness of breath. Negative for cough.   Cardiovascular:  Negative for chest pain, palpitations and leg swelling.  Gastrointestinal:  Negative for abdominal pain.  Genitourinary:  Negative for dysuria and vaginal bleeding.  Musculoskeletal:  Negative for back pain.  Neurological:  Negative for syncope, light-headedness and headaches.  Psychiatric/Behavioral:  Negative for dysphoric mood.    Objective:  BP 130/60   Pulse 77   Temp 97.7 F (36.5 C) (Temporal)   Ht 5' 6 (1.676 m)   Wt 237 lb 2 oz (107.6 kg)   LMP  (LMP Unknown)   SpO2 95%   BMI 38.27 kg/m   Wt Readings from Last 3 Encounters:  02/15/24 237 lb 2 oz (107.6 kg)  01/24/24 235 lb 3.2 oz (106.7 kg)  10/20/23 232 lb (105.2 kg)      Physical Exam Constitutional:      General: She is not in acute distress.    Appearance: Normal appearance. She is well-developed. She is not ill-appearing or toxic-appearing.  HENT:     Head: Normocephalic.     Right Ear: Hearing, tympanic membrane, ear canal and external ear normal. Tympanic membrane is not erythematous, retracted or bulging.     Left Ear: Hearing, tympanic membrane, ear canal and external ear normal. Tympanic membrane is not erythematous,  retracted or bulging.     Nose: No mucosal edema or rhinorrhea.     Right Sinus: No maxillary sinus tenderness or frontal sinus tenderness.     Left Sinus: No maxillary sinus tenderness or frontal sinus tenderness.     Mouth/Throat:     Pharynx: Uvula midline.  Eyes:     General: Lids are normal. Lids are everted, no foreign bodies appreciated.     Conjunctiva/sclera: Conjunctivae normal.     Pupils: Pupils are equal, round, and reactive to light.  Neck:     Thyroid : No thyroid  mass  or thyromegaly.     Vascular: No carotid bruit.     Trachea: Trachea normal.  Cardiovascular:     Rate and Rhythm: Normal rate and regular rhythm.     Pulses: Normal pulses.     Heart sounds: Normal heart sounds, S1 normal and S2 normal. No murmur heard.    No friction rub. No gallop.  Pulmonary:     Effort: Pulmonary effort is normal. No tachypnea or respiratory distress.     Breath sounds: Normal breath sounds. No decreased breath sounds, wheezing, rhonchi or rales.  Abdominal:     General: Bowel sounds are normal.     Palpations: Abdomen is soft.     Tenderness: There is no abdominal tenderness.  Musculoskeletal:     Cervical back: Normal range of motion and neck supple.  Skin:    General: Skin is warm and dry.     Findings: No rash.  Neurological:     Mental Status: She is alert.  Psychiatric:        Mood and Affect: Mood is not anxious or depressed.        Speech: Speech normal.        Behavior: Behavior normal. Behavior is cooperative.        Thought Content: Thought content normal.        Judgment: Judgment normal.       Results for orders placed or performed in visit on 02/07/24  VITAMIN D  25 Hydroxy (Vit-D Deficiency, Fractures)   Collection Time: 02/07/24  8:14 AM  Result Value Ref Range   VITD 31.47 30.00 - 100.00 ng/mL  Lipid panel   Collection Time: 02/07/24  8:14 AM  Result Value Ref Range   Cholesterol 212 (H) 0 - 200 mg/dL   Triglycerides 864.9 0.0 - 149.0 mg/dL   HDL  33.29 >60.99 mg/dL   VLDL 72.9 0.0 - 59.9 mg/dL   LDL Cholesterol 881 (H) 0 - 99 mg/dL   Total CHOL/HDL Ratio 3    NonHDL 145.18   Hemoglobin A1c   Collection Time: 02/07/24  8:14 AM  Result Value Ref Range   Hgb A1c MFr Bld 5.7 4.6 - 6.5 %  Comprehensive metabolic panel   Collection Time: 02/07/24  8:14 AM  Result Value Ref Range   Sodium 141 135 - 145 mEq/L   Potassium 4.2 3.5 - 5.1 mEq/L   Chloride 104 96 - 112 mEq/L   CO2 31 19 - 32 mEq/L   Glucose, Bld 116 (H) 70 - 99 mg/dL   BUN 17 6 - 23 mg/dL   Creatinine, Ser 9.32 0.40 - 1.20 mg/dL   Total Bilirubin 0.5 0.2 - 1.2 mg/dL   Alkaline Phosphatase 66 39 - 117 U/L   AST 18 0 - 37 U/L   ALT 18 0 - 35 U/L   Total Protein 6.3 6.0 - 8.3 g/dL   Albumin 4.0 3.5 - 5.2 g/dL   GFR 16.33 >39.99 mL/min   Calcium  9.4 8.4 - 10.5 mg/dL     COVID 19 screen:  No recent travel or known exposure to COVID19 The patient denies respiratory symptoms of COVID 19 at this time. The importance of social distancing was discussed today.   Assessment and Plan   The patient's preventative maintenance and recommended screening tests for an annual wellness exam were reviewed in full today. Brought up to date unless services declined.  Counselled on the importance of diet, exercise, and its role in overall health and mortality. The patient's  FH and SH was reviewed, including their home life, tobacco status, and drug and alcohol status.    Vaccines:  PCV 13, 23 uptodate.  COVID x3.  Due for shingles and tdap ...will consider. Mammo:  06/2023 Hx of breast CA, Last pap age 37, no further indicated,  No DVE , low risk, no family history of ovarian or uterine cancer. DEXA: 12/2015,  plan  04/2023 DUE.. plan next year. Colon: Last 02/2010 nml,  PER  Dr. Nola last note: 11/2022 negative , no further indicated colonoscopy Hep C: neg Former smoker, >40 years:  Hx of lung cancer  spirometry 2020 severe COPD followed by pulmonary, Dr. Neysa  Problem List  Items Addressed This Visit     COPD (chronic obstructive pulmonary disease) (HCC) (Chronic)   Chronic, stable control  Cannot afford  trelegy. Followed by pulmonary. Albuterol  prn and nebulizer.. she has not gotten the prescription for the  medication  Clarified medications in detail for her she is post to take both Spiriva  and Advair to equal similar profile as Trelegy.  I sent in her albuterol  nebulizer solution as it was sent incorrectly by pulmonary.      Relevant Medications   albuterol  (PROVENTIL ) (2.5 MG/3ML) 0.083% nebulizer solution   History of lung cancer   Status postresection, oncologist is Dr. Virgene.        Hyperlipidemia with target LDL less than 100 (Chronic)    Chronic worsened control  Per cards  LDL goal just less than 100 given past cath results.   atorvastatin  10 mg p.o. daily.  Working on low-cholesterol diet, regular exercise and weight loss.      Morbid obesity (HCC)    BMI 38 and mulitple comorbidities including prediabetes, HTN, high cholesterol.  Discussed weight loss options in detail.  She is interested in a trial of Wegovy  self-pay through her pharmacy. We reviewed possible side effects and benefits.  She will start at 0.25 mg weekly and give me a 2-week update for possible increase.  She will follow-up in 4 weeks for reevaluation.      Relevant Medications   semaglutide -weight management (WEGOVY ) 0.25 MG/0.5ML SOAJ SQ injection   Prediabetes (Chronic)   Chronic, stable control. Encouraged exercise, weight loss, healthy eating habits.       Rheumatoid arthritis (HCC) (Chronic)   Chronic, stable control on no medication      Vitamin D  deficiency (Chronic)   Chronic, resolved on weekly high-dose vitamin D  supplementation.  She wishes to continue this weekly for now.      Other Visit Diagnoses       Routine general medical examination at a health care facility    -  Primary          Greig Ring, MD

## 2024-02-15 NOTE — Assessment & Plan Note (Addendum)
 BMI 38 and mulitple comorbidities including prediabetes, HTN, high cholesterol.  Discussed weight loss options in detail.  She is interested in a trial of Wegovy  self-pay through her pharmacy. We reviewed possible side effects and benefits.  She will start at 0.25 mg weekly and give me a 2-week update for possible increase.  She will follow-up in 4 weeks for reevaluation.

## 2024-02-28 ENCOUNTER — Other Ambulatory Visit: Payer: Self-pay | Admitting: Family Medicine

## 2024-03-14 ENCOUNTER — Ambulatory Visit: Admitting: Family Medicine

## 2024-03-29 ENCOUNTER — Ambulatory Visit

## 2024-04-02 ENCOUNTER — Ambulatory Visit: Admitting: Dermatology

## 2024-04-05 ENCOUNTER — Ambulatory Visit
Admission: EM | Admit: 2024-04-05 | Discharge: 2024-04-05 | Disposition: A | Discharge status: Home / Self Care | Source: Home / Self Care

## 2024-04-05 ENCOUNTER — Ambulatory Visit: Payer: Self-pay | Admitting: *Deleted

## 2024-04-05 DIAGNOSIS — S61412A Laceration without foreign body of left hand, initial encounter: Secondary | ICD-10-CM

## 2024-04-05 MED ORDER — TETANUS-DIPHTH-ACELL PERTUSSIS 5-2-15.5 LF-MCG/0.5 IM SUSP
0.5000 mL | Freq: Once | INTRAMUSCULAR | Status: AC
  Administered 2024-04-05: 0.5 mL via INTRAMUSCULAR

## 2024-04-05 MED ORDER — DOXYCYCLINE HYCLATE 100 MG PO CAPS: 100.0000 mg | ORAL_CAPSULE | Freq: Two times a day (BID) | ORAL | 0 refills | Status: AC

## 2024-04-05 NOTE — Telephone Encounter (Signed)
 Noted.

## 2024-04-05 NOTE — ED Triage Notes (Signed)
 Pt state she was cutting something at home and cut her left hand with the point of her scissors today.   Last tdap was 2013.

## 2024-04-05 NOTE — ED Provider Notes (Signed)
 " Hannah Green    CSN: [id number] Arrival date & time: 04/05/24  1346      History   Chief Complaint Chief Complaint  Patient presents with   Laceration    HPI Hannah Green is a 79 y.o. female.  Patient presents with a laceration on her left palm that occurred today when she accidentally cut herself with a kitchen knife while cutting up food.  She cleaned the wound and put pressure on it to stop the bleeding.  No numbness or weakness.  Last tetanus 2013.  The history is provided by the patient and medical records.    Past Medical History:  Diagnosis Date   Breast cancer (HCC) 1999   Cataract 2017   resolved with surgery   COPD (chronic obstructive pulmonary disease) (HCC)    Coronary artery disease    Dysplastic nevus 09/04/2012   R prox calf - mild    GERD (gastroesophageal reflux disease)    Hyperlipidemia    Postoperative atrial fibrillation (HCC) 03/2018   Rheumatoid arthritis(714.0)    Skin cancer    face, txted in past   Squamous cell carcinoma of lung, stage I, right (HCC) 2020   Status post right upper and middle lobectomy    Patient Active Problem List   Diagnosis Date Noted   Morbid obesity (HCC) 12/06/2022   Stage I squamous cell carcinoma of right lung (HCC) 06/12/2018   History of lung cancer 04/24/2018   S/P Right Upper Lobectomy, Right Middle Lobectomy Lung 04/23/2018   Prediabetes 01/08/2018   COPD (chronic obstructive pulmonary disease) (HCC) 12/07/2016   Diastolic dysfunction without heart failure 02/01/2016   Osteopenia of femoral neck 01/12/2016   Rosacea 12/22/2015   Basal cell carcinoma of left side of nose 06/01/2012   Vitamin D  deficiency 02/13/2008   ADENOCARCINOMA, BREAST 02/08/2008   Hyperlipidemia with target LDL less than 100 02/08/2008   Rheumatoid arthritis (HCC) 02/08/2008    Past Surgical History:  Procedure Laterality Date   BREAST LUMPECTOMY  1999   left, needed radiation   CATARACT EXTRACTION W/ INTRAOCULAR  LENS  IMPLANT, BILATERAL Bilateral 05/19/15, 06/02/15   CHEST TUBE INSERTION Right 04/23/2018   Procedure: Right Lateral Chest Tube Insertion;  Surgeon: Kerrin Elspeth BROCKS, MD;  Location: [facility] OR;  Service: Thoracic;  Laterality: Right;   CHOLECYSTECTOMY     COLONOSCOPY WITH PROPOFOL  N/A 12/02/2022   Procedure: COLONOSCOPY WITH PROPOFOL ;  Surgeon: Rollin Dover, MD;  Location: [facility];  Service: Gastroenterology;  Laterality: N/A;   ESOPHAGOGASTRODUODENOSCOPY (EGD) WITH PROPOFOL  N/A 06/24/2022   Procedure: ESOPHAGOGASTRODUODENOSCOPY (EGD) WITH PROPOFOL ;  Surgeon: Rollin Dover, MD;  Location: [facility];  Service: Gastroenterology;  Laterality: N/A;   LOBECTOMY Right 04/23/2018   Procedure: RIGHT UPPER LOBECTOMY;  Surgeon: Kerrin Elspeth BROCKS, MD;  Location: [facility] OR;  Service: Thoracic;  Laterality: Right;   NM MYOVIEW  LTD  01/2016   LOW RISK. Hypertensive response to exercise followed by significant drop (stage I pressure 211/70 drop to 118/75. Stage II). Apical artifact but otherwise no ischemia or infarction. EF 64%.   NODE DISSECTION Right 04/23/2018   Procedure: MEDIASTINAL NODE DISSECTION;  Surgeon: Kerrin Elspeth BROCKS, MD;  Location: [facility] OR;  Service: Thoracic;  Laterality: Right;   POLYPECTOMY  12/02/2022   Procedure: POLYPECTOMY;  Surgeon: Rollin Dover, MD;  Location: [facility];  Service: Gastroenterology;;   RIGHT/LEFT HEART CATH AND CORONARY ANGIOGRAPHY N/A 04/10/2018   Procedure: RIGHT/LEFT HEART CATH AND CORONARY ANGIOGRAPHY;  Surgeon: Anner Alm ORN, MD;  Location: [facility];; angiographically minimal coronary artery disease.  Normal LVEDP.  Basely normal RHC pressures with no suggestion of pulmonary hypertension.  Normal CO-CI by Fick (5.9-2.8)   SAVORY DILATION N/A 06/24/2022   Procedure: SAVORY DILATION;  Surgeon: Rollin Dover, MD;  Location: [facility];  Service: Gastroenterology;  Laterality: N/A;   TONSILLECTOMY     TRANSTHORACIC ECHOCARDIOGRAM  11/'17; 2/'20    a) Nov 2017: Mild LVH. EF 60-65%. GR 2-DD; b) Feb 2020: Normal LV size and function.  EF 60 to 65%.  No R WMA.  GR 1 DD.  Normal RV size and function.  Normal pressures.  No valvular disease.   VIDEO ASSISTED THORACOSCOPY (VATS)/WEDGE RESECTION Right 04/23/2018   Procedure: VIDEO ASSISTED THORACOSCOPY (VATS)/WEDGE RESECTION OF RIGHT UPPER LUNG;  Surgeon: Kerrin Elspeth BROCKS, MD;  Location: [facility] OR;  Service: Thoracic;  Laterality: Right;    OB History   No obstetric history on file.      Home Medications    Prior to Admission medications  Medication Sig Start Date End Date Taking? Authorizing Provider  albuterol  (VENTOLIN  HFA) 108 (90 Base) MCG/ACT inhaler Inhale 2 puffs into the lungs every 6 (six) hours as needed for wheezing or shortness of breath. 01/24/24  Yes Adrien Winfred Berke, MD  atorvastatin  (LIPITOR) 10 MG tablet TAKE 1 TABLET BY MOUTH EVERY DAY 02/28/24  Yes Bedsole, Amy E, MD  Cholecalciferol (D3-50) 1.25 MG (50000 UT) capsule Take 1 capsule (50,000 Units total) by mouth once a week. 02/09/23  Yes Bedsole, Amy E, MD  doxycycline  (VIBRAMYCIN ) 100 MG capsule Take 1 capsule (100 mg total) by mouth 2 (two) times daily for 5 days. 04/05/24 04/10/24 Yes Corlis Burnard DEL, NP  minoxidil  (LONITEN ) 2.5 MG tablet Take 1 tablet (2.5 mg total) by mouth daily. 07/13/23  Yes Hester Alm BROCKS, MD  omeprazole  (PRILOSEC) 40 MG capsule Take 1 capsule (40 mg total) by mouth daily. 02/15/24  Yes Bedsole, Amy E, MD  Tiotropium Bromide (SPIRIVA  RESPIMAT) 2.5 MCG/ACT AERS Inhale 2 puffs into the lungs daily. 01/24/24  Yes Adrien Winfred Berke, MD  albuterol  (PROVENTIL ) (2.5 MG/3ML) 0.083% nebulizer solution Take 3 mLs (2.5 mg total) by nebulization every 6 (six) hours as needed for wheezing or shortness of breath. 02/15/24   Bedsole, Amy E, MD  fluticasone -salmeterol (ADVAIR) 500-50 MCG/ACT AEPB Inhale 1 puff into the lungs in the morning and at bedtime. Patient not taking: Reported on 02/15/2024  01/24/24 02/23/24  Adrien Winfred Berke, MD  Fluticasone -Umeclidin-Vilant (TRELEGY ELLIPTA ) 200-62.5-25 MCG/ACT AEPB Inhale 1 puff into the lungs daily. Patient not taking: Reported on 02/15/2024 01/24/24   Adrien Winfred, Berke, MD  Ivermectin  1 % CREA Apply 1 Application topically as directed. Apply to face qd to bid  for Rosacea 07/12/22   Hester Alm BROCKS, MD  semaglutide -weight management (WEGOVY ) 0.25 MG/0.5ML SOAJ SQ injection Inject 0.25 mg into the skin once a week. SELF PAY, Do not run insurance Patient not taking: Reported on 04/05/2024 02/15/24   [name], MD    Family History Family History  Problem Relation Age of Onset   Cancer Mother        breast   Cancer Father        lung cancer   Cancer Sister        throat cancer   Diabetes Paternal Uncle    Diabetes Maternal Grandmother     Social History Social History[1]   Allergies   Adhesive [tape], Neosporin [neomycin-bacitracin zn-polymyx], and  Penicillins   Review of Systems Review of Systems  Constitutional:  Negative for chills and fever.  Musculoskeletal:  Negative for arthralgias and joint swelling.  Skin:  Positive for wound. Negative for color change.  Neurological:  Negative for weakness and numbness.     Physical Exam Triage Vital Signs ED Triage Vitals  Encounter Vitals Group     BP      Girls Systolic BP Percentile      Girls Diastolic BP Percentile      Boys Systolic BP Percentile      Boys Diastolic BP Percentile      Pulse      Resp      Temp      Temp src      SpO2      Weight      Height      Head Circumference      Peak Flow      Pain Score      Pain Loc      Pain Education      Exclude from Growth Chart    No data found.  Updated Vital Signs BP 136/71 (BP Location: Right Arm)   Pulse 71   Temp 97.8 F (36.6 C) (Oral)   Resp 18   LMP  (LMP Unknown)   SpO2 95%   Visual Acuity Right Eye Distance:   Left Eye Distance:   Bilateral Distance:    Right Eye  Near:   Left Eye Near:    Bilateral Near:     Physical Exam Constitutional:      General: She is not in acute distress. HENT:     Mouth/Throat:     Mouth: Mucous membranes are moist.  Cardiovascular:     Rate and Rhythm: Normal rate.  Pulmonary:     Effort: Pulmonary effort is normal. No respiratory distress.  Musculoskeletal:        General: No deformity. Normal range of motion.  Skin:    General: Skin is warm and dry.     Capillary Refill: Capillary refill takes less than 2 seconds.     Findings: Lesion present.     Comments: 2 cm laceration on left palm.  Bleeding controlled.  Neurological:     General: No focal deficit present.     Mental Status: She is alert.     Sensory: No sensory deficit.     Motor: No weakness.      UC Treatments / Results  Labs (all labs ordered are listed, but only abnormal results are displayed) Labs Reviewed - No data to display  EKG   Radiology No results found.  Procedures Laceration Repair  Date/Time: 04/05/2024 3:22 PM  Performed by: Corlis Burnard DEL, NP Authorized by: Corlis Burnard DEL, NP   Consent:    Consent obtained:  Verbal   Consent given by:  Patient   Risks discussed:  Infection, pain, poor cosmetic result and poor wound healing Universal protocol:    Procedure explained and questions answered to patient or proxy's satisfaction: yes   Anesthesia:    Anesthesia method:  Local infiltration   Local anesthetic:  Lidocaine  1% w/o epi Laceration details:    Location:  Hand   Hand location:  L palm   Length (cm):  2   Depth (mm):  3 Pre-procedure details:    Preparation:  Patient was prepped and draped in usual sterile fashion Exploration:    Hemostasis achieved with:  Direct pressure   Imaging  outcome: foreign body not noted     Wound exploration: wound explored through full range of motion and entire depth of wound visualized   Treatment:    Area cleansed with:  Povidone-iodine   Amount of cleaning:  Standard    Irrigation solution:  Sterile water   Irrigation method:  Syringe   Visualized foreign bodies/material removed: no   Skin repair:    Repair method:  Sutures   Suture size:  4-0   Suture material:  Nylon   Suture technique:  Simple interrupted   Number of sutures:  5 Approximation:    Approximation:  Close Repair type:    Repair type:  Simple Post-procedure details:    Dressing:  Antibiotic ointment and non-adherent dressing   Procedure completion:  Tolerated well, no immediate complications  (including critical care time)  Medications Ordered in UC Medications  Tdap (ADACEL) injection 0.5 mL (has no administration in time range)    Initial Impression / Assessment and Plan / UC Course  I have reviewed the triage vital signs and the nursing notes.  Pertinent labs & imaging results that were available during my care of the patient were reviewed by me and considered in my medical decision making (see chart for details).    Laceration of left palm.  5 sutures.  Tetanus updated.  Treating prophylactically with 5-day course of doxycycline .  Wound care instructions and signs of infection discussed.  Instructed patient to return here for suture removal in 7 to 10 days.  Instructed her to follow-up right away if she notes signs of infection.  Education provided on laceration care.  Patient agrees to plan of care.  Final Clinical Impressions(s) / UC Diagnoses   Final diagnoses:  Laceration of left palm, initial encounter     Discharge Instructions      Keep your wound clean and dry.  Wash it gently twice a day with soap and water.  Then apply an antibiotic ointment and nonstick dressing.  See the attached information on laceration care.    Take the doxycycline  as directed to prevent infection.    Your tetanus was updated today.   Your stitches need to be taken out in 7 to 10 days.    Follow-up right away if you note signs of infection such as redness, pus, fever.     ED  Prescriptions     Medication Sig Dispense Auth. Provider   doxycycline  (VIBRAMYCIN ) 100 MG capsule Take 1 capsule (100 mg total) by mouth 2 (two) times daily for 5 days. 10 capsule Corlis Burnard DEL, NP      PDMP not reviewed this encounter.     [1]  Social History Tobacco Use   Smoking status: Former    Current packs/day: 0.00    Average packs/day: 1.5 packs/day for 50.0 years (75.0 ttl pk-yrs)    Types: Cigarettes    Start date: 03/31/1961    Quit date: 04/01/2011    Years since quitting: 13.0   Smokeless tobacco: Never   Tobacco comments:    has stopped but has started again in crisis  Vaping Use   Vaping status: Never Used  Substance Use Topics   Alcohol use: Yes    Alcohol/week: 2.0 standard drinks of alcohol    Types: 2 Glasses of wine per week    Comment: monthly   Drug use: No     Corlis Burnard DEL, NP 04/05/24 1525  "

## 2024-04-05 NOTE — Discharge Instructions (Addendum)
 Keep your wound clean and dry.  Wash it gently twice a day with soap and water.  Then apply an antibiotic ointment and nonstick dressing.  See the attached information on laceration care.    Take the doxycycline  as directed to prevent infection.    Your tetanus was updated today.   Your stitches need to be taken out in 7 to 10 days.    Follow-up right away if you note signs of infection such as redness, pus, fever.

## 2024-04-05 NOTE — Telephone Encounter (Signed)
 FYI Only or Action Required?: FYI only for provider: ED advised and patient refused may go to UC instead .  Patient was last seen in primary care on 02/15/2024 by [name], MD.  Called Nurse Triage reporting Laceration.  Symptoms began today.  Interventions attempted: Other: direct pressure to slow down bleeding and clean site with peroxide.  Symptoms are: gradually worsening.  Triage Disposition: Go to ED Now (Notify PCP)  Patient/caregiver understands and will follow disposition?: No, wishes to speak with PCP   CAL Amy notified patient refused ED might go to UC. Requesting if PCP will give tetanus shot today.                  Reason for Disposition  Skin is split open or gaping (or length > 1/2 inch or 12 mm on the skin, 1/4 inch or 6 mm on the face)  Answer Assessment - Initial Assessment Questions Recommended ED. Patient reports she is not going to [facility] ED and is not driving to [place]. Recommended closets UC. Patient reports she only wants tetanus shot and will come to office on Monday . Recommended wound needs evaluation today . Please advise.      1. APPEARANCE of INJURY: What does the injury look like?      Deep cut to fatty area of palm of left hand with sharpend of knife  2. ONSET: How long ago did the injury occur?      1 hour  3. LOCATION: Where is the injury located?      Palm left hand 4. SIZE: How large is the cut?       1/2 inch 5. BLEEDING: Is it bleeding now? If Yes, ask: Is it difficult to stop?      Minimal bleeding now , most bleeding has stopped.  6. PAIN: Is there any pain? If Yes, ask: How bad is the pain? (Scale 0-10; or none, mild, moderate, severe)     Did not report pain 7. MECHANISM: Tell me how it happened.       Cutting with knife and cut palm side of left hand with pointed area of knife 8. TETANUS: When was your last tetanus booster?     Patient report 2013  Protocols used: Cuts and  [employer]

## 2024-04-05 NOTE — Telephone Encounter (Signed)
 Per chart review tab pt is presently at [facility]. Sending to Dr Avelina as FYI and Bedsole pool.

## 2024-04-12 ENCOUNTER — Ambulatory Visit

## 2024-04-24 ENCOUNTER — Ambulatory Visit: Admitting: Dermatology

## 2024-05-08 ENCOUNTER — Ambulatory Visit

## 2024-05-08 ENCOUNTER — Encounter

## 2024-07-18 ENCOUNTER — Ambulatory Visit
